# Patient Record
Sex: Male | Born: 1950 | Race: White | Hispanic: No | Marital: Married | Smoking: Former smoker
Health system: Southern US, Community
[De-identification: ages and names within clinical notes are randomized; demographics above are authoritative.]

## PROBLEM LIST (undated history)

## (undated) DIAGNOSIS — G4730 Sleep apnea, unspecified: Secondary | ICD-10-CM

## (undated) DIAGNOSIS — I10 Essential (primary) hypertension: Secondary | ICD-10-CM

## (undated) DIAGNOSIS — J449 Chronic obstructive pulmonary disease, unspecified: Secondary | ICD-10-CM

## (undated) DIAGNOSIS — R011 Cardiac murmur, unspecified: Secondary | ICD-10-CM

## (undated) DIAGNOSIS — K759 Inflammatory liver disease, unspecified: Secondary | ICD-10-CM

## (undated) DIAGNOSIS — N186 End stage renal disease: Secondary | ICD-10-CM

## (undated) DIAGNOSIS — E079 Disorder of thyroid, unspecified: Secondary | ICD-10-CM

## (undated) DIAGNOSIS — C3490 Malignant neoplasm of unspecified part of unspecified bronchus or lung: Secondary | ICD-10-CM

## (undated) DIAGNOSIS — G629 Polyneuropathy, unspecified: Secondary | ICD-10-CM

## (undated) DIAGNOSIS — E039 Hypothyroidism, unspecified: Secondary | ICD-10-CM

## (undated) HISTORY — PX: ANKLE FRACTURE SURGERY: SHX122

## (undated) HISTORY — DX: Essential (primary) hypertension: I10

## (undated) HISTORY — PX: OTHER SURGICAL HISTORY: SHX169

## (undated) HISTORY — PX: VASECTOMY: SHX75

## (undated) HISTORY — DX: Disorder of thyroid, unspecified: E07.9

## (undated) HISTORY — DX: Malignant neoplasm of unspecified part of unspecified bronchus or lung: C34.90

---

## 1994-08-11 LAB — HM COLONOSCOPY: HM COLON: NORMAL

## 2006-01-10 HISTORY — PX: LUNG LOBECTOMY: SHX167

## 2007-03-15 ENCOUNTER — Encounter: Admission: RE | Admit: 2007-03-15 | Discharge: 2007-03-15 | Payer: Self-pay | Admitting: Rheumatology

## 2007-03-16 ENCOUNTER — Ambulatory Visit: Payer: Self-pay | Admitting: Oncology

## 2009-05-19 ENCOUNTER — Encounter: Admission: RE | Admit: 2009-05-19 | Discharge: 2009-05-19 | Payer: Self-pay | Admitting: Family Medicine

## 2009-06-01 ENCOUNTER — Encounter: Admission: RE | Admit: 2009-06-01 | Discharge: 2009-06-01 | Payer: Self-pay | Admitting: Family Medicine

## 2010-01-31 ENCOUNTER — Encounter: Payer: Self-pay | Admitting: Rheumatology

## 2010-01-31 ENCOUNTER — Encounter: Payer: Self-pay | Admitting: Family Medicine

## 2012-12-18 LAB — CBC AND DIFFERENTIAL: WBC: 7.8 10^3/mL

## 2013-01-07 LAB — LIPID PANEL
Cholesterol: 240 mg/dL — AB (ref 0–200)
HDL: 80 mg/dL — AB (ref 35–70)
LDL Cholesterol: 134 mg/dL
Triglycerides: 99 mg/dL (ref 40–160)

## 2013-01-07 LAB — CBC AND DIFFERENTIAL
HEMOGLOBIN: 13.9 g/dL (ref 13.5–17.5)
PLATELETS: 236 10*3/uL (ref 150–399)

## 2013-04-01 LAB — BASIC METABOLIC PANEL
CREATININE: 1.4 mg/dL — AB (ref 0.6–1.3)
GLUCOSE: 101 mg/dL
POTASSIUM: 4.5 mmol/L (ref 3.4–5.3)
Sodium: 137 mmol/L (ref 137–147)

## 2013-04-01 LAB — HEPATIC FUNCTION PANEL
ALK PHOS: 44 U/L (ref 25–125)
ALT: 9 U/L — AB (ref 10–40)
AST: 13 U/L — AB (ref 14–40)

## 2013-04-01 LAB — VITAMIN B12

## 2013-04-01 LAB — TSH: TSH: 0.72 u[IU]/mL (ref 0.41–5.90)

## 2014-08-11 ENCOUNTER — Ambulatory Visit (INDEPENDENT_AMBULATORY_CARE_PROVIDER_SITE_OTHER): Payer: Commercial Managed Care - PPO | Admitting: Family Medicine

## 2014-08-11 ENCOUNTER — Encounter: Payer: Self-pay | Admitting: Family Medicine

## 2014-08-11 VITALS — BP 150/81 | HR 84 | Ht 69.5 in | Wt 204.0 lb

## 2014-08-11 DIAGNOSIS — C3491 Malignant neoplasm of unspecified part of right bronchus or lung: Secondary | ICD-10-CM | POA: Insufficient documentation

## 2014-08-11 DIAGNOSIS — G629 Polyneuropathy, unspecified: Secondary | ICD-10-CM | POA: Diagnosis not present

## 2014-08-11 DIAGNOSIS — E039 Hypothyroidism, unspecified: Secondary | ICD-10-CM

## 2014-08-11 DIAGNOSIS — I10 Essential (primary) hypertension: Secondary | ICD-10-CM

## 2014-08-11 DIAGNOSIS — E785 Hyperlipidemia, unspecified: Secondary | ICD-10-CM

## 2014-08-11 DIAGNOSIS — C3490 Malignant neoplasm of unspecified part of unspecified bronchus or lung: Secondary | ICD-10-CM | POA: Insufficient documentation

## 2014-08-11 MED ORDER — HYDROCODONE-ACETAMINOPHEN 10-325 MG PO TABS: 1.0000 | ORAL_TABLET | Freq: Three times a day (TID) | ORAL | Status: DC | PRN

## 2014-08-11 MED ORDER — LEVOTHYROXINE SODIUM 200 MCG PO TABS: 200.0000 ug | ORAL_TABLET | Freq: Every day | ORAL | Status: DC

## 2014-08-11 MED ORDER — VALSARTAN-HYDROCHLOROTHIAZIDE 160-25 MG PO TABS: 1.0000 | ORAL_TABLET | Freq: Every day | ORAL | Status: DC

## 2014-08-11 MED ORDER — DICLOFENAC SODIUM 75 MG PO TBEC: DELAYED_RELEASE_TABLET | ORAL | Status: DC

## 2014-08-11 NOTE — Progress Notes (Signed)
CC: Walter Wilkerson is a 64 y.o. male is here for Establish Care and Medication Refill   Subjective: HPI:  Very pleasant 64 year old here to establish care father of [name], husband of [name].  In 2008 he had right-sided non-small cell lung cancer that resulted in two thirds of his right lung removed along with chemotherapy. He denies any chronic respiratory complaints since that time. Ever since having chemotherapy he suffers from chronic joint pain. Symptoms are greatly improved and overall nonexistent provided he takes Norco 3 times a day. For breakthrough pain he also takes diclofenac. Denies any swelling redness or warmth of any joints.  History essential hypertension, recently started on valsartan originally was only on HCTZ. He's been on this for about 6 months now. Reports of outside blood pressures have been normal over the last 3 months. 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 He describes it as burning joint pain that is not currently interfering with quality of life given his current pain medication regimen.  History of hyperlipidemia area he believes cholesterol was checked 3 months ago and was normal.   Review of Systems - General ROS: negative for - chills, fever, night sweats, weight gain or weight loss Ophthalmic ROS: negative for - decreased vision Psychological ROS: negative for - anxiety or depression ENT ROS: negative for - hearing change, nasal congestion, tinnitus or allergies Hematological and Lymphatic ROS: negative for - bleeding problems, bruising or swollen lymph nodes Breast ROS: negative Respiratory ROS: no cough, shortness of breath, or wheezing Cardiovascular ROS: no chest pain or dyspnea on exertion Gastrointestinal ROS: no abdominal pain, change in bowel habits, or black or bloody stools Genito-Urinary ROS: negative for - genital discharge, genital ulcers, incontinence or abnormal bleeding  from genitals Musculoskeletal ROS: negative for - muscle pain Neurological ROS: negative for - headaches or memory loss Dermatological ROS: negative for lumps, mole changes, rash and skin lesion changes  Past Medical History  Diagnosis Date  . Hypertension   . Thyroid disease   . Lung cancer     Past Surgical History  Procedure Laterality Date  . Vasectomy     Family History  Problem Relation Age of Onset  . Heart attack Father   . Diabetes      grandmother   . Hyperlipidemia Father   . Hypertension Father     History   Social History  . Marital Status: Married    Spouse Name: N/A  . Number of Children: N/A  . Years of Education: N/A   Occupational History  . Not on file.   Social History Main Topics  . Smoking status: Former Smoker    Quit date: 08/10/2004  . Smokeless tobacco: Not on file  . Alcohol Use: 3.0 oz/week    5 Standard drinks or equivalent per week  . Drug Use: No  . Sexual Activity:    Partners: Female   Other Topics Concern  . Not on file   Social History Narrative  . No narrative on file     Objective: BP 150/81 mmHg  Pulse 84  Ht 5' 9.5" (1.765 m)  Wt 204 lb (92.534 kg)  BMI 29.70 kg/m2  General: Alert and Oriented, No Acute Distress HEENT: Pupils equal, round, reactive to light. Conjunctivae clear.  Moist membranes pharynx unremarkable Lungs: Clear to auscultation bilaterally, no wheezing/ronchi/rales.  Comfortable work of breathing. Good air movement. Cardiac: Regular rate and rhythm. Normal S1/S2.  No murmurs, rubs, nor  gallops.   Extremities: No peripheral edema.  Strong peripheral pulses.  Mental Status: No depression, anxiety, nor agitation. Skin: Warm and dry.  Assessment & Plan: Cori was seen today for establish care and medication refill.  Diagnoses and all orders for this visit:  Hypothyroidism, unspecified hypothyroidism type  Essential hypertension  Hyperlipidemia  Peripheral neuropathy  Non-small cell lung  cancer, unspecified laterality  Other orders -     valsartan-hydrochlorothiazide (DIOVAN-HCT) 160-25 MG per tablet; Take 1 tablet by mouth daily. -     levothyroxine (SYNTHROID, LEVOTHROID) 200 MCG tablet; Take 1 tablet (200 mcg total) by mouth daily. -     HYDROcodone-acetaminophen (NORCO) 10-325 MG per tablet; Take 1 tablet by mouth every 8 (eight) hours as needed. -     diclofenac (VOLTAREN) 75 MG EC tablet; TAKE ONE TABLET BY MOUTH 2 TIMES A DAY   Hypothyroidism: Clinical controlled and TSH was reportedly normal 3 months ago therefore continue levothyroxine will recheck in 3 months Essential hypertension: Uncontrolled chronic condition, Continue valsartan-HCTZ, discussed dietary and exercise interventions to help lower blood pressure. Will adjust medications at future visits if this remains elevated. Hyperlipidemia: Repeat lipid panel in 3 months Non-small cell lung cancer: Appears to be in remission, requesting outside records of most recent x-ray he believes it been within the last year Peripheral neuropathy and arthritis: Controlled with hydrocodone and diclofenac.  Return in about 3 months (around 11/11/2014) for Blood work and HTN.

## 2014-10-29 ENCOUNTER — Telehealth: Payer: Self-pay

## 2014-10-29 DIAGNOSIS — C3490 Malignant neoplasm of unspecified part of unspecified bronchus or lung: Secondary | ICD-10-CM

## 2014-10-29 DIAGNOSIS — E039 Hypothyroidism, unspecified: Secondary | ICD-10-CM

## 2014-10-29 DIAGNOSIS — I10 Essential (primary) hypertension: Secondary | ICD-10-CM

## 2014-10-29 DIAGNOSIS — E785 Hyperlipidemia, unspecified: Secondary | ICD-10-CM

## 2014-10-29 NOTE — Telephone Encounter (Signed)
Patient would like lab work before next appointment. What labs would you like patient to have?

## 2014-10-29 NOTE — Telephone Encounter (Signed)
[place], Fasting lab orders are placed in your in box.

## 2014-10-30 NOTE — Telephone Encounter (Signed)
Patient advised.

## 2014-10-31 ENCOUNTER — Encounter: Payer: Self-pay | Admitting: Family Medicine

## 2014-11-04 LAB — COMPREHENSIVE METABOLIC PANEL
ALBUMIN: 4 g/dL (ref 3.6–5.1)
ALK PHOS: 41 U/L (ref 40–115)
ALT: 14 U/L (ref 9–46)
AST: 14 U/L (ref 10–35)
BUN: 17 mg/dL (ref 7–25)
CHLORIDE: 101 mmol/L (ref 98–110)
CO2: 25 mmol/L (ref 20–31)
CREATININE: 0.97 mg/dL (ref 0.70–1.25)
Calcium: 9.5 mg/dL (ref 8.6–10.3)
Glucose, Bld: 91 mg/dL (ref 65–99)
POTASSIUM: 4.2 mmol/L (ref 3.5–5.3)
SODIUM: 136 mmol/L (ref 135–146)
TOTAL PROTEIN: 7.3 g/dL (ref 6.1–8.1)
Total Bilirubin: 0.5 mg/dL (ref 0.2–1.2)

## 2014-11-04 LAB — CBC
HEMATOCRIT: 40.5 % (ref 39.0–52.0)
Hemoglobin: 13.1 g/dL (ref 13.0–17.0)
MCH: 30 pg (ref 26.0–34.0)
MCHC: 32.3 g/dL (ref 30.0–36.0)
MCV: 92.9 fL (ref 78.0–100.0)
MPV: 10.7 fL (ref 8.6–12.4)
PLATELETS: 233 10*3/uL (ref 150–400)
RBC: 4.36 MIL/uL (ref 4.22–5.81)
RDW: 14.6 % (ref 11.5–15.5)
WBC: 6.1 10*3/uL (ref 4.0–10.5)

## 2014-11-04 LAB — LIPID PANEL
Cholesterol: 225 mg/dL — ABNORMAL HIGH (ref 125–200)
HDL: 93 mg/dL (ref >=40)
LDL Cholesterol: 118 mg/dL (ref <130)
TRIGLYCERIDES: 68 mg/dL (ref <150)
Total CHOL/HDL Ratio: 2.4 Ratio (ref <=5.0)
VLDL: 14 mg/dL (ref <30)

## 2014-11-04 LAB — TSH: TSH: 0.889 u[IU]/mL (ref 0.350–4.500)

## 2014-11-11 ENCOUNTER — Encounter: Payer: Self-pay | Admitting: Family Medicine

## 2014-11-11 ENCOUNTER — Ambulatory Visit (INDEPENDENT_AMBULATORY_CARE_PROVIDER_SITE_OTHER): Payer: Commercial Managed Care - PPO | Admitting: Family Medicine

## 2014-11-11 VITALS — BP 138/87 | HR 79 | Temp 98.0°F | Wt 209.0 lb

## 2014-11-11 DIAGNOSIS — M79643 Pain in unspecified hand: Secondary | ICD-10-CM | POA: Diagnosis not present

## 2014-11-11 DIAGNOSIS — Z23 Encounter for immunization: Secondary | ICD-10-CM | POA: Diagnosis not present

## 2014-11-11 DIAGNOSIS — E039 Hypothyroidism, unspecified: Secondary | ICD-10-CM

## 2014-11-11 DIAGNOSIS — E785 Hyperlipidemia, unspecified: Secondary | ICD-10-CM

## 2014-11-11 DIAGNOSIS — I10 Essential (primary) hypertension: Secondary | ICD-10-CM | POA: Diagnosis not present

## 2014-11-11 MED ORDER — LEVOTHYROXINE SODIUM 200 MCG PO TABS: 200.0000 ug | ORAL_TABLET | Freq: Every day | ORAL | Status: DC

## 2014-11-11 MED ORDER — VALSARTAN-HYDROCHLOROTHIAZIDE 160-25 MG PO TABS: 1.0000 | ORAL_TABLET | Freq: Every day | ORAL | Status: DC

## 2014-11-11 MED ORDER — HYDROCODONE-ACETAMINOPHEN 10-325 MG PO TABS: 1.0000 | ORAL_TABLET | Freq: Three times a day (TID) | ORAL | Status: DC | PRN

## 2014-11-11 NOTE — Progress Notes (Signed)
CC: Walter Wilkerson is a 64 y.o. male is here for Follow-up   Subjective: HPI:  Follow-up essential hypertension: Since starting combination valsartan and hydrochlorothiazide he has not experienced any side effects. No outside blood pressures to report. No chest pain shortness of breath orthopnea nor peripheral edema  Follow-up hyperlipidemia: He is actively trying to eat consciously and avoid saturated fats.  Follow-up hypothyroidism: Denies any unintentional weight loss or gain. Taking levothyroxine daily without any noted side effects.  Denies any hair or skin changes  Continues to have daily and nightly hand pain is localized from the wrist distally. It comes and goes without warning. He's had this ever since he had chemotherapy. Symptoms are greatly improved for a few hours after taking hydrocodone. Nothing else seems to make better or worse. He's tried a variety of anti-inflammatories and Lyrica none of which seem to help at all.   Review Of Systems Outlined In HPI  Past Medical History  Diagnosis Date  . Hypertension   . Thyroid disease   . Lung cancer [facility])     Past Surgical History  Procedure Laterality Date  . Vasectomy     Family History  Problem Relation Age of Onset  . Heart attack Father   . Diabetes      grandmother   . Hyperlipidemia Father   . Hypertension Father     Social History   Social History  . Marital Status: Married    Spouse Name: N/A  . Number of Children: N/A  . Years of Education: N/A   Occupational History  . Not on file.   Social History Main Topics  . Smoking status: Former Smoker    Quit date: 08/10/2004  . Smokeless tobacco: Not on file  . Alcohol Use: 3.0 oz/week    5 Standard drinks or equivalent per week  . Drug Use: No  . Sexual Activity:    Partners: Female   Other Topics Concern  . Not on file   Social History Narrative     Objective: BP 138/87 mmHg  Pulse 79  Temp(Src) 98 F (36.7 C) (Oral)  Wt 209 lb (94.802  kg)  General: Alert and Oriented, No Acute Distress HEENT: Pupils equal, round, reactive to light. Conjunctivae clear.  Moist mucous membranes Lungs: Clear to auscultation bilaterally, no wheezing/ronchi/rales.  Comfortable work of breathing. Good air movement. Cardiac: Regular rate and rhythm. Normal S1/S2.  No murmurs, rubs, nor gallops.   Extremities: No peripheral edema.  Strong peripheral pulses. No swelling redness or warmth of any of the joints in his hands Mental Status: No depression, anxiety, nor agitation. Skin: Warm and dry.  Assessment & Plan: Walter Wilkerson was seen today for follow-up.  Diagnoses and all orders for this visit:  Essential hypertension -     valsartan-hydrochlorothiazide (DIOVAN-HCT) 160-25 MG tablet; Take 1 tablet by mouth daily.  Hyperlipidemia  Hypothyroidism, unspecified hypothyroidism type  Pain of hand, unspecified laterality -     HYDROcodone-acetaminophen (NORCO) 10-325 MG tablet; Take 1 tablet by mouth every 8 (eight) hours as needed.  Other orders -     levothyroxine (SYNTHROID, LEVOTHROID) 200 MCG tablet; Take 1 tablet (200 mcg total) by mouth daily.   Essential hypertension: Controlled with Diovan HCT renal function last week normal Hyperlipidemia: Controlled with acceptable LDL cholesterol Hypothyroidism: Controlled with levothyroxine. Continued hand pain: Controlled with Norco.  Return in about 3 months (around 02/11/2015) for BP F/U.

## 2014-12-08 ENCOUNTER — Telehealth: Payer: Self-pay

## 2014-12-08 ENCOUNTER — Other Ambulatory Visit: Payer: Self-pay

## 2014-12-08 DIAGNOSIS — M79643 Pain in unspecified hand: Secondary | ICD-10-CM

## 2014-12-08 MED ORDER — HYDROCODONE-ACETAMINOPHEN 10-325 MG PO TABS: 1.0000 | ORAL_TABLET | Freq: Three times a day (TID) | ORAL | Status: DC | PRN

## 2014-12-12 NOTE — Telephone Encounter (Signed)
[place], I got a blank phone note timed at 8:30 am yesterday morning, do you know that this is in regards to?

## 2014-12-12 NOTE — Telephone Encounter (Signed)
error 

## 2015-01-06 ENCOUNTER — Other Ambulatory Visit: Payer: Self-pay

## 2015-01-06 DIAGNOSIS — M79643 Pain in unspecified hand: Secondary | ICD-10-CM

## 2015-01-06 MED ORDER — HYDROCODONE-ACETAMINOPHEN 10-325 MG PO TABS: 1.0000 | ORAL_TABLET | Freq: Three times a day (TID) | ORAL | Status: DC | PRN

## 2015-01-06 NOTE — Telephone Encounter (Signed)
This medication is being filled by Dr. Darene Lamer while Dr. Ileene Rubens is out of the office.

## 2015-01-08 ENCOUNTER — Encounter: Payer: Self-pay | Admitting: Family Medicine

## 2015-01-08 ENCOUNTER — Ambulatory Visit (INDEPENDENT_AMBULATORY_CARE_PROVIDER_SITE_OTHER): Payer: Commercial Managed Care - PPO | Admitting: Family Medicine

## 2015-01-08 VITALS — BP 159/73 | HR 72 | Temp 98.1°F | Wt 207.0 lb

## 2015-01-08 DIAGNOSIS — J209 Acute bronchitis, unspecified: Secondary | ICD-10-CM | POA: Diagnosis not present

## 2015-01-08 MED ORDER — IPRATROPIUM BROMIDE 0.06 % NA SOLN: 2.0000 | Freq: Four times a day (QID) | NASAL | Status: DC

## 2015-01-08 MED ORDER — PREDNISONE 10 MG PO TABS: 30.0000 mg | ORAL_TABLET | Freq: Every day | ORAL | Status: DC

## 2015-01-08 MED ORDER — AZITHROMYCIN 250 MG PO TABS: 250.0000 mg | ORAL_TABLET | Freq: Every day | ORAL | Status: DC

## 2015-01-08 NOTE — Assessment & Plan Note (Signed)
Likely viral. Treat with prednisone and Atrovent nasal spray. X-ray pending. Use azithromycin if not improved.

## 2015-01-08 NOTE — Patient Instructions (Signed)
Thank you for coming in today. Take prednisone and use atrovent nasal spray.  Use azithromycin antibiotic if not better.  Get xray today.  Call or go to the emergency room if you get worse, have trouble breathing, have chest pains, or palpitations.   Acute Bronchitis Bronchitis is inflammation of the airways that extend from the windpipe into the lungs (bronchi). The inflammation often causes mucus to develop. This leads to a cough, which is the most common symptom of bronchitis.  In acute bronchitis, the condition usually develops suddenly and goes away over time, usually in a couple weeks. Smoking, allergies, and asthma can make bronchitis worse. Repeated episodes of bronchitis may cause further lung problems.  CAUSES Acute bronchitis is most often caused by the same virus that causes a cold. The virus can spread from person to person (contagious) through coughing, sneezing, and touching contaminated objects. SIGNS AND SYMPTOMS   Cough.   Fever.   Coughing up mucus.   Body aches.   Chest congestion.   Chills.   Shortness of breath.   Sore throat.  DIAGNOSIS  Acute bronchitis is usually diagnosed through a physical exam. Your health care provider will also ask you questions about your medical history. Tests, such as chest X-rays, are sometimes done to rule out other conditions.  TREATMENT  Acute bronchitis usually goes away in a couple weeks. Oftentimes, no medical treatment is necessary. Medicines are sometimes given for relief of fever or cough. Antibiotic medicines are usually not needed but may be prescribed in certain situations. In some cases, an inhaler may be recommended to help reduce shortness of breath and control the cough. A cool mist vaporizer may also be used to help thin bronchial secretions and make it easier to clear the chest.  HOME CARE INSTRUCTIONS  Get plenty of rest.   Drink enough fluids to keep your urine clear or pale yellow (unless you have a  medical condition that requires fluid restriction). Increasing fluids may help thin your respiratory secretions (sputum) and reduce chest congestion, and it will prevent dehydration.   Take medicines only as directed by your health care provider.  If you were prescribed an antibiotic medicine, finish it all even if you start to feel better.  Avoid smoking and secondhand smoke. Exposure to cigarette smoke or irritating chemicals will make bronchitis worse. If you are a smoker, consider using nicotine gum or skin patches to help control withdrawal symptoms. Quitting smoking will help your lungs heal faster.   Reduce the chances of another bout of acute bronchitis by washing your hands frequently, avoiding people with cold symptoms, and trying not to touch your hands to your mouth, nose, or eyes.   Keep all follow-up visits as directed by your health care provider.  SEEK MEDICAL CARE IF: Your symptoms do not improve after 1 week of treatment.  SEEK IMMEDIATE MEDICAL CARE IF:  You develop an increased fever or chills.   You have chest pain.   You have severe shortness of breath.  You have bloody sputum.   You develop dehydration.  You faint or repeatedly feel like you are going to pass out.  You develop repeated vomiting.  You develop a severe headache. MAKE SURE YOU:   Understand these instructions.  Will watch your condition.  Will get help right away if you are not doing well or get worse.   This information is not intended to replace advice given to you by your health care provider. Make sure you discuss  any questions you have with your health care provider.   Document Released: 02/04/2004 Document Revised: 01/17/2014 Document Reviewed: 06/19/2012 Elsevier Interactive Patient Education [employer].

## 2015-01-08 NOTE — Progress Notes (Signed)
       Walter Wilkerson is a 64 y.o. male who presents to [facility]: [facility] today for cough congestion and slight wheezing. Symptoms present for 2 days. This is associated with a sore throat and postnasal drainage. He notes multiple sick contacts. He denies any significant shortness of breath or chest tightness. He notes he has a history of lung cancer status post right lobectomy. He's been in remission now for 8 years. He denies any history of COPD. He's tried some over-the-counter medicines which have helped a bit.   Past Medical History  Diagnosis Date  . Hypertension   . Thyroid disease   . Lung cancer [facility])    Past Surgical History  Procedure Laterality Date  . Vasectomy     Social History  Substance Use Topics  . Smoking status: Former Smoker    Quit date: 08/10/2004  . Smokeless tobacco: Not on file  . Alcohol Use: 3.0 oz/week    5 Standard drinks or equivalent per week   family history includes Heart attack in his father; Hyperlipidemia in his father; Hypertension in his father.  ROS as above Medications: Current Outpatient Prescriptions  Medication Sig Dispense Refill  . Cyanocobalamin (B-12 PO) Take by mouth.    . diclofenac (VOLTAREN) 75 MG EC tablet TAKE ONE TABLET BY MOUTH 2 TIMES A DAY 180 tablet 1  . HYDROcodone-acetaminophen (NORCO) 10-325 MG tablet Take 1 tablet by mouth every 8 (eight) hours as needed. 90 tablet 0  . levothyroxine (SYNTHROID, LEVOTHROID) 200 MCG tablet Take 1 tablet (200 mcg total) by mouth daily. 90 tablet 1  . valsartan-hydrochlorothiazide (DIOVAN-HCT) 160-25 MG tablet Take 1 tablet by mouth daily. 90 tablet 1  . azithromycin (ZITHROMAX) 250 MG tablet Take 1 tablet (250 mg total) by mouth daily. Take first 2 tablets together, then 1 every day until finished. 6 tablet 0  . ipratropium (ATROVENT) 0.06 % nasal spray Place 2 sprays into both nostrils 4 (four)  times daily. 15 mL 1  . predniSONE (DELTASONE) 10 MG tablet Take 3 tablets (30 mg total) by mouth daily. 15 tablet 0   No current facility-administered medications for this visit.   No Known Allergies   Exam:  BP 159/73 mmHg  Pulse 72  Temp(Src) 98.1 F (36.7 C) (Oral)  Wt 207 lb (93.895 kg)  SpO2 100% Gen: Well NAD HEENT: EOMI,  MMM clear nasal discharge. Posterior pharynx with cobblestoning. Normal tympanic membranes bilaterally. Lungs: Normal work of breathing. Decreased breath sounds right lower lobe. Normal otherwise. Heart: RRR no MRG Abd: NABS, Soft. Nondistended, Nontender Exts: Brisk capillary refill, warm and well perfused.   Chest x-ray pending  No results found for this or any previous visit (from the past 24 hour(s)). No results found.   Please see individual assessment and plan sections.

## 2015-02-05 ENCOUNTER — Other Ambulatory Visit: Payer: Self-pay

## 2015-02-05 DIAGNOSIS — M79643 Pain in unspecified hand: Secondary | ICD-10-CM

## 2015-02-05 MED ORDER — HYDROCODONE-ACETAMINOPHEN 10-325 MG PO TABS
1.0000 | ORAL_TABLET | Freq: Three times a day (TID) | ORAL | Status: DC | PRN
Start: 2015-02-05 — End: 2015-02-11

## 2015-02-11 ENCOUNTER — Ambulatory Visit (INDEPENDENT_AMBULATORY_CARE_PROVIDER_SITE_OTHER): Payer: Commercial Managed Care - PPO

## 2015-02-11 ENCOUNTER — Encounter: Payer: Self-pay | Admitting: Family Medicine

## 2015-02-11 ENCOUNTER — Ambulatory Visit (INDEPENDENT_AMBULATORY_CARE_PROVIDER_SITE_OTHER): Payer: Commercial Managed Care - PPO | Admitting: Family Medicine

## 2015-02-11 VITALS — BP 140/76 | HR 79 | Wt 210.0 lb

## 2015-02-11 DIAGNOSIS — M79643 Pain in unspecified hand: Secondary | ICD-10-CM

## 2015-02-11 DIAGNOSIS — R079 Chest pain, unspecified: Secondary | ICD-10-CM

## 2015-02-11 DIAGNOSIS — C3491 Malignant neoplasm of unspecified part of right bronchus or lung: Secondary | ICD-10-CM

## 2015-02-11 DIAGNOSIS — J90 Pleural effusion, not elsewhere classified: Secondary | ICD-10-CM

## 2015-02-11 DIAGNOSIS — I10 Essential (primary) hypertension: Secondary | ICD-10-CM | POA: Diagnosis not present

## 2015-02-11 NOTE — Progress Notes (Signed)
CC: Walter Wilkerson is a 65 y.o. male is here for Hypertension   Subjective: HPI:      follow-up essential hypertension: Taking valsartan-hydrochlorothiazide on a daily basis with no outside blood pressures to report. Denies chest pain shortness of breath orthopnea nor peripheral edema.  Follow-up bilateral hand pain: He is taking hydrocodone 3 times a day. Most days of the week he's noticed that the pain will return in a few hours after taking hydrocodone. He tells me it doesn't seem to last as long as it used to over the past years. He notices that he has been much more active with his hands lately and this is coincided with worsening pain. He denies any swelling redness or warmth of the hands.  Follow-up lung cancer: Ever since he had bronchitis last month he's had pain in the right chest when lying down. He's no longer having any shortness of breath or sputum production. There is no exertional component to his pain. It's nonradiating and described as sharp. He denies any night sweats, unintentional weight loss, fevers, chills. He usually gets a chest x-ray once a year and has been well over a year since his last one   Past Medical History  Diagnosis Date  . Hypertension   . Thyroid disease   . Lung cancer [facility])     Past Surgical History  Procedure Laterality Date  . Vasectomy     Family History  Problem Relation Age of Onset  . Heart attack Father   . Diabetes      grandmother   . Hyperlipidemia Father   . Hypertension Father     Social History   Social History  . Marital Status: Married    Spouse Name: N/A  . Number of Children: N/A  . Years of Education: N/A   Occupational History  . Not on file.   Social History Main Topics  . Smoking status: Former Smoker    Quit date: 08/10/2004  . Smokeless tobacco: Not on file  . Alcohol Use: 3.0 oz/week    5 Standard drinks or equivalent per week  . Drug Use: No  . Sexual Activity:    Partners: Female   Other Topics  Concern  . Not on file   Social History Narrative     Objective: BP 140/76 mmHg  Pulse 79  Wt 210 lb (95.255 kg)  General: Alert and Oriented, No Acute Distress HEENT: Pupils equal, round, reactive to light. Conjunctivae clear Moist mucous membranes pharynx unremarkable Clear to auscultation bilaterally, no wheezing/ronchi/rales.  Comfortable work of breathing. Good air movement. Cardiac: Regular rate and rhythm. Normal S1/S2.  No murmurs, rubs, nor gallops.   Extremities: No peripheral edema.  Strong peripheral pulses.  Mental Status: No depression, anxiety, nor agitation. Skin: Warm and dry.  Assessment & Plan: Walter Wilkerson was seen today for hypertension.  Diagnoses and all orders for this visit:  Essential hypertension  Pain of hand, unspecified laterality  Non-small cell lung cancer, right ([place]) -     DG Chest 2 View; Future  Chest pain, unspecified chest pain type -     DG Chest 2 View; Future   Essential hypertension: Borderline control, continue valsartan and hydrochlorothiazide.   bilateral hand pain ever since chemotherapy. Uncontrolled on current hydrocodone regimen, I given him permission to take a dose of hydrocodone every 6-8 hours. .No prescription today, he'll use what he has at home.   history of lung cancer with chest discomfort. This likely due to pleurisy however I  did a chest x-ray to make sure that there is no recurrence of his cancer.  Return for physical in 3 months.

## 2015-02-24 ENCOUNTER — Encounter: Payer: Self-pay | Admitting: Family Medicine

## 2015-02-24 ENCOUNTER — Ambulatory Visit (INDEPENDENT_AMBULATORY_CARE_PROVIDER_SITE_OTHER): Payer: Commercial Managed Care - PPO | Admitting: Family Medicine

## 2015-02-24 VITALS — BP 129/85 | HR 79 | Wt 204.0 lb

## 2015-02-24 DIAGNOSIS — M7542 Impingement syndrome of left shoulder: Secondary | ICD-10-CM

## 2015-02-24 DIAGNOSIS — M25819 Other specified joint disorders, unspecified shoulder: Secondary | ICD-10-CM | POA: Insufficient documentation

## 2015-02-24 DIAGNOSIS — M7540 Impingement syndrome of unspecified shoulder: Secondary | ICD-10-CM | POA: Insufficient documentation

## 2015-02-24 NOTE — Assessment & Plan Note (Signed)
Pain likely due to subacromial bursitis or impingement. Pain was marginally to partially improved with injection. This may be due to coexisting AC joint pain l pain. Plan for home exercises and recheck in a few weeks. At that time is not better we consider acromioclavicular or glenohumeral injection.

## 2015-02-24 NOTE — Progress Notes (Signed)
   Subjective:    I'm seeing this patient as a consultation for:  Dr. Ileene Rubens  CC: Left shoulder pain  HPI: Patient is a two-week history of left shoulder pain. Pain is moderate to severe. Pain is located in the anterior and lateral upper arm. Pain is worse with overhead motion reaching back and at night. He denies any specific injury. He's tried some over-the-counter medicines have helped. No fevers chills nausea vomiting or diarrhea.  Past medical history, Surgical history, Family history not pertinant except as noted below, Social history, Allergies, and medications have been entered into the medical record, reviewed, and no changes needed.   Review of Systems: No headache, visual changes, nausea, vomiting, diarrhea, constipation, dizziness, abdominal pain, skin rash, fevers, chills, night sweats, weight loss, swollen lymph nodes, body aches, joint swelling, muscle aches, chest pain, shortness of breath, mood changes, visual or auditory hallucinations.   Objective:    Filed Vitals:   02/24/15 1348  BP: 129/85  Pulse: 79   General: Well Developed, well nourished, and in no acute distress.  Neuro/Psych: Alert and oriented x3, extra-ocular muscles intact, able to move all 4 extremities, sensation grossly intact. Skin: Warm and dry, no rashes noted.  Respiratory: Not using accessory muscles, speaking in full sentences, trachea midline.  Cardiovascular: Pulses palpable, no extremity edema. Abdomen: Does not appear distended. MSK: Left shoulder is normal-appearing with no skin change. Nontender. Range of motion limited in abduction to about 70. External rotation limited to 70 beyond neutral position, internal rotation limited to the lumbar spine. Significantly positive Hawkins and Neer's test. Positive empty can test. Intact external rotation strength. Mildly impaired internal rotation strength testing. Impaired abduction strength testing due to pain. Negative Yergason's and speeds  test. Pulses Refill sensation intact distally.  Procedure: Real-time Ultrasound Guided Injection of left subacromial bursa  Device: GE Logiq E  Images permanently stored and available for review in the ultrasound unit. Verbal informed consent obtained. Discussed risks and benefits of procedure. Warned about infection bleeding damage to structures skin hypopigmentation and fat atrophy among others. Patient expresses understanding and agreement Time-out conducted.  Noted no overlying erythema, induration, or other signs of local infection.  Skin prepped in a sterile fashion.  Local anesthesia: Topical Ethyl chloride.  With sterile technique and under real time ultrasound guidance: 40 mg of Kenalog and 4 mL of Marcaine injected easily.  Completed without difficulty  Pain partially resolved suggesting accurate placement of the medication.  Advised to call if fevers/chills, erythema, induration, drainage, or persistent bleeding.  Images permanently stored and available for review in the ultrasound unit.  Impression: Technically successful ultrasound guided injection.    No results found for this or any previous visit (from the past 24 hour(s)). No results found.  Impression and Recommendations:   This case required medical decision making of moderate complexity.

## 2015-02-24 NOTE — Patient Instructions (Signed)
Thank you for coming in today. Call or go to the ER if you develop a large red swollen joint with extreme pain or oozing puss.   Return in 2-4 weeks if not better.   Impingement Syndrome, Rotator Cuff, Bursitis With Rehab Impingement syndrome is a condition that involves inflammation of the tendons of the rotator cuff and the subacromial bursa, that causes pain in the shoulder. The rotator cuff consists of four tendons and muscles that control much of the shoulder and upper arm function. The subacromial bursa is a fluid filled sac that helps reduce friction between the rotator cuff and one of the bones of the shoulder (acromion). Impingement syndrome is usually an overuse injury that causes swelling of the bursa (bursitis), swelling of the tendon (tendonitis), and/or a tear of the tendon (strain). Strains are classified into three categories. Grade 1 strains cause pain, but the tendon is not lengthened. Grade 2 strains include a lengthened ligament, due to the ligament being stretched or partially ruptured. With grade 2 strains there is still function, although the function may be decreased. Grade 3 strains include a complete tear of the tendon or muscle, and function is usually impaired. SYMPTOMS   Pain around the shoulder, often at the outer portion of the upper arm.  Pain that gets worse with shoulder function, especially when reaching overhead or lifting.  Sometimes, aching when not using the arm.  Pain that wakes you up at night.  Sometimes, tenderness, swelling, warmth, or redness over the affected area.  Loss of strength.  Limited motion of the shoulder, especially reaching behind the back (to the back pocket or to unhook bra) or across your body.  Crackling sound (crepitation) when moving the arm.  Biceps tendon pain and inflammation (in the front of the shoulder). Worse when bending the elbow or lifting. CAUSES  Impingement syndrome is often an overuse injury, in which chronic  (repetitive) motions cause the tendons or bursa to become inflamed. A strain occurs when a force is paced on the tendon or muscle that is greater than it can withstand. Common mechanisms of injury include: Stress from sudden increase in duration, frequency, or intensity of training.  Direct hit (trauma) to the shoulder.  Aging, erosion of the tendon with normal use.  Bony bump on shoulder (acromial spur). RISK INCREASES WITH:  Contact sports (football, wrestling, boxing).  Throwing sports (baseball, tennis, volleyball).  Weightlifting and bodybuilding.  Heavy labor.  Previous injury to the rotator cuff, including impingement.  Poor shoulder strength and flexibility.  Failure to warm up properly before activity.  Inadequate protective equipment.  Old age.  Bony bump on shoulder (acromial spur). PREVENTION   Warm up and stretch properly before activity.  Allow for adequate recovery between workouts.  Maintain physical fitness:  Strength, flexibility, and endurance.  Cardiovascular fitness.  Learn and use proper exercise technique. PROGNOSIS  If treated properly, impingement syndrome usually goes away within 6 weeks. Sometimes surgery is required.  RELATED COMPLICATIONS   Longer healing time if not properly treated, or if not given enough time to heal.  Recurring symptoms, that result in a chronic condition.  Shoulder stiffness, frozen shoulder, or loss of motion.  Rotator cuff tendon tear.  Recurring symptoms, especially if activity is resumed too soon, with overuse, with a direct blow, or when using poor technique. TREATMENT  Treatment first involves the use of ice and medicine, to reduce pain and inflammation. The use of strengthening and stretching exercises may help reduce pain with  activity. These exercises may be performed at home or with a therapist. If non-surgical treatment is unsuccessful after more than 6 months, surgery may be advised. After surgery  and rehabilitation, activity is usually possible in 3 months.  MEDICATION  If pain medicine is needed, nonsteroidal anti-inflammatory medicines (aspirin and ibuprofen), or other minor pain relievers (acetaminophen), are often advised.  Do not take pain medicine for 7 days before surgery.  Prescription pain relievers may be given, if your caregiver thinks they are needed. Use only as directed and only as much as you need.  Corticosteroid injections may be given by your caregiver. These injections should be reserved for the most serious cases, because they may only be given a certain number of times. HEAT AND COLD  Cold treatment (icing) should be applied for 10 to 15 minutes every 2 to 3 hours for inflammation and pain, and immediately after activity that aggravates your symptoms. Use ice packs or an ice massage.  Heat treatment may be used before performing stretching and strengthening activities prescribed by your caregiver, physical therapist, or athletic trainer. Use a heat pack or a warm water soak. SEEK MEDICAL CARE IF:   Symptoms get worse or do not improve in 4 to 6 weeks, despite treatment.  New, unexplained symptoms develop. (Drugs used in treatment may produce side effects.) EXERCISES  RANGE OF MOTION (ROM) AND STRETCHING EXERCISES - Impingement Syndrome (Rotator Cuff  Tendinitis, Bursitis) These exercises may help you when beginning to rehabilitate your injury. Your symptoms may go away with or without further involvement from your physician, physical therapist or athletic trainer. While completing these exercises, remember:   Restoring tissue flexibility helps normal motion to return to the joints. This allows healthier, less painful movement and activity.  An effective stretch should be held for at least 30 seconds.  A stretch should never be painful. You should only feel a gentle lengthening or release in the stretched tissue. STRETCH - Flexion, Standing  Stand with good  posture. With an underhand grip on your right / left hand, and an overhand grip on the opposite hand, grasp a broomstick or cane so that your hands are a little more than shoulder width apart.  Keeping your right / left elbow straight and shoulder muscles relaxed, push the stick with your opposite hand, to raise your right / left arm in front of your body and then overhead. Raise your arm until you feel a stretch in your right / left shoulder, but before you have increased shoulder pain.  Try to avoid shrugging your right / left shoulder as your arm rises, by keeping your shoulder blade tucked down and toward your mid-back spine. Hold for __________ seconds.  Slowly return to the starting position. Repeat __________ times. Complete this exercise __________ times per day. STRETCH - Abduction, Supine  Lie on your back. With an underhand grip on your right / left hand and an overhand grip on the opposite hand, grasp a broomstick or cane so that your hands are a little more than shoulder width apart.  Keeping your right / left elbow straight and your shoulder muscles relaxed, push the stick with your opposite hand, to raise your right / left arm out to the side of your body and then overhead. Raise your arm until you feel a stretch in your right / left shoulder, but before you have increased shoulder pain.  Try to avoid shrugging your right / left shoulder as your arm rises, by keeping your shoulder  blade tucked down and toward your mid-back spine. Hold for __________ seconds.  Slowly return to the starting position. Repeat __________ times. Complete this exercise __________ times per day. ROM - Flexion, Active-Assisted  Lie on your back. You may bend your knees for comfort.  Grasp a broomstick or cane so your hands are about shoulder width apart. Your right / left hand should grip the end of the stick, so that your hand is positioned "thumbs-up," as if you were about to shake hands.  Using your  healthy arm to lead, raise your right / left arm overhead, until you feel a gentle stretch in your shoulder. Hold for __________ seconds.  Use the stick to assist in returning your right / left arm to its starting position. Repeat __________ times. Complete this exercise __________ times per day.  ROM - Internal Rotation, Supine   Lie on your back on a firm surface. Place your right / left elbow about 60 degrees away from your side. Elevate your elbow with a folded towel, so that the elbow and shoulder are the same height.  Using a broomstick or cane and your strong arm, pull your right / left hand toward your body until you feel a gentle stretch, but no increase in your shoulder pain. Keep your shoulder and elbow in place throughout the exercise.  Hold for __________ seconds. Slowly return to the starting position. Repeat __________ times. Complete this exercise __________ times per day. STRETCH - Internal Rotation  Place your right / left hand behind your back, palm up.  Throw a towel or belt over your opposite shoulder. Grasp the towel with your right / left hand.  While keeping an upright posture, gently pull up on the towel, until you feel a stretch in the front of your right / left shoulder.  Avoid shrugging your right / left shoulder as your arm rises, by keeping your shoulder blade tucked down and toward your mid-back spine.  Hold for __________ seconds. Release the stretch, by lowering your healthy hand. Repeat __________ times. Complete this exercise __________ times per day. ROM - Internal Rotation   Using an underhand grip, grasp a stick behind your back with both hands.  While standing upright with good posture, slide the stick up your back until you feel a mild stretch in the front of your shoulder.  Hold for __________ seconds. Slowly return to your starting position. Repeat __________ times. Complete this exercise __________ times per day.  STRETCH - Posterior Shoulder  Capsule   Stand or sit with good posture. Grasp your right / left elbow and draw it across your chest, keeping it at the same height as your shoulder.  Pull your elbow, so your upper arm comes in closer to your chest. Pull until you feel a gentle stretch in the back of your shoulder.  Hold for __________ seconds. Repeat __________ times. Complete this exercise __________ times per day. STRENGTHENING EXERCISES - Impingement Syndrome (Rotator Cuff Tendinitis, Bursitis) These exercises may help you when beginning to rehabilitate your injury. They may resolve your symptoms with or without further involvement from your physician, physical therapist or athletic trainer. While completing these exercises, remember:  Muscles can gain both the endurance and the strength needed for everyday activities through controlled exercises.  Complete these exercises as instructed by your physician, physical therapist or athletic trainer. Increase the resistance and repetitions only as guided.  You may experience muscle soreness or fatigue, but the pain or discomfort you are trying to  eliminate should never worsen during these exercises. If this pain does get worse, stop and make sure you are following the directions exactly. If the pain is still present after adjustments, discontinue the exercise until you can discuss the trouble with your clinician.  During your recovery, avoid activity or exercises which involve actions that place your injured hand or elbow above your head or behind your back or head. These positions stress the tissues which you are trying to heal. STRENGTH - Scapular Depression and Adduction   With good posture, sit on a firm chair. Support your arms in front of you, with pillows, arm rests, or on a table top. Have your elbows in line with the sides of your body.  Gently draw your shoulder blades down and toward your mid-back spine. Gradually increase the tension, without tensing the muscles  along the top of your shoulders and the back of your neck.  Hold for __________ seconds. Slowly release the tension and relax your muscles completely before starting the next repetition.  After you have practiced this exercise, remove the arm support and complete the exercise in standing as well as sitting position. Repeat __________ times. Complete this exercise __________ times per day.  STRENGTH - Shoulder Abductors, Isometric  With good posture, stand or sit about 4-6 inches from a wall, with your right / left side facing the wall.  Bend your right / left elbow. Gently press your right / left elbow into the wall. Increase the pressure gradually, until you are pressing as hard as you can, without shrugging your shoulder or increasing any shoulder discomfort.  Hold for __________ seconds.  Release the tension slowly. Relax your shoulder muscles completely before you begin the next repetition. Repeat __________ times. Complete this exercise __________ times per day.  STRENGTH - External Rotators, Isometric  Keep your right / left elbow at your side and bend it 90 degrees.  Step into a door frame so that the outside of your right / left wrist can press against the door frame without your upper arm leaving your side.  Gently press your right / left wrist into the door frame, as if you were trying to swing the back of your hand away from your stomach. Gradually increase the tension, until you are pressing as hard as you can, without shrugging your shoulder or increasing any shoulder discomfort.  Hold for __________ seconds.  Release the tension slowly. Relax your shoulder muscles completely before you begin the next repetition. Repeat __________ times. Complete this exercise __________ times per day.  STRENGTH - Supraspinatus   Stand or sit with good posture. Grasp a __________ weight, or an exercise band or tubing, so that your hand is "thumbs-up," like you are shaking hands.  Slowly  lift your right / left arm in a "V" away from your thigh, diagonally into the space between your side and straight ahead. Lift your hand to shoulder height or as far as you can, without increasing any shoulder pain. At first, many people do not lift their hands above shoulder height.  Avoid shrugging your right / left shoulder as your arm rises, by keeping your shoulder blade tucked down and toward your mid-back spine.  Hold for __________ seconds. Control the descent of your hand, as you slowly return to your starting position. Repeat __________ times. Complete this exercise __________ times per day.  STRENGTH - External Rotators  Secure a rubber exercise band or tubing to a fixed object (table, pole) so that it  is at the same height as your right / left elbow when you are standing or sitting on a firm surface.  Stand or sit so that the secured exercise band is at your uninjured side.  Bend your right / left elbow 90 degrees. Place a folded towel or small pillow under your right / left arm, so that your elbow is a few inches away from your side.  Keeping the tension on the exercise band, pull it away from your body, as if pivoting on your elbow. Be sure to keep your body steady, so that the movement is coming only from your rotating shoulder.  Hold for __________ seconds. Release the tension in a controlled manner, as you return to the starting position. Repeat __________ times. Complete this exercise __________ times per day.  STRENGTH - Internal Rotators   Secure a rubber exercise band or tubing to a fixed object (table, pole) so that it is at the same height as your right / left elbow when you are standing or sitting on a firm surface.  Stand or sit so that the secured exercise band is at your right / left side.  Bend your elbow 90 degrees. Place a folded towel or small pillow under your right / left arm so that your elbow is a few inches away from your side.  Keeping the tension on the  exercise band, pull it across your body, toward your stomach. Be sure to keep your body steady, so that the movement is coming only from your rotating shoulder.  Hold for __________ seconds. Release the tension in a controlled manner, as you return to the starting position. Repeat __________ times. Complete this exercise __________ times per day.  STRENGTH - Scapular Protractors, Standing   Stand arms length away from a wall. Place your hands on the wall, keeping your elbows straight.  Begin by dropping your shoulder blades down and toward your mid-back spine.  To strengthen your protractors, keep your shoulder blades down, but slide them forward on your rib cage. It will feel as if you are lifting the back of your rib cage away from the wall. This is a subtle motion and can be challenging to complete. Ask your caregiver for further instruction, if you are not sure you are doing the exercise correctly.  Hold for __________ seconds. Slowly return to the starting position, resting the muscles completely before starting the next repetition. Repeat __________ times. Complete this exercise __________ times per day. STRENGTH - Scapular Protractors, Supine  Lie on your back on a firm surface. Extend your right / left arm straight into the air while holding a __________ weight in your hand.  Keeping your head and back in place, lift your shoulder off the floor.  Hold for __________ seconds. Slowly return to the starting position, and allow your muscles to relax completely before starting the next repetition. Repeat __________ times. Complete this exercise __________ times per day. STRENGTH - Scapular Protractors, Quadruped  Get onto your hands and knees, with your shoulders directly over your hands (or as close as you can be, comfortably).  Keeping your elbows locked, lift the back of your rib cage up into your shoulder blades, so your mid-back rounds out. Keep your neck muscles relaxed.  Hold  this position for __________ seconds. Slowly return to the starting position and allow your muscles to relax completely before starting the next repetition. Repeat __________ times. Complete this exercise __________ times per day.  STRENGTH - Scapular Retractors  Secure a rubber exercise band or tubing to a fixed object (table, pole), so that it is at the height of your shoulders when you are either standing, or sitting on a firm armless chair.  With a palm down grip, grasp an end of the band in each hand. Straighten your elbows and lift your hands straight in front of you, at shoulder height. Step back, away from the secured end of the band, until it becomes tense.  Squeezing your shoulder blades together, draw your elbows back toward your sides, as you bend them. Keep your upper arms lifted away from your body throughout the exercise.  Hold for __________ seconds. Slowly ease the tension on the band, as you reverse the directions and return to the starting position. Repeat __________ times. Complete this exercise __________ times per day. STRENGTH - Shoulder Extensors   Secure a rubber exercise band or tubing to a fixed object (table, pole) so that it is at the height of your shoulders when you are either standing, or sitting on a firm armless chair.  With a thumbs-up grip, grasp an end of the band in each hand. Straighten your elbows and lift your hands straight in front of you, at shoulder height. Step back, away from the secured end of the band, until it becomes tense.  Squeezing your shoulder blades together, pull your hands down to the sides of your thighs. Do not allow your hands to go behind you.  Hold for __________ seconds. Slowly ease the tension on the band, as you reverse the directions and return to the starting position. Repeat __________ times. Complete this exercise __________ times per day.  STRENGTH - Scapular Retractors and External Rotators   Secure a rubber exercise  band or tubing to a fixed object (table, pole) so that it is at the height as your shoulders, when you are either standing, or sitting on a firm armless chair.  With a palm down grip, grasp an end of the band in each hand. Bend your elbows 90 degrees and lift your elbows to shoulder height, at your sides. Step back, away from the secured end of the band, until it becomes tense.  Squeezing your shoulder blades together, rotate your shoulders so that your upper arms and elbows remain stationary, but your fists travel upward to head height.  Hold for __________ seconds. Slowly ease the tension on the band, as you reverse the directions and return to the starting position. Repeat __________ times. Complete this exercise __________ times per day.  STRENGTH - Scapular Retractors and External Rotators, Rowing   Secure a rubber exercise band or tubing to a fixed object (table, pole) so that it is at the height of your shoulders, when you are either standing, or sitting on a firm armless chair.  With a palm down grip, grasp an end of the band in each hand. Straighten your elbows and lift your hands straight in front of you, at shoulder height. Step back, away from the secured end of the band, until it becomes tense.  Step 1: Squeeze your shoulder blades together. Bending your elbows, draw your hands to your chest, as if you are rowing a boat. At the end of this motion, your hands and elbow should be at shoulder height and your elbows should be out to your sides.  Step 2: Rotate your shoulders, to raise your hands above your head. Your forearms should be vertical and your upper arms should be horizontal.  Hold for __________ seconds.  Slowly ease the tension on the band, as you reverse the directions and return to the starting position. Repeat __________ times. Complete this exercise __________ times per day.  STRENGTH - Scapular Depressors  Find a sturdy chair without wheels, such as a dining room  chair.  Keeping your feet on the floor, and your hands on the chair arms, lift your bottom up from the seat, and lock your elbows.  Keeping your elbows straight, allow gravity to pull your body weight down. Your shoulders will rise toward your ears.  Raise your body against gravity by drawing your shoulder blades down your back, shortening the distance between your shoulders and ears. Although your feet should always maintain contact with the floor, your feet should progressively support less body weight, as you get stronger.  Hold for __________ seconds. In a controlled and slow manner, lower your body weight to begin the next repetition. Repeat __________ times. Complete this exercise __________ times per day.    This information is not intended to replace advice given to you by your health care provider. Make sure you discuss any questions you have with your health care provider.   Document Released: 12/27/2004 Document Revised: 01/17/2014 Document Reviewed: 04/10/2008 Elsevier Interactive Patient Education [employer].

## 2015-02-25 ENCOUNTER — Ambulatory Visit: Payer: Commercial Managed Care - PPO | Admitting: Family Medicine

## 2015-02-26 ENCOUNTER — Telehealth: Payer: Self-pay

## 2015-02-26 DIAGNOSIS — M79643 Pain in unspecified hand: Secondary | ICD-10-CM

## 2015-02-26 MED ORDER — HYDROCODONE-ACETAMINOPHEN 10-325 MG PO TABS: ORAL_TABLET | ORAL | Status: DC

## 2015-02-26 NOTE — Telephone Encounter (Signed)
Pt.notified

## 2015-02-26 NOTE — Telephone Encounter (Signed)
Pt called wanting a refill of hydrocodone.  This Rx was last filled 02/11/15.  To me seems to be too early for a refill.  Please advise.

## 2015-02-26 NOTE — Telephone Encounter (Signed)
If you check my progress note from that day a Rx was not printed off that day.  I also checked the [facility] and he last filled this medication on 02/06/15, refill is ok today, in your in box.

## 2015-03-27 ENCOUNTER — Telehealth: Payer: Self-pay

## 2015-03-27 ENCOUNTER — Other Ambulatory Visit: Payer: Self-pay

## 2015-03-27 DIAGNOSIS — E785 Hyperlipidemia, unspecified: Secondary | ICD-10-CM

## 2015-03-27 DIAGNOSIS — M79643 Pain in unspecified hand: Secondary | ICD-10-CM

## 2015-03-27 DIAGNOSIS — E039 Hypothyroidism, unspecified: Secondary | ICD-10-CM

## 2015-03-27 MED ORDER — HYDROCODONE-ACETAMINOPHEN 10-325 MG PO TABS: ORAL_TABLET | ORAL | Status: DC

## 2015-03-27 NOTE — Telephone Encounter (Signed)
Pt.notified

## 2015-03-27 NOTE — Telephone Encounter (Signed)
I believe only a TSH is needed, lab in your in box.

## 2015-03-27 NOTE — Telephone Encounter (Signed)
Pt have an upcoming appointment and would like to get labs drawn before appointment.

## 2015-04-09 LAB — TSH: TSH: 0.22 m[IU]/L — AB (ref 0.40–4.50)

## 2015-04-10 ENCOUNTER — Telehealth: Payer: Self-pay | Admitting: Family Medicine

## 2015-04-10 MED ORDER — LEVOTHYROXINE SODIUM 112 MCG PO TABS: 224.0000 ug | ORAL_TABLET | Freq: Every day | ORAL | Status: DC

## 2015-04-10 NOTE — Telephone Encounter (Signed)
Pt.notified

## 2015-04-10 NOTE — Telephone Encounter (Signed)
Will you please let patient know that his thyroid supplement appears to be slightly under dosed so I'd recommend he increase it to 239mg daily, this can be achieved by taking two 1147m tablets daily that [email or web address] recommend rechecking a tsh in 2-3 months.

## 2015-04-13 ENCOUNTER — Other Ambulatory Visit: Payer: Self-pay | Admitting: Family Medicine

## 2015-04-13 ENCOUNTER — Encounter: Payer: Self-pay | Admitting: Family Medicine

## 2015-04-13 ENCOUNTER — Ambulatory Visit (INDEPENDENT_AMBULATORY_CARE_PROVIDER_SITE_OTHER): Payer: Commercial Managed Care - PPO | Admitting: Family Medicine

## 2015-04-13 VITALS — BP 143/80 | HR 82 | Wt 187.0 lb

## 2015-04-13 DIAGNOSIS — Z23 Encounter for immunization: Secondary | ICD-10-CM

## 2015-04-13 DIAGNOSIS — G588 Other specified mononeuropathies: Secondary | ICD-10-CM | POA: Diagnosis not present

## 2015-04-13 DIAGNOSIS — E039 Hypothyroidism, unspecified: Secondary | ICD-10-CM

## 2015-04-13 DIAGNOSIS — Z0000 Encounter for general adult medical examination without abnormal findings: Secondary | ICD-10-CM

## 2015-04-13 DIAGNOSIS — C3491 Malignant neoplasm of unspecified part of right bronchus or lung: Secondary | ICD-10-CM

## 2015-04-13 DIAGNOSIS — I10 Essential (primary) hypertension: Secondary | ICD-10-CM | POA: Diagnosis not present

## 2015-04-13 DIAGNOSIS — E785 Hyperlipidemia, unspecified: Secondary | ICD-10-CM

## 2015-04-13 DIAGNOSIS — Z125 Encounter for screening for malignant neoplasm of prostate: Secondary | ICD-10-CM

## 2015-04-13 LAB — LIPID PANEL
CHOLESTEROL: 185 mg/dL (ref 125–200)
HDL: 89 mg/dL (ref >=40)
LDL CALC: 82 mg/dL (ref <130)
TRIGLYCERIDES: 71 mg/dL (ref <150)
Total CHOL/HDL Ratio: 2.1 Ratio (ref <=5.0)
VLDL: 14 mg/dL (ref <30)

## 2015-04-13 LAB — COMPLETE METABOLIC PANEL WITH GFR
ALBUMIN: 4 g/dL (ref 3.6–5.1)
ALT: 15 U/L (ref 9–46)
AST: 16 U/L (ref 10–35)
Alkaline Phosphatase: 42 U/L (ref 40–115)
BILIRUBIN TOTAL: 0.6 mg/dL (ref 0.2–1.2)
BUN: 21 mg/dL (ref 7–25)
CO2: 26 mmol/L (ref 20–31)
CREATININE: 0.99 mg/dL (ref 0.70–1.25)
Calcium: 9.5 mg/dL (ref 8.6–10.3)
Chloride: 104 mmol/L (ref 98–110)
GFR, Est Non African American: 80 mL/min (ref >=60)
Glucose, Bld: 113 mg/dL — ABNORMAL HIGH (ref 65–99)
Potassium: 4.2 mmol/L (ref 3.5–5.3)
Sodium: 138 mmol/L (ref 135–146)
TOTAL PROTEIN: 6.8 g/dL (ref 6.1–8.1)

## 2015-04-13 LAB — CBC
HCT: 39.8 % (ref 38.5–50.0)
Hemoglobin: 13.2 g/dL (ref 13.2–17.1)
MCH: 30.8 pg (ref 27.0–33.0)
MCHC: 33.2 g/dL (ref 32.0–36.0)
MCV: 92.8 fL (ref 80.0–100.0)
MPV: 11.2 fL (ref 7.5–12.5)
PLATELETS: 238 10*3/uL (ref 140–400)
RBC: 4.29 MIL/uL (ref 4.20–5.80)
RDW: 15 % (ref 11.0–15.0)
WBC: 5.8 10*3/uL (ref 3.8–10.8)

## 2015-04-13 MED ORDER — VALSARTAN-HYDROCHLOROTHIAZIDE 160-25 MG PO TABS: 1.0000 | ORAL_TABLET | Freq: Every day | ORAL | Status: DC

## 2015-04-13 MED ORDER — ZOSTER VACCINE LIVE 19400 UNT/0.65ML ~~LOC~~ SOLR: 0.6500 mL | Freq: Once | SUBCUTANEOUS | Status: DC

## 2015-04-13 NOTE — Progress Notes (Signed)
Subjective:    Walter Wilkerson is a 65 y.o. male who presents for Medicare Initial preventive examination.   Preventive Screening-Counseling & Management  Tobacco History  Smoking status  . Former Smoker  . Quit date: 08/10/2004  Smokeless tobacco  . Not on file    Colonoscopy: UTD from 2012 Prostate: Discussed screening risks/beneifts with patient today, PSA today  Influenza Vaccine: UTD Pneumovax: Will receive today Td/Tdap: UTD Zoster: Rx given today  Has lost 25 pounds with diet and exercise  Problems Prior to Visit 1. HTN  Current Problems (verified) Patient Active Problem List   Diagnosis Date Noted  . Shoulder impingement 02/24/2015  . Acute bronchitis 01/08/2015  . Thyroid activity decreased 08/11/2014  . Essential hypertension 08/11/2014  . Hyperlipidemia 08/11/2014  . 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 Non-small cell lung cancer ([place]) 08/11/2014    Medications Prior to Visit Current Outpatient Prescriptions on File Prior to Visit  Medication Sig Dispense Refill  . diclofenac (VOLTAREN) 75 MG EC tablet TAKE ONE TABLET BY MOUTH 2 TIMES A DAY 180 tablet 1  . HYDROcodone-acetaminophen (NORCO) 10-325 MG tablet One by mouth every 6-8 hours as needed for pain. 120 tablet 0  . levothyroxine (SYNTHROID, LEVOTHROID) 112 MCG tablet Take 2 tablets (224 mcg total) by mouth daily. 180 tablet 1   No current facility-administered medications on file prior to visit.    Current Medications (verified) Current Outpatient Prescriptions  Medication Sig Dispense Refill  . diclofenac (VOLTAREN) 75 MG EC tablet TAKE ONE TABLET BY MOUTH 2 TIMES A DAY 180 tablet 1  . HYDROcodone-acetaminophen (NORCO) 10-325 MG tablet One by mouth every 6-8 hours as needed for pain. 120 tablet 0  . levothyroxine (SYNTHROID, LEVOTHROID) 112 MCG tablet Take 2 tablets (224 mcg total) by mouth daily. 180 tablet 1  . valsartan-hydrochlorothiazide (DIOVAN-HCT) 160-25 MG tablet Take 1 tablet by  mouth daily. 90 tablet 1   No current facility-administered medications for this visit.     Allergies (verified) Review of patient's allergies indicates no known allergies.   PAST HISTORY  Family History Family History  Problem Relation Age of Onset  . Heart attack Father   . Diabetes      grandmother   . Hyperlipidemia Father   . Hypertension Father     Social History Social History  Substance Use Topics  . Smoking status: Former Smoker    Quit date: 08/10/2004  . Smokeless tobacco: Not on file  . Alcohol Use: 3.0 oz/week    5 Standard drinks or equivalent per week    Are there smokers in your home (other than you)?  No  Risk Factors Current exercise habits: Gym/ health club routine includes cardio.  Dietary issues discussed: DASH   Cardiac risk factors: advanced age (older than 69 for men, 71 for women), dyslipidemia and hypertension.  Depression Screen (Note: if answer to either of the following is "Yes", a more complete depression screening is indicated)   Q1: Over the past two weeks, have you felt down, depressed or hopeless? No  Q2: Over the past two weeks, have you felt little interest or pleasure in doing things? No  Have you lost interest or pleasure in daily life? No  Do you often feel hopeless? No  Do you cry easily over simple problems? No  Activities of Daily Living In your present state of health, do you have any difficulty performing the following activities?:  Driving? No Managing money?  No Feeding yourself? No Getting from bed  to chair? No Climbing a flight of stairs? No Preparing food and eating?: No Bathing or showering? No Getting dressed: No Getting to the toilet? No Using the toilet:No Moving around from place to place: No In the past year have you fallen or had a near fall?:No   Are you sexually active?  No  Do you have more than one partner?  No  Hearing Difficulties: No Do you often ask people to speak up or repeat themselves?  No Do you experience ringing or noises in your ears? No Do you have difficulty understanding soft or whispered voices? No   Do you feel that you have a problem with memory? No  Do you often misplace items? No  Do you feel safe at home?  Yes  Cognitive Testing  Alert? Yes  Normal Appearance?Yes  Oriented to person? Yes  Place? Yes   Time? Yes  Recall of three objects?  Yes  Can perform simple calculations? Yes  Displays appropriate judgment?Yes  Can read the correct time from a watch face?Yes   Advanced Directives have been discussed with the patient? Yes   List the Names of Other Physician/Practitioners you currently use: 1.    Indicate any recent Medical Services you may have received from other than [facility] providers in the past year (date may be approximate).  Immunization History  Administered Date(s) Administered  . Influenza,inj,Quad PF,36+ Mos 11/11/2014  . Tdap 09/22/2010    Screening Tests Health Maintenance  Topic Date Due  . Hepatitis C Screening  1950/03/20  . HIV Screening  10/[birth year]/1964  . COLONOSCOPY  08/10/2004  . ZOSTAVAX  10/16/2009  . PNA vac Low Risk Adult (1 of 2 - PCV13) 10/17/2014  . INFLUENZA VACCINE  08/11/2015  . TETANUS/TDAP  09/21/2020    All answers were reviewed with the patient and necessary referrals were made:  Marcial Pacas, DO   04/13/2015   History reviewed: allergies, current medications, past family history, past medical history, past social history, past surgical history and problem list  Review of Systems Review of Systems - General ROS: negative for - chills, fever, night sweats, weight gain or weight loss Ophthalmic ROS: negative for - decreased vision Psychological ROS: negative for - anxiety or depression ENT ROS: negative for - hearing change, nasal congestion, tinnitus or allergies Hematological and Lymphatic ROS: negative for - bleeding problems, bruising or swollen lymph nodes Breast ROS: negative Respiratory ROS: no  cough, shortness of breath, or wheezing Cardiovascular ROS: no chest pain or dyspnea on exertion Gastrointestinal ROS: no abdominal pain, change in bowel habits, or black or bloody stools Genito-Urinary ROS: negative for - genital discharge, genital ulcers, incontinence or abnormal bleeding from genitals Musculoskeletal ROS: negative for - joint pain or muscle pain Neurological ROS: negative for - headaches or memory loss Dermatological ROS: negative for lumps, mole changes, rash and skin lesion changes   Objective:     Vision by Snellen chart: right eye:20/20, left eye:20/15 Blood pressure 143/80, pulse 82, weight 187 lb (84.823 kg). Body mass index is 27.23 kg/(m^2).  General: No Acute Distress HEENT: Atraumatic, normocephalic, conjunctivae normal without scleral icterus.  No nasal discharge, hearing grossly intact, TMs with good landmarks bilaterally with no middle ear abnormalities, posterior pharynx clear without oral lesions. Neck: Supple, trachea midline, no cervical nor supraclavicular adenopathy. Pulmonary: Clear to auscultation bilaterally without wheezing, rhonchi, nor rales. Cardiac: Regular rate and rhythm.  No murmurs, rubs, nor gallops. No peripheral edema.  2+ peripheral pulses bilaterally. Abdomen: Bowel sounds  normal.  No masses.  Non-tender without rebound.  Negative Murphy's sign. MSK: Grossly intact, no signs of weakness.  Full strength throughout upper and lower extremities.  Full ROM in upper and lower extremities.  No midline spinal tenderness. Neuro: Gait unremarkable, CN II-XII grossly intact.  C5-C6 Reflex 2/4 Bilaterally, L4 Reflex 2/4 Bilaterally.  Cerebellar function intact. Skin: No rashes. Psych: Alert and oriented to person/place/time.  Thought process normal. No anxiety/depression.     Assessment:     No abnormalities      Plan:     During the course of the visit the patient was educated and counseled about appropriate screening and preventive  services including:    Pneumococcal vaccine   Diet review for nutrition referral?  Not Indicated ____   Patient Instructions (the written plan) was given to the patient.  Medicare Attestation I have personally reviewed: The patient's medical and social history Their use of alcohol, tobacco or illicit drugs Their current medications and supplements The patient's functional ability including ADLs,fall risks, home safety risks, cognitive, and hearing and visual impairment Diet and physical activities Evidence for depression or mood disorders  The patient's weight, height, BMI, and visual acuity have been recorded in the chart.  I have made referrals, counseling, and provided education to the patient based on review of the above and I have provided the patient with a written personalized care plan for preventive services.     Marcial Pacas, DO   04/13/2015

## 2015-04-13 NOTE — Progress Notes (Deleted)
  Subjective:    Walter Wilkerson is a 65 y.o. male who presents for a welcome to Medicare exam.   Cardiac risk factors: {risk factors:510}.  Depression Screen (Note: if answer to either of the following is "Yes", a more complete depression screening is indicated)  Q1: Over the past two weeks, have you felt down, depressed or hopeless? {yes/no:311178} Q2: Over the past two weeks, have you felt little interest or pleasure in doing things? {yes/no:311178}  Activities of Daily Living In your present state of health, do you have any difficulty performing the following activities?:  Preparing food and eating?: {yes/no (default no):140031::"No"} Bathing yourself: {yes/no (default no):140031::"No"} Getting dressed: {yes/no (default no):140031::"No"} Using the toilet:{yes/no (default no):140031::"No"} Moving around from place to place: {yes/no (default no):140031::"No"} In the past year have you fallen or had a near fall?:{yes/no (default no):140031::"No"}  Current exercise habits: {exercise:19826}  Dietary issues discussed: ***  Hearing difficulties: {yes/no (default no):140031::"No"} Safe in current home environment: {yes no free text:314490}  {Common ambulatory SmartLinks:19316} Review of Systems {ros; complete:30496}    Objective:     Vision by Snellen chart: right eye:{vision:19455::"20/20"}, left eye:{vision:19455::"20/20"} Blood pressure 143/80, pulse 82, weight 187 lb (84.823 kg). Body mass index is 27.23 kg/(m^2). {Exam, CYOY:24175}    Assessment:    ***     Plan:     During the course of the visit the patient was educated and counseled about appropriate screening and preventive services including:   {plan:19837}  Patient Instructions (the written plan) was given to the patient.

## 2015-04-14 ENCOUNTER — Telehealth: Payer: Self-pay | Admitting: Family Medicine

## 2015-04-14 DIAGNOSIS — R7303 Prediabetes: Secondary | ICD-10-CM

## 2015-04-14 DIAGNOSIS — R739 Hyperglycemia, unspecified: Secondary | ICD-10-CM

## 2015-04-14 LAB — HEMOGLOBIN A1C
HEMOGLOBIN A1C: 5.8 % — AB (ref <5.7)
Mean Plasma Glucose: 120 mg/dL

## 2015-04-14 LAB — PSA: PSA: 0.74 ng/mL (ref <=4.00)

## 2015-04-14 NOTE — Telephone Encounter (Signed)
Will you please let patient know that his cholesterol, kidney function, liver function, blood cell count, and PSA prostate test were all normal.  His blood sugar was mildly elevated and I'd recommend he have an A1c checked, I'll print off a lab slip just in case the lab isn't able to add on this test from yesterday's blood.

## 2015-04-14 NOTE — Telephone Encounter (Signed)
A1c added ?

## 2015-04-15 DIAGNOSIS — R7303 Prediabetes: Secondary | ICD-10-CM | POA: Insufficient documentation

## 2015-04-15 NOTE — Telephone Encounter (Signed)
Will you please let patient know that his a1c was just barely in the prediabetic range and no new medication is needed to address this.  Keeping his weight down will help address this.

## 2015-04-15 NOTE — Telephone Encounter (Signed)
Pt.notified

## 2015-04-22 ENCOUNTER — Telehealth: Payer: Self-pay

## 2015-04-22 DIAGNOSIS — M79643 Pain in unspecified hand: Secondary | ICD-10-CM

## 2015-04-22 MED ORDER — HYDROCODONE-ACETAMINOPHEN 10-325 MG PO TABS: ORAL_TABLET | ORAL | Status: DC

## 2015-04-22 NOTE — Telephone Encounter (Signed)
I'm ok with this Walter Wilkerson, Rx placed in in-box ready for pickup/faxing.

## 2015-04-22 NOTE — Telephone Encounter (Signed)
Pt.notified

## 2015-04-22 NOTE — Telephone Encounter (Signed)
Pt is requesting that his hydrocodone be filled early.  It is due on Monday.  Is this request appropriate?

## 2015-05-22 ENCOUNTER — Ambulatory Visit (INDEPENDENT_AMBULATORY_CARE_PROVIDER_SITE_OTHER): Payer: Commercial Managed Care - PPO | Admitting: Family Medicine

## 2015-05-22 ENCOUNTER — Encounter: Payer: Self-pay | Admitting: Family Medicine

## 2015-05-22 VITALS — BP 158/77 | HR 94 | Wt 180.0 lb

## 2015-05-22 DIAGNOSIS — M79643 Pain in unspecified hand: Secondary | ICD-10-CM

## 2015-05-22 DIAGNOSIS — C3491 Malignant neoplasm of unspecified part of right bronchus or lung: Secondary | ICD-10-CM | POA: Diagnosis not present

## 2015-05-22 DIAGNOSIS — L600 Ingrowing nail: Secondary | ICD-10-CM

## 2015-05-22 MED ORDER — HYDROCODONE-ACETAMINOPHEN 10-325 MG PO TABS: ORAL_TABLET | ORAL | Status: DC

## 2015-05-22 MED ORDER — AMOXICILLIN-POT CLAVULANATE 500-125 MG PO TABS: ORAL_TABLET | ORAL | Status: AC

## 2015-05-22 NOTE — Progress Notes (Signed)
CC: Walter Wilkerson is a 65 y.o. male is here for Ingrown Toenail   Subjective: HPI:  About one week ago he noticed swelling of the right great toe at the medial nail bed. It's been getting worse on a daily basis. Slightly tender to the touch. Interventions have included hydrogen peroxide and Epsom salt soaks. He's had this once before but he was much much younger. He denies any fevers, chills or joint pain elsewhere. He denies any pain with light touch. Symptoms are mild in severity  Request refill on pain medication for his bilateral hand pain that's been present ever since he had chemotherapy for lung cancer.   Review Of Systems Outlined In HPI  Past Medical History  Diagnosis Date  . Hypertension   . Thyroid disease   . Lung cancer [facility])     Past Surgical History  Procedure Laterality Date  . Vasectomy     Family History  Problem Relation Age of Onset  . Heart attack Father   . Diabetes      grandmother   . Hyperlipidemia Father   . Hypertension Father     Social History   Social History  . Marital Status: Married    Spouse Name: N/A  . Number of Children: N/A  . Years of Education: N/A   Occupational History  . Not on file.   Social History Main Topics  . Smoking status: Former Smoker    Quit date: 08/10/2004  . Smokeless tobacco: Not on file  . Alcohol Use: 3.0 oz/week    5 Standard drinks or equivalent per week  . Drug Use: No  . Sexual Activity:    Partners: Female   Other Topics Concern  . Not on file   Social History Narrative     Objective: BP 158/77 mmHg  Pulse 94  Wt 180 lb (81.647 kg)  Vital signs reviewed. General: Alert and Oriented, No Acute Distress HEENT: Pupils equal, round, reactive to light. Conjunctivae clear.  External ears unremarkable.  Moist mucous membranes. Lungs: Clear and comfortable work of breathing, speaking in full sentences without accessory muscle use. Cardiac: Regular rate and rhythm.  Neuro: CN II-XII grossly  intact, gait normal. Extremities: No peripheral edema.  Strong peripheral pulses.  Mental Status: No depression, anxiety, nor agitation. Logical though process. Skin: Warm and dry. The medial surface of the right great toenail that is moderately erythematous and edematous, tender to the touch. Assessment & Plan: [name] was seen today for ingrown toenail.  Diagnoses and all orders for this visit:  Pain of hand, unspecified laterality -     HYDROcodone-acetaminophen (NORCO) 10-325 MG tablet; One by mouth every 6-8 hours as needed for pain.  Ingrown right big toenail -     amoxicillin-clavulanate (AUGMENTIN) 500-125 MG tablet; Take one by mouth every 8 hours for ten total days.  Non-small cell lung cancer, right (HCC)   Ingrown toenail: Start Augmentin and continue to use Epsom salt soaks for at least 15 minutes 3 times a day. Call if no better by Monday/Tuesday.  Return if symptoms worsen or fail to improve.

## 2015-06-23 ENCOUNTER — Other Ambulatory Visit: Payer: Self-pay

## 2015-06-23 DIAGNOSIS — M79643 Pain in unspecified hand: Secondary | ICD-10-CM

## 2015-06-23 MED ORDER — HYDROCODONE-ACETAMINOPHEN 10-325 MG PO TABS: ORAL_TABLET | ORAL | Status: DC

## 2015-07-03 ENCOUNTER — Encounter: Payer: Self-pay | Admitting: Family Medicine

## 2015-07-03 ENCOUNTER — Ambulatory Visit (INDEPENDENT_AMBULATORY_CARE_PROVIDER_SITE_OTHER): Payer: Commercial Managed Care - PPO | Admitting: Family Medicine

## 2015-07-03 VITALS — BP 164/91 | HR 67 | Wt 179.0 lb

## 2015-07-03 DIAGNOSIS — L600 Ingrowing nail: Secondary | ICD-10-CM

## 2015-07-03 MED ORDER — CEFDINIR 300 MG PO CAPS: 300.0000 mg | ORAL_CAPSULE | Freq: Two times a day (BID) | ORAL | Status: AC

## 2015-07-03 NOTE — Progress Notes (Signed)
CC: Walter Wilkerson is a 65 y.o. male is here for Ingrown Toenail   Subjective: HPI:  Right great toe pain localized to the medial nail fold. Present for the past month. Slowly improved with using Augmentin and Epsom salts soaking. 3 days after the regimen was over though it started to get worse. He's been soaking it on a daily basis. He gets bloody discharge whenever he wears socks. No other interventions as of yet. The pain is getting worse and worse with walking. He denies any skin changes toenail issues elsewhere. Denies fevers, chills or unintentional weight loss   Review Of Systems Outlined In HPI  Past Medical History  Diagnosis Date  . Hypertension   . Thyroid disease   . Lung cancer [facility])     Past Surgical History  Procedure Laterality Date  . Vasectomy     Family History  Problem Relation Age of Onset  . Heart attack Father   . Diabetes      grandmother   . Hyperlipidemia Father   . Hypertension Father     Social History   Social History  . Marital Status: Married    Spouse Name: N/A  . Number of Children: N/A  . Years of Education: N/A   Occupational History  . Not on file.   Social History Main Topics  . Smoking status: Former Smoker    Quit date: 08/10/2004  . Smokeless tobacco: Not on file  . Alcohol Use: 3.0 oz/week    5 Standard drinks or equivalent per week  . Drug Use: No  . Sexual Activity:    Partners: Female   Other Topics Concern  . Not on file   Social History Narrative     Objective: BP 164/91 mmHg  Pulse 67  Wt 179 lb (81.194 kg)  Vital signs reviewed. General: Alert and Oriented, No Acute Distress HEENT: Pupils equal, round, reactive to light. Conjunctivae clear.  External ears unremarkable.  Moist mucous membranes. Lungs: Clear and comfortable work of breathing, speaking in full sentences without accessory muscle use. Cardiac: Regular rate and rhythm.  Neuro: CN II-XII grossly intact, gait normal. Extremities: No peripheral  edema.  Strong peripheral pulses.  Mental Status: No depression, anxiety, nor agitation. Logical though process. Skin: Warm and dry. Ingrown toenail on the right great toe involving the medial aspect with purulent discharge  Assessment & Plan: Dorrien was seen today for ingrown toenail.  Diagnoses and all orders for this visit:  Ingrown toenail  Other orders -     cefdinir (OMNICEF) 300 MG capsule; Take 1 capsule (300 mg total) by mouth 2 (two) times daily.   I like him to continue soaking his foot 2 times a day for 15 minutes with Epsom salts soaks and to begin taking Omnicef. I offered complete toenail removal however he would prefer only the aspect that is involving the medial aspect of the nail.Signs and symptoms requring emergent/urgent reevaluation were discussed with the patient.  Toenail Avulsion Procedure Note  Pre-operative Diagnosis: Right Ingrown Great toenail   Post-operative Diagnosis: Right Ingrown Great toenail  Indications: infection, pain  Anesthesia: Lidocaine 1% without epinephrine without added sodium bicarbonate  Procedure Details  History of allergy to iodine: no  The risks (including bleeding and infection) and benefits of the  procedure and Verbal informed consent obtained.  After digital block anesthesia was obtained, a tourniquet was applied for hemostasis during the procedure.  After prepping with chlorhexadine, the offending edge of the nail was freed from  the nailbed and perionychium, and then split with scissors and removed with  forceps.  All visible granulation tissue is debrided. Antibiotic and bulky dressing was applied.   Findings: success  Complications: none.  Plan: 1. Soak the foot twice daily. Change dressing twice daily until healed over. 2. Warning signs of infection were reviewed.   3. Recommended that the patient use OTC analgesics as needed for pain.  4. Return PRN  Return if symptoms worsen or fail to improve.  25 minutes  spnt face-to-face during visit today of which at least 50% was counseling or coordinating care regarding: 1. Ingrown toenail

## 2015-07-22 ENCOUNTER — Other Ambulatory Visit: Payer: Self-pay

## 2015-07-22 DIAGNOSIS — M79643 Pain in unspecified hand: Secondary | ICD-10-CM

## 2015-07-22 MED ORDER — HYDROCODONE-ACETAMINOPHEN 10-325 MG PO TABS: ORAL_TABLET | ORAL | Status: DC

## 2015-08-10 ENCOUNTER — Telehealth: Payer: Self-pay | Admitting: Family Medicine

## 2015-08-10 DIAGNOSIS — E039 Hypothyroidism, unspecified: Secondary | ICD-10-CM

## 2015-08-10 DIAGNOSIS — R7303 Prediabetes: Secondary | ICD-10-CM

## 2015-08-10 NOTE — Telephone Encounter (Signed)
Patient would like for you to print off his lab orders and leave at front desk for him to pick up... Thanks

## 2015-08-10 NOTE — Telephone Encounter (Signed)
Labs in your in box

## 2015-08-11 NOTE — Telephone Encounter (Signed)
Pt.notified

## 2015-08-20 ENCOUNTER — Other Ambulatory Visit: Payer: Self-pay

## 2015-08-20 DIAGNOSIS — M79643 Pain in unspecified hand: Secondary | ICD-10-CM

## 2015-08-20 MED ORDER — HYDROCODONE-ACETAMINOPHEN 10-325 MG PO TABS: ORAL_TABLET | ORAL | 0 refills | Status: DC

## 2015-08-27 LAB — HEMOGLOBIN A1C
HEMOGLOBIN A1C: 5.3 % (ref <5.7)
MEAN PLASMA GLUCOSE: 105 mg/dL

## 2015-08-27 LAB — TSH: TSH: 0.74 m[IU]/L (ref 0.40–4.50)

## 2015-08-28 ENCOUNTER — Encounter: Payer: Self-pay | Admitting: Family Medicine

## 2015-08-28 ENCOUNTER — Ambulatory Visit (INDEPENDENT_AMBULATORY_CARE_PROVIDER_SITE_OTHER): Payer: Commercial Managed Care - PPO | Admitting: Family Medicine

## 2015-08-28 VITALS — BP 178/92 | HR 66 | Wt 178.0 lb

## 2015-08-28 DIAGNOSIS — I10 Essential (primary) hypertension: Secondary | ICD-10-CM | POA: Diagnosis not present

## 2015-08-28 DIAGNOSIS — G588 Other specified mononeuropathies: Secondary | ICD-10-CM

## 2015-08-28 DIAGNOSIS — E039 Hypothyroidism, unspecified: Secondary | ICD-10-CM | POA: Diagnosis not present

## 2015-08-28 DIAGNOSIS — R7303 Prediabetes: Secondary | ICD-10-CM | POA: Diagnosis not present

## 2015-08-28 DIAGNOSIS — M79643 Pain in unspecified hand: Secondary | ICD-10-CM

## 2015-08-28 MED ORDER — VALSARTAN-HYDROCHLOROTHIAZIDE 160-25 MG PO TABS: 1.0000 | ORAL_TABLET | Freq: Every day | ORAL | 1 refills | Status: DC

## 2015-08-28 MED ORDER — LEVOTHYROXINE SODIUM 112 MCG PO TABS: 224.0000 ug | ORAL_TABLET | Freq: Every day | ORAL | 1 refills | Status: DC

## 2015-08-28 MED ORDER — HYDROCODONE-ACETAMINOPHEN 10-325 MG PO TABS: ORAL_TABLET | ORAL | 0 refills | Status: DC

## 2015-08-28 NOTE — Progress Notes (Signed)
CC: Walter Wilkerson is a 65 y.o. male is here for Hypertension   Subjective: HPI:  Follow-up hypothyroidism: He's been taking levothyroxine on a daily basis with 100% compliance. A TSH was checked earlier this week and was in the normal range. He denies any unintentional weight loss or gain. He's been able to lose almost 20 pounds since the beginning of this year totally intentionally cutting back on calories.  Essential hypertension: Taking Diovan HCT on a daily basis. He denies any chest pain shortness of breath orthopnea nor peripheral edema.  He continues to have moderate to severe pain in his hands that has no particular pattern with respect to time of day or activity. If he takes hydrocodone during a flare up he tells me that he is able to ignore the pain 100%. He denies any known side effects from the medication. He denies any new swelling redness or warmth.  Follow diabetes: He had an A1c checked earlier this week and it was normal. As described above he's been successful with losing weight   Review Of Systems Outlined In HPI  Past Medical History:  Diagnosis Date  . Hypertension   . Lung cancer ([place])   . Thyroid disease     Past Surgical History:  Procedure Laterality Date  . VASECTOMY     Family History  Problem Relation Age of Onset  . Heart attack Father   . Diabetes      grandmother   . Hyperlipidemia Father   . Hypertension Father     Social History   Social History  . Marital status: Married    Spouse name: N/A  . Number of children: N/A  . Years of education: N/A   Occupational History  . Not on file.   Social History Main Topics  . Smoking status: Former Smoker    Quit date: 08/10/2004  . Smokeless tobacco: Not on file  . Alcohol use 3.0 oz/week    5 Standard drinks or equivalent per week  . Drug use: No  . Sexual activity: Yes    Partners: Female   Other Topics Concern  . Not on file   Social History Narrative  . No narrative on file      Objective: BP (!) 178/92   Pulse 66   Wt 178 lb (80.7 kg)   BMI 25.91 kg/m   General: Alert and Oriented, No Acute Distress HEENT: Pupils equal, round, reactive to light. Conjunctivae clear.  Moist mucous membranes Lungs: Clear to auscultation bilaterally, no wheezing/ronchi/rales.  Comfortable work of breathing. Good air movement. Cardiac: Regular rate and rhythm. Normal S1/S2.  No murmurs, rubs, nor gallops.   Extremities: No peripheral edema.  Strong peripheral pulses.  Mental Status: No depression, anxiety, nor agitation. Skin: Warm and dry.  Assessment & Plan: Walter Wilkerson was seen today for hypertension.  Diagnoses and all orders for this visit:  Hypothyroidism, unspecified hypothyroidism type  Essential hypertension -     valsartan-hydrochlorothiazide (DIOVAN-HCT) 160-25 MG tablet; Take 1 tablet by mouth daily.  Other mononeuropathy  Prediabetes  Pain of hand, unspecified laterality -     HYDROcodone-acetaminophen (NORCO) 10-325 MG tablet; One by mouth every 6-8 hours as needed for pain.  Other orders -     levothyroxine (SYNTHROID, LEVOTHROID) 112 MCG tablet; Take 2 tablets (224 mcg total) by mouth daily.   Prediabetes: Controlled with diet and exercise alone repeat A1c in 3-6 months Peripheral neuropathy due to chemotherapy: Continue use of Norco providing adequate pain control. Pain contract  signed today and scanned into the electronic medical record essential hypertension: Uncontrolled chronic condition, he believes that his blood pressure at home is probably normal. He does not have any objective measurements yet to confirm this. He is agreeable to buying a new blood pressure cuff and dropping off daily blood pressures sometime next week. Continue Diovan HCT pending these results  Hypothyroidism: Currently controlled with levothyroxine, refills provided    Discussed with this patient that I will be resigning from my position here with [employer] [facility] in September in  order to stay with my family who will be moving to [facility]. I let him know about the providers that are still accepting patients and I feel that this individual will be under great care if he/she stays here with [facility].  Return in about 3 months (around 11/28/2015) for Refills.

## 2015-08-28 NOTE — Patient Instructions (Signed)
Daily BPs                      .

## 2015-09-04 ENCOUNTER — Ambulatory Visit: Payer: Commercial Managed Care - PPO | Admitting: Family Medicine

## 2015-10-09 ENCOUNTER — Ambulatory Visit (INDEPENDENT_AMBULATORY_CARE_PROVIDER_SITE_OTHER): Payer: Commercial Managed Care - PPO | Admitting: Physician Assistant

## 2015-10-09 ENCOUNTER — Encounter: Payer: Self-pay | Admitting: Physician Assistant

## 2015-10-09 VITALS — BP 147/80 | HR 73 | Wt 178.0 lb

## 2015-10-09 DIAGNOSIS — L729 Follicular cyst of the skin and subcutaneous tissue, unspecified: Secondary | ICD-10-CM

## 2015-10-09 NOTE — Progress Notes (Signed)
Sebaceous Cyst Excision Procedure Note  Pre-operative Diagnosis: Epidermal cyst  Post-operative Diagnosis: same  Locations:lateral thigh  Indications: Epidermal cyst excision   Anesthesia: Lidocaine 2% with epinephrine without added sodium bicarbonate  Procedure Details  History of allergy to iodine: no  Patient informed of the risks (including bleeding and infection) and benefits of the  procedure and Verbal informed consent obtained.  The lesion and surrounding area was given a sterile prep using alcohol and draped in the usual sterile fashion. An incision was made over the cyst, which was dissected free of the surrounding tissue and removed.  The cyst was filled with typical sebaceous material.  The wound was closed with 4-0 Nylon using simple interrupted stitches. Antibiotic ointment and a sterile dressing applied.  The specimen was sent for pathologic examination. The patient tolerated the procedure well.  EBL: 2 ml  Findings: Subnutaneous cyst.  Condition: Stable  Complications: none.  Plan: 1. Instructed to keep the wound dry and covered for 24-48h and clean thereafter. 2. Warning signs of infection were reviewed.   3. Recommended that the patient use NSAID as needed for pain.  4. Return for suture removal in 7 days.     Patient ID: Walter Wilkerson, male   DOB: 1950/05/19, 65 y.o.   MRN: [id number]  HPI Patient is a 65 y.o. Caucasian male presenting today with complaints of a bump on his left leg. The patient notes that about 2 months ago he saw a small bump but that it has continued to grow. In the last two weeks he notes that it has gotten even larger and felt hot, red, and tender. The patient states that a second bump appeared a week ago near the first area.  The patient denies itchiness, discharge or drainage, intense pain, or history of any skin infections. The patient states that he had lung cancer in 2008 with chemo and 2/3 of his right lung removed. The patient  states he still has some shortness of breath when exercising due to this but it is not abnormal for him. The patient denies chest pain, palpitations, activity change, or appetite change.  Review of Systems  Constitutional: Negative for activity change, appetite change, chills, diaphoresis, fatigue, fever and unexpected weight change.  HENT: Negative.   Eyes: Negative.   Respiratory: Positive for chest tightness and shortness of breath. Negative for cough and wheezing.   Cardiovascular: Negative for chest pain, palpitations and leg swelling.  Gastrointestinal: Negative.   Genitourinary: Negative.   Skin: Positive for color change. Negative for pallor, rash and wound.       Patient notes two small bumps on left outer thigh.  Neurological: Negative for dizziness, syncope, weakness, light-headedness, numbness and headaches.      Objective:   Physical Exam  Constitutional: He is oriented to person, place, and time. He appears well-developed and well-nourished. No distress.  HENT:  Head: Normocephalic and atraumatic.  Eyes: Conjunctivae and EOM are normal. Pupils are equal, round, and reactive to light. Right eye exhibits no discharge. Left eye exhibits no discharge. No scleral icterus.  Neck: Normal range of motion. Neck supple.  Cardiovascular: Normal rate, regular rhythm, normal heart sounds and intact distal pulses.  Exam reveals no gallop and no friction rub.   No murmur heard. Pulmonary/Chest: Effort normal and breath sounds normal. No respiratory distress. He has no wheezes. He has no rales. He exhibits no tenderness.  Neurological: He is alert and oriented to person, place, and time. No cranial nerve deficit. Coordination  normal.  Skin: Skin is warm and dry. No rash noted. He is not diaphoretic. There is erythema. No pallor.  Psychiatric: He has a normal mood and affect. His behavior is normal. Judgment and thought content normal.       Assessment:     Leondro was seen today for  recurrent skin infections.  Diagnoses and all orders for this visit:  Subcutaneous cyst      Plan:     1. Subcutaneous cyst - Patient had cyst removal today in-office. Patient tolerated procedure well. Patient instructed to keep area clean and dry. Patient educated to watch for signs of infection including redness, warmth, discharge or drainage, or fever. Patient to follow-up in 1 week for stitch removal.

## 2015-10-09 NOTE — Patient Instructions (Signed)
Epidermal Cyst An epidermal cyst is sometimes called a sebaceous cyst, epidermal inclusion cyst, or infundibular cyst. These cysts usually contain a substance that looks "pasty" or "cheesy" and may have a bad smell. This substance is a protein called keratin. Epidermal cysts are usually found on the face, neck, or trunk. They may also occur in the vaginal area or other parts of the genitalia of both men and women. Epidermal cysts are usually small, painless, slow-growing bumps or lumps that move freely under the skin. It is important not to try to pop them. This may cause an infection and lead to tenderness and swelling. CAUSES  Epidermal cysts may be caused by a deep penetrating injury to the skin or a plugged hair follicle, often associated with acne. SYMPTOMS  Epidermal cysts can become inflamed and cause:  Redness.  Tenderness.  Increased temperature of the skin over the bumps or lumps.  Grayish-white, bad smelling material that drains from the bump or lump. DIAGNOSIS  Epidermal cysts are easily diagnosed by your caregiver during an exam. Rarely, a tissue sample (biopsy) may be taken to rule out other conditions that may resemble epidermal cysts. TREATMENT   Epidermal cysts often get better and disappear on their own. They are rarely ever cancerous.  If a cyst becomes infected, it may become inflamed and tender. This may require opening and draining the cyst. Treatment with antibiotics may be necessary. When the infection is gone, the cyst may be removed with minor surgery.  Small, inflamed cysts can often be treated with antibiotics or by injecting steroid medicines.  Sometimes, epidermal cysts become large and bothersome. If this happens, surgical removal in your caregiver's office may be necessary. HOME CARE INSTRUCTIONS  Only take over-the-counter or prescription medicines as directed by your caregiver.  Take your antibiotics as directed. Finish them even if you start to feel  better. SEEK MEDICAL CARE IF:   Your cyst becomes tender, red, or swollen.  Your condition is not improving or is getting worse.  You have any other questions or concerns. MAKE SURE YOU:  Understand these instructions.  Will watch your condition.  Will get help right away if you are not doing well or get worse.   This information is not intended to replace advice given to you by your health care provider. Make sure you discuss any questions you have with your health care provider.   Document Released: 11/28/2003 Document Revised: 03/21/2011 Document Reviewed: 07/05/2010 Elsevier Interactive Patient Education 2016 [employer] Inc[employer]  

## 2015-10-09 NOTE — Addendum Note (Signed)
Addended by: Delrae Alfred on: 10/09/2015 02:52 PM   Modules accepted: Orders

## 2015-10-14 NOTE — Progress Notes (Signed)
Pt is coming to clinic to discuss on Friday.

## 2015-10-16 ENCOUNTER — Encounter: Payer: Self-pay | Admitting: Physician Assistant

## 2015-10-16 ENCOUNTER — Ambulatory Visit (INDEPENDENT_AMBULATORY_CARE_PROVIDER_SITE_OTHER): Payer: Commercial Managed Care - PPO | Admitting: Physician Assistant

## 2015-10-16 VITALS — BP 147/70 | HR 60 | Ht 69.5 in | Wt 178.0 lb

## 2015-10-16 DIAGNOSIS — Z23 Encounter for immunization: Secondary | ICD-10-CM

## 2015-10-16 DIAGNOSIS — M79643 Pain in unspecified hand: Secondary | ICD-10-CM

## 2015-10-16 DIAGNOSIS — L309 Dermatitis, unspecified: Secondary | ICD-10-CM | POA: Diagnosis not present

## 2015-10-16 MED ORDER — TRIAMCINOLONE ACETONIDE 0.1 % EX CREA: 1.0000 "application " | TOPICAL_CREAM | Freq: Two times a day (BID) | CUTANEOUS | 0 refills | Status: DC

## 2015-10-16 MED ORDER — HYDROCODONE-ACETAMINOPHEN 10-325 MG PO TABS: ORAL_TABLET | ORAL | 0 refills | Status: DC

## 2015-10-18 ENCOUNTER — Encounter: Payer: Self-pay | Admitting: Physician Assistant

## 2015-10-18 DIAGNOSIS — L309 Dermatitis, unspecified: Secondary | ICD-10-CM | POA: Insufficient documentation

## 2015-10-18 NOTE — Progress Notes (Signed)
   Subjective:    Patient ID: Walter Wilkerson, male    DOB: October 14, 1950, 65 y.o.   MRN: [id number]  HPI Pt comes in for 1 week follow up on biopsy results and suture removal.    Review of Systems  All other systems reviewed and are negative.      Objective:   Physical Exam  Skin:  Well healed wound. Removed one stitch without complication.  Still raised around biopsy site. Non tender.           Assessment & Plan:  suppurative granulomatos dermatitis- removed suture. Discussed benign nature of results.there have been links to some lung cancers. Last CXR was done 02/2015 with no abnormalities. Discussed getting chest xray. Pt declines and will wait until next feb at yearly appt. He is not having any abnormal symptoms. Given triamcinolone twice a day over remainder of lesion.

## 2015-11-04 ENCOUNTER — Ambulatory Visit (INDEPENDENT_AMBULATORY_CARE_PROVIDER_SITE_OTHER): Payer: Commercial Managed Care - PPO | Admitting: Physician Assistant

## 2015-11-04 ENCOUNTER — Encounter: Payer: Self-pay | Admitting: Physician Assistant

## 2015-11-04 VITALS — BP 151/86 | HR 82 | Temp 98.4°F | Ht 69.5 in | Wt 181.0 lb

## 2015-11-04 DIAGNOSIS — C3491 Malignant neoplasm of unspecified part of right bronchus or lung: Secondary | ICD-10-CM

## 2015-11-04 DIAGNOSIS — J0100 Acute maxillary sinusitis, unspecified: Secondary | ICD-10-CM

## 2015-11-04 DIAGNOSIS — J209 Acute bronchitis, unspecified: Secondary | ICD-10-CM

## 2015-11-04 MED ORDER — HYDROCODONE-HOMATROPINE 5-1.5 MG/5ML PO SYRP: 5.0000 mL | ORAL_SOLUTION | Freq: Every evening | ORAL | 0 refills | Status: DC | PRN

## 2015-11-04 MED ORDER — AZITHROMYCIN 250 MG PO TABS: ORAL_TABLET | ORAL | 0 refills | Status: DC

## 2015-11-04 NOTE — Progress Notes (Signed)
   Subjective:    Patient ID: Walter Wilkerson, male    DOB: [birth year], 65 y.o.   MRN: [id number]  HPI Pt is a 65 yo male who presents to the clinic with cough for 4 days. 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 He has not taken anything OTC. Denies any fever. Cough is productive with sinus pressure and ear pain. No ST, fever, chills, n/v/d. He is SOB but no wheezing. Denies any leg pain or swelling.    Review of Systems See HPI.     Objective:   Physical Exam  Constitutional: He is oriented to person, place, and time. He appears well-developed and well-nourished.  HENT:  Head: Normocephalic and atraumatic.  Right Ear: External ear normal.  Left Ear: External ear normal.  Mouth/Throat: Oropharynx is clear and moist. No oropharyngeal exudate.  TM"s erythematous, dull and bulging.  Tenderness over maxillary sinuses to palpation.  Bilateral nasal turbinates red and swollen.   Eyes: Conjunctivae are normal. Right eye exhibits no discharge. Left eye exhibits no discharge.  Neck: Normal range of motion. Neck supple.  Cardiovascular: Normal rate, regular rhythm and normal heart sounds.   Pulmonary/Chest: Effort normal.  Rhonchi over left lung.  No breath sounds over right lower lung.  No wheezing.   Lymphadenopathy:    He has no cervical adenopathy.  Neurological: He is alert and oriented to person, place, and time.  Skin:  No swelling of lower extremities.   Psychiatric: He has a normal mood and affect. His behavior is normal.          Assessment & Plan:  [name] KitchenSallie was seen today for cough, headache and sore throat.  Diagnoses and all orders for this visit:  Acute bronchitis, unspecified organism -     HYDROcodone-homatropine (HYCODAN) 5-1.5 MG/5ML syrup; Take 5 mLs by mouth at bedtime as needed. -     azithromycin (ZITHROMAX Z-PAK) 250 MG tablet; Take 2 tablets (500 mg) on  Day 1,  followed by 1 tablet (250 mg) once daily on Days 2 through  5.  Acute maxillary sinusitis, recurrence not specified -     azithromycin (ZITHROMAX Z-PAK) 250 MG tablet; Take 2 tablets (500 mg) on  Day 1,  followed by 1 tablet (250 mg) once daily on Days 2 through 5.  Non-small cell cancer of right lung (HCC)   Cough syrup given for bedtime cough only. During the day suck on cough drops.  Due to hx of lung cancer zpak was given to take if not improving in 24 hours.  Rest and hydrate.  Suggested tylenol cold and sinus OTC.

## 2015-11-04 NOTE — Patient Instructions (Signed)
Tylenol Cold Sinus Severe    Upper Respiratory Infection, Adult Most upper respiratory infections (URIs) are a viral infection of the air passages leading to the lungs. A URI affects the nose, throat, and upper air passages. The most common type of URI is nasopharyngitis and is typically referred to as "the common cold." URIs run their course and usually go away on their own. Most of the time, a URI does not require medical attention, but sometimes a bacterial infection in the upper airways can follow a viral infection. This is called a secondary infection. Sinus and middle ear infections are common types of secondary upper respiratory infections. Bacterial pneumonia can also complicate a URI. A URI can worsen asthma and chronic obstructive pulmonary disease (COPD). Sometimes, these complications can require emergency medical care and may be life threatening.  CAUSES Almost all URIs are caused by viruses. A virus is a type of germ and can spread from one person to another.  RISKS FACTORS You may be at risk for a URI if:   You smoke.   You have chronic heart or lung disease.  You have a weakened defense (immune) system.   You are very young or very old.   You have nasal allergies or asthma.  You work in crowded or poorly ventilated areas.  You work in health care facilities or schools. SIGNS AND SYMPTOMS  Symptoms typically develop 2-3 days after you come in contact with a cold virus. Most viral URIs last 7-10 days. However, viral URIs from the influenza virus (flu virus) can last 14-18 days and are typically more severe. Symptoms may include:   Runny or stuffy (congested) nose.   Sneezing.   Cough.   Sore throat.   Headache.   Fatigue.   Fever.   Loss of appetite.   Pain in your forehead, behind your eyes, and over your cheekbones (sinus pain).  Muscle aches.  DIAGNOSIS  Your health care provider may diagnose a URI by:  Physical exam.  Tests to check  that your symptoms are not due to another condition such as:  Strep throat.  Sinusitis.  Pneumonia.  Asthma. TREATMENT  A URI goes away on its own with time. It cannot be cured with medicines, but medicines may be prescribed or recommended to relieve symptoms. Medicines may help:  Reduce your fever.  Reduce your cough.  Relieve nasal congestion. HOME CARE INSTRUCTIONS   Take medicines only as directed by your health care provider.   Gargle warm saltwater or take cough drops to comfort your throat as directed by your health care provider.  Use a warm mist humidifier or inhale steam from a shower to increase air moisture. This may make it easier to breathe.  Drink enough fluid to keep your urine clear or pale yellow.   Eat soups and other clear broths and maintain good nutrition.   Rest as needed.   Return to work when your temperature has returned to normal or as your health care provider advises. You may need to stay home longer to avoid infecting others. You can also use a face mask and careful hand washing to prevent spread of the virus.  Increase the usage of your inhaler if you have asthma.   Do not use any tobacco products, including cigarettes, chewing tobacco, or electronic cigarettes. If you need help quitting, ask your health care provider. PREVENTION  The best way to protect yourself from getting a cold is to practice good hygiene.   Avoid oral  or hand contact with people with cold symptoms.   Wash your hands often if contact occurs.  There is no clear evidence that vitamin C, vitamin E, echinacea, or exercise reduces the chance of developing a cold. However, it is always recommended to get plenty of rest, exercise, and practice good nutrition.  SEEK MEDICAL CARE IF:   You are getting worse rather than better.   Your symptoms are not controlled by medicine.   You have chills.  You have worsening shortness of breath.  You have brown or red  mucus.  You have yellow or brown nasal discharge.  You have pain in your face, especially when you bend forward.  You have a fever.  You have swollen neck glands.  You have pain while swallowing.  You have white areas in the back of your throat. SEEK IMMEDIATE MEDICAL CARE IF:   You have severe or persistent:  Headache.  Ear pain.  Sinus pain.  Chest pain.  You have chronic lung disease and any of the following:  Wheezing.  Prolonged cough.  Coughing up blood.  A change in your usual mucus.  You have a stiff neck.  You have changes in your:  Vision.  Hearing.  Thinking.  Mood. MAKE SURE YOU:   Understand these instructions.  Will watch your condition.  Will get help right away if you are not doing well or get worse.   This information is not intended to replace advice given to you by your health care provider. Make sure you discuss any questions you have with your health care provider.   Document Released: 06/22/2000 Document Revised: 05/13/2014 Document Reviewed: 04/03/2013 Elsevier Interactive Patient Education [employer].

## 2015-11-16 ENCOUNTER — Other Ambulatory Visit: Payer: Self-pay | Admitting: *Deleted

## 2015-11-16 DIAGNOSIS — M79643 Pain in unspecified hand: Secondary | ICD-10-CM

## 2015-11-16 MED ORDER — HYDROCODONE-ACETAMINOPHEN 10-325 MG PO TABS: ORAL_TABLET | ORAL | 0 refills | Status: DC

## 2015-12-17 ENCOUNTER — Other Ambulatory Visit: Payer: Self-pay | Admitting: *Deleted

## 2015-12-17 DIAGNOSIS — M79643 Pain in unspecified hand: Secondary | ICD-10-CM

## 2015-12-17 MED ORDER — HYDROCODONE-ACETAMINOPHEN 10-325 MG PO TABS: ORAL_TABLET | ORAL | 0 refills | Status: DC

## 2015-12-28 ENCOUNTER — Other Ambulatory Visit: Payer: Self-pay | Admitting: Physician Assistant

## 2015-12-29 NOTE — Telephone Encounter (Signed)
Is this refill appropriate?  

## 2016-01-12 ENCOUNTER — Encounter: Payer: Self-pay | Admitting: Physician Assistant

## 2016-01-12 ENCOUNTER — Ambulatory Visit (INDEPENDENT_AMBULATORY_CARE_PROVIDER_SITE_OTHER): Payer: Medicare Other

## 2016-01-12 ENCOUNTER — Ambulatory Visit (INDEPENDENT_AMBULATORY_CARE_PROVIDER_SITE_OTHER): Payer: Medicare Other | Admitting: Physician Assistant

## 2016-01-12 VITALS — BP 145/86 | HR 61 | Temp 97.5°F | Wt 186.0 lb

## 2016-01-12 DIAGNOSIS — J392 Other diseases of pharynx: Secondary | ICD-10-CM | POA: Diagnosis not present

## 2016-01-12 DIAGNOSIS — J90 Pleural effusion, not elsewhere classified: Secondary | ICD-10-CM

## 2016-01-12 DIAGNOSIS — J069 Acute upper respiratory infection, unspecified: Secondary | ICD-10-CM

## 2016-01-12 DIAGNOSIS — I700 Atherosclerosis of aorta: Secondary | ICD-10-CM | POA: Insufficient documentation

## 2016-01-12 MED ORDER — AZITHROMYCIN 250 MG PO TABS: ORAL_TABLET | ORAL | 0 refills | Status: DC

## 2016-01-12 MED ORDER — IPRATROPIUM BROMIDE 0.06 % NA SOLN: 1.0000 | Freq: Four times a day (QID) | NASAL | 0 refills | Status: DC | PRN

## 2016-01-12 NOTE — Patient Instructions (Signed)
Upper Respiratory Infection, Adult Most upper respiratory infections (URIs) are a viral infection of the air passages leading to the lungs. A URI affects the nose, throat, and upper air passages. The most common type of URI is nasopharyngitis and is typically referred to as "the common cold." URIs run their course and usually go away on their own. Most of the time, a URI does not require medical attention, but sometimes a bacterial infection in the upper airways can follow a viral infection. This is called a secondary infection. Sinus and middle ear infections are common types of secondary upper respiratory infections. Bacterial pneumonia can also complicate a URI. A URI can worsen asthma and chronic obstructive pulmonary disease (COPD). Sometimes, these complications can require emergency medical care and may be life threatening. What are the causes? Almost all URIs are caused by viruses. A virus is a type of germ and can spread from one person to another. What increases the risk? You may be at risk for a URI if:  You smoke.  You have chronic heart or lung disease.  You have a weakened defense (immune) system.  You are very young or very old.  You have nasal allergies or asthma.  You work in crowded or poorly ventilated areas.  You work in health care facilities or schools.  What are the signs or symptoms? Symptoms typically develop 2-3 days after you come in contact with a cold virus. Most viral URIs last 7-10 days. However, viral URIs from the influenza virus (flu virus) can last 14-18 days and are typically more severe. Symptoms may include:  Runny or stuffy (congested) nose.  Sneezing.  Cough.  Sore throat.  Headache.  Fatigue.  Fever.  Loss of appetite.  Pain in your forehead, behind your eyes, and over your cheekbones (sinus pain).  Muscle aches.  How is this diagnosed? Your health care provider may diagnose a URI by:  Physical exam.  Tests to check that your  symptoms are not due to another condition such as: ? Strep throat. ? Sinusitis. ? Pneumonia. ? Asthma.  How is this treated? A URI goes away on its own with time. It cannot be cured with medicines, but medicines may be prescribed or recommended to relieve symptoms. Medicines may help:  Reduce your fever.  Reduce your cough.  Relieve nasal congestion.  Follow these instructions at home:  Take medicines only as directed by your health care provider.  Gargle warm saltwater or take cough drops to comfort your throat as directed by your health care provider.  Use a warm mist humidifier or inhale steam from a shower to increase air moisture. This may make it easier to breathe.  Drink enough fluid to keep your urine clear or pale yellow.  Eat soups and other clear broths and maintain good nutrition.  Rest as needed.  Return to work when your temperature has returned to normal or as your health care provider advises. You may need to stay home longer to avoid infecting others. You can also use a face mask and careful hand washing to prevent spread of the virus.  Increase the usage of your inhaler if you have asthma.  Do not use any tobacco products, including cigarettes, chewing tobacco, or electronic cigarettes. If you need help quitting, ask your health care provider. How is this prevented? The best way to protect yourself from getting a cold is to practice good hygiene.  Avoid oral or hand contact with people with cold symptoms.  Wash your   hands often if contact occurs.  There is no clear evidence that vitamin C, vitamin E, echinacea, or exercise reduces the chance of developing a cold. However, it is always recommended to get plenty of rest, exercise, and practice good nutrition. Contact a health care provider if:  You are getting worse rather than better.  Your symptoms are not controlled by medicine.  You have chills.  You have worsening shortness of breath.  You have  brown or red mucus.  You have yellow or brown nasal discharge.  You have pain in your face, especially when you bend forward.  You have a fever.  You have swollen neck glands.  You have pain while swallowing.  You have white areas in the back of your throat. Get help right away if:  You have severe or persistent: ? Headache. ? Ear pain. ? Sinus pain. ? Chest pain.  You have chronic lung disease and any of the following: ? Wheezing. ? Prolonged cough. ? Coughing up blood. ? A change in your usual mucus.  You have a stiff neck.  You have changes in your: ? Vision. ? Hearing. ? Thinking. ? Mood. This information is not intended to replace advice given to you by your health care provider. Make sure you discuss any questions you have with your health care provider. Document Released: 06/22/2000 Document Revised: 08/30/2015 Document Reviewed: 04/03/2013 Elsevier Interactive Patient Education  2017 Elsevier Inc.  

## 2016-01-12 NOTE — Progress Notes (Signed)
Subjective:    Patient ID: Walter Wilkerson, male    DOB: Dec 16, 1950, 66 y.o.   MRN: [id number]  Patient has history of NSCLC with resection of right lower lobe in 2008. Symptoms began on Saturday afternoon. Nonproductive cough and sore throat worse at night. Denies epistaxis and hemoptysis. Denies fever, chills, nausea or vomiting. Sick contacts include his wife with similar symptoms. Patient states he gets this about twice a year and feels improved with a Z-pak.   Cough  This is a new problem. Associated symptoms include nasal congestion, postnasal drip, rhinorrhea and a sore throat. Pertinent negatives include no chest pain, chills, fever, shortness of breath or wheezing. The symptoms are aggravated by lying down. He has tried OTC cough suppressant and rest (hot shower/steam) for the symptoms. The treatment provided mild relief. There is no history of asthma or COPD.      Review of Systems  Constitutional: Negative for chills and fever.  HENT: Positive for postnasal drip, rhinorrhea and sore throat.   Respiratory: Positive for cough. Negative for shortness of breath and wheezing.   Cardiovascular: Negative for chest pain.  Gastrointestinal: Negative for nausea and vomiting.  Psychiatric/Behavioral: Negative.    Past Medical History:  Diagnosis Date  . Hypertension   . Lung cancer ([place])   . Thyroid disease    Past Surgical History:  Procedure Laterality Date  . LUNG LOBECTOMY Right 2008   RLL resection  . VASECTOMY     Current Outpatient Prescriptions on File Prior to Visit  Medication Sig Dispense Refill  . diclofenac (VOLTAREN) 75 MG EC tablet TAKE ONE TABLET BY MOUTH 2 TIMES A DAY 180 tablet 1  . HYDROcodone-acetaminophen (NORCO) 10-325 MG tablet One by mouth every 6-8 hours as needed for pain. 120 tablet 0  . levothyroxine (SYNTHROID, LEVOTHROID) 112 MCG tablet Take 2 tablets (224 mcg total) by mouth daily. 180 tablet 1  . valsartan-hydrochlorothiazide (DIOVAN-HCT) 160-25  MG tablet Take 1 tablet by mouth daily. 90 tablet 1  . zoster vaccine live, PF, (ZOSTAVAX) 59163 UNT/0.65ML injection Inject 19,400 Units into the skin once. (Patient not taking: Reported on 01/12/2016) 1 each 0   No current facility-administered medications on file prior to visit.       Objective:   Physical Exam  Constitutional: He does not appear ill. No distress.  HENT:  Right Ear: Tympanic membrane and ear canal normal.  Left Ear: Tympanic membrane and ear canal normal.  Nose: Mucosal edema present. No septal deviation. No epistaxis.  Mouth/Throat: Uvula is midline, oropharynx is clear and moist and mucous membranes are normal.    Eyes: Conjunctivae, EOM and lids are normal.  Neck: Phonation normal.  Cardiovascular: Normal rate, regular rhythm, S1 normal and S2 normal.  Exam reveals distant heart sounds.   No murmur heard. Pulmonary/Chest: Effort normal. He has decreased breath sounds. He has no wheezes. He has no rhonchi. He has no rales.  Breath sounds absent over RLF  Lymphadenopathy:       Head (right side): No tonsillar adenopathy present.       Head (left side): No tonsillar adenopathy present.    He has no cervical adenopathy.  Neurological: He is alert.  Skin: Skin is warm and dry. No rash noted.   Vitals:   01/12/16 1417  BP: (!) 145/86  Pulse: 61  Temp: 97.5 F (36.4 C)   EXAM: CHEST  2 VIEW  COMPARISON:  Radiographs of February 11, 2015.  FINDINGS: The heart size and mediastinal  contours are within normal limits. Atherosclerosis of thoracic aorta is noted. No pneumothorax is noted. Stable left basilar calcified granuloma. Status post right lower lobectomy. Stable possible loculated right pleural effusion. Stable old right rib fractures. Stable degenerative changes noted in thoracic spine.  IMPRESSION: Aortic atherosclerosis. Status post right lower lobectomy. Stable possible small loculated right pleural effusion. No significant change compared to  prior exam.   Electronically Signed   By: Marijo Conception, M.D.   On: 01/12/2016 14:59      Assessment & Plan:  1. Acute upper respiratory infection - DG Chest 2 View performed today negative for new infiltrate. likely viral bronchitis, but patient is requesting a Z-pak and has risk factors for lung disease - azithromycin (ZITHROMAX Z-PAK) 250 MG tablet; Take 2 tablets (500 mg) on  Day 1,  followed by 1 tablet (250 mg) once daily on Days 2 through 5.  Dispense: 6 tablet; Refill: 0 - ipratropium (ATROVENT) 0.06 % nasal spray; Place 1 spray into both nostrils 4 (four) times daily as needed for rhinitis.  Dispense: 15 mL; Refill: 0  2. Cyst of pharynx - instructed patient to monitor for changes in size or appearance. Not currently symptomatic. Up to date with dental exams. Watchful waiting. Will refer to ENT if there is a change.  Patient education and anticipatory guidance given Patient agrees with treatment plan Follow-up as needed if symptoms worsen or fail to improve  Darlyne Russian PA-C

## 2016-01-15 NOTE — Progress Notes (Signed)
Call pt: no significant changes since last CXR. There was some plaque accumulation in thoracic aorta. In this situation to reduce risk we suggest daily statin therapy. Are you ok with this?

## 2016-01-18 ENCOUNTER — Other Ambulatory Visit: Payer: Self-pay | Admitting: *Deleted

## 2016-01-18 ENCOUNTER — Other Ambulatory Visit: Payer: Self-pay | Admitting: Physician Assistant

## 2016-01-18 DIAGNOSIS — M79643 Pain in unspecified hand: Secondary | ICD-10-CM

## 2016-01-18 MED ORDER — ATORVASTATIN CALCIUM 40 MG PO TABS: 40.0000 mg | ORAL_TABLET | Freq: Every day | ORAL | 3 refills | Status: DC

## 2016-01-18 MED ORDER — HYDROCODONE-ACETAMINOPHEN 10-325 MG PO TABS: ORAL_TABLET | ORAL | 0 refills | Status: DC

## 2016-01-18 NOTE — Progress Notes (Signed)
If have any myalgias please contact office.  Lipitor '40mg'$  sent.

## 2016-02-04 ENCOUNTER — Other Ambulatory Visit: Payer: Self-pay | Admitting: Physician Assistant

## 2016-02-04 DIAGNOSIS — R7303 Prediabetes: Secondary | ICD-10-CM

## 2016-02-04 DIAGNOSIS — R7989 Other specified abnormal findings of blood chemistry: Secondary | ICD-10-CM

## 2016-02-04 DIAGNOSIS — I10 Essential (primary) hypertension: Secondary | ICD-10-CM

## 2016-02-04 DIAGNOSIS — Z1321 Encounter for screening for nutritional disorder: Secondary | ICD-10-CM

## 2016-02-04 DIAGNOSIS — E039 Hypothyroidism, unspecified: Secondary | ICD-10-CM

## 2016-02-04 DIAGNOSIS — E785 Hyperlipidemia, unspecified: Secondary | ICD-10-CM

## 2016-02-04 DIAGNOSIS — Z0000 Encounter for general adult medical examination without abnormal findings: Secondary | ICD-10-CM

## 2016-02-09 LAB — CBC WITH DIFFERENTIAL/PLATELET
Basophils Absolute: 59 cells/uL (ref 0–200)
Basophils Relative: 1 %
EOS ABS: 118 {cells}/uL (ref 15–500)
Eosinophils Relative: 2 %
HEMATOCRIT: 39.4 % (ref 38.5–50.0)
HEMOGLOBIN: 12.9 g/dL — AB (ref 13.2–17.1)
LYMPHS ABS: 1298 {cells}/uL (ref 850–3900)
Lymphocytes Relative: 22 %
MCH: 31.8 pg (ref 27.0–33.0)
MCHC: 32.7 g/dL (ref 32.0–36.0)
MCV: 97 fL (ref 80.0–100.0)
MONO ABS: 472 {cells}/uL (ref 200–950)
MPV: 11.4 fL (ref 7.5–12.5)
Monocytes Relative: 8 %
NEUTROS PCT: 67 %
Neutro Abs: 3953 cells/uL (ref 1500–7800)
Platelets: 192 10*3/uL (ref 140–400)
RBC: 4.06 MIL/uL — ABNORMAL LOW (ref 4.20–5.80)
RDW: 14.3 % (ref 11.0–15.0)
WBC: 5.9 10*3/uL (ref 3.8–10.8)

## 2016-02-09 LAB — COMPLETE METABOLIC PANEL WITH GFR
ALBUMIN: 4.1 g/dL (ref 3.6–5.1)
ALK PHOS: 39 U/L — AB (ref 40–115)
ALT: 13 U/L (ref 9–46)
AST: 19 U/L (ref 10–35)
BUN: 24 mg/dL (ref 7–25)
CO2: 27 mmol/L (ref 20–31)
CREATININE: 1.36 mg/dL — AB (ref 0.70–1.25)
Calcium: 9.5 mg/dL (ref 8.6–10.3)
Chloride: 103 mmol/L (ref 98–110)
GFR, Est African American: 62 mL/min (ref >=60)
GFR, Est Non African American: 54 mL/min — ABNORMAL LOW (ref >=60)
Glucose, Bld: 130 mg/dL — ABNORMAL HIGH (ref 65–99)
POTASSIUM: 4.7 mmol/L (ref 3.5–5.3)
Sodium: 138 mmol/L (ref 135–146)
Total Bilirubin: 0.6 mg/dL (ref 0.2–1.2)
Total Protein: 7.6 g/dL (ref 6.1–8.1)

## 2016-02-09 LAB — VITAMIN B12: Vitamin B-12: 1013 pg/mL (ref 200–1100)

## 2016-02-09 LAB — HEMOGLOBIN A1C
Hgb A1c MFr Bld: 5.1 % (ref <5.7)
Mean Plasma Glucose: 100 mg/dL

## 2016-02-09 LAB — LIPID PANEL
CHOL/HDL RATIO: 1.5 ratio (ref <5.0)
CHOLESTEROL: 175 mg/dL (ref <200)
HDL: 117 mg/dL (ref >40)
LDL Cholesterol: 49 mg/dL (ref <100)
TRIGLYCERIDES: 44 mg/dL (ref <150)
VLDL: 9 mg/dL (ref <30)

## 2016-02-09 LAB — TSH: TSH: 10.64 m[IU]/L — AB (ref 0.40–4.50)

## 2016-02-10 DIAGNOSIS — R7989 Other specified abnormal findings of blood chemistry: Secondary | ICD-10-CM | POA: Insufficient documentation

## 2016-02-10 MED ORDER — LEVOTHYROXINE SODIUM 125 MCG PO TABS: 125.0000 ug | ORAL_TABLET | Freq: Every day | ORAL | 1 refills | Status: DC

## 2016-02-10 NOTE — Addendum Note (Signed)
Addended by: [name] on: 02/10/2016 11:17 PM   Modules accepted: Orders

## 2016-02-15 ENCOUNTER — Ambulatory Visit (INDEPENDENT_AMBULATORY_CARE_PROVIDER_SITE_OTHER): Payer: Medicare Other | Admitting: Physician Assistant

## 2016-02-15 ENCOUNTER — Encounter: Payer: Self-pay | Admitting: Physician Assistant

## 2016-02-15 VITALS — BP 152/82 | HR 80 | Ht 69.5 in | Wt 179.0 lb

## 2016-02-15 DIAGNOSIS — I10 Essential (primary) hypertension: Secondary | ICD-10-CM

## 2016-02-15 DIAGNOSIS — R7989 Other specified abnormal findings of blood chemistry: Secondary | ICD-10-CM

## 2016-02-15 DIAGNOSIS — E039 Hypothyroidism, unspecified: Secondary | ICD-10-CM | POA: Diagnosis not present

## 2016-02-15 NOTE — Patient Instructions (Addendum)
Get kidney function rechecked in 2 weeks.  Stop diclofenac.  Start levothyroxine 162mg first thing in the am.    Iron-Rich Diet Introduction Iron is a mineral that helps your body to produce hemoglobin. Hemoglobin is a protein in your red blood cells that carries oxygen to your body's tissues. Eating too little iron may cause you to feel weak and tired, and it can increase your risk for infection. Eating enough iron is necessary for your body's metabolism, muscle function, and nervous system. Iron is naturally found in many foods. It can also be added to foods or fortified in foods. There are two types of dietary iron:  Heme iron. Heme iron is absorbed by the body more easily than nonheme iron. Heme iron is found in meat, poultry, and fish.  Nonheme iron. Nonheme iron is found in dietary supplements, iron-fortified grains, beans, and vegetables. You may need to follow an iron-rich diet if:  You have been diagnosed with iron deficiency or iron-deficiency anemia.  You have a condition that prevents you from absorbing dietary iron, such as:  Infection in your intestines.  Celiac disease. This involves long-lasting (chronic) inflammation of your intestines.  You do not eat enough iron.  You eat a diet that is high in foods that impair iron absorption.  You have lost a lot of blood.  You have heavy bleeding during your menstrual cycle.  You are pregnant. What is my plan? Your health care provider may help you to determine how much iron you need per day based on your condition. Generally, when a person consumes sufficient amounts of iron in the diet, the following iron needs are met:  Men.  [age]years old: 11 mg per day.  [age]51years old: 8 mg per day.  Women.  121122years old: 15 mg per day.  172525years old: 18 mg per day.  Over 523years old: 8 mg per day.  Pregnant women: 27 mg per day.  Breastfeeding women: 9 mg per day. What do I need to know about an iron-rich  diet?  Eat fresh fruits and vegetables that are high in vitamin C along with foods that are high in iron. This will help increase the amount of iron that your body absorbs from food, especially with foods containing nonheme iron. Foods that are high in vitamin C include oranges, peppers, tomatoes, and mango.  Take iron supplements only as directed by your health care provider. Overdose of iron can be life-threatening. If you were prescribed iron supplements, take them with orange juice or a vitamin C supplement.  Cook foods in pots and pans that are made from iron.  Eat nonheme iron-containing foods alongside foods that are high in heme iron. This helps to improve your iron absorption.  Certain foods and drinks contain compounds that impair iron absorption. Avoid eating these foods in the same meal as iron-rich foods or with iron supplements. These include:  Coffee, black tea, and red wine.  Milk, dairy products, and foods that are high in calcium.  Beans, soybeans, and peas.  Whole grains.  When eating foods that contain both nonheme iron and compounds that impair iron absorption, follow these tips to absorb iron better.  Soak beans overnight before cooking.  Soak whole grains overnight and drain them before using.  Ferment flours before baking, such as using yeast in bread dough. What foods can I eat? Grains  Iron-fortified breakfast cereal. Iron-fortified whole-wheat bread. Enriched rice. Sprouted grains. Vegetables  Spinach. Potatoes with skin.  Green peas. Broccoli. Red and green bell peppers. Fermented vegetables. Fruits  Prunes. Raisins. Oranges. Strawberries. Mango. Grapefruit. Meats and Other Protein Sources  Beef liver. Oysters. Beef. Shrimp. [place]. Chicken. [place]. Sardines. Chickpeas. Nuts. Tofu. Beverages  Tomato juice. Fresh orange juice. Prune juice. Hibiscus tea. Fortified instant breakfast shakes. Condiments  Tahini. Fermented soy sauce. Sweets and Desserts   Black-strap molasses. Other  Wheat germ. The items listed above may not be a complete list of recommended foods or beverages. Contact your dietitian for more options.  What foods are not recommended? Grains  Whole grains. Bran cereal. Bran flour. Oats. Vegetables  Artichokes. Brussels sprouts. Kale. Fruits  Blueberries. Raspberries. Strawberries. Figs. Meats and Other Protein Sources  Soybeans. Products made from soy protein. Dairy  Milk. Cream. Cheese. Yogurt. Cottage cheese. Beverages  Coffee. Black tea. Red wine. Sweets and Desserts  Cocoa. Chocolate. Ice cream. Other  Basil. Oregano. Parsley. The items listed above may not be a complete list of foods and beverages to avoid. Contact your dietitian for more information.  This information is not intended to replace advice given to you by your health care provider. Make sure you discuss any questions you have with your health care provider. Document Released: 08/10/2004 Document Revised: 07/17/2015 Document Reviewed: 07/24/2013  2017 Elsevier

## 2016-02-17 ENCOUNTER — Encounter: Payer: Self-pay | Admitting: Physician Assistant

## 2016-02-17 MED ORDER — VALSARTAN-HYDROCHLOROTHIAZIDE 160-25 MG PO TABS: 1.0000 | ORAL_TABLET | Freq: Every day | ORAL | 1 refills | Status: DC

## 2016-02-17 NOTE — Progress Notes (Signed)
   Subjective:    Patient ID: Joandry Slagter, male    DOB: [birth year], 66 y.o.   MRN: [id number]  HPI Pt is a 66 yo male who presents to the clinic to review labs.   Hypothyroidism- taking medication daily. TSH was elevated on recheck.   HTN- denies any chest pain, palpitations, headaches, dizziness. Taking diovan/HCT but forgot to take this am.   Elevated Serum creatine on exam.    Review of Systems  All other systems reviewed and are negative.      Objective:   Physical Exam  Constitutional: He is oriented to person, place, and time. He appears well-developed and well-nourished.  HENT:  Head: Normocephalic and atraumatic.  Cardiovascular: Normal rate, regular rhythm and normal heart sounds.   Pulmonary/Chest: Effort normal and breath sounds normal.  Neurological: He is alert and oriented to person, place, and time.  Psychiatric: He has a normal mood and affect. His behavior is normal.          Assessment & Plan:  [name] KitchenDiagnoses and all orders for this visit:  Essential hypertension -     valsartan-hydrochlorothiazide (DIOVAN-HCT) 160-25 MG tablet; Take 1 tablet by mouth daily.  High serum creatine -     Basic metabolic panel  Hypothyroidism, unspecified type    BP went down on 2nd recheck.  Encouraged to start taking medication.  Keep monitoring BP.  Recheck in 2 weeks.  Recheck BMP to evaluate kidney function.  Increased levothyroxine to 121mg recheck in 4-6 weeks.

## 2016-02-18 ENCOUNTER — Other Ambulatory Visit: Payer: Self-pay | Admitting: *Deleted

## 2016-02-18 DIAGNOSIS — M79643 Pain in unspecified hand: Secondary | ICD-10-CM

## 2016-02-18 MED ORDER — HYDROCODONE-ACETAMINOPHEN 10-325 MG PO TABS: ORAL_TABLET | ORAL | 0 refills | Status: DC

## 2016-02-23 ENCOUNTER — Telehealth: Payer: Self-pay | Admitting: Physician Assistant

## 2016-02-23 MED ORDER — DICLOFENAC SODIUM 1 % TD GEL: 4.0000 g | Freq: Four times a day (QID) | TRANSDERMAL | 2 refills | Status: DC

## 2016-02-23 MED ORDER — GABAPENTIN 100 MG PO CAPS: 100.0000 mg | ORAL_CAPSULE | Freq: Three times a day (TID) | ORAL | 1 refills | Status: DC

## 2016-02-23 NOTE — Telephone Encounter (Signed)
Pt had to stop diclofenac oral due to rise is serum creatine. He is in a lot of pain. Sent over diclofenac gel and gabapentin. He will follow up this month.

## 2016-03-01 LAB — BASIC METABOLIC PANEL
BUN: 17 mg/dL (ref 7–25)
CALCIUM: 9.5 mg/dL (ref 8.6–10.3)
CHLORIDE: 105 mmol/L (ref 98–110)
CO2: 25 mmol/L (ref 20–31)
CREATININE: 1.21 mg/dL (ref 0.70–1.25)
Glucose, Bld: 86 mg/dL (ref 65–99)
Potassium: 4.1 mmol/L (ref 3.5–5.3)
Sodium: 140 mmol/L (ref 135–146)

## 2016-03-18 ENCOUNTER — Other Ambulatory Visit: Payer: Self-pay | Admitting: *Deleted

## 2016-03-18 DIAGNOSIS — M79643 Pain in unspecified hand: Secondary | ICD-10-CM

## 2016-03-18 MED ORDER — HYDROCODONE-ACETAMINOPHEN 10-325 MG PO TABS: ORAL_TABLET | ORAL | 0 refills | Status: DC

## 2016-04-18 ENCOUNTER — Other Ambulatory Visit: Payer: Self-pay | Admitting: *Deleted

## 2016-04-18 DIAGNOSIS — M79643 Pain in unspecified hand: Secondary | ICD-10-CM

## 2016-04-18 MED ORDER — HYDROCODONE-ACETAMINOPHEN 10-325 MG PO TABS: ORAL_TABLET | ORAL | 0 refills | Status: DC

## 2016-04-25 ENCOUNTER — Other Ambulatory Visit: Payer: Self-pay | Admitting: Physician Assistant

## 2016-05-19 ENCOUNTER — Other Ambulatory Visit: Payer: Self-pay | Admitting: *Deleted

## 2016-05-19 DIAGNOSIS — M79643 Pain in unspecified hand: Secondary | ICD-10-CM

## 2016-05-19 MED ORDER — HYDROCODONE-ACETAMINOPHEN 10-325 MG PO TABS: ORAL_TABLET | ORAL | 0 refills | Status: DC

## 2016-05-27 ENCOUNTER — Ambulatory Visit (INDEPENDENT_AMBULATORY_CARE_PROVIDER_SITE_OTHER): Payer: Medicare Other | Admitting: Physician Assistant

## 2016-05-27 ENCOUNTER — Encounter: Payer: Self-pay | Admitting: Physician Assistant

## 2016-05-27 ENCOUNTER — Other Ambulatory Visit: Payer: Self-pay | Admitting: Physician Assistant

## 2016-05-27 VITALS — BP 136/82 | HR 75 | Ht 69.5 in | Wt 172.0 lb

## 2016-05-27 DIAGNOSIS — I10 Essential (primary) hypertension: Secondary | ICD-10-CM | POA: Diagnosis not present

## 2016-05-27 DIAGNOSIS — E039 Hypothyroidism, unspecified: Secondary | ICD-10-CM | POA: Diagnosis not present

## 2016-05-27 DIAGNOSIS — R7303 Prediabetes: Secondary | ICD-10-CM

## 2016-05-27 DIAGNOSIS — G629 Polyneuropathy, unspecified: Secondary | ICD-10-CM

## 2016-05-27 DIAGNOSIS — I700 Atherosclerosis of aorta: Secondary | ICD-10-CM | POA: Diagnosis not present

## 2016-05-27 DIAGNOSIS — E7800 Pure hypercholesterolemia, unspecified: Secondary | ICD-10-CM

## 2016-05-27 DIAGNOSIS — R7989 Other specified abnormal findings of blood chemistry: Secondary | ICD-10-CM | POA: Diagnosis not present

## 2016-05-27 LAB — LIPID PANEL
CHOL/HDL RATIO: 1.6 ratio (ref <5.0)
Cholesterol: 164 mg/dL (ref <200)
HDL: 100 mg/dL (ref >40)
LDL Cholesterol: 54 mg/dL (ref <100)
Triglycerides: 48 mg/dL (ref <150)
VLDL: 10 mg/dL (ref <30)

## 2016-05-27 LAB — COMPLETE METABOLIC PANEL WITH GFR
ALT: 9 U/L (ref 9–46)
AST: 13 U/L (ref 10–35)
Albumin: 4 g/dL (ref 3.6–5.1)
Alkaline Phosphatase: 37 U/L — ABNORMAL LOW (ref 40–115)
BILIRUBIN TOTAL: 0.6 mg/dL (ref 0.2–1.2)
BUN: 15 mg/dL (ref 7–25)
CALCIUM: 9.5 mg/dL (ref 8.6–10.3)
CO2: 20 mmol/L (ref 20–31)
Chloride: 104 mmol/L (ref 98–110)
Creat: 1.16 mg/dL (ref 0.70–1.25)
GFR, EST AFRICAN AMERICAN: 75 mL/min (ref >=60)
GFR, Est Non African American: 65 mL/min (ref >=60)
Glucose, Bld: 94 mg/dL (ref 65–99)
Potassium: 4.3 mmol/L (ref 3.5–5.3)
Sodium: 139 mmol/L (ref 135–146)
TOTAL PROTEIN: 6.9 g/dL (ref 6.1–8.1)

## 2016-05-27 MED ORDER — GABAPENTIN 100 MG PO CAPS: 100.0000 mg | ORAL_CAPSULE | Freq: Three times a day (TID) | ORAL | 1 refills | Status: DC

## 2016-05-27 MED ORDER — VALSARTAN-HYDROCHLOROTHIAZIDE 160-25 MG PO TABS: 1.0000 | ORAL_TABLET | Freq: Every day | ORAL | 1 refills | Status: DC

## 2016-05-28 LAB — HEMOGLOBIN A1C
HEMOGLOBIN A1C: 5.3 % (ref <5.7)
MEAN PLASMA GLUCOSE: 105 mg/dL

## 2016-05-28 LAB — T4, FREE: FREE T4: 1.2 ng/dL (ref 0.8–1.8)

## 2016-05-28 LAB — TSH: TSH: 10.65 mIU/L — ABNORMAL HIGH (ref 0.40–4.50)

## 2016-05-29 ENCOUNTER — Encounter: Payer: Self-pay | Admitting: Physician Assistant

## 2016-05-29 NOTE — Progress Notes (Signed)
   Subjective:    Patient ID: Walter Wilkerson, male    DOB: November 30, 1950, 66 y.o.   MRN: [id number]  HPI Pt is a 66 yo male who presents to the clinic for medical refill.   .. Active Ambulatory Problems    Diagnosis Date Noted  . Thyroid activity decreased 08/11/2014  . Essential hypertension 08/11/2014  . Hyperlipidemia 08/11/2014  . Peripheral neuropathy ([place]) 08/11/2014  . Non-small cell lung cancer ([place]) 08/11/2014  . Shoulder impingement 02/24/2015  . Hyperglycemia 04/14/2015  . Prediabetes 04/15/2015  . Dermatitis 10/18/2015  . Cyst of pharynx 01/12/2016  . Aortic atherosclerosis ([place]) 01/12/2016  . Elevated serum creatinine 02/10/2016   Resolved Ambulatory Problems    Diagnosis Date Noted  . Acute bronchitis 01/08/2015   Past Medical History:  Diagnosis Date  . Hypertension   . Lung cancer ([place])   . Thyroid disease    .[name] Kitchen Family History  Problem Relation Age of Onset  . Heart attack Father   . Hyperlipidemia Father   . Hypertension Father   . Diabetes Unknown        grandmother    .[name] Kitchen Social History   Social History  . Marital status: Married    Spouse name: N/A  . Number of children: N/A  . Years of education: N/A   Occupational History  . Not on file.   Social History Main Topics  . Smoking status: Former Smoker    Quit date: 08/10/2004  . Smokeless tobacco: Never Used  . Alcohol use 3.0 oz/week    5 Standard drinks or equivalent per week  . Drug use: No  . Sexual activity: Yes    Partners: Female   Other Topics Concern  . Not on file   Social History Narrative  . No narrative on file      Review of Systems  All other systems reviewed and are negative.      Objective:   Physical Exam  Constitutional: He is oriented to person, place, and time. He appears well-developed and well-nourished.  HENT:  Head: Normocephalic and atraumatic.  Eyes: Right eye exhibits no discharge. Left eye exhibits no discharge.  Neck: Normal range of motion.  Neck supple.  Cardiovascular: Normal rate, regular rhythm and normal heart sounds.   Pulmonary/Chest: Effort normal.  Absent breath sound RLL due to resection.   Lymphadenopathy:    He has no cervical adenopathy.  Neurological: He is alert and oriented to person, place, and time.  Psychiatric: He has a normal mood and affect. His behavior is normal.          Assessment & Plan:  [name] KitchenKennen was seen today for hyperlipidemia, hypertension and hypothyroidism.  Diagnoses and all orders for this visit:  Hypothyroidism, unspecified type -     TSH -     T4, free  Essential hypertension -     valsartan-hydrochlorothiazide (DIOVAN-HCT) 160-25 MG tablet; Take 1 tablet by mouth daily. -     COMPLETE METABOLIC PANEL WITH GFR  Elevated serum creatinine -     COMPLETE METABOLIC PANEL WITH GFR  Prediabetes -     Hemoglobin A1c  Pure hypercholesterolemia -     Lipid panel  Aortic atherosclerosis ([facility])  Peripheral polyneuropathy -     gabapentin (NEURONTIN) 100 MG capsule; Take 1 capsule (100 mg total) by mouth 3 (three) times daily.   Pt feels great today! Refilled medications. Will adjust medications accordingly to labs.

## 2016-06-21 ENCOUNTER — Other Ambulatory Visit: Payer: Self-pay | Admitting: *Deleted

## 2016-06-21 DIAGNOSIS — M79643 Pain in unspecified hand: Secondary | ICD-10-CM

## 2016-06-21 MED ORDER — HYDROCODONE-ACETAMINOPHEN 10-325 MG PO TABS: ORAL_TABLET | ORAL | 0 refills | Status: DC

## 2016-07-21 ENCOUNTER — Other Ambulatory Visit: Payer: Self-pay | Admitting: *Deleted

## 2016-07-21 DIAGNOSIS — M79643 Pain in unspecified hand: Secondary | ICD-10-CM

## 2016-07-21 MED ORDER — HYDROCODONE-ACETAMINOPHEN 10-325 MG PO TABS: ORAL_TABLET | ORAL | 0 refills | Status: DC

## 2016-08-22 ENCOUNTER — Other Ambulatory Visit: Payer: Self-pay

## 2016-08-22 DIAGNOSIS — M79643 Pain in unspecified hand: Secondary | ICD-10-CM

## 2016-08-22 MED ORDER — HYDROCODONE-ACETAMINOPHEN 10-325 MG PO TABS: ORAL_TABLET | ORAL | 0 refills | Status: DC

## 2016-08-26 ENCOUNTER — Ambulatory Visit: Payer: Medicare Other | Admitting: Physician Assistant

## 2016-08-30 ENCOUNTER — Telehealth: Payer: Self-pay | Admitting: Physician Assistant

## 2016-08-30 NOTE — Telephone Encounter (Signed)
Please call patient and let him know about recall on valsartan. We need to switch him and if he has no concerns please call in losartan/HCTZ 100/25mg  once daily. Will recheck BP nurse visit in 2 weeks.

## 2016-08-30 NOTE — Telephone Encounter (Signed)
Pt states he was notified of the recall and contacted his pharmacy. His pharmacist advised his Rx was not from the affected batch. Pt is OK to continue taking Rx, does not desire new Rx to be sent in. Will route to PCP.

## 2016-08-30 NOTE — Telephone Encounter (Signed)
Ok, I got a Quarry manager from [facility] so I was following through. Certainly up to him.

## 2016-08-31 ENCOUNTER — Telehealth: Payer: Self-pay

## 2016-08-31 ENCOUNTER — Other Ambulatory Visit: Payer: Self-pay | Admitting: Physician Assistant

## 2016-08-31 DIAGNOSIS — E039 Hypothyroidism, unspecified: Secondary | ICD-10-CM

## 2016-08-31 NOTE — Addendum Note (Signed)
Addended by: Huel Cote on: 08/31/2016 03:45 PM   Modules accepted: Orders

## 2016-08-31 NOTE — Telephone Encounter (Signed)
PT needs an order for blood work before his appt on the 31st.

## 2016-08-31 NOTE — Telephone Encounter (Signed)
Ok to order TSH. All other labs up to date.

## 2016-08-31 NOTE — Telephone Encounter (Signed)
Lab ordered. Pt advised.

## 2016-09-02 ENCOUNTER — Ambulatory Visit: Payer: Medicare Other | Admitting: Physician Assistant

## 2016-09-02 ENCOUNTER — Ambulatory Visit (INDEPENDENT_AMBULATORY_CARE_PROVIDER_SITE_OTHER): Payer: Medicare Other | Admitting: Physician Assistant

## 2016-09-02 ENCOUNTER — Encounter: Payer: Self-pay | Admitting: Physician Assistant

## 2016-09-02 VITALS — BP 123/65 | HR 85 | Resp 16 | Wt 170.4 lb

## 2016-09-02 DIAGNOSIS — M7021 Olecranon bursitis, right elbow: Secondary | ICD-10-CM | POA: Diagnosis not present

## 2016-09-02 MED ORDER — AMOXICILLIN-POT CLAVULANATE 875-125 MG PO TABS: 1.0000 | ORAL_TABLET | Freq: Two times a day (BID) | ORAL | 0 refills | Status: DC

## 2016-09-02 NOTE — Patient Instructions (Signed)
Elbow Bursitis A bursa is a fluid-filled sac that covers and protects a joint. Bursitis is when the fluid-filled sac gets puffy and sore (inflamed). Elbow bursitis, also called olecranon bursitis, happens over your elbow. This may be caused by:  Injury (acute trauma) to your elbow.  Leaning on hard surfaces for long periods of time.  Infection from an injury that breaks the skin near your elbow.  A bone growth (spur) that forms at the tip of your elbow.  A medical condition that causes inflammation in your body, such as: ? Gout. ? Rheumatoid arthritis.  Sometimes the cause is not known. Follow these instructions at home:  Take medicines only as told by your doctor.  If you were prescribed an antibiotic medicine, finish all of it even if you start to feel better.  If your bursitis is caused by an injury, rest your elbow and wear your bandage as told by your doctor. You may also apply ice to the injured area as told by your doctor: ? Put ice in a plastic bag. ? Place a towel between your skin and the bag. ? Leave the ice on for 20 minutes, 2-3 times per day.  Do not do any activities that cause pain to your elbow.  Use elbow pads or wraps to cushion your elbow. Contact a doctor if:  You have a fever.  Your symptoms do not get better with treatment.  Your pain or swelling gets worse.  Your pain or swelling goes away and then comes back.  You have drainage of pus from the swollen area over your elbow. This information is not intended to replace advice given to you by your health care provider. Make sure you discuss any questions you have with your health care provider. Document Released: 06/16/2009 Document Revised: 06/04/2015 Document Reviewed: 09/04/2013 Elsevier Interactive Patient Education  [employer].

## 2016-09-02 NOTE — Progress Notes (Signed)
   Subjective:    Patient ID: Walter Wilkerson, male    DOB: 01/03/1951, 66 y.o.   MRN: [id number]  HPI  Pt is a 66 yo male who presents to the clinic with right elbow swelling and pain. He doesn't remember overtly hitting it but small bump appeared 5 days ago and then gradually started getting worse with more swelling. Yesterday started feeling hot to touch as well. He has been eating a lot of seafood recently due to being at the beach. Not tender to light touch but tender to deep palpitation.   .. Active Ambulatory Problems    Diagnosis Date Noted  . Thyroid activity decreased 08/11/2014  . Essential hypertension 08/11/2014  . Hyperlipidemia 08/11/2014  . Peripheral neuropathy ([place]) 08/11/2014  . Non-small cell lung cancer ([place]) 08/11/2014  . Shoulder impingement 02/24/2015  . Hyperglycemia 04/14/2015  . Prediabetes 04/15/2015  . Dermatitis 10/18/2015  . Cyst of pharynx 01/12/2016  . Aortic atherosclerosis ([place]) 01/12/2016  . Elevated serum creatinine 02/10/2016   Resolved Ambulatory Problems    Diagnosis Date Noted  . Acute bronchitis 01/08/2015   Past Medical History:  Diagnosis Date  . Hypertension   . Lung cancer ([place])   . Thyroid disease      Review of Systems See HPI.     Objective:   Physical Exam  Constitutional: He is oriented to person, place, and time. He appears well-developed and well-nourished.  HENT:  Head: Normocephalic and atraumatic.  Cardiovascular: Normal rate, regular rhythm and normal heart sounds.   Pulmonary/Chest: Effort normal and breath sounds normal.  Musculoskeletal:  Right elbow:  Not tender to light touch.  Warm to palpation with visible area of erythema around the right elbow.  Tender to deep palpation along the lateral epicondyle.  Bursa enlarged and fluctuant.   Neurological: He is alert and oriented to person, place, and time.  Psychiatric: He has a normal mood and affect. His behavior is normal.          Assessment & Plan:   [name] KitchenEaster was seen today for elbow problem.  Diagnoses and all orders for this visit:  Olecranon bursitis, right elbow  Other orders -     amoxicillin-clavulanate (AUGMENTIN) 875-125 MG tablet; Take 1 tablet by mouth 2 (two) times daily.   Discussed with nurse to add uric acid. Not able to draw off fluid to send for testing. Pt was placed in compression dressing for the weekend. Ibuprofen for the next 3 days. It was warm to touch sent abx to start since not able to drain off fluid. May need u/s guided fluid drained if not improving.

## 2016-09-03 LAB — TSH: TSH: 6.96 mIU/L — ABNORMAL HIGH (ref 0.40–4.50)

## 2016-09-06 LAB — URIC ACID: URIC ACID, SERUM: 7.7 mg/dL (ref 4.0–8.0)

## 2016-09-06 NOTE — Telephone Encounter (Signed)
Ok to refill same dose for 6 months.

## 2016-09-06 NOTE — Progress Notes (Signed)
Call pt: find out how elbow doing? Uric acid in normal range. Not gout.

## 2016-09-09 ENCOUNTER — Ambulatory Visit: Payer: Medicare Other | Admitting: Physician Assistant

## 2016-09-09 ENCOUNTER — Other Ambulatory Visit: Payer: Self-pay | Admitting: *Deleted

## 2016-09-09 MED ORDER — LEVOTHYROXINE SODIUM 125 MCG PO TABS: ORAL_TABLET | ORAL | 5 refills | Status: DC

## 2016-09-22 ENCOUNTER — Other Ambulatory Visit: Payer: Self-pay | Admitting: *Deleted

## 2016-09-22 DIAGNOSIS — M79643 Pain in unspecified hand: Secondary | ICD-10-CM

## 2016-09-22 MED ORDER — HYDROCODONE-ACETAMINOPHEN 10-325 MG PO TABS: ORAL_TABLET | ORAL | 0 refills | Status: DC

## 2016-10-26 ENCOUNTER — Other Ambulatory Visit: Payer: Self-pay | Admitting: Family Medicine

## 2016-10-26 DIAGNOSIS — M79643 Pain in unspecified hand: Secondary | ICD-10-CM

## 2016-10-28 MED ORDER — HYDROCODONE-ACETAMINOPHEN 10-325 MG PO TABS: ORAL_TABLET | ORAL | 0 refills | Status: DC

## 2016-10-28 NOTE — Telephone Encounter (Signed)
Left VM for Pt advising Rx ready for pick up

## 2016-11-04 ENCOUNTER — Encounter: Payer: Self-pay | Admitting: Physician Assistant

## 2016-11-04 ENCOUNTER — Ambulatory Visit (INDEPENDENT_AMBULATORY_CARE_PROVIDER_SITE_OTHER): Payer: Medicare Other | Admitting: Physician Assistant

## 2016-11-04 VITALS — BP 153/84 | HR 74 | Temp 98.2°F | Wt 172.0 lb

## 2016-11-04 DIAGNOSIS — I10 Essential (primary) hypertension: Secondary | ICD-10-CM

## 2016-11-04 DIAGNOSIS — Z566 Other physical and mental strain related to work: Secondary | ICD-10-CM | POA: Insufficient documentation

## 2016-11-04 DIAGNOSIS — R5383 Other fatigue: Secondary | ICD-10-CM | POA: Insufficient documentation

## 2016-11-04 DIAGNOSIS — E039 Hypothyroidism, unspecified: Secondary | ICD-10-CM

## 2016-11-04 DIAGNOSIS — Z23 Encounter for immunization: Secondary | ICD-10-CM | POA: Diagnosis not present

## 2016-11-04 DIAGNOSIS — M79643 Pain in unspecified hand: Secondary | ICD-10-CM

## 2016-11-04 MED ORDER — VALSARTAN-HYDROCHLOROTHIAZIDE 160-25 MG PO TABS: 1.0000 | ORAL_TABLET | Freq: Every day | ORAL | 1 refills | Status: DC

## 2016-11-04 MED ORDER — MELOXICAM 15 MG PO TABS: 15.0000 mg | ORAL_TABLET | Freq: Every day | ORAL | 0 refills | Status: DC

## 2016-11-04 MED ORDER — HYDROCODONE-ACETAMINOPHEN 10-325 MG PO TABS: ORAL_TABLET | ORAL | 0 refills | Status: DC

## 2016-11-04 MED ORDER — PNEUMOCOCCAL VAC POLYVALENT 25 MCG/0.5ML IJ INJ
0.5000 mL | INJECTION | Freq: Once | INTRAMUSCULAR | Status: AC
  Administered 2016-11-04: 0.5 mL via INTRAMUSCULAR

## 2016-11-04 NOTE — Patient Instructions (Addendum)
Keep check on BP nurse visit in 2 weeks. Check at local pharmacy if above 140/90 then will increase medication call office first for documentation.   Follow up in 3 months.

## 2016-11-04 NOTE — Progress Notes (Signed)
Subjective:    Patient ID: Walter Wilkerson, male    DOB: 1950-09-06, 66 y.o.   MRN: [id number]  HPI Pt is a 66 year old male who presents to the clinic for 60-month follow-up and medication refills.  Chronic pain syndrome of bilateral hands.  He has been on Norco every 6-8 hours for multiple years.  This does fairly well but he continues to have exacerbations of pain.  He occasionally will take an anti-inflammatory over-the-counter.  This does seem to help some.  He is also on gabapentin for pain.  Hypertension-blood pressure has previously been controlled.  His last reading was 125/83.  He just took his blood pressure medicine about 30 minutes ago.  He denies any chest pain, shortness of breath, palpitations or headache.  He does not check his blood pressure at home.  He is somewhat concerned with fatigue.  He feels like it started about 2 weeks ago.  He wakes up feeling rested and it hits him in the afternoon about 1:59 PM.  He has no problems going to sleep and getting a good night's rest.  He does admit to snoring.  Patient exercises regularly.  He denies any fever, chills, nausea vomiting.  He does admit to having a lot of stress at work recently.  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  Patient is not on any medications for his mood.  Patient does take B12 over-the-counter daily.  .. Active Ambulatory Problems    Diagnosis Date Noted  . Acquired hypothyroidism 08/11/2014  . Essential hypertension 08/11/2014  . Hyperlipidemia 08/11/2014  . Peripheral neuropathy ([place]) 08/11/2014  . Non-small cell lung cancer ([place]) 08/11/2014  . Shoulder impingement 02/24/2015  . Hyperglycemia 04/14/2015  . Prediabetes 04/15/2015  . Dermatitis 10/18/2015  . Cyst of pharynx 01/12/2016  . Aortic atherosclerosis ([place]) 01/12/2016  . Elevated serum creatinine 02/10/2016  . Fatigue 11/04/2016  . Stress at work 11/04/2016   Resolved Ambulatory Problems     Diagnosis Date Noted  . Acute bronchitis 01/08/2015   Past Medical History:  Diagnosis Date  . Hypertension   . Lung cancer ([place])   . Thyroid disease        Review of Systems  All other systems reviewed and are negative.      Objective:   Physical Exam  Constitutional: He is oriented to person, place, and time. He appears well-developed and well-nourished.  HENT:  Head: Normocephalic and atraumatic.  Neck: Normal range of motion. Neck supple. No thyromegaly present.  Cardiovascular: Normal rate, regular rhythm and normal heart sounds.   Pulmonary/Chest: Effort normal and breath sounds normal.  Breath sounds absent over RLF.   Neurological: He is alert and oriented to person, place, and time.  Psychiatric: He has a normal mood and affect. His behavior is normal.          Assessment & Plan:  [name] KitchenFreddie was seen today for follow-up.  Diagnoses and all orders for this visit:  Essential hypertension -     valsartan-hydrochlorothiazide (DIOVAN-HCT) 160-25 MG tablet; Take 1 tablet by mouth daily. -     COMPLETE METABOLIC PANEL WITH GFR  Need for influenza vaccination -     Cancel: Flu Vaccine QUAD 6+ mos PF IM (Fluarix Quad PF) -     Flu Vaccine QUAD 6+ mos PF IM (Fluarix Quad PF)  Acquired hypothyroidism -     TSH  No energy -     VITAMIN D 25 Hydroxy (  Vit-D Deficiency, Fractures) -     B12 -     COMPLETE METABOLIC PANEL WITH GFR -     CBC with Differential/Platelet  Pain of hand, unspecified laterality -     meloxicam (MOBIC) 15 MG tablet; Take 1 tablet (15 mg total) by mouth daily. -     HYDROcodone-acetaminophen (NORCO) 10-325 MG tablet; One by mouth every 6-8 hours as needed for pain. -     Drug Abuse Panel 10-50 No Conf, U  Fatigue, unspecified type  Stress at work  Need for prophylactic vaccination against Streptococcus pneumoniae (pneumococcus) -     pneumococcal 23 valent vaccine (PNU-IMMUNE) injection 0.5 mL; Inject 0.5 mLs into the muscle  once.    .. Depression screen [facility] 2/9 11/04/2016 09/02/2016 05/27/2016 04/13/2015  Decreased Interest 0 0 0 0  Down, Depressed, Hopeless 0 0 0 0  PHQ - 2 Score 0 0 0 0   Blood pressure is elevated today.  On second recheck at the end of visit it did decrease some.  Patient is asymptomatic.  And his last blood pressure readings have been great.  Patient will start to monitor his blood pressure at local pharmacies.  He is aware if trending above 140/90 to call the office.  At that time I would likely increase his medication for a blood pressure recheck I would like to recheck his blood pressure here in office in 2 weeks.  Discussed causes of blood pressure elevation.  He is in a lot of pain today.  This could be the cause.  Chronic pain syndrome-patient has been controlled on Norco even before I started seeing him as a patient. Pain contract was updated today. [place] controlled substance database was reviewed with no concerns today. UDS was obtained today for evaluation. Patient did not need a refill today but I did give him a postdated prescription for his November refill since he was already in the office. I did discuss trying to tumeric500 mg twice a day for inflammation and pain. I did also give him a prescription for meloxicam to take only as needed for days where pain is flared up.  I do want him to be careful with this medication.  As he has had some creatinine elevation in the past.  He will only use this medication for no more than 3 days at a time only as needed.  Discussed fatigue and the most common causes.  His thyroid has not been regulated recently.  We will recheck this today.  Also will check labs for B12, vitamin D, CBC.  I did discuss with her stress, anxiety, depression can cause fatigue.  Discussed stress and relaxation techniques.  He is under a lot of stress at work right now.  He is getting good rest which is great.  The likelihood of sleep apnea is low due to to his  great BMI and she wakes up feeling very rested.  We could consider testing for this in the future if fatigue does not resolve.  I do not see any evidence of acute illness; however, he could be fighting off a viral infection with the weather change.  Follow-up if fatigue is not resolving in the next 2-4 weeks.  Continue to exercise regularly.  [name] KitchenSpent 30 minutes with patient and greater than 50 percent of visit spent counseling patient regarding treatment plan.

## 2016-11-05 LAB — DRUG ABUSE PANEL 10-50 NO CONF, U
AMPHETAMINES (1000 ng/mL SCRN): NEGATIVE
BARBITURATES: NEGATIVE
BENZODIAZEPINES: NEGATIVE
COCAINE METABOLITES: NEGATIVE
MARIJUANA MET (50 ng/mL SCRN): NEGATIVE
METHADONE: NEGATIVE
METHAQUALONE: NEGATIVE
OPIATES: POSITIVE — AB
PHENCYCLIDINE: NEGATIVE
PROPOXYPHENE: NEGATIVE

## 2016-11-05 LAB — CBC WITH DIFFERENTIAL/PLATELET
Basophils Absolute: 72 {cells}/uL (ref 0–200)
Basophils Relative: 1.9 %
Eosinophils Absolute: 171 {cells}/uL (ref 15–500)
Eosinophils Relative: 4.5 %
HCT: 36.6 % — ABNORMAL LOW (ref 38.5–50.0)
Hemoglobin: 12.4 g/dL — ABNORMAL LOW (ref 13.2–17.1)
Lymphs Abs: 1102 {cells}/uL (ref 850–3900)
MCH: 31.6 pg (ref 27.0–33.0)
MCHC: 33.9 g/dL (ref 32.0–36.0)
MCV: 93.1 fL (ref 80.0–100.0)
MPV: 11.5 fL (ref 7.5–12.5)
Monocytes Relative: 9 %
Neutro Abs: 2113 {cells}/uL (ref 1500–7800)
Neutrophils Relative %: 55.6 %
Platelets: 182 Thousand/uL (ref 140–400)
RBC: 3.93 Million/uL — ABNORMAL LOW (ref 4.20–5.80)
RDW: 12.5 % (ref 11.0–15.0)
Total Lymphocyte: 29 %
WBC mixed population: 342 {cells}/uL (ref 200–950)
WBC: 3.8 Thousand/uL (ref 3.8–10.8)

## 2016-11-05 LAB — COMPLETE METABOLIC PANEL WITH GFR
AG Ratio: 1.3 (calc) (ref 1.0–2.5)
ALBUMIN MSPROF: 4 g/dL (ref 3.6–5.1)
ALT: 13 U/L (ref 9–46)
AST: 18 U/L (ref 10–35)
Alkaline phosphatase (APISO): 41 U/L (ref 40–115)
BUN: 13 mg/dL (ref 7–25)
CO2: 28 mmol/L (ref 20–32)
CREATININE: 1.06 mg/dL (ref 0.70–1.25)
Calcium: 9.2 mg/dL (ref 8.6–10.3)
Chloride: 103 mmol/L (ref 98–110)
GFR, EST AFRICAN AMERICAN: 84 mL/min/{1.73_m2} (ref >=60)
GFR, Est Non African American: 72 mL/min/{1.73_m2} (ref >=60)
GLUCOSE: 83 mg/dL (ref 65–99)
Globulin: 3.1 g/dL (calc) (ref 1.9–3.7)
Potassium: 4.3 mmol/L (ref 3.5–5.3)
Sodium: 140 mmol/L (ref 135–146)
TOTAL PROTEIN: 7.1 g/dL (ref 6.1–8.1)
Total Bilirubin: 0.6 mg/dL (ref 0.2–1.2)

## 2016-11-05 LAB — VITAMIN B12: Vitamin B-12: 736 pg/mL (ref 200–1100)

## 2016-11-05 LAB — VITAMIN D 25 HYDROXY (VIT D DEFICIENCY, FRACTURES): Vit D, 25-Hydroxy: 44 ng/mL (ref 30–100)

## 2016-11-05 LAB — TSH: TSH: 8.73 m[IU]/L — AB (ref 0.40–4.50)

## 2016-11-07 MED ORDER — LEVOTHYROXINE SODIUM 137 MCG PO TABS: 137.0000 ug | ORAL_TABLET | Freq: Every day | ORAL | 1 refills | Status: DC

## 2016-11-07 NOTE — Addendum Note (Signed)
Addended by: [name] on: 11/07/2016 08:15 AM   Modules accepted: Orders

## 2016-11-21 ENCOUNTER — Ambulatory Visit: Payer: Medicare Other | Admitting: Physician Assistant

## 2016-11-21 ENCOUNTER — Ambulatory Visit (INDEPENDENT_AMBULATORY_CARE_PROVIDER_SITE_OTHER): Payer: Medicare Other | Admitting: Family Medicine

## 2016-11-21 VITALS — BP 168/78 | Ht 68.5 in | Wt 175.0 lb

## 2016-11-21 DIAGNOSIS — M7501 Adhesive capsulitis of right shoulder: Secondary | ICD-10-CM

## 2016-11-21 NOTE — Patient Instructions (Addendum)
Thank you for coming in today. Attend PT.  Recheck in 1 month.  Return sooner if needed.    Adhesive Capsulitis Adhesive capsulitis is inflammation of the tendons and ligaments that surround the shoulder joint (shoulder capsule). This condition causes the shoulder to become stiff and painful to move. Adhesive capsulitis is also called frozen shoulder. What are the causes? This condition may be caused by:  An injury to the shoulder joint.  Straining the shoulder.  Not moving the shoulder for a period of time. This can happen if your arm was injured or in a sling.  Long-standing health problems, such as: ? Diabetes. ? Thyroid problems. ? Heart disease. ? Stroke. ? Rheumatoid arthritis. ? Lung disease.  In some cases, the cause may not be known. What increases the risk? This condition is more likely to develop in:  Women.  People who are older than 67 years of age.  What are the signs or symptoms? Symptoms of this condition include:  Pain in the shoulder when moving the arm. There may also be pain when parts of the shoulder are touched. The pain is worse at night or when at rest.  Soreness or aching in the shoulder.  Inability to move the shoulder normally.  Muscle spasms.  How is this diagnosed? This condition is diagnosed with a physical exam and imaging tests, such as an X-ray or MRI. How is this treated? This condition may be treated with:  Treatment of the underlying cause or condition.  Physical therapy. This involves performing exercises to get the shoulder moving again.  Medicine. Medicine may be given to relieve pain, inflammation, or muscle spasms.  Steroid injections into the shoulder joint.  Shoulder manipulation. This is a procedure to move the shoulder into another position. It is done after you are given a medicine to make you fall asleep (general anesthetic). The joint may also be injected with salt water at high pressure to break down  scarring.  Surgery. This may be done in severe cases when other treatments have failed.  Although most people recover completely from adhesive capsulitis, some may not regain the full movement of the shoulder. Follow these instructions at home:  Take over-the-counter and prescription medicines only as told by your health care provider.  If you are being treated with physical therapy, follow instructions from your physical therapist.  Avoid exercises that put a lot of demand on your shoulder, such as throwing. These exercises can make pain worse.  If directed, apply ice to the injured area: ? Put ice in a plastic bag. ? Place a towel between your skin and the bag. ? Leave the ice on for 20 minutes, 2-3 times per day. Contact a health care provider if:  You develop new symptoms.  Your symptoms get worse. This information is not intended to replace advice given to you by your health care provider. Make sure you discuss any questions you have with your health care provider. Document Released: 10/24/2008 Document Revised: 06/04/2015 Document Reviewed: 04/21/2014 Elsevier Interactive Patient Education  [employer].

## 2016-11-21 NOTE — Progress Notes (Signed)
Walter Wilkerson is a 65 y.o. male with a history of HTN, lung cancer, and thyroid disease who presents to [facility] today for right shoulder pain of approximately 2 weeks duration.  Patient first developed right shoulder pain 2 weeks ago after receiving the pneumococcal and flu vaccines (though these were injected in left arm). After that point, patient became sore in deltoid region, but over the course of the past week his pain has spread through his right trapezius and down to his right scapula. At this point, his pain is severely painful from his shoulder to his scapula. In addition, patient has developed severely limited range of motion, unable to lift his right arm about 90 degrees. ROM is limited both passively and actively. Patient states that pain is severe, especially with motion. Motion in all 3 directions reproduces the pain. Nothing makes the pain better, and it has made sleep and work very difficult. Placing his arm behind his back can temporarily result in some relief.   Patient does have history of lung cancer and right lobectomy in 2008. Patient followed closely and has not shown signs of recurrence. Patient had no post surgical muscular or nerve deficits that could explain current symptoms. Patient without significant pulmonary or neurologic deficit. He does not have cough or pupillary constriction.   Patient takes norco and gabapentin for post-surgical pain related to his lung cancer. This only marginally helps pain. He has not tried ibuprofen or tylenol for the pain.  Past Medical History:  Diagnosis Date  . Hypertension   . Lung cancer ([place])   . Thyroid disease    Past Surgical History:  Procedure Laterality Date  . LUNG LOBECTOMY Right 2008   RLL resection  . VASECTOMY     Social History   Tobacco Use  . Smoking status: Former Smoker    Last attempt to quit: 08/10/2004    Years since quitting: 12.2  . Smokeless tobacco:  Never Used  Substance Use Topics  . Alcohol use: Yes    Alcohol/week: 3.0 oz    Types: 5 Standard drinks or equivalent per week   ROS:  As above  Medications: Current Outpatient Medications  Medication Sig Dispense Refill  . atorvastatin (LIPITOR) 40 MG tablet 20 mg.    . gabapentin (NEURONTIN) 100 MG capsule Take 1 capsule (100 mg total) by mouth 3 (three) times daily. 270 capsule 1  . HYDROcodone-acetaminophen (NORCO) 10-325 MG tablet One by mouth every 6-8 hours as needed for pain. 120 tablet 0  . levothyroxine (SYNTHROID, LEVOTHROID) 137 MCG tablet Take 1 tablet (137 mcg total) by mouth daily before breakfast. 30 tablet 1  . meloxicam (MOBIC) 15 MG tablet Take 1 tablet (15 mg total) by mouth daily. 30 tablet 0  . Multiple Vitamin (MULTIVITAMIN) tablet Take 1 tablet by mouth daily.    . valsartan-hydrochlorothiazide (DIOVAN-HCT) 160-25 MG tablet Take 1 tablet by mouth daily. 90 tablet 1   No current facility-administered medications for this visit.    No Known Allergies   Exam:  BP (!) 168/78   Ht 5' 8.5" (1.74 m)   Wt 175 lb (79.4 kg)   BMI 26.22 kg/m  General: Well Developed, well nourished, and in no acute distress.  Neuro/Psych: Alert and oriented x3, extra-ocular muscles intact, able to move all 4 extremities, sensation grossly intact. Skin: Warm and dry, no rashes noted.  Respiratory: Not using accessory muscles, speaking in full sentences, trachea midline.  Cardiovascular: Pulses palpable, no  extremity edema. Abdomen: Does not appear distended. MSK:  R shoulder: Patient with severe right shoulder discomfort. Not increasingly tender to palpation along shoulder capsule, deltoid, or scapula.  Shoulder grossly normal appearing without overly erythema.  Sensation grossly intact in right upper extremity. ROM significantly impaired in all directions both actively and passively.  Abduction: Active and passive 90 External rotation limited active and passive to 30 beyond  neutral position. Internal rotation limited to the lumbar spine.  Strength 5/5 in bilateral upper extremities though significantly limited due to pain. Testing limited due to pain and ROM. Hawkins and Neers unable to perform due to limitation in ROM. Empty can positive. Crossover arm compression positive. Pain significant with internal/external rotation.   Procedure: Real-time Ultrasound Guided Injection of right GH joint  Device: GE Logiq E  Images permanently stored and available for review in the ultrasound unit. Verbal informed consent obtained. Discussed risks and benefits of procedure. Warned about infection bleeding damage to structures skin hypopigmentation and fat atrophy among others. Patient expresses understanding and agreement Time-out conducted.  Noted no overlying erythema, induration, or other signs of local infection.  Skin prepped in a sterile fashion.  Local anesthesia: Topical Ethyl chloride.  With sterile technique and under real time ultrasound guidance: 80mg  kenalog and 56ml marcaine injected easily.  Completed without difficulty  Pain did not resolved following injection.  Advised to call if fevers/chills, erythema, induration, drainage, or persistent bleeding.  Images permanently stored and available for review in the ultrasound unit.  Impression: Technically successful ultrasound guided injection.    No results found for this or any previous visit (from the past 48 hour(s)). No results found.    Assessment and Plan: 66 y.o. male with 2 weeks history worsening shoulder pain with severely limited active and passive ROM. Patient's presentation most concerning for adhesive capsulitis. Patient's history of vaccination 2 weeks ago may have cause inflammatory response, though shot was in opposite arm. Regardless, patient having significant pain and limited range of motion.   Performed US guided joint injection into right shoulder joint. Patient was informed  of risks and benefits of procedure and elected to progress. Patient tolerated procedure well. Needle and injection visualized entered glenohumeral joint space. Patient's pain did not immediately improve, which may argue towards an alternative diagnosis, though adhesive capsulitis is still most likely.   Otherwise, patient should attend physical therapy targeting improvement in range of motion. Patient should start to see some pain relief in next several days and improved ROM with physical therapy.  Patient should return to follow-up if symptoms do not improve with physical therapy/joint injection, or if symptoms acutely worsen.   Orders Placed This Encounter  Procedures  . Ambulatory referral to Physical Therapy    Referral Priority:   Routine    Referral Type:   Physical Medicine    Referral Reason:   Specialty Services Required    Requested Specialty:   Physical Therapy   No orders of the defined types were placed in this encounter.  Discussed warning signs or symptoms. Please see discharge instructions. Patient expresses understanding.

## 2016-12-06 ENCOUNTER — Other Ambulatory Visit: Payer: Self-pay | Admitting: Physician Assistant

## 2016-12-06 DIAGNOSIS — M79643 Pain in unspecified hand: Secondary | ICD-10-CM

## 2016-12-11 ENCOUNTER — Other Ambulatory Visit: Payer: Self-pay | Admitting: Physician Assistant

## 2016-12-11 DIAGNOSIS — G629 Polyneuropathy, unspecified: Secondary | ICD-10-CM

## 2016-12-13 ENCOUNTER — Encounter: Payer: Self-pay | Admitting: Family Medicine

## 2016-12-13 ENCOUNTER — Ambulatory Visit (INDEPENDENT_AMBULATORY_CARE_PROVIDER_SITE_OTHER): Payer: Medicare Other | Admitting: Family Medicine

## 2016-12-13 ENCOUNTER — Ambulatory Visit (INDEPENDENT_AMBULATORY_CARE_PROVIDER_SITE_OTHER): Payer: Medicare Other

## 2016-12-13 VITALS — BP 152/87 | HR 75 | Wt 173.0 lb

## 2016-12-13 DIAGNOSIS — M7501 Adhesive capsulitis of right shoulder: Secondary | ICD-10-CM | POA: Diagnosis not present

## 2016-12-13 DIAGNOSIS — M25511 Pain in right shoulder: Secondary | ICD-10-CM | POA: Diagnosis not present

## 2016-12-13 DIAGNOSIS — J0100 Acute maxillary sinusitis, unspecified: Secondary | ICD-10-CM | POA: Diagnosis not present

## 2016-12-13 MED ORDER — AZITHROMYCIN 250 MG PO TABS: 250.0000 mg | ORAL_TABLET | Freq: Every day | ORAL | 0 refills | Status: DC

## 2016-12-13 MED ORDER — DICLOFENAC SODIUM 1 % TD GEL: 4.0000 g | Freq: Four times a day (QID) | TRANSDERMAL | 11 refills | Status: DC

## 2016-12-13 MED ORDER — PREDNISONE 10 MG PO TABS: 30.0000 mg | ORAL_TABLET | Freq: Every day | ORAL | 0 refills | Status: DC

## 2016-12-13 NOTE — Patient Instructions (Signed)
Thank you for coming in today. Apply voltaren gel to the right shoulder Take prednisone and azithromycin.  You should hear about shoulder MRI soon.  Get xray today.  Follow up with me at least 24 hours after the MRI.    Adhesive Capsulitis Adhesive capsulitis is inflammation of the tendons and ligaments that surround the shoulder joint (shoulder capsule). This condition causes the shoulder to become stiff and painful to move. Adhesive capsulitis is also called frozen shoulder. What are the causes? This condition may be caused by:  An injury to the shoulder joint.  Straining the shoulder.  Not moving the shoulder for a period of time. This can happen if your arm was injured or in a sling.  Long-standing health problems, such as: ? Diabetes. ? Thyroid problems. ? Heart disease. ? Stroke. ? Rheumatoid arthritis. ? Lung disease.  In some cases, the cause may not be known. What increases the risk? This condition is more likely to develop in:  Women.  People who are older than 67 years of age.  What are the signs or symptoms? Symptoms of this condition include:  Pain in the shoulder when moving the arm. There may also be pain when parts of the shoulder are touched. The pain is worse at night or when at rest.  Soreness or aching in the shoulder.  Inability to move the shoulder normally.  Muscle spasms.  How is this diagnosed? This condition is diagnosed with a physical exam and imaging tests, such as an X-ray or MRI. How is this treated? This condition may be treated with:  Treatment of the underlying cause or condition.  Physical therapy. This involves performing exercises to get the shoulder moving again.  Medicine. Medicine may be given to relieve pain, inflammation, or muscle spasms.  Steroid injections into the shoulder joint.  Shoulder manipulation. This is a procedure to move the shoulder into another position. It is done after you are given a medicine to  make you fall asleep (general anesthetic). The joint may also be injected with salt water at high pressure to break down scarring.  Surgery. This may be done in severe cases when other treatments have failed.  Although most people recover completely from adhesive capsulitis, some may not regain the full movement of the shoulder. Follow these instructions at home:  Take over-the-counter and prescription medicines only as told by your health care provider.  If you are being treated with physical therapy, follow instructions from your physical therapist.  Avoid exercises that put a lot of demand on your shoulder, such as throwing. These exercises can make pain worse.  If directed, apply ice to the injured area: ? Put ice in a plastic bag. ? Place a towel between your skin and the bag. ? Leave the ice on for 20 minutes, 2-3 times per day. Contact a health care provider if:  You develop new symptoms.  Your symptoms get worse. This information is not intended to replace advice given to you by your health care provider. Make sure you discuss any questions you have with your health care provider. Document Released: 10/24/2008 Document Revised: 06/04/2015 Document Reviewed: 04/21/2014 Elsevier Interactive Patient Education  [employer].

## 2016-12-14 ENCOUNTER — Telehealth: Payer: Self-pay | Admitting: *Deleted

## 2016-12-14 DIAGNOSIS — E039 Hypothyroidism, unspecified: Secondary | ICD-10-CM

## 2016-12-14 NOTE — Telephone Encounter (Signed)
Labs ordered to recheck TSH.

## 2016-12-14 NOTE — Progress Notes (Signed)
Walter Wilkerson is a 66 y.o. male who presents to [facility] today for right shoulder pain.   Azaryah continues to have pain in the right shoulder associated with stiffness. He was seen for this issues on 11/21/16 and had a glenohumeral injection which did not help much. He notes that the pain is worsening and not interfering with sleep and work. He has tried over the counter medicines which have not helped much.   He also notes cough and congestion associated with runny nose. This has been present for a week or 2. In the past he's done well with steroids and antibiotics for similar symptoms. He denies any vomiting or diarrhea. He denies shortness of breath.   Past Medical History:  Diagnosis Date  . Hypertension   . Lung cancer ([place])   . Thyroid disease    Past Surgical History:  Procedure Laterality Date  . LUNG LOBECTOMY Right 2008   RLL resection  . VASECTOMY     Social History   Tobacco Use  . Smoking status: Former Smoker    Last attempt to quit: 08/10/2004    Years since quitting: 12.3  . Smokeless tobacco: Never Used  Substance Use Topics  . Alcohol use: Yes    Alcohol/week: 3.0 oz    Types: 5 Standard drinks or equivalent per week     ROS:  As above   Medications: Current Outpatient Medications  Medication Sig Dispense Refill  . atorvastatin (LIPITOR) 40 MG tablet 20 mg.    . gabapentin (NEURONTIN) 100 MG capsule TAKE ONE CAPSULE BY MOUTH 3 TIMES A DAY 270 capsule 1  . HYDROcodone-acetaminophen (NORCO) 10-325 MG tablet One by mouth every 6-8 hours as needed for pain. 120 tablet 0  . levothyroxine (SYNTHROID, LEVOTHROID) 137 MCG tablet Take 1 tablet (137 mcg total) by mouth daily before breakfast. 30 tablet 1  . meloxicam (MOBIC) 15 MG tablet TAKE ONE TABLET BY MOUTH EVERY DAY 30 tablet 1  . Multiple Vitamin (MULTIVITAMIN) tablet Take 1 tablet by mouth daily.    . valsartan-hydrochlorothiazide (DIOVAN-HCT) 160-25 MG  tablet Take 1 tablet by mouth daily. 90 tablet 1  . azithromycin (ZITHROMAX) 250 MG tablet Take 1 tablet (250 mg total) by mouth daily. Take first 2 tablets together, then 1 every day until finished. 6 tablet 0  . diclofenac sodium (VOLTAREN) 1 % GEL Apply 4 g topically 4 (four) times daily. To affected joint. 100 g 11  . predniSONE (DELTASONE) 10 MG tablet Take 3 tablets (30 mg total) by mouth daily with breakfast. 15 tablet 0   No current facility-administered medications for this visit.    No Known Allergies   Exam:  BP (!) 152/87   Pulse 75   Wt 173 lb (78.5 kg)   BMI 25.92 kg/m  General: Well Developed, well nourished, and in no acute distress.  HEENT: Clear nasal discharge, posterior pharynx with cobblestoning, normal TM BL.  Neuro/Psych: Alert and oriented x3, extra-ocular muscles intact, able to move all 4 extremities, sensation grossly intact. Skin: Warm and dry, no rashes noted.  Respiratory: Not using accessory muscles, speaking in full sentences, trachea midline.  Clear to auscultation with no wheezing Cardiovascular: Pulses palpable, no extremity edema. Abdomen: Does not appear distended. MSK: Right Shoulder significantly limited external rotation and abduction.     No results found for this or any previous visit (from the past 48 hour(s)). Dg Shoulder Right  Result Date: 12/13/2016 CLINICAL DATA:  66 year old male  with right shoulder pain for 5 weeks. No injury. Initial encounter. EXAM: RIGHT SHOULDER - 2+ VIEW COMPARISON:  01/12/2016 chest x-ray. FINDINGS: Minimal acromioclavicular joint degenerative changes. No fracture or dislocation. No abnormal soft tissue calcifications. Stable right apical pleural thickening without associated bony destruction. IMPRESSION: Minimal acromioclavicular joint degenerative changes. Electronically Signed   By: Genia Del M.D.   On: 12/13/2016 19:58      Assessment and Plan: 66 y.o. male with shoulder pain with significant limited  range of motion associated with a relatively normal shoulder x-ray is concerning for adhesive capsulitis. Patient really did not benefit from injection which is not typical. Plan for MRI in the near future to further evaluate the cause of the pain and for potential surgical planning.  We'll treat pain in the interim with continued home Meloxican and Norco. We'll add Voltaren gel for pain control.  Oral prednisone for sinusitis below should also help.   Additionally patient appears to have sinusitis. Plan to treat with azithromycin and prednisone.  Follow up with PCP PRN.     Orders Placed This Encounter  Procedures  . MR Shoulder Right Wo Contrast    Standing Status:   Future    Standing Expiration Date:   02/13/2018    Order Specific Question:   What is the patient's sedation requirement?    Answer:   No Sedation    Order Specific Question:   Does the patient have a pacemaker or implanted devices?    Answer:   No    Order Specific Question:   Preferred imaging location?    Answer:   [facility] (table limit-350lbs)    Order Specific Question:   Radiology Contrast Protocol - do NOT remove file path    Answer:   [email or web address]  . DG Shoulder Right    Standing Status:   Future    Number of Occurrences:   1    Standing Expiration Date:   02/13/2018    Order Specific Question:   Reason for Exam (SYMPTOM  OR DIAGNOSIS REQUIRED)    Answer:   eval pain right shoulder suspect frozen shoulder MRI pending    Order Specific Question:   Preferred imaging location?    Answer:   [facility]    Order Specific Question:   Radiology Contrast Protocol - do NOT remove file path    Answer:   [email or web address]   Meds ordered this encounter  Medications  . azithromycin (ZITHROMAX) 250 MG tablet    Sig: Take 1 tablet (250 mg total) by mouth daily. Take first 2 tablets together, then 1 every day until  finished.    Dispense:  6 tablet    Refill:  0  . predniSONE (DELTASONE) 10 MG tablet    Sig: Take 3 tablets (30 mg total) by mouth daily with breakfast.    Dispense:  15 tablet    Refill:  0  . diclofenac sodium (VOLTAREN) 1 % GEL    Sig: Apply 4 g topically 4 (four) times daily. To affected joint.    Dispense:  100 g    Refill:  11    Discussed warning signs or symptoms. Please see discharge instructions. Patient expresses understanding.

## 2016-12-16 LAB — TSH: TSH: 5.43 mIU/L — ABNORMAL HIGH (ref 0.40–4.50)

## 2016-12-20 ENCOUNTER — Other Ambulatory Visit: Payer: Self-pay | Admitting: Physician Assistant

## 2016-12-20 MED ORDER — LEVOTHYROXINE SODIUM 150 MCG PO TABS: 150.0000 ug | ORAL_TABLET | Freq: Every day | ORAL | 1 refills | Status: DC

## 2016-12-23 ENCOUNTER — Ambulatory Visit: Payer: Medicare Other | Admitting: Family Medicine

## 2016-12-26 ENCOUNTER — Other Ambulatory Visit: Payer: Self-pay | Admitting: Physician Assistant

## 2016-12-26 ENCOUNTER — Ambulatory Visit (INDEPENDENT_AMBULATORY_CARE_PROVIDER_SITE_OTHER): Payer: Medicare Other

## 2016-12-26 DIAGNOSIS — M7521 Bicipital tendinitis, right shoulder: Secondary | ICD-10-CM | POA: Diagnosis not present

## 2016-12-26 DIAGNOSIS — M7501 Adhesive capsulitis of right shoulder: Secondary | ICD-10-CM

## 2016-12-27 ENCOUNTER — Ambulatory Visit (INDEPENDENT_AMBULATORY_CARE_PROVIDER_SITE_OTHER): Payer: Medicare Other | Admitting: Family Medicine

## 2016-12-27 ENCOUNTER — Encounter: Payer: Self-pay | Admitting: Family Medicine

## 2016-12-27 ENCOUNTER — Other Ambulatory Visit: Payer: Self-pay

## 2016-12-27 DIAGNOSIS — M79643 Pain in unspecified hand: Secondary | ICD-10-CM

## 2016-12-27 DIAGNOSIS — M7500 Adhesive capsulitis of unspecified shoulder: Secondary | ICD-10-CM | POA: Insufficient documentation

## 2016-12-27 DIAGNOSIS — M7501 Adhesive capsulitis of right shoulder: Secondary | ICD-10-CM

## 2016-12-27 MED ORDER — HYDROCODONE-ACETAMINOPHEN 10-325 MG PO TABS: ORAL_TABLET | ORAL | 0 refills | Status: DC

## 2016-12-27 NOTE — Telephone Encounter (Signed)
[place] controlled substance database reviewed with no concerns. Refill sent.

## 2016-12-27 NOTE — Progress Notes (Signed)
Walter Wilkerson is a 66 y.o. male who presents to [facility] today for right shoulder pain.  [facility] had a recent right shoulder MRI showing infraspinatus tendinitis and small incomplete tear as well as evidence of adhesive capsulitis.  He continues to experience pain and stiffness.   Past Medical History:  Diagnosis Date  . Hypertension   . Lung cancer ([place])   . Thyroid disease    Past Surgical History:  Procedure Laterality Date  . LUNG LOBECTOMY Right 2008   RLL resection  . VASECTOMY     Social History   Tobacco Use  . Smoking status: Former Smoker    Last attempt to quit: 08/10/2004    Years since quitting: 12.3  . Smokeless tobacco: Never Used  Substance Use Topics  . Alcohol use: Yes    Alcohol/week: 3.0 oz    Types: 5 Standard drinks or equivalent per week     ROS:  As above   Medications: Current Outpatient Medications  Medication Sig Dispense Refill  . atorvastatin (LIPITOR) 40 MG tablet 20 mg.    . azithromycin (ZITHROMAX) 250 MG tablet Take 1 tablet (250 mg total) by mouth daily. Take first 2 tablets together, then 1 every day until finished. 6 tablet 0  . diclofenac sodium (VOLTAREN) 1 % GEL Apply 4 g topically 4 (four) times daily. To affected joint. 100 g 11  . gabapentin (NEURONTIN) 100 MG capsule TAKE ONE CAPSULE BY MOUTH 3 TIMES A DAY 270 capsule 1  . levothyroxine (SYNTHROID, LEVOTHROID) 150 MCG tablet Take 1 tablet (150 mcg total) by mouth daily before breakfast. 30 tablet 1  . meloxicam (MOBIC) 15 MG tablet TAKE ONE TABLET BY MOUTH EVERY DAY 30 tablet 1  . Multiple Vitamin (MULTIVITAMIN) tablet Take 1 tablet by mouth daily.    . predniSONE (DELTASONE) 10 MG tablet Take 3 tablets (30 mg total) by mouth daily with breakfast. 15 tablet 0  . valsartan-hydrochlorothiazide (DIOVAN-HCT) 160-25 MG tablet Take 1 tablet by mouth daily. 90 tablet 1  . HYDROcodone-acetaminophen (NORCO) 10-325 MG tablet One by mouth  every 6-8 hours as needed for pain. 120 tablet 0   No current facility-administered medications for this visit.    No Known Allergies   Exam:  BP (!) 186/73   Pulse 71   Ht 5' 8.5" (1.74 m)   Wt 174 lb (78.9 kg)   BMI 26.07 kg/m  General: Well Developed, well nourished, and in no acute distress.  Neuro/Psych: Alert and oriented x3, extra-ocular muscles intact, able to move all 4 extremities, sensation grossly intact. Skin: Warm and dry, no rashes noted.  Respiratory: Not using accessory muscles, speaking in full sentences, trachea midline.  Cardiovascular: Pulses palpable, no extremity edema. Abdomen: Does not appear distended. MSK: Right shoulder limited external rotation and abduction range of motion limited by pain.  Procedure: Real-time Ultrasound Guided Injection of right glenohumeral joint Device: GE Logiq E  Images permanently stored and available for review in the ultrasound unit. Verbal informed consent obtained. Discussed risks and benefits of procedure. Warned about infection bleeding damage to structures skin hypopigmentation and fat atrophy among others. Patient expresses understanding and agreement Time-out conducted.  Noted no overlying erythema, induration, or other signs of local infection.  Skin prepped in a sterile fashion.  Local anesthesia: Topical Ethyl chloride.  With sterile technique and under real time ultrasound guidance:80 mg of Depo-Medrol and 9 mL of Marcaine injected easily.  Completed without difficulty  Pain immediately  resolved suggesting accurate placement of the medication.  Advised to call if fevers/chills, erythema, induration, drainage, or persistent bleeding.  Images permanently stored and available for review in the ultrasound unit.  Impression: Technically successful ultrasound guided injection.      No results found for this or any previous visit (from the past 48 hour(s)). Mr Shoulder Right Wo Contrast  Result Date:  12/26/2016 CLINICAL DATA:  Decreased range of motion.  Pain for 2 months. EXAM: MRI OF THE RIGHT SHOULDER WITHOUT CONTRAST TECHNIQUE: Multiplanar, multisequence MR imaging of the shoulder was performed. No intravenous contrast was administered. COMPARISON:  None. FINDINGS: Rotator cuff: Supraspinatus tendon is intact. Mild tendinosis of the infraspinatus tendon with a tiny insertional interstitial tear and subcortical reactive marrow changes Teres minor tendon is intact. Subscapularis tendon is intact. Muscles: No atrophy or fatty replacement of nor abnormal signal within, the muscles of the rotator cuff. Biceps long head: Mild tendinosis of the intra-articular portion of the long head of the biceps tendon. Acromioclavicular Joint: Moderate arthropathy of the acromioclavicular joint. Type I acromion. No subacromial/subdeltoid bursal fluid. Glenohumeral Joint: No joint effusion. Partial-thickness cartilage loss of the superior glenoid with subchondral reactive marrow changes. Thickening of the inferior joint capsule as can be seen with adhesive capsulitis. Labrum: Limited evaluation secondary lack of intra-articular fluid. Superior labral degeneration. Bones:  No acute osseous abnormality.  No aggressive osseous lesion. IMPRESSION: 1. Mild tendinosis of the infraspinatus tendon with a tiny insertional interstitial tear and subcortical reactive marrow changes 2. Mild tendinosis of the intra-articular portion of the long head of the biceps tendon. 3. Thickening of the inferior joint capsule as can be seen with adhesive capsulitis. Electronically Signed   By: Kathreen Devoid   On: 12/26/2016 09:20      Assessment and Plan: 66 y.o. male with right shoulder pain mostly due to adhesive capsulitis.  Large volume glenohumeral injection today.  Additionally will proceed with physical therapy and recheck in about a month. MRI reviewed.    Orders Placed This Encounter  Procedures  . Ambulatory referral to Physical  Therapy    Referral Priority:   Routine    Referral Type:   Physical Medicine    Referral Reason:   Specialty Services Required    Requested Specialty:   Physical Therapy   No orders of the defined types were placed in this encounter.   Discussed warning signs or symptoms. Please see discharge instructions. Patient expresses understanding.  I spent 25 minutes with this patient, greater than 50% was face-to-face time counseling regarding ddx and treatment plan.

## 2016-12-27 NOTE — Patient Instructions (Signed)
Thank you for coming in today. Call or go to the ER if you develop a large red swollen joint with extreme pain or oozing puss.  Attend PT.  Recheck in 4 weeks.  Return sooner if needed.    Adhesive Capsulitis Adhesive capsulitis is inflammation of the tendons and ligaments that surround the shoulder joint (shoulder capsule). This condition causes the shoulder to become stiff and painful to move. Adhesive capsulitis is also called frozen shoulder. What are the causes? This condition may be caused by:  An injury to the shoulder joint.  Straining the shoulder.  Not moving the shoulder for a period of time. This can happen if your arm was injured or in a sling.  Long-standing health problems, such as: ? Diabetes. ? Thyroid problems. ? Heart disease. ? Stroke. ? Rheumatoid arthritis. ? Lung disease.  In some cases, the cause may not be known. What increases the risk? This condition is more likely to develop in:  Women.  People who are older than 67 years of age.  What are the signs or symptoms? Symptoms of this condition include:  Pain in the shoulder when moving the arm. There may also be pain when parts of the shoulder are touched. The pain is worse at night or when at rest.  Soreness or aching in the shoulder.  Inability to move the shoulder normally.  Muscle spasms.  How is this diagnosed? This condition is diagnosed with a physical exam and imaging tests, such as an X-ray or MRI. How is this treated? This condition may be treated with:  Treatment of the underlying cause or condition.  Physical therapy. This involves performing exercises to get the shoulder moving again.  Medicine. Medicine may be given to relieve pain, inflammation, or muscle spasms.  Steroid injections into the shoulder joint.  Shoulder manipulation. This is a procedure to move the shoulder into another position. It is done after you are given a medicine to make you fall asleep (general  anesthetic). The joint may also be injected with salt water at high pressure to break down scarring.  Surgery. This may be done in severe cases when other treatments have failed.  Although most people recover completely from adhesive capsulitis, some may not regain the full movement of the shoulder. Follow these instructions at home:  Take over-the-counter and prescription medicines only as told by your health care provider.  If you are being treated with physical therapy, follow instructions from your physical therapist.  Avoid exercises that put a lot of demand on your shoulder, such as throwing. These exercises can make pain worse.  If directed, apply ice to the injured area: ? Put ice in a plastic bag. ? Place a towel between your skin and the bag. ? Leave the ice on for 20 minutes, 2-3 times per day. Contact a health care provider if:  You develop new symptoms.  Your symptoms get worse. This information is not intended to replace advice given to you by your health care provider. Make sure you discuss any questions you have with your health care provider. Document Released: 10/24/2008 Document Revised: 06/04/2015 Document Reviewed: 04/21/2014 Elsevier Interactive Patient Education  [employer].

## 2016-12-27 NOTE — Telephone Encounter (Signed)
Patient request refill for Hydrocodone sent to his pharmacy. Please advise. Celicia Minahan,CMA

## 2016-12-30 ENCOUNTER — Ambulatory Visit (INDEPENDENT_AMBULATORY_CARE_PROVIDER_SITE_OTHER): Payer: Medicare Other | Admitting: Physical Therapy

## 2016-12-30 DIAGNOSIS — M6281 Muscle weakness (generalized): Secondary | ICD-10-CM

## 2016-12-30 DIAGNOSIS — M25511 Pain in right shoulder: Secondary | ICD-10-CM

## 2016-12-30 DIAGNOSIS — M25611 Stiffness of right shoulder, not elsewhere classified: Secondary | ICD-10-CM | POA: Diagnosis not present

## 2016-12-30 DIAGNOSIS — M542 Cervicalgia: Secondary | ICD-10-CM

## 2016-12-30 NOTE — Patient Instructions (Signed)
Cane Exercise: Flexion    Lie on back, holding cane above chest. Keeping arms as straight as possible, lower cane toward floor beyond head. Hold ___3-5_ seconds. Repeat __10__ times. Do __2__ sessions per day.  SHOULDER: External Rotation - Supine (Cane)    Hold cane with both hands. Rotate arm away from body. Keep elbow on floor and next to body. __10_ reps per set, __1-2_ sets per day, _7__ days per week Add towel to keep elbow at side.  SCAPULA: Retraction    Hold cane with both hands. Pinch shoulder blades together. Do not shrug shoulders. Hold _5-10__ seconds.  _10__ reps per set, _2-3__ sets per day, __7_ days per week  Axial Extension (Chin Tuck)    Gently pull chin in while lengthening back of neck. Hold _5-10___ seconds while counting out loud. Repeat _10___ times. Do __3__ sessions per day.  Resisted External Rotation: in Neutral - Bilateral  PALMS UP, elbows tucked at side!!! Sit or stand, tubing in both hands, elbows at sides, bent to 90, forearms forward. Pinch shoulder blades together and rotate forearms out. Keep elbows at sides. Repeat __10__ times per set. Do __2-3__ sets per session. Do __3-4__ sessions per week.

## 2016-12-30 NOTE — Therapy (Signed)
[place], [place], [address] Phone: [phone]   Fax:  [phone]  Physical Therapy Evaluation  Patient Details  Name: Walter Wilkerson MRN: [id number] Date of Birth: Nov 24, 1950 Referring Provider: Dr Lynne Leader   Encounter Date: 12/30/2016  PT End of Session - 12/30/16 1016    Visit Number  1    Number of Visits  8    Date for PT Re-Evaluation  01/27/17 trial till he sees MD again    PT Start Time  1016    PT Stop Time  1120    PT Time Calculation (min)  64 min    Activity Tolerance  Patient limited by pain       Past Medical History:  Diagnosis Date  . Hypertension   . Lung cancer ([place])   . Thyroid disease     Past Surgical History:  Procedure Laterality Date  . LUNG LOBECTOMY Right 2008   RLL resection  . VASECTOMY      There were no vitals filed for this visit.   Subjective Assessment - 12/30/16 1017    Subjective  Pt reports he developed Rt shoulder pain and motion limitations the first part of Nov after having his flu/pnumonia vaccination. HAd an injection 2-3 days ago and hasn't had any relief.     Pertinent History  lung CA 2008    Diagnostic tests  MRI small tear in RTC Rt with tendonitis.     Patient Stated Goals  get rid of the pain    Currently in Pain?  Yes    Pain Score  4     Pain Location  Shoulder    Pain Orientation  Right    Pain Descriptors / Indicators  Sharp;Radiating    Pain Type  Acute pain    Pain Onset  More than a month ago    Pain Frequency  Constant    Aggravating Factors   lifitng and moving the arm.    Pain Relieving Factors  muscle rub - perscription helps some         [facility] PT Assessment - 12/30/16 0001      Assessment   Medical Diagnosis  Rt shoulder adhesive capsulitis    Referring Provider  Dr Lynne Leader    Onset Date/Surgical Date  11/10/16    Hand Dominance  Right    Next MD Visit  01/31/17    Prior Therapy  none       Precautions   Precautions  None      Balance Screen   Has the patient fallen in the past 6 months  No      [place] residence    Living Arrangements  Spouse/significant other      Prior Function   Level of Independence  Independent with modification    Vocation  Full time employment    Forensic psychologist at [employer], ride [employer]    Leisure  college football, lacrosse, exercise, fish      Observation/Other Assessments   Focus on Therapeutic Outcomes (FOTO)   53% limited      Posture/Postural Control   Posture/Postural Control  Postural limitations    Postural Limitations  Rounded Shoulders;Forward head      ROM / Strength   AROM / PROM / Strength  AROM;PROM;Strength      AROM   AROM Assessment Site  Shoulder;Elbow;Cervical    Right/Left Shoulder  Right Lt WNL    Right Shoulder Extension  54 Degrees    Right Shoulder Flexion  87 Degrees    Right Shoulder ABduction  95 Degrees    Right Shoulder Internal Rotation  55 Degrees    Right Shoulder External Rotation  65 Degrees    Cervical Flexion  39 with pain    Cervical Extension  28 with pain    Cervical - Right Side Bend  28    Cervical - Left Side Bend  21 with pain    Cervical - Right Rotation  60    Cervical - Left Rotation  62      PROM   PROM Assessment Site  Shoulder    Right/Left Shoulder  Right    Right Shoulder Flexion  148 Degrees    Right Shoulder ABduction  80 Degrees    Right Shoulder Internal Rotation  80 Degrees    Right Shoulder External Rotation  65 Degrees      Strength   Strength Assessment Site  Shoulder    Right/Left Shoulder  -- Lt WNL, Rt grossly 4/5 in available range with pain      Palpation   Spinal mobility  NA at this visit, may need this at future visit.     Palpation comment  clunking in Rt shoulder with GH mobs, no pain with clavicle mobs, some tightness aroung the lateral border of the Rt scapula, tightness and  tenderness in the Rt upper trap and levator.              Objective measurements completed on examination: See above findings.      [place] Adult PT Treatment/Exercise - 12/30/16 0001      Neck Exercises: Seated   Other Seated Exercise  chin tucks x 5 sec x 10 reps, tactile cues for form      Shoulder Exercises: Supine   External Rotation  AAROM;Right;10 reps cane    Flexion  AAROM;Right;10 reps cane      Shoulder Exercises: Seated   External Rotation  Strengthening;Both;10 reps;Theraband    Theraband Level (Shoulder External Rotation)  Level 1 (Yellow)    Other Seated Exercises  scap retraction x 10 sec x 10 reps      Modalities   Modalities  Electrical Stimulation;Moist Heat      Moist Heat Therapy   Number Minutes Moist Heat  15 Minutes Rt    Moist Heat Location  Shoulder      Electrical Stimulation   Electrical Stimulation Location  Rt shoulder and upper trap    Electrical Stimulation Action  IFC    Electrical Stimulation Parameters   to tolerance     Electrical Stimulation Goals  Pain;Tone             PT Education - 12/30/16 1106    Education provided  Yes    Education Details  POC, HEP, issued yellow band    Person(s) Educated  Patient    Methods  Explanation;Demonstration;Verbal cues;Tactile cues;Handout    Comprehension  Returned demonstration;Verbalized understanding          PT Long Term Goals - 12/30/16 1016      PT LONG TERM GOAL #1   Title  I with advanced HEP to include return to gym resistance equipment (01/27/17)      Time  4    Period  Weeks    Status  New      PT LONG TERM GOAL #2   Title  improve FOTO =/< 31% limited ( 01/27/17)     Time  4    Period  Weeks    Status  New      PT LONG TERM GOAL #3   Title  demo Rt shoulder ROM WFL to allow him to place items on shoulder height shelves without difficulty ( 01/27/17)     Time  4    Period  Weeks    Status  New      PT LONG TERM GOAL #4   Title  demo cervical motion WFL  without pain ( 01/27/17)     Time  4    Period  Weeks    Status  New      PT LONG TERM GOAL #5   Title  Rt UE strength =/> 4+/5 without pain to allow him to return to lifting ( 01/27/17)     Time  4    Period  Weeks    Status  New      Additional Long Term Goals   Additional Long Term Goals  Yes      PT LONG TERM GOAL #6   Title  report Rt shoulder/neck pain reduction to allow him to sleep and perform bathing/dressing per his previous level ( 01/27/17)     Time  4    Period  Weeks    Status  New             Plan - 26-Jan-2017 1053    Clinical Impression Statement  66 yo male presents with ~ 2 month h/o Rt shoulder pain, he had an injection this week without relief.  MRI show partial tear in Rt infraspinatus with tendonitis here and in biceps tendon along with thickening in the inferior capsule. He had a lot of clunking with [place] mobs, instability felt.  Pain with all resistance of Rt shoulder along with decreased ROM in his neck and Rt shoulder.  There is muscular tightness in the Rt upper trap and levator and posterior rt shoulder muscles.     Clinical Presentation  Stable    Clinical Decision Making  Low    Rehab Potential  Good    PT Frequency  2x / week    PT Duration  4 weeks    PT Treatment/Interventions  Iontophoresis 4mg /ml Dexamethasone;Dry needling;Manual techniques;Moist Heat;Ultrasound;Patient/family education;Taping;Vasopneumatic Device;Therapeutic exercise;Cryotherapy;Electrical Stimulation;Passive range of motion    PT Next Visit Plan  Rt RTC and scapular strengthening, postural correction, modalities for pain, Rt shoulder ROM     Consulted and Agree with Plan of Care  Patient       Patient will benefit from skilled therapeutic intervention in order to improve the following deficits and impairments:  Pain, Improper body mechanics, Increased muscle spasms, Postural dysfunction, Impaired UE functional use, Decreased strength, Decreased range of motion  Visit  Diagnosis: Stiffness of right shoulder, not elsewhere classified - Plan: PT plan of care cert/re-cert  Acute pain of right shoulder - Plan: PT plan of care cert/re-cert  Muscle weakness (generalized) - Plan: PT plan of care cert/re-cert  Cervicalgia - Plan: PT plan of care cert/re-cert  [facility] PT PB G-CODES - 01-26-17 1104    Functional Limitations  Carrying, moving and handling objects    Carrying, Moving and Handling Objects Current Status (Q4696)  At least 40 percent but less than 60 percent impaired, limited or restricted    Carrying, Moving and Handling Objects Goal Status (E9528)  At least 20 percent but less than 40 percent  impaired, limited or restricted        Problem List Patient Active Problem List   Diagnosis Date Noted  . Adhesive capsulitis 12/27/2016  . Fatigue 11/04/2016  . Stress at work 11/04/2016  . Elevated serum creatinine 02/10/2016  . Cyst of pharynx 01/12/2016  . Aortic atherosclerosis ([place]) 01/12/2016  . Dermatitis 10/18/2015  . Prediabetes 04/15/2015  . Hyperglycemia 04/14/2015  . Shoulder impingement 02/24/2015  . Acquired hypothyroidism 08/11/2014  . Essential hypertension 08/11/2014  . Hyperlipidemia 08/11/2014  . Peripheral neuropathy ([place]) 08/11/2014  . Non-small cell lung cancer ([place]) 08/11/2014    Boneta Lucks rPT  12/30/2016, 12:12 PM  [facility] [place], [place], [address] Phone: [phone]   Fax:  [phone]  Name: Walter Wilkerson MRN: [id number] Date of Birth: [birth year]

## 2017-01-05 ENCOUNTER — Encounter: Payer: Medicare Other | Admitting: Physical Therapy

## 2017-01-09 ENCOUNTER — Telehealth: Payer: Self-pay | Admitting: Physical Therapy

## 2017-01-09 ENCOUNTER — Encounter: Payer: Medicare Other | Admitting: Physical Therapy

## 2017-01-09 NOTE — Telephone Encounter (Signed)
Called patient regarding his missed therapy appointment at 9:30am.  Patient apologized; he had forgotten that he had an appointment today.  He rescheduled to Wednesday at 7:15am.   Kerin Perna, PTA 01/09/17 9:53 AM

## 2017-01-11 ENCOUNTER — Ambulatory Visit (INDEPENDENT_AMBULATORY_CARE_PROVIDER_SITE_OTHER): Payer: Medicare Other | Admitting: Physical Therapy

## 2017-01-11 DIAGNOSIS — M25611 Stiffness of right shoulder, not elsewhere classified: Secondary | ICD-10-CM

## 2017-01-11 DIAGNOSIS — M6281 Muscle weakness (generalized): Secondary | ICD-10-CM | POA: Diagnosis not present

## 2017-01-11 DIAGNOSIS — M25511 Pain in right shoulder: Secondary | ICD-10-CM | POA: Diagnosis not present

## 2017-01-11 NOTE — Therapy (Signed)
[place], [place], [address] Phone: [phone]   Fax:  [phone]  Physical Therapy Treatment  Patient Details  Name: Walter Wilkerson MRN: [id number] Date of Birth: Jul 11, 1950 Referring Provider: Dr Lynne Leader   Encounter Date: 01/11/2017  PT End of Session - 01/11/17 0758    Visit Number  2    Number of Visits  8    Date for PT Re-Evaluation  01/27/17 trial until he sees MD    PT Start Time  0715    PT Stop Time  0750    PT Time Calculation (min)  35 min    Activity Tolerance  Patient tolerated treatment well    Behavior During Therapy  [facility] for tasks assessed/performed       Past Medical History:  Diagnosis Date  . Hypertension   . Lung cancer ([place])   . Thyroid disease     Past Surgical History:  Procedure Laterality Date  . LUNG LOBECTOMY Right 2008   RLL resection  . VASECTOMY      There were no vitals filed for this visit.  Subjective Assessment - 01/11/17 0719    Subjective  "Things are much better. It's a lot looser, but just weak.".  Pt received a TENS unit for Christmas and has used it a few times.  His arm bothered him yesterday when he was taking holiday decorations down, but used TENS and this helped reduce pain.     Patient Stated Goals  get rid of the pain    Currently in Pain?  No/denies    Pain Score  0-No pain         [facility] PT Assessment - 01/11/17 0001      Assessment   Medical Diagnosis  Rt shoulder adhesive capsulitis      AROM   Right/Left Shoulder  Right    Right Shoulder Extension  50 Degrees    Right Shoulder Flexion  125 Degrees    Right Shoulder ABduction  125 Degrees    Right Shoulder Internal Rotation  -- thumb to T8    Right Shoulder External Rotation  82 Degrees supine; abd 80 deg    Cervical Flexion  50    Cervical Extension  45    Cervical - Right Side Bend  35    Cervical - Left Side Bend  32 with twinge at end range        [facility] Adult  PT Treatment/Exercise - 01/11/17 0001      Shoulder Exercises: Supine   External Rotation  AAROM;Right;10 reps cane    Theraband Level (Shoulder External Rotation)  Level 2 (Red) 1 set of 5 with red for HEP    Flexion  10 reps;Strengthening;Theraband overhead pull, range within tolerance    Theraband Level (Shoulder Flexion)  Level 2 (Red)    Other Supine Exercises  Sash with RUE x 10 reps with red, 10 reps with green.       Shoulder Exercises: Standing   External Rotation  Strengthening;10 reps;Theraband    Theraband Level (Shoulder External Rotation)  Level 1 (Yellow);Level 2 (Red) 2 sets, 1 with yellow and 1 with red.    Extension  Strengthening;Both;10 reps;Theraband    Theraband Level (Shoulder Extension)  Level 3 (Green)    Row  Strengthening;Both;10 reps;Theraband 2 sets    Theraband Level (Shoulder Row)  Level 3 (Green)      Shoulder Exercises: Pulleys   Flexion  3 minutes  ABduction  3 minutes      Shoulder Exercises: Stretch   Other Shoulder Stretches  midlevel doorway stretch x 2 reps of 30 sec      Modalities   Modalities  -- pt declined; no pain at end of session.Will use TENS at home             PT Education - 01/11/17 0744    Education provided  Yes    Education Details  HEP - issued red and green bands     Person(s) Educated  Patient    Methods  Explanation;Handout    Comprehension  Verbalized understanding;Returned demonstration          PT Long Term Goals - 01/11/17 0721      PT LONG TERM GOAL #1   Title  I with advanced HEP to include return to gym resistance equipment (01/27/17)      Time  4    Period  Weeks    Status  On-going      PT LONG TERM GOAL #2   Title  improve FOTO =/< 31% limited ( 01/27/17)     Time  4    Period  Weeks    Status  On-going      PT LONG TERM GOAL #3   Title  demo Rt shoulder ROM WFL to allow him to place items on shoulder height shelves without difficulty ( 01/27/17)     Time  4    Period  Weeks    Status   On-going progressing, can reach shoulder height with little difficulty      PT LONG TERM GOAL #4   Title  demo cervical motion WFL without pain ( 01/27/17)     Time  4    Period  Weeks    Status  On-going      PT LONG TERM GOAL #5   Title  Rt UE strength =/> 4+/5 without pain to allow him to return to lifting ( 01/27/17)     Time  4    Period  Weeks    Status  On-going      PT LONG TERM GOAL #6   Title  report Rt shoulder/neck pain reduction to allow him to sleep and perform bathing/dressing per his previous level ( 01/27/17)     Status  Achieved            Plan - 01/11/17 0723    Clinical Impression Statement  Pt has met LTG #6; no longer waking due to pain and is able to complete ADLs without difficulty.  Pt demonstrated improved Rt shoulder and neck ROM with less pain. He tolerated all exercises well, without increase in pain.  Pt making good progress towards goals.     Rehab Potential  Good    PT Frequency  2x / week    PT Duration  4 weeks    PT Treatment/Interventions  Iontophoresis 56m/ml Dexamethasone;Dry needling;Manual techniques;Moist Heat;Ultrasound;Patient/family education;Taping;Vasopneumatic Device;Therapeutic exercise;Cryotherapy;Electrical Stimulation;Passive range of motion    PT Next Visit Plan  continue progressive Rt shoulder ROM and strengthening.  Add doorway stretch to HEP.     Consulted and Agree with Plan of Care  Patient       Patient will benefit from skilled therapeutic intervention in order to improve the following deficits and impairments:  Pain, Improper body mechanics, Increased muscle spasms, Postural dysfunction, Impaired UE functional use, Decreased strength, Decreased range of motion  Visit Diagnosis: Stiffness of right shoulder, not elsewhere  classified  Acute pain of right shoulder  Muscle weakness (generalized)     Problem List Patient Active Problem List   Diagnosis Date Noted  . Adhesive capsulitis 12/27/2016  . Fatigue  11/04/2016  . Stress at work 11/04/2016  . Elevated serum creatinine 02/10/2016  . Cyst of pharynx 01/12/2016  . Aortic atherosclerosis ([place]) 01/12/2016  . Dermatitis 10/18/2015  . Prediabetes 04/15/2015  . Hyperglycemia 04/14/2015  . Shoulder impingement 02/24/2015  . Acquired hypothyroidism 08/11/2014  . Essential hypertension 08/11/2014  . Hyperlipidemia 08/11/2014  . Peripheral neuropathy ([place]) 08/11/2014  . Non-small cell lung cancer [facility]) 08/11/2014   Kerin Perna, PTA 01/11/17 7:59 AM  [place], [place], [address] Phone: [phone]   Fax:  [phone]  Name: Walter Wilkerson MRN: [id number] Date of Birth: [birth year]

## 2017-01-11 NOTE — Patient Instructions (Signed)
  Resistive Band Rowing   With resistive band anchored in door, grasp both ends. Keeping elbows bent, pull back, squeezing shoulder blades together. Hold _3-5___ seconds. Repeat _10-30___ times. Do __1__ sessions per day.   Strengthening: Resisted Extension   Hold tubing with both hands, arms forward. Pull arms back, elbow straight. Repeat _10-30___ times per set. Do ____ sets per session. Do _1___ sessions per day.  Over Head Pull: Narrow Grip        On back, knees bent, feet flat, band across thighs, elbows straight but relaxed. Pull hands apart (start). Keeping elbows straight, bring arms up and over head, hands toward floor. Keep pull steady on band. Hold momentarily. Return slowly, keeping pull steady, back to start. Repeat __10_ times. Band color ___red___    Elmer Picker   On back, knees bent, feet flat, left hand on left hip, right hand above left. Pull right arm DIAGONALLY (hip to shoulder) across chest. Bring right arm along head toward floor. Hold momentarily. Slowly return to starting position. Repeat _10__ times. Do with left arm. Band color __red/green____   Shoulder Rotation: Double Arm   On back, knees bent, feet flat, elbows tucked at sides, bent 90, hands palms up. Pull hands apart and down toward floor, keeping elbows near sides. Hold momentarily. Slowly return to starting position. Repeat _10__ times. Band color __red____   ** do this OR the standing version, don't need to do both  [facility] [place], [place] [address]  [phone] (office) [phone] (fax)

## 2017-01-13 ENCOUNTER — Encounter: Payer: Medicare Other | Admitting: Physical Therapy

## 2017-01-16 ENCOUNTER — Ambulatory Visit: Payer: Medicare Other | Admitting: Physical Therapy

## 2017-01-16 DIAGNOSIS — M6281 Muscle weakness (generalized): Secondary | ICD-10-CM

## 2017-01-16 DIAGNOSIS — M25511 Pain in right shoulder: Secondary | ICD-10-CM

## 2017-01-16 DIAGNOSIS — M25611 Stiffness of right shoulder, not elsewhere classified: Secondary | ICD-10-CM | POA: Diagnosis not present

## 2017-01-16 NOTE — Therapy (Signed)
[place], [place], [address] Phone: [phone]   Fax:  [phone]  Physical Therapy Treatment  Patient Details  Name: Walter Wilkerson MRN: [id number] Date of Birth: [birth year] Referring Provider: Dr. Lynne Leader   Encounter Date: 01/16/2017  PT End of Session - 01/16/17 1009    Visit Number  3    Number of Visits  8    Date for PT Re-Evaluation  01/27/17 trial until he sees MD.    PT Start Time  0930    PT Stop Time  1009    PT Time Calculation (min)  39 min    Activity Tolerance  Patient tolerated treatment well;No increased pain    Behavior During Therapy  WFL for tasks assessed/performed       Past Medical History:  Diagnosis Date  . Hypertension   . Lung cancer ([place])   . Thyroid disease     Past Surgical History:  Procedure Laterality Date  . LUNG LOBECTOMY Right 2008   RLL resection  . VASECTOMY      There were no vitals filed for this visit.  Subjective Assessment - 01/16/17 0934    Subjective  Pt reports he no longer has pain, and he feels ROM has improved.  He feels his Rt shoulder is weak still.     Diagnostic tests  MRI small tear in RTC Rt with tendonitis.     Patient Stated Goals  get rid of the pain    Currently in Pain?  No/denies    Pain Score  0-No pain         [facility] PT Assessment - 01/16/17 0001      Assessment   Medical Diagnosis  Rt shoulder adhesive capsulitis    Referring Provider  Dr. Lynne Leader    Onset Date/Surgical Date  11/10/16    Hand Dominance  Right    Next MD Visit  01/31/17    Prior Therapy  none      AROM   Right/Left Shoulder  Right    Right Shoulder Extension  55 Degrees    Right Shoulder Flexion  132 Degrees LUE 144 deg    Right Shoulder ABduction  125 Degrees    Right Shoulder Internal Rotation  -- thumb to T8    Right Shoulder External Rotation  84 Degrees supine; abd 80 deg    Cervical Flexion  52    Cervical Extension  45    Cervical - Right Side Bend  35    Cervical - Left Side Bend  32    Cervical - Right Rotation  70    Cervical - Left Rotation  75      Strength   Right/Left Shoulder  Right    Right Shoulder Flexion  3+/5 no pain    Right Shoulder Extension  -- 5-/5    Right Shoulder ABduction  4-/5    Right Shoulder Internal Rotation  -- 5-/5    Right Shoulder External Rotation  4+/5       [facility] Adult PT Treatment/Exercise - 01/16/17 0001      Neck Exercises: Seated   Other Seated Exercise  upper trap stretch x 15 sec x 2 reps each side      Shoulder Exercises: Supine   Other Supine Exercises  elbow press x 30 sec x 2 reps      Shoulder Exercises: Standing   External Rotation  Strengthening;Both;10 reps;Theraband in front of mirror  Theraband Level (Shoulder External Rotation)  Level 2 (Red)    Flexion  Strengthening;Left;10 reps;Theraband;Weights to 90 deg    Theraband Level (Shoulder Flexion)  Level 1 (Yellow)    Shoulder Flexion Weight (lbs)  1# x 10 in front of mirror.     ABduction  Strengthening;Left;10 reps;Weights scaption to 90 deg    Shoulder ABduction Weight (lbs)  1#    Other Standing Exercises  at shoulder height shelf x 5 reps with 1# (eccentric control), 2# x 10     Other Standing Exercises  wall push up x 10; reverse wall push ups x 10; counter push up x 10      Shoulder Exercises: Pulleys   Flexion  3 minutes    ABduction  3 minutes      Modalities   Modalities  -- pt declined; no pain at end of session.Will use TENS at home                  PT Long Term Goals - 01/16/17 0935      PT LONG TERM GOAL #1   Title  I with advanced HEP to include return to gym resistance equipment (01/27/17)      Time  4    Period  Weeks    Status  On-going      PT LONG TERM GOAL #2   Title  improve FOTO =/< 31% limited ( 01/27/17)     Time  4    Period  Weeks    Status  On-going      PT LONG TERM GOAL #3   Title  demo Rt shoulder ROM WFL to allow him to place items on  shoulder height shelves without difficulty ( 01/27/17)     Time  4    Period  Weeks    Status  Achieved      PT LONG TERM GOAL #4   Title  demo cervical motion WFL without pain ( 01/27/17)     Time  4    Period  Weeks    Status  Achieved      PT LONG TERM GOAL #5   Title  Rt UE strength =/> 4+/5 without pain to allow him to return to lifting ( 01/27/17)     Time  4    Period  Weeks    Status  Partially Met      PT LONG TERM GOAL #6   Title  report Rt shoulder/neck pain reduction to allow him to sleep and perform bathing/dressing per his previous level ( 01/27/17)     Time  4    Period  Weeks    Status  Achieved            Plan - 01/16/17 1010    Clinical Impression Statement  Pt demonstrated improved Rt shoulder ROM and strength.  He is quite limited with Rt shoulder flexion strength. Added strengthening exercise to address this.  Pt has met LTG#3 and 4, and partially met 5. He tolerated all exercises without increase in symptoms, other than fatigue.     Rehab Potential  Good    PT Frequency  2x / week    PT Duration  4 weeks    PT Treatment/Interventions  Iontophoresis 85m/ml Dexamethasone;Dry needling;Manual techniques;Moist Heat;Ultrasound;Patient/family education;Taping;Vasopneumatic Device;Therapeutic exercise;Cryotherapy;Electrical Stimulation;Passive range of motion    PT Next Visit Plan  continue progressive Rt shoulder ROM and strengthening.      Consulted and Agree with Plan of Care  Patient       Patient will benefit from skilled therapeutic intervention in order to improve the following deficits and impairments:  Pain, Improper body mechanics, Increased muscle spasms, Postural dysfunction, Impaired UE functional use, Decreased strength, Decreased range of motion  Visit Diagnosis: Stiffness of right shoulder, not elsewhere classified  Acute pain of right shoulder  Muscle weakness (generalized)     Problem List Patient Active Problem List   Diagnosis Date  Noted  . Adhesive capsulitis 12/27/2016  . Fatigue 11/04/2016  . Stress at work 11/04/2016  . Elevated serum creatinine 02/10/2016  . Cyst of pharynx 01/12/2016  . Aortic atherosclerosis ([place]) 01/12/2016  . Dermatitis 10/18/2015  . Prediabetes 04/15/2015  . Hyperglycemia 04/14/2015  . Shoulder impingement 02/24/2015  . Acquired hypothyroidism 08/11/2014  . Essential hypertension 08/11/2014  . Hyperlipidemia 08/11/2014  . Peripheral neuropathy ([place]) 08/11/2014  . Non-small cell lung cancer ([place]) 08/11/2014   Kerin Perna, PTA 01/16/17 10:13 AM  [name], [place], [address] Phone: [phone]   Fax:  [phone]  Name: Walter Wilkerson MRN: [id number] Date of Birth: Dec 17, 1950

## 2017-01-16 NOTE — Patient Instructions (Signed)
SHOULDER: Flexion Unilateral (Weight)    Sitting or standing: Start with arm at side. Raise arm forward and up. Keep elbow straight. Use _1-2__ lb weight. _10_ reps per set, _2__ sets per day,    Shoulder Flexion: Standing - Diagonal 2 (Single Arm)    In shoulder width stance, anchor tubing at hip opposite exercising arm. OR - hold 1-2# weight.  Thumb in, arm across body, pull forward and up, rotating to thumb back. Repeat _10_ times per set. Repeat with other arm. Do _2_ sets per session.

## 2017-01-23 ENCOUNTER — Encounter: Payer: Self-pay | Admitting: Physical Therapy

## 2017-01-23 ENCOUNTER — Ambulatory Visit: Payer: Medicare Other | Admitting: Physical Therapy

## 2017-01-23 DIAGNOSIS — M6281 Muscle weakness (generalized): Secondary | ICD-10-CM | POA: Diagnosis not present

## 2017-01-23 DIAGNOSIS — M25611 Stiffness of right shoulder, not elsewhere classified: Secondary | ICD-10-CM

## 2017-01-23 NOTE — Therapy (Addendum)
[place], [place], [address] Phone: [phone]   Fax:  [phone]  Physical Therapy Treatment  Patient Details  Name: Walter Wilkerson MRN: [id number] Date of Birth: Apr 16, 1950 Referring Provider: Dr. Lynne Leader   Encounter Date: 01/23/2017  PT End of Session - 01/23/17 0844    Visit Number  4    Number of Visits  8    Date for PT Re-Evaluation  01/27/17 trial until MD appt    PT Start Time  0842    PT Stop Time  0920    PT Time Calculation (min)  38 min    Activity Tolerance  Patient tolerated treatment well;No increased pain    Behavior During Therapy  WFL for tasks assessed/performed       Past Medical History:  Diagnosis Date  . Hypertension   . Lung cancer ([place])   . Thyroid disease     Past Surgical History:  Procedure Laterality Date  . LUNG LOBECTOMY Right 2008   RLL resection  . VASECTOMY      There were no vitals filed for this visit.  Subjective Assessment - 01/23/17 0845    Subjective  "I have no pain in my shoulder now".  He reports he still feels weak, but not as bad as last week.  "It is improving".     Diagnostic tests  MRI small tear in RTC Rt with tendonitis.     Currently in Pain?  No/denies    Pain Score  0-No pain         [facility] PT Assessment - 01/23/17 0001      Assessment   Medical Diagnosis  Rt shoulder adhesive capsulitis    Referring Provider  Dr. Lynne Leader    Onset Date/Surgical Date  11/10/16    Hand Dominance  Right    Next MD Visit  01/28/2016      Observation/Other Assessments   Focus on Therapeutic Outcomes (FOTO)   30% limited      AROM   Right/Left Shoulder  Right    Right Shoulder Extension  60 Degrees    Right Shoulder Flexion  135 Degrees    Right Shoulder ABduction  135 Degrees    Right Shoulder Internal Rotation  -- thumb to T8    Right Shoulder External Rotation  90 Degrees      Strength   Right Shoulder Flexion  4-/5    Right  Shoulder Extension  -- 5-/5    Right Shoulder ABduction  4+/5    Right Shoulder Internal Rotation  5/5    Right Shoulder External Rotation  4/5           [facility] Adult PT Treatment/Exercise - 01/23/17 0001      Shoulder Exercises: Seated   Other Seated Exercises  lat pull down with 3 plates x 10 reps (VC for posture and technique)      Shoulder Exercises: Standing   External Rotation  Strengthening;Both;10 reps;Theraband in front of mirror    Theraband Level (Shoulder External Rotation)  Level 2 (Red)    Flexion  Strengthening;Left;10 reps;Theraband;Weights to 90 deg    Theraband Level (Shoulder Flexion)  Level 1 (Yellow)    ABduction  Strengthening;Left;10 reps;Theraband scaption to 90 deg    Theraband Level (Shoulder ABduction)  Level 1 (Yellow)    Row  Strengthening;Both;10 reps;Theraband 2 sets    Theraband Level (Shoulder Row)  Level 3 (Green)    Other Standing Exercises  above shoulder height shelf x 4 reps with 2# (eccentric control), 3# x 7     Other Standing Exercises  Reverse wall push ups x 10; counter push up x 10; elbow flexion with cables (2 plates x 5 reps bilat); bicep curl RUE x 10 reps with 5#; tricep ext bilat with 1 plate x 10;       Shoulder Exercises: ROM/Strengthening   UBE (Upper Arm Bike)  L1-2: 1 min backward, 1 min forward.       Shoulder Exercises: Stretch   Wall Stretch - Flexion  2 reps;20 seconds    Other Shoulder Stretches  3 position doorway stretch x 30 sec x 2 reps, 2 sets.  Shoulder ext stretch (hands laced behind back) x 15 sec x 4 reps      Modalities   Modalities  -- pt declined; no pain at end of session.Will use TENS at home             PT Education - 01/23/17 0920    Education provided  Yes    Education Details  Pt educated on machines for UE to use at gym:  row, bicep, tricep, lat pull down, chest press. Added 3 position doorway stretch and flexion stretch (wall slide) to HEP.     Person(s) Educated  Patient    Methods  Verbal  cues;Demonstration;Explanation    Comprehension  Verbalized understanding;Returned demonstration          PT Long Term Goals - 01/23/17 0857      PT LONG TERM GOAL #1   Title  I with advanced HEP to include return to gym resistance equipment (01/27/17)      Time  4    Status  On-going      PT LONG TERM GOAL #2   Title  improve FOTO =/< 31% limited ( 01/27/17)     Time  4    Period  Weeks    Status  Achieved      PT LONG TERM GOAL #3   Title  demo Rt shoulder ROM WFL to allow him to place items on shoulder height shelves without difficulty ( 01/27/17)     Time  4    Period  Weeks    Status  Achieved      PT LONG TERM GOAL #4   Title  demo cervical motion WFL without pain ( 01/27/17)     Time  4    Period  Weeks    Status  Achieved      PT LONG TERM GOAL #5   Title  Rt UE strength =/> 4+/5 without pain to allow him to return to lifting ( 01/27/17)     Time  4    Period  Weeks    Status  Partially Met      PT LONG TERM GOAL #6   Title  report Rt shoulder/neck pain reduction to allow him to sleep and perform bathing/dressing per his previous level ( 01/27/17)     Time  4    Period  Weeks    Status  Achieved            Plan - 01/23/17 0858    Clinical Impression Statement  Pt demonstrating improved Rt shoulder ROM, near equal to LUE.  Rt shoulder flexion, abduction and ER strength continue to be limited.  Substitution patterns with shoulder flexion and abduction observed; req mirror and tactile cues to improve.  FOTO score improved to 30%, has  met this goal. He has partially met his goals and is making good progress each week towards goals.      Rehab Potential  Good    PT Frequency  2x / week    PT Duration  4 weeks    PT Treatment/Interventions  Iontophoresis 44m/ml Dexamethasone;Dry needling;Manual techniques;Moist Heat;Ultrasound;Patient/family education;Taping;Vasopneumatic Device;Therapeutic exercise;Cryotherapy;Electrical Stimulation;Passive range of motion    PT  Next Visit Plan  continue progressive Rt shoulder ROM and strengthening.      Consulted and Agree with Plan of Care  Patient       Patient will benefit from skilled therapeutic intervention in order to improve the following deficits and impairments:  Pain, Improper body mechanics, Increased muscle spasms, Postural dysfunction, Impaired UE functional use, Decreased strength, Decreased range of motion  Visit Diagnosis: Stiffness of right shoulder, not elsewhere classified  Muscle weakness (generalized)     Problem List Patient Active Problem List   Diagnosis Date Noted  . Adhesive capsulitis 12/27/2016  . Fatigue 11/04/2016  . Stress at work 11/04/2016  . Elevated serum creatinine 02/10/2016  . Cyst of pharynx 01/12/2016  . [place]ortic atherosclerosis ([place] 01/12/2016  . Dermatitis 10/18/2015  . Prediabetes 04/15/2015  . Hyperglycemia 04/14/2015  . Shoulder impingement 02/24/2015  . Acquired hypothyroidism 08/11/2014  . Essential hypertension 08/11/2014  . Hyperlipidemia 08/11/2014  . Peripheral neuropathy ([place] 08/11/2014  . Non-small cell lung cancer ([place] 08/11/2014   JKerin Perna PTA 01/23/17 9:25 AM  [address] [place] [address]Phone: [phone]  Fax:  [phone] Name: Walter NorwoodMRN: [id number] of Birth: 128-Jan-1952  PHYSICAL THERAPY DISCHARGE SUMMARY  Visits from Start of Care: 4  Current functional level related to goals / functional outcomes: See above, patient very pleased with his progress and function   Remaining deficits: See above   Education / Equipment: HEP Plan: Patient agrees to discharge.  Patient goals were partially met. Patient is being discharged due to being pleased with the current functional level.  ?????     SJeral Pinch PT 02/16/17 12:11 PM

## 2017-01-26 ENCOUNTER — Other Ambulatory Visit: Payer: Self-pay | Admitting: Physician Assistant

## 2017-01-26 DIAGNOSIS — M79643 Pain in unspecified hand: Secondary | ICD-10-CM

## 2017-01-27 ENCOUNTER — Encounter: Payer: Self-pay | Admitting: Family Medicine

## 2017-01-27 ENCOUNTER — Ambulatory Visit (INDEPENDENT_AMBULATORY_CARE_PROVIDER_SITE_OTHER): Payer: Medicare Other | Admitting: Family Medicine

## 2017-01-27 ENCOUNTER — Telehealth: Payer: Self-pay | Admitting: *Deleted

## 2017-01-27 ENCOUNTER — Ambulatory Visit (INDEPENDENT_AMBULATORY_CARE_PROVIDER_SITE_OTHER): Payer: Medicare Other

## 2017-01-27 VITALS — BP 185/80 | HR 80 | Ht 68.0 in | Wt 170.0 lb

## 2017-01-27 DIAGNOSIS — J90 Pleural effusion, not elsewhere classified: Secondary | ICD-10-CM | POA: Diagnosis not present

## 2017-01-27 DIAGNOSIS — I700 Atherosclerosis of aorta: Secondary | ICD-10-CM

## 2017-01-27 DIAGNOSIS — M7501 Adhesive capsulitis of right shoulder: Secondary | ICD-10-CM

## 2017-01-27 DIAGNOSIS — R079 Chest pain, unspecified: Secondary | ICD-10-CM | POA: Diagnosis not present

## 2017-01-27 DIAGNOSIS — E039 Hypothyroidism, unspecified: Secondary | ICD-10-CM | POA: Diagnosis not present

## 2017-01-27 LAB — T3, FREE: T3 FREE: 2.8 pg/mL (ref 2.3–4.2)

## 2017-01-27 LAB — T4, FREE: Free T4: 1.7 ng/dL (ref 0.8–1.8)

## 2017-01-27 LAB — TSH: TSH: 1.26 m[IU]/L (ref 0.40–4.50)

## 2017-01-27 MED ORDER — HYDROCODONE-ACETAMINOPHEN 10-325 MG PO TABS: 1.0000 | ORAL_TABLET | Freq: Four times a day (QID) | ORAL | 0 refills | Status: DC | PRN

## 2017-01-27 NOTE — Telephone Encounter (Signed)
Patient request refill request for Hydrocodone. Please send to patient pharmacy. Zykerria Tanton,CMA

## 2017-01-27 NOTE — Telephone Encounter (Signed)
[place] controlled substance database reviewed with no concerns.

## 2017-01-27 NOTE — Progress Notes (Signed)
Walter Wilkerson is a 67 y.o. male who presents to [facility] [place] today for follow-up with right shoulder adhesive capsulitis.    Walter Wilkerson has done extremely well in the last month.  He received a intra-articular glenohumeral large volume injection 1 month ago and dedicated physical therapy for right adhesive capsulitis.  He notes his pain has completely resolved and his range of motion has nearly returned to normal.  He feels great.  He does however note about a 1 week history of bilateral rib and back pain with deep inspiration and trunk motion.  He denies new or worsening shortness of breath.  He denies any pain with exertion.  He is able to exert himself fully without any significant chest pain.  He denies any change in cough fevers chills or weight loss.  He does have a history of right-sided small cell lung cancer more than 5 years ago thought to be cured/in long-term remission.  He notes that stretching helps his pain.  Additionally he notes that his PCP (my partner) has been following mildly elevated TSH levels and adjusting levothyroxine dose.  He notes that he is due for recheck TSH and thyroid labs today.     Past Medical History:  Diagnosis Date  . Hypertension   . Lung cancer ([place])   . Thyroid disease    Past Surgical History:  Procedure Laterality Date  . LUNG LOBECTOMY Right 2008   RLL resection  . VASECTOMY     Social History   Tobacco Use  . Smoking status: Former Smoker    Last attempt to quit: 08/10/2004    Years since quitting: 12.4  . Smokeless tobacco: Never Used  Substance Use Topics  . Alcohol use: Yes    Alcohol/week: 3.0 oz    Types: 5 Standard drinks or equivalent per week   family history includes Diabetes in his unknown relative; Heart attack in his father; Hyperlipidemia in his father; Hypertension in his father.  ROS as  above:  Medications: Current Outpatient Medications  Medication Sig Dispense Refill  . atorvastatin (LIPITOR) 40 MG tablet 20 mg.    . diclofenac sodium (VOLTAREN) 1 % GEL Apply 4 g topically 4 (four) times daily. To affected joint. 100 g 11  . gabapentin (NEURONTIN) 100 MG capsule TAKE ONE CAPSULE BY MOUTH 3 TIMES A DAY 270 capsule 1  . levothyroxine (SYNTHROID, LEVOTHROID) 150 MCG tablet Take 1 tablet (150 mcg total) by mouth daily before breakfast. 30 tablet 1  . meloxicam (MOBIC) 15 MG tablet TAKE ONE TABLET BY MOUTH EVERY DAY 30 tablet 1  . Multiple Vitamin (MULTIVITAMIN) tablet Take 1 tablet by mouth daily.    . valsartan-hydrochlorothiazide (DIOVAN-HCT) 160-25 MG tablet Take 1 tablet by mouth daily. 90 tablet 1  . HYDROcodone-acetaminophen (NORCO) 10-325 MG tablet Take by mouth.     No current facility-administered medications for this visit.    No Known Allergies  Health Maintenance Health Maintenance  Topic Date Due  . Hepatitis C Screening  02/04/2017 (Originally 09/15/1950)  . COLONOSCOPY  01/11/2020  . TETANUS/TDAP  09/21/2020  . INFLUENZA VACCINE  Completed  . PNA vac Low Risk Adult  Completed     Exam:  BP (!) 185/80   Pulse 80   Ht 5\' 8"  (1.727 m)   Wt 170 lb (77.1 kg)   BMI 25.85 kg/m  Gen: Well NAD HEENT: EOMI,  MMM Lungs: Normal work of breathing.  Decreased breath  sides right lung fields unchanged from prior study. Heart: RRR no MRG Abd: NABS, Soft. Nondistended, Nontender Exts: Brisk capillary refill, warm and well perfused.  MSK: Range of motion of the right shoulder is within normal limits pain-free.   No results found for this or any previous visit (from the past 72 hour(s)). No results found.    Assessment and Plan: 67 y.o. male with  Right adhesive capsulitis: Doing extremely well.  Continue physical therapy/home exercise program.  Recheck as needed.  Right chest wall pain is very likely muscular/rib dysfunction.  If he did not have a  history of lung cancer we would proceed with watchful waiting however given his history I think a chest x-ray is reasonable.  Recheck if not better.  Hypothyroidism: Recheck TSH and free T3/T4.    Orders Placed This Encounter  Procedures  . DG Chest 2 View    Order Specific Question:   Reason for exam:    Answer:   Cough, assess intra-thoracic pathology    Order Specific Question:   Preferred imaging location?    Answer:   [facility]  . T4, free  . T3, free  . TSH   No orders of the defined types were placed in this encounter.    Discussed warning signs or symptoms. Please see discharge instructions. Patient expresses understanding.

## 2017-01-27 NOTE — Telephone Encounter (Signed)
Pt left vm wanting to know if there was a specific reason why you lowered the quantity of his vicodin.  Please advise.

## 2017-01-27 NOTE — Telephone Encounter (Signed)
I just refilled last rx. I guess was written that way. Sent new rx.

## 2017-01-27 NOTE — Patient Instructions (Signed)
Thank you for coming in today. Get xray and labs today.  [facility] should refill Hydrocodone.  Recheck with me as needed.  If you are ok and getting better I will see you as needed.  If chest pain is not better we can continue the workup.    Chest Wall Pain Chest wall pain is pain in or around the bones and muscles of your chest. Sometimes, an injury causes this pain. Sometimes, the cause may not be known. This pain may take several weeks or longer to get better. Follow these instructions at home: Pay attention to any changes in your symptoms. Take these actions to help with your pain:  Rest as told by your doctor.  Avoid activities that cause pain. Try not to use your chest, belly (abdominal), or side muscles to lift heavy things.  If directed, apply ice to the painful area: ? Put ice in a plastic bag. ? Place a towel between your skin and the bag. ? Leave the ice on for 20 minutes, 2-3 times per day.  Take over-the-counter and prescription medicines only as told by your doctor.  Do not use tobacco products, including cigarettes, chewing tobacco, and e-cigarettes. If you need help quitting, ask your doctor.  Keep all follow-up visits as told by your doctor. This is important.  Contact a doctor if:  You have a fever.  Your chest pain gets worse.  You have new symptoms. Get help right away if:  You feel sick to your stomach (nauseous) or you throw up (vomit).  You feel sweaty or light-headed.  You have a cough with phlegm (sputum) or you cough up blood.  You are short of breath. This information is not intended to replace advice given to you by your health care provider. Make sure you discuss any questions you have with your health care provider. Document Released: 06/15/2007 Document Revised: 06/04/2015 Document Reviewed: 03/24/2014 Elsevier Interactive Patient Education  [employer].

## 2017-01-30 NOTE — Telephone Encounter (Signed)
Pt.notified

## 2017-02-09 ENCOUNTER — Other Ambulatory Visit: Payer: Self-pay | Admitting: Physician Assistant

## 2017-03-08 ENCOUNTER — Telehealth: Payer: Self-pay | Admitting: *Deleted

## 2017-03-08 ENCOUNTER — Other Ambulatory Visit: Payer: Self-pay | Admitting: Physician Assistant

## 2017-03-08 MED ORDER — HYDROCODONE-ACETAMINOPHEN 10-325 MG PO TABS: 1.0000 | ORAL_TABLET | Freq: Four times a day (QID) | ORAL | 0 refills | Status: DC | PRN

## 2017-03-08 NOTE — Telephone Encounter (Signed)
Done

## 2017-03-08 NOTE — Telephone Encounter (Signed)
Pt left vm requesting a refill of his Hydrocodone.  He is going out of town this Friday for 10 days.

## 2017-04-06 ENCOUNTER — Other Ambulatory Visit: Payer: Self-pay | Admitting: *Deleted

## 2017-04-06 NOTE — Telephone Encounter (Signed)
Pt left vm asking for a refill of his Hydrocodone.

## 2017-04-07 MED ORDER — HYDROCODONE-ACETAMINOPHEN 10-325 MG PO TABS: 1.0000 | ORAL_TABLET | Freq: Four times a day (QID) | ORAL | 0 refills | Status: DC | PRN

## 2017-04-20 ENCOUNTER — Ambulatory Visit (INDEPENDENT_AMBULATORY_CARE_PROVIDER_SITE_OTHER): Payer: Medicare Other | Admitting: Physician Assistant

## 2017-04-20 ENCOUNTER — Encounter: Payer: Self-pay | Admitting: Physician Assistant

## 2017-04-20 VITALS — BP 161/68 | HR 70 | Ht 68.0 in | Wt 178.0 lb

## 2017-04-20 DIAGNOSIS — R1013 Epigastric pain: Secondary | ICD-10-CM | POA: Insufficient documentation

## 2017-04-20 DIAGNOSIS — R110 Nausea: Secondary | ICD-10-CM | POA: Diagnosis not present

## 2017-04-20 DIAGNOSIS — K279 Peptic ulcer, site unspecified, unspecified as acute or chronic, without hemorrhage or perforation: Secondary | ICD-10-CM | POA: Diagnosis not present

## 2017-04-20 MED ORDER — OMEPRAZOLE 40 MG PO CPDR: 40.0000 mg | DELAYED_RELEASE_CAPSULE | Freq: Every day | ORAL | 1 refills | Status: DC

## 2017-04-20 MED ORDER — SUCRALFATE 1 G PO TABS: 1.0000 g | ORAL_TABLET | Freq: Four times a day (QID) | ORAL | 0 refills | Status: DC

## 2017-04-20 NOTE — Progress Notes (Signed)
Subjective:    Patient ID: Walter Wilkerson, male    DOB: 1950/10/13, 67 y.o.   MRN: [id number]  HPI Walter Wilkerson presents today with GI related complaints. He states that he is having pain in his epigastric region that radiates to the RUQ/LUQ, up into his chest and some into his back with associated nausea. He has a history of a ulcer's and says this feels similar. He denies any fevers, chills, exertional chest pain, shortness of breath. He was never tested for H. Pylori in the past and has never had an EGD completed. He has been taking OTC tums with mild relief. He states the pain improves after eating food for about 1 hour but then returns. He denies any blood in his stool or having thrown up any blood.   Patient has a hx of lung cancer and cancer is concerning for patient.   .. Active Ambulatory Problems    Diagnosis Date Noted  . Acquired hypothyroidism 08/11/2014  . Essential hypertension 08/11/2014  . Hyperlipidemia 08/11/2014  . Peripheral neuropathy ([place]) 08/11/2014  . Non-small cell lung cancer ([place]) 08/11/2014  . Shoulder impingement 02/24/2015  . Hyperglycemia 04/14/2015  . Prediabetes 04/15/2015  . Dermatitis 10/18/2015  . Cyst of pharynx 01/12/2016  . Aortic atherosclerosis ([place]) 01/12/2016  . Elevated serum creatinine 02/10/2016  . Fatigue 11/04/2016  . Stress at work 11/04/2016  . Adhesive capsulitis 12/27/2016   Resolved Ambulatory Problems    Diagnosis Date Noted  . Acute bronchitis 01/08/2015   Past Medical History:  Diagnosis Date  . Hypertension   . Lung cancer ([place])   . Thyroid disease       Review of Systems  Constitutional: Negative for chills and fever.  Respiratory: Negative for cough and shortness of breath.   Cardiovascular: Positive for chest pain. Negative for palpitations.  Gastrointestinal: Positive for abdominal pain and nausea. Negative for blood in stool, constipation, diarrhea and vomiting.  Musculoskeletal: Positive for back pain ( radiated).        Objective:   Physical Exam  Constitutional: He is oriented to person, place, and time. He appears well-developed and well-nourished. No distress.  HENT:  Head: Normocephalic and atraumatic.  Eyes: Conjunctivae are normal.  Neck: Normal range of motion.  Cardiovascular: Normal rate, regular rhythm, normal heart sounds and intact distal pulses.  Pulmonary/Chest: Effort normal and breath sounds normal. No respiratory distress. He has no wheezes.  Abdominal: Soft. Normal appearance. There is no hepatosplenomegaly. There is tenderness (epigastric > LUQ) in the epigastric area and left upper quadrant. There is no guarding, no CVA tenderness and negative Murphy's sign.  Musculoskeletal: Normal range of motion.  Neurological: He is alert and oriented to person, place, and time.  Skin: Skin is warm and dry.  Psychiatric: He has a normal mood and affect. His behavior is normal.  Nursing note and vitals reviewed.         Assessment & Plan:  Walter Wilkerson KitchenMarland KitchenDiagnoses and all orders for this visit:  PUD (peptic ulcer disease) -     omeprazole (PRILOSEC) 40 MG capsule; Take 1 capsule (40 mg total) by mouth daily. -     sucralfate (CARAFATE) 1 g tablet; Take 1 tablet (1 g total) by mouth 4 (four) times daily. -     H. pylori breath test -     COMPLETE METABOLIC PANEL WITH GFR -     Lipase -     CBC  Epigastric pain  Nausea   Suspect duodenum ulcer. H.pylori breath test  done today. Given GI cocktail in clinic.  Start carafate and omeprazole today. STOP mobic which could be causing ulcer. Will get cbc, cmp, lipase to rule out pancreatitis, hemoglobin.  Follow up in 2-3 weeks. If not improving will consider endoscopy.

## 2017-04-20 NOTE — Patient Instructions (Signed)
Start carafate 1g 4 times a day.  Start omeprazole once a day.    Peptic Ulcer A peptic ulcer is a painful sore in the lining of your esophagus, stomach, or the first part of your small intestine. You may have pain in the area between your chest and your belly button. The most common causes of an ulcer are:  An infection.  Using certain pain medicines too often or too much.  Follow these instructions at home:  Avoid alcohol.  Avoid caffeine.  Do not use any tobacco products. These include cigarettes, chewing tobacco, and e-cigarettes. If you need help quitting, ask your doctor.  Take over-the-counter and prescription medicines only as told by your doctor. Do not stop or change your medicines unless you talk with your doctor about it first.  Keep all follow-up visits as told by your doctor. This is important. Contact a doctor if:  You do not get better in 7 days after you start treatment.  You keep having an upset stomach (indigestion) or heartburn. Get help right away if:  You have sudden, sharp pain in your belly (abdomen).  You have lasting belly pain.  You have bloody poop (stool) or black, tarry poop.  You throw up (vomit) blood. It may look like coffee grounds.  You feel light-headed or feel like you may pass out (faint).  You get weak.  You get sweaty or feel sticky and cold to the touch (clammy). This information is not intended to replace advice given to you by your health care provider. Make sure you discuss any questions you have with your health care provider. Document Released: 03/23/2009 Document Revised: 05/13/2015 Document Reviewed: 09/27/2014 Elsevier Interactive Patient Education  [employer].

## 2017-04-21 LAB — COMPLETE METABOLIC PANEL WITH GFR
AG Ratio: 1.4 (calc) (ref 1.0–2.5)
ALKALINE PHOSPHATASE (APISO): 46 U/L (ref 40–115)
ALT: 8 U/L — ABNORMAL LOW (ref 9–46)
AST: 10 U/L (ref 10–35)
Albumin: 3.8 g/dL (ref 3.6–5.1)
BILIRUBIN TOTAL: 0.4 mg/dL (ref 0.2–1.2)
BUN / CREAT RATIO: 15 (calc) (ref 6–22)
BUN: 20 mg/dL (ref 7–25)
CO2: 28 mmol/L (ref 20–32)
CREATININE: 1.37 mg/dL — AB (ref 0.70–1.25)
Calcium: 9 mg/dL (ref 8.6–10.3)
Chloride: 105 mmol/L (ref 98–110)
GFR, EST AFRICAN AMERICAN: 61 mL/min/{1.73_m2} (ref >=60)
GFR, Est Non African American: 53 mL/min/{1.73_m2} — ABNORMAL LOW (ref >=60)
GLOBULIN: 2.8 g/dL (ref 1.9–3.7)
Glucose, Bld: 101 mg/dL — ABNORMAL HIGH (ref 65–99)
Potassium: 4.9 mmol/L (ref 3.5–5.3)
SODIUM: 138 mmol/L (ref 135–146)
TOTAL PROTEIN: 6.6 g/dL (ref 6.1–8.1)

## 2017-04-21 LAB — CBC
HCT: 29.9 % — ABNORMAL LOW (ref 38.5–50.0)
Hemoglobin: 9.7 g/dL — ABNORMAL LOW (ref 13.2–17.1)
MCH: 29.3 pg (ref 27.0–33.0)
MCHC: 32.4 g/dL (ref 32.0–36.0)
MCV: 90.3 fL (ref 80.0–100.0)
MPV: 11 fL (ref 7.5–12.5)
Platelets: 254 10*3/uL (ref 140–400)
RBC: 3.31 10*6/uL — ABNORMAL LOW (ref 4.20–5.80)
RDW: 13.5 % (ref 11.0–15.0)
WBC: 5.8 10*3/uL (ref 3.8–10.8)

## 2017-04-21 LAB — LIPASE: LIPASE: 39 U/L (ref 7–60)

## 2017-04-21 LAB — H. PYLORI BREATH TEST: H. pylori Breath Test: NOT DETECTED

## 2017-04-21 NOTE — Progress Notes (Signed)
H.pylori is negative. Plan does not change still stay on carafate and omeprazole for 4 weeks then we can follow up and likely drop off carafate and then consider coming off omeprazole. This is presenting like a duodenal ulcer.

## 2017-04-21 NOTE — Progress Notes (Signed)
Call pt: no pancreatitis. Kidney function has bounced back up. If taking mobic daily likely due to that. Looks like you are going to need to stay off mobic. How do you feel today? Still waiting on h.pylori results.

## 2017-04-25 ENCOUNTER — Other Ambulatory Visit: Payer: Self-pay | Admitting: Physician Assistant

## 2017-04-25 DIAGNOSIS — M79643 Pain in unspecified hand: Secondary | ICD-10-CM

## 2017-05-10 ENCOUNTER — Other Ambulatory Visit: Payer: Self-pay | Admitting: Physician Assistant

## 2017-05-10 ENCOUNTER — Other Ambulatory Visit: Payer: Self-pay | Admitting: *Deleted

## 2017-05-10 MED ORDER — HYDROCODONE-ACETAMINOPHEN 10-325 MG PO TABS: 1.0000 | ORAL_TABLET | Freq: Four times a day (QID) | ORAL | 0 refills | Status: DC | PRN

## 2017-06-12 ENCOUNTER — Other Ambulatory Visit: Payer: Self-pay | Admitting: *Deleted

## 2017-06-12 ENCOUNTER — Other Ambulatory Visit: Payer: Self-pay | Admitting: Physician Assistant

## 2017-06-12 MED ORDER — HYDROCODONE-ACETAMINOPHEN 10-325 MG PO TABS: 1.0000 | ORAL_TABLET | Freq: Four times a day (QID) | ORAL | 0 refills | Status: DC | PRN

## 2017-06-12 NOTE — Telephone Encounter (Signed)
[facility] requesting RF on pt's Lipitor 20mg  tabs.   Med list states this was last written 06-20-16 by historical provider.   OK to RF this under you?  Please advise

## 2017-07-17 ENCOUNTER — Telehealth: Payer: Self-pay

## 2017-07-17 DIAGNOSIS — R7989 Other specified abnormal findings of blood chemistry: Secondary | ICD-10-CM

## 2017-07-17 DIAGNOSIS — R710 Precipitous drop in hematocrit: Secondary | ICD-10-CM

## 2017-07-17 NOTE — Telephone Encounter (Signed)
Walter Wilkerson called for a refill of hydrocodone and he would like to have lab work.

## 2017-07-18 MED ORDER — HYDROCODONE-ACETAMINOPHEN 10-325 MG PO TABS: 1.0000 | ORAL_TABLET | Freq: Four times a day (QID) | ORAL | 0 refills | Status: DC | PRN

## 2017-07-18 NOTE — Telephone Encounter (Signed)
Sent hydrocodone. Will fax labs down to be drawn.

## 2017-07-19 NOTE — Telephone Encounter (Signed)
Patient advised.

## 2017-07-21 ENCOUNTER — Ambulatory Visit (INDEPENDENT_AMBULATORY_CARE_PROVIDER_SITE_OTHER): Payer: Medicare Other

## 2017-07-21 ENCOUNTER — Encounter: Payer: Self-pay | Admitting: Physician Assistant

## 2017-07-21 ENCOUNTER — Ambulatory Visit (INDEPENDENT_AMBULATORY_CARE_PROVIDER_SITE_OTHER): Payer: Medicare Other | Admitting: Physician Assistant

## 2017-07-21 VITALS — BP 156/76 | HR 100 | Ht 67.99 in | Wt 174.0 lb

## 2017-07-21 DIAGNOSIS — R0781 Pleurodynia: Secondary | ICD-10-CM

## 2017-07-21 DIAGNOSIS — Z1159 Encounter for screening for other viral diseases: Secondary | ICD-10-CM

## 2017-07-21 MED ORDER — DICLOFENAC SODIUM 1 % TD GEL: 4.0000 g | Freq: Four times a day (QID) | TRANSDERMAL | 1 refills | Status: DC

## 2017-07-21 NOTE — Progress Notes (Deleted)
No vitamin d calcium

## 2017-07-21 NOTE — Progress Notes (Signed)
1 

## 2017-07-21 NOTE — Progress Notes (Signed)
Call pt: NO rib fracture! Great news. Likely musculoskeletal or inflammation of cartilage in between ribs. Voltaren gel, ice and rest and time!

## 2017-07-21 NOTE — Progress Notes (Signed)
Subjective:    Patient ID: Walter Wilkerson, male    DOB: 06-Nov-1950, 67 y.o.   MRN: [id number]  HPI Pt is a 67 yr old male with a PMHx of PUD, and Non Small Cell Lung CA who presents to the clinic complaining of 2 day hx of left sided rib pain. Pt states he was sitting at a table when he heard a pop after he turned to his left side to pick up his laptop. Pt has been experiencing intermittent left sided rib pain since that episode. Pt says the pain is worse w/ inspiration and with turning. He denies trying anything OTC. Pt does report experiencing post traumatic rib fracture on his left side 20 years ago. Pt denies SOB, cough, fever, chills, or cp.  .. Active Ambulatory Problems    Diagnosis Date Noted  . Acquired hypothyroidism 08/11/2014  . Essential hypertension 08/11/2014  . Hyperlipidemia 08/11/2014  . Peripheral neuropathy ([place]) 08/11/2014  . Non-small cell lung cancer ([place]) 08/11/2014  . Shoulder impingement 02/24/2015  . Hyperglycemia 04/14/2015  . Prediabetes 04/15/2015  . Dermatitis 10/18/2015  . Cyst of pharynx 01/12/2016  . Aortic atherosclerosis ([place]) 01/12/2016  . Elevated serum creatinine 02/10/2016  . Fatigue 11/04/2016  . Stress at work 11/04/2016  . Adhesive capsulitis 12/27/2016  . Nausea 04/20/2017  . Epigastric pain 04/20/2017  . PUD (peptic ulcer disease) 04/20/2017   Resolved Ambulatory Problems    Diagnosis Date Noted  . Acute bronchitis 01/08/2015   Past Medical History:  Diagnosis Date  . Hypertension   . Lung cancer ([place])   . Thyroid disease      Review of Systems  Constitutional: Negative for chills, diaphoresis, fatigue and fever.  HENT: Negative.   Eyes: Negative.   Respiratory: Negative for cough, chest tightness, shortness of breath and wheezing.   Cardiovascular: Negative for chest pain and palpitations.  Gastrointestinal: Negative.   Genitourinary: Negative.   Musculoskeletal: Negative for back pain and neck pain.       + left sided  rip pain   Neurological: Negative for weakness and numbness.  Hematological: Does not bruise/bleed easily.  All other systems reviewed and are negative.      Objective:   Physical Exam  Constitutional: He appears well-developed and well-nourished.  HENT:  Head: Normocephalic and atraumatic.  Eyes: Pupils are equal, round, and reactive to light. Conjunctivae and EOM are normal.  Neck: Normal range of motion.  Cardiovascular: Normal rate, regular rhythm and normal heart sounds. Exam reveals no gallop and no friction rub.  No murmur heard. Pulmonary/Chest: Effort normal and breath sounds normal. He has no wheezes. He has no rales. He exhibits no tenderness.  Musculoskeletal: He exhibits tenderness (Left sided rip pain tender to palpation and w/ deep inspiration). He exhibits no edema or deformity.  Neurological: He is alert.  Skin: Skin is warm and dry. No rash noted.    Assessment & Plan:   Jameer was seen today for believes he cracked a rib.  Diagnoses and all orders for this visit:  Rib pain on left side -     DG Ribs Unilateral W/Chest Left -     diclofenac sodium (VOLTAREN) 1 % GEL; Apply 4 g topically 4 (four) times daily.  Need for hepatitis C screening test -     Hepatitis C Antibody  CXR to rule out rib fracture. CXR negative for fracture. Likely muscle strain vs costochondritis. No NSAIDs due to GERD/elevated serum creatine. Given voltaren gel he can use  up tot 4 times a day. Pt on pain contract.   Encouraged icing and ace wrap if feels like he needs more support.  Reassured of vitals. Keep deep breathing as not to develop pneumonia.  He was told to come back to the clinic if he experiences cough, fever, SOB or worsening pain.

## 2017-07-22 LAB — CBC WITH DIFFERENTIAL/PLATELET
BASOS PCT: 1.3 %
Basophils Absolute: 60 cells/uL (ref 0–200)
EOS ABS: 170 {cells}/uL (ref 15–500)
Eosinophils Relative: 3.7 %
HEMATOCRIT: 36.3 % — AB (ref 38.5–50.0)
Hemoglobin: 11.7 g/dL — ABNORMAL LOW (ref 13.2–17.1)
Lymphs Abs: 943 cells/uL (ref 850–3900)
MCH: 28.2 pg (ref 27.0–33.0)
MCHC: 32.2 g/dL (ref 32.0–36.0)
MCV: 87.5 fL (ref 80.0–100.0)
MPV: 11.3 fL (ref 7.5–12.5)
Monocytes Relative: 11.4 %
Neutro Abs: 2903 cells/uL (ref 1500–7800)
Neutrophils Relative %: 63.1 %
PLATELETS: 220 10*3/uL (ref 140–400)
RBC: 4.15 10*6/uL — ABNORMAL LOW (ref 4.20–5.80)
RDW: 18.2 % — ABNORMAL HIGH (ref 11.0–15.0)
TOTAL LYMPHOCYTE: 20.5 %
WBC: 4.6 10*3/uL (ref 3.8–10.8)
WBCMIX: 524 {cells}/uL (ref 200–950)

## 2017-07-22 LAB — BASIC METABOLIC PANEL
BUN: 18 mg/dL (ref 7–25)
CHLORIDE: 105 mmol/L (ref 98–110)
CO2: 26 mmol/L (ref 20–32)
Calcium: 9.6 mg/dL (ref 8.6–10.3)
Creat: 1.03 mg/dL (ref 0.70–1.25)
Glucose, Bld: 100 mg/dL — ABNORMAL HIGH (ref 65–99)
POTASSIUM: 4.5 mmol/L (ref 3.5–5.3)
Sodium: 139 mmol/L (ref 135–146)

## 2017-07-22 LAB — HEPATITIS C ANTIBODY
HEP C AB: NONREACTIVE
SIGNAL TO CUT-OFF: 0.02 (ref <1.00)

## 2017-07-24 NOTE — Telephone Encounter (Signed)
Call pt: hgb improved a lot. Are you taking any oral iron?  Kidney function much better. Normal range now.

## 2017-08-18 ENCOUNTER — Telehealth: Payer: Self-pay

## 2017-08-18 MED ORDER — HYDROCODONE-ACETAMINOPHEN 10-325 MG PO TABS: 1.0000 | ORAL_TABLET | Freq: Four times a day (QID) | ORAL | 0 refills | Status: DC | PRN

## 2017-08-18 NOTE — Telephone Encounter (Signed)
Scription sent electronically.  He is way overdue for a chronic pain management follow-up.  He needs to schedule that appointment before the next refill is due in September.

## 2017-08-18 NOTE — Telephone Encounter (Signed)
Pt requesting RF on his Norco.   Last RX sent on 07-18-17  Please review and send if appropriate.  Thanks!

## 2017-08-21 NOTE — Telephone Encounter (Signed)
Called and left pt VM advising him RX was sent in and that he must have OV prior to next refill

## 2017-09-04 ENCOUNTER — Encounter: Payer: Self-pay | Admitting: Physician Assistant

## 2017-09-04 ENCOUNTER — Ambulatory Visit (INDEPENDENT_AMBULATORY_CARE_PROVIDER_SITE_OTHER): Payer: Medicare Other | Admitting: Physician Assistant

## 2017-09-04 VITALS — BP 150/81 | HR 71 | Ht 67.99 in | Wt 175.0 lb

## 2017-09-04 DIAGNOSIS — G629 Polyneuropathy, unspecified: Secondary | ICD-10-CM

## 2017-09-04 DIAGNOSIS — I10 Essential (primary) hypertension: Secondary | ICD-10-CM

## 2017-09-04 DIAGNOSIS — G894 Chronic pain syndrome: Secondary | ICD-10-CM

## 2017-09-04 MED ORDER — HYDROCODONE-ACETAMINOPHEN 10-325 MG PO TABS: 1.0000 | ORAL_TABLET | Freq: Three times a day (TID) | ORAL | 0 refills | Status: DC | PRN

## 2017-09-04 MED ORDER — HYDROCODONE-ACETAMINOPHEN 10-325 MG PO TABS: 1.0000 | ORAL_TABLET | Freq: Four times a day (QID) | ORAL | 0 refills | Status: DC | PRN

## 2017-09-04 NOTE — Progress Notes (Signed)
   Subjective:    Patient ID: Walter Wilkerson, male    DOB: 05/22/1950, 67 y.o.   MRN: [id number]  HPI  Pt is a 67 yo male with hx of lung cancer and resection, HTN, hypothyroidism, HLD, peipheral neuropathy who presents to the clinic for medication refill.   Pt has chronic pain and on chronic. Takes norco 3 times a day. No problems or concerns. He is exercising by walking 3 to 4 times a week. norco makes possible to do basic life functions.   Pt denies any CP, palpitations, headaches, or vision changes. Pt taking 1/2 tablet of Diovian.   .. Active Ambulatory Problems    Diagnosis Date Noted  . Acquired hypothyroidism 08/11/2014  . Essential hypertension 08/11/2014  . Hyperlipidemia 08/11/2014  . Peripheral neuropathy ([place]) 08/11/2014  . Non-small cell lung cancer ([place]) 08/11/2014  . Shoulder impingement 02/24/2015  . Hyperglycemia 04/14/2015  . Prediabetes 04/15/2015  . Dermatitis 10/18/2015  . Cyst of pharynx 01/12/2016  . Aortic atherosclerosis ([place]) 01/12/2016  . Elevated serum creatinine 02/10/2016  . Fatigue 11/04/2016  . Stress at work 11/04/2016  . Adhesive capsulitis 12/27/2016  . Nausea 04/20/2017  . Epigastric pain 04/20/2017  . PUD (peptic ulcer disease) 04/20/2017   Resolved Ambulatory Problems    Diagnosis Date Noted  . Acute bronchitis 01/08/2015   Past Medical History:  Diagnosis Date  . Hypertension   . Lung cancer ([place])   . Thyroid disease       Review of Systems  All other systems reviewed and are negative.      Objective:   Physical Exam  Constitutional: He is oriented to person, place, and time. He appears well-developed and well-nourished.  HENT:  Head: Normocephalic and atraumatic.  Cardiovascular: Normal rate and regular rhythm.  Pulmonary/Chest: Effort normal.  Neurological: He is alert and oriented to person, place, and time.  Psychiatric: He has a normal mood and affect. His behavior is normal.          Assessment & Plan:   [name] KitchenDiagnoses and all orders for this visit:  Essential hypertension  Peripheral polyneuropathy -     HYDROcodone-acetaminophen (NORCO) 10-325 MG tablet; Take 1 tablet by mouth every 6 (six) hours as needed. -     HYDROcodone-acetaminophen (NORCO) 10-325 MG tablet; Take 1 tablet by mouth every 8 (eight) hours as needed. -     HYDROcodone-acetaminophen (NORCO) 10-325 MG tablet; Take 1 tablet by mouth every 8 (eight) hours as needed.  Chronic pain syndrome -     HYDROcodone-acetaminophen (NORCO) 10-325 MG tablet; Take 1 tablet by mouth every 6 (six) hours as needed. -     HYDROcodone-acetaminophen (NORCO) 10-325 MG tablet; Take 1 tablet by mouth every 8 (eight) hours as needed. -     HYDROcodone-acetaminophen (NORCO) 10-325 MG tablet; Take 1 tablet by mouth every 8 (eight) hours as needed.     Refilled norco for 3 months. Pain contract 10/2016 up to date. UDS up to date.  [place] controlled substance database reviewed with no concerns.   BP borderline. Increase to full tablet of diovan. Continue to monitor BP. Follow up in 3 months.

## 2017-09-05 MED ORDER — ATORVASTATIN CALCIUM 40 MG PO TABS: 40.0000 mg | ORAL_TABLET | Freq: Every day | ORAL | 0 refills | Status: DC

## 2017-10-19 ENCOUNTER — Other Ambulatory Visit: Payer: Self-pay | Admitting: Physician Assistant

## 2017-10-19 DIAGNOSIS — I10 Essential (primary) hypertension: Secondary | ICD-10-CM

## 2017-11-06 ENCOUNTER — Other Ambulatory Visit: Payer: Self-pay | Admitting: Physician Assistant

## 2017-11-15 NOTE — Progress Notes (Deleted)
Subjective:   Walter Wilkerson is a 67 y.o. male who presents for Medicare Annual/Subsequent preventive examination.  Review of Systems:  No ROS.  Medicare Wellness Visit. Additional risk factors are reflected in the social history.        Sleep patterns:    Home Safety/Smoke Alarms: Feels safe in home. Smoke alarms in place.  Living environment;      Male:   CCS-     PSA-  Lab Results  Component Value Date   PSA 0.74 04/13/2015     Objective:    Vitals: There were no vitals taken for this visit.  There is no height or weight on file to calculate BMI.  Advanced Directives 12/30/2016 08/11/2014  Does Patient Have a Medical Advance Directive? Yes Yes  Type of Advance Directive Living will Living will  Does patient want to make changes to medical advance directive? - No - Patient declined    Tobacco Social History   Tobacco Use  Smoking Status Former Smoker  . Last attempt to quit: 08/10/2004  . Years since quitting: 13.2  Smokeless Tobacco Never Used     Counseling given: Not Answered   Clinical Intake:                       Past Medical History:  Diagnosis Date  . Hypertension   . Lung cancer ([place])   . Thyroid disease    Past Surgical History:  Procedure Laterality Date  . LUNG LOBECTOMY Right 2008   RLL resection  . VASECTOMY     Family History  Problem Relation Age of Onset  . Heart attack Father   . Hyperlipidemia Father   . Hypertension Father   . Diabetes Unknown        grandmother    Social History   Socioeconomic History  . Marital status: Married    Spouse name: Not on file  . Number of children: Not on file  . Years of education: Not on file  . Highest education level: Not on file  Occupational History  . Not on file  Social Needs  . Financial resource strain: Not on file  . Food insecurity:    Worry: Not on file    Inability: Not on file  . Transportation needs:    Medical: Not on file    Non-medical: Not on  file  Tobacco Use  . Smoking status: Former Smoker    Last attempt to quit: 08/10/2004    Years since quitting: 13.2  . Smokeless tobacco: Never Used  Substance and Sexual Activity  . Alcohol use: Yes    Alcohol/week: 5.0 standard drinks    Types: 5 Standard drinks or equivalent per week  . Drug use: No  . Sexual activity: Yes    Partners: Female  Lifestyle  . Physical activity:    Days per week: Not on file    Minutes per session: Not on file  . Stress: Not on file  Relationships  . Social connections:    Talks on phone: Not on file    Gets together: Not on file    Attends religious service: Not on file    Active member of club or organization: Not on file    Attends meetings of clubs or organizations: Not on file    Relationship status: Not on file  Other Topics Concern  . Not on file  Social History Narrative  . Not on file    Outpatient  Encounter Medications as of 11/27/2017  Medication Sig  . atorvastatin (LIPITOR) 40 MG tablet Take 1 tablet (40 mg total) by mouth daily.  . diclofenac sodium (VOLTAREN) 1 % GEL Apply 4 g topically 4 (four) times daily.  [name] Kitchen gabapentin (NEURONTIN) 100 MG capsule TAKE ONE CAPSULE BY MOUTH 3 TIMES A DAY  . HYDROcodone-acetaminophen (NORCO) 10-325 MG tablet Take 1 tablet by mouth every 6 (six) hours as needed.  [name] Kitchen HYDROcodone-acetaminophen (NORCO) 10-325 MG tablet Take 1 tablet by mouth every 8 (eight) hours as needed.  [name] Kitchen HYDROcodone-acetaminophen (NORCO) 10-325 MG tablet Take 1 tablet by mouth every 8 (eight) hours as needed.  [name] Kitchen levothyroxine (SYNTHROID, LEVOTHROID) 150 MCG tablet Take 1 tablet (150 mcg total) by mouth daily before breakfast. NEEDS FOLLOW UP APPOINTMENT  . Multiple Vitamin (MULTIVITAMIN) tablet Take 1 tablet by mouth daily.  . valsartan-hydrochlorothiazide (DIOVAN-HCT) 160-25 MG tablet TAKE ONE TABLET BY MOUTH EVERY DAY   No facility-administered encounter medications on file as of 11/27/2017.     Activities of Daily  Living No flowsheet data found.  Patient Care Team: Lavada Mesi as PCP - General (Family Medicine)   Assessment:   This is a routine wellness examination for Walter Wilkerson.Physical assessment deferred to PCP.   Exercise Activities and Dietary recommendations   Diet  Breakfast: Lunch:  Dinner:       Goals   None     Fall Risk Fall Risk  09/02/2016  Falls in the past year? No   Is the patient's home free of loose throw rugs in walkways, pet beds, electrical cords, etc?   {Blank single:19197::"yes","no"}      Grab bars in the bathroom? {Blank single:19197::"yes","no"}      Handrails on the stairs?   {Blank single:19197::"yes","no"}      Adequate lighting?   {Blank single:19197::"yes","no"}   Depression Screen PHQ 2/9 Scores 04/20/2017 11/04/2016 09/02/2016 05/27/2016  PHQ - 2 Score 0 0 0 0    Cognitive Function        Immunization History  Administered Date(s) Administered  . Influenza,inj,Quad PF,6+ Mos 01/09/2013, 11/11/2014, 10/16/2015, 11/04/2016  . Pneumococcal Conjugate-13 04/13/2015  . Pneumococcal Polysaccharide-23 11/04/2016  . Tdap 09/22/2010     Screening Tests Health Maintenance  Topic Date Due  . INFLUENZA VACCINE  04/25/2018 (Originally 08/10/2017)  . COLONOSCOPY  01/11/2020  . TETANUS/TDAP  09/21/2020  . Hepatitis C Screening  Completed  . PNA vac Low Risk Adult  Completed        Plan:   ***  I have personally reviewed and noted the following in the patient's chart:   . Medical and social history . Use of alcohol, tobacco or illicit drugs  . Current medications and supplements . Functional ability and status . Nutritional status . Physical activity . Advanced directives . List of other physicians . Hospitalizations, surgeries, and ER visits in previous 12 months . Vitals . Screenings to include cognitive, depression, and falls . Referrals and appointments  In addition, I have reviewed and discussed with patient certain  preventive protocols, quality metrics, and best practice recommendations. A written personalized care plan for preventive services as well as general preventive health recommendations were provided to patient.     Joanne Chars, LPN  33/08/2503

## 2017-11-27 ENCOUNTER — Ambulatory Visit: Payer: Medicare Other

## 2017-12-04 ENCOUNTER — Ambulatory Visit (INDEPENDENT_AMBULATORY_CARE_PROVIDER_SITE_OTHER): Payer: Medicare Other | Admitting: Physician Assistant

## 2017-12-04 ENCOUNTER — Encounter: Payer: Self-pay | Admitting: Physician Assistant

## 2017-12-04 VITALS — BP 144/75 | HR 79 | Temp 98.4°F | Wt 176.3 lb

## 2017-12-04 DIAGNOSIS — E039 Hypothyroidism, unspecified: Secondary | ICD-10-CM | POA: Diagnosis not present

## 2017-12-04 DIAGNOSIS — I10 Essential (primary) hypertension: Secondary | ICD-10-CM

## 2017-12-04 DIAGNOSIS — G629 Polyneuropathy, unspecified: Secondary | ICD-10-CM

## 2017-12-04 DIAGNOSIS — Z131 Encounter for screening for diabetes mellitus: Secondary | ICD-10-CM

## 2017-12-04 DIAGNOSIS — I700 Atherosclerosis of aorta: Secondary | ICD-10-CM | POA: Diagnosis not present

## 2017-12-04 DIAGNOSIS — G894 Chronic pain syndrome: Secondary | ICD-10-CM

## 2017-12-04 MED ORDER — HYDROCODONE-ACETAMINOPHEN 10-325 MG PO TABS: 1.0000 | ORAL_TABLET | Freq: Three times a day (TID) | ORAL | 0 refills | Status: DC | PRN

## 2017-12-04 MED ORDER — GABAPENTIN 100 MG PO CAPS: ORAL_CAPSULE | ORAL | 1 refills | Status: DC

## 2017-12-04 MED ORDER — HYDROCODONE-ACETAMINOPHEN 10-325 MG PO TABS: 1.0000 | ORAL_TABLET | Freq: Four times a day (QID) | ORAL | 0 refills | Status: DC | PRN

## 2017-12-04 NOTE — Progress Notes (Signed)
Subjective:    Patient ID: Walter Wilkerson, male    DOB: Jul 04, 1950, 67 y.o.   MRN: [id number]  HPI Pt is a 67 yo male with HTN, hypothyroidism, polyneuropathy, Chronic pain who presents to the clinic for 3 month follow up.   Pt is doing great.   HTN- denies any CP, palpitations, headaches, or vision changes.   Hypothyroidism- doing well. Taking medication daily. No problems or concerns.   Chronic pain- doing well. No problems with current dose.   He is having some increased problems with neuropathy more after exercising. He is walking 4 miles and notice his feet burn and tingle after. He is taking gabapentin 100mg  TID with some relief.   .. Active Ambulatory Problems    Diagnosis Date Noted  . Acquired hypothyroidism 08/11/2014  . Essential hypertension 08/11/2014  . Hyperlipidemia 08/11/2014  . Peripheral neuropathy ([place]) 08/11/2014  . Non-small cell lung cancer ([place]) 08/11/2014  . Shoulder impingement 02/24/2015  . Hyperglycemia 04/14/2015  . Prediabetes 04/15/2015  . Dermatitis 10/18/2015  . Cyst of pharynx 01/12/2016  . Aortic atherosclerosis ([place]) 01/12/2016  . Elevated serum creatinine 02/10/2016  . Fatigue 11/04/2016  . Stress at work 11/04/2016  . Adhesive capsulitis 12/27/2016  . Nausea 04/20/2017  . Epigastric pain 04/20/2017  . PUD (peptic ulcer disease) 04/20/2017   Resolved Ambulatory Problems    Diagnosis Date Noted  . Acute bronchitis 01/08/2015   Past Medical History:  Diagnosis Date  . Hypertension   . Lung cancer ([place])   . Thyroid disease       Review of Systems  All other systems reviewed and are negative.      Objective:   Physical Exam  Constitutional: He is oriented to person, place, and time. He appears well-developed and well-nourished.  HENT:  Head: Normocephalic and atraumatic.  Neck: No thyromegaly present.  Cardiovascular: Normal rate and regular rhythm.  Pulmonary/Chest: Effort normal and breath sounds normal.   Neurological: He is alert and oriented to person, place, and time.  Psychiatric: He has a normal mood and affect. His behavior is normal.          Assessment & Plan:  [name] KitchenModesto was seen today for medication management.  Diagnoses and all orders for this visit:  Aortic atherosclerosis ([place]) -     Lipid Panel w/reflex Direct LDL  Essential hypertension -     COMPLETE METABOLIC PANEL WITH GFR  Acquired hypothyroidism -     TSH  Peripheral polyneuropathy -     gabapentin (NEURONTIN) 100 MG capsule; Take 1-3 tablets as needed three times a day -     HYDROcodone-acetaminophen (NORCO) 10-325 MG tablet; Take 1 tablet by mouth every 6 (six) hours as needed. -     HYDROcodone-acetaminophen (NORCO) 10-325 MG tablet; Take 1 tablet by mouth every 8 (eight) hours as needed. -     HYDROcodone-acetaminophen (NORCO) 10-325 MG tablet; Take 1 tablet by mouth every 8 (eight) hours as needed.  Screening for diabetes mellitus -     Hemoglobin A1c  Chronic pain syndrome -     HYDROcodone-acetaminophen (NORCO) 10-325 MG tablet; Take 1 tablet by mouth every 6 (six) hours as needed. -     HYDROcodone-acetaminophen (NORCO) 10-325 MG tablet; Take 1 tablet by mouth every 8 (eight) hours as needed. -     HYDROcodone-acetaminophen (NORCO) 10-325 MG tablet; Take 1 tablet by mouth every 8 (eight) hours as needed.   New pain contract signed.  UDS ordered.  Refilled norco for  3 months.  [place] controlled substance database reviewed with no concerns.   Fasting labs ordered.

## 2017-12-05 ENCOUNTER — Ambulatory Visit: Payer: Medicare Other | Admitting: Physician Assistant

## 2017-12-05 LAB — LIPID PANEL W/REFLEX DIRECT LDL
CHOL/HDL RATIO: 1.6 (calc) (ref <5.0)
CHOLESTEROL: 159 mg/dL (ref <200)
HDL: 97 mg/dL (ref >40)
LDL CHOLESTEROL (CALC): 51 mg/dL
Non-HDL Cholesterol (Calc): 62 mg/dL (calc) (ref <130)
TRIGLYCERIDES: 38 mg/dL (ref <150)

## 2017-12-05 LAB — COMPLETE METABOLIC PANEL WITH GFR
AG RATIO: 1.3 (calc) (ref 1.0–2.5)
ALBUMIN MSPROF: 3.9 g/dL (ref 3.6–5.1)
ALKALINE PHOSPHATASE (APISO): 51 U/L (ref 40–115)
ALT: 13 U/L (ref 9–46)
AST: 17 U/L (ref 10–35)
BILIRUBIN TOTAL: 0.5 mg/dL (ref 0.2–1.2)
BUN: 17 mg/dL (ref 7–25)
CHLORIDE: 107 mmol/L (ref 98–110)
CO2: 23 mmol/L (ref 20–32)
Calcium: 9.2 mg/dL (ref 8.6–10.3)
Creat: 0.97 mg/dL (ref 0.70–1.25)
GFR, Est African American: 93 mL/min/{1.73_m2} (ref >=60)
GFR, Est Non African American: 80 mL/min/{1.73_m2} (ref >=60)
GLOBULIN: 3 g/dL (ref 1.9–3.7)
Glucose, Bld: 79 mg/dL (ref 65–99)
POTASSIUM: 3.9 mmol/L (ref 3.5–5.3)
SODIUM: 141 mmol/L (ref 135–146)
Total Protein: 6.9 g/dL (ref 6.1–8.1)

## 2017-12-05 LAB — HEMOGLOBIN A1C
HEMOGLOBIN A1C: 5.5 %{Hb} (ref <5.7)
MEAN PLASMA GLUCOSE: 111 (calc)
eAG (mmol/L): 6.2 (calc)

## 2017-12-05 LAB — TSH: TSH: 2.03 m[IU]/L (ref 0.40–4.50)

## 2017-12-05 MED ORDER — LEVOTHYROXINE SODIUM 150 MCG PO TABS: 150.0000 ug | ORAL_TABLET | Freq: Every day | ORAL | 4 refills | Status: DC

## 2017-12-05 MED ORDER — ATORVASTATIN CALCIUM 40 MG PO TABS: 40.0000 mg | ORAL_TABLET | Freq: Every day | ORAL | 4 refills | Status: DC

## 2017-12-05 NOTE — Progress Notes (Signed)
Call pt: cholesterol looks fantastic. Kidney, liver, glucose look great. TSH wonderful. A!C up just a tad from 1 year ago but still in normal range. Refilled lipitor and synthroid for 1 year.

## 2017-12-05 NOTE — Addendum Note (Signed)
Addended by: [name] on: 12/05/2017 07:29 AM   Modules accepted: Orders

## 2017-12-13 NOTE — Progress Notes (Signed)
Subjective:   Walter Wilkerson is a 67 y.o. male who presents for Medicare Annual/Subsequent preventive examination.  Review of Systems:  No ROS.  Medicare Wellness Visit. Additional risk factors are reflected in the social history.  Cardiac Risk Factors include: dyslipidemia;hypertension;advanced age (>56men, >56 women);male gender  Sleep patterns: Getting about 8-9 hours of sleep a night. Wakes up on occasion during the night to go to the bathroom.  Feels rested upon wakening. Home Safety/Smoke Alarms: Feels safe in home. Smoke alarms in place.  Living environment; Lives with wife in 2 story home. Stairs have hand rails in place. Shower is a walk in shower with grab bars in place. Seat Belt Safety/Bike Helmet: Wears seat belt.   Male:   CCS-  utd   PSA- deferred until next visit Lab Results  Component Value Date   PSA 0.74 04/13/2015        Objective:    Vitals: BP 140/82   Pulse 60   Ht 5\' 8"  (1.727 m)   Wt 178 lb (80.7 kg)   SpO2 99%   BMI 27.06 kg/m   Body mass index is 27.06 kg/m.  Advanced Directives 12/18/2017 12/30/2016 08/11/2014  Does Patient Have a Medical Advance Directive? Yes Yes Yes  Type of Paramedic of [place];Living will Living will Living will  Does patient want to make changes to medical advance directive? No - Patient declined - No - Patient declined  Copy of [place] in Chart? No - copy requested - -    Tobacco Social History   Tobacco Use  Smoking Status Former Smoker  . Last attempt to quit: 08/10/2004  . Years since quitting: 13.3  Smokeless Tobacco Never Used     Counseling given: Not Answered   Clinical Intake:  Pre-visit preparation completed: Yes  Pain : No/denies pain     Nutritional Risks: None Diabetes: No  How often do you need to have someone help you when you read instructions, pamphlets, or other written materials from your doctor or pharmacy?: 1 - Never What is the last  grade level you completed in school?: 12  Interpreter Needed?: No  Information entered by :: Orlie Dakin, LPN  Past Medical History:  Diagnosis Date  . Hypertension   . Lung cancer ([place])   . Thyroid disease    Past Surgical History:  Procedure Laterality Date  . LUNG LOBECTOMY Right 2008   RLL resection  . VASECTOMY     Family History  Problem Relation Age of Onset  . Heart attack Father   . Hyperlipidemia Father   . Hypertension Father   . Diabetes Unknown        grandmother    Social History   Socioeconomic History  . Marital status: Married    Spouse name: Walter Wilkerson  . Number of children: 1  . Years of education: 55  . Highest education level: 12th grade  Occupational History  . Occupation: Financial risk analyst    Comment: [employer]  Social Needs  . Financial resource strain: Not on file  . Food insecurity:    Worry: Never true    Inability: Never true  . Transportation needs:    Medical: No    Non-medical: No  Tobacco Use  . Smoking status: Former Smoker    Last attempt to quit: 08/10/2004    Years since quitting: 13.3  . Smokeless tobacco: Never Used  Substance and Sexual Activity  . Alcohol use: Yes  Alcohol/week: 5.0 standard drinks    Types: 5 Standard drinks or equivalent per week  . Drug use: No  . Sexual activity: Yes    Partners: Female  Lifestyle  . Physical activity:    Days per week: 3 days    Minutes per session: 90 min  . Stress: Only a little  Relationships  . Social connections:    Talks on phone: More than three times a week    Gets together: Twice a week    Attends religious service: 1 to 4 times per year    Active member of club or organization: No    Attends meetings of clubs or organizations: Never    Relationship status: Married  Other Topics Concern  . Not on file  Social History Narrative   Has son at [employer] who is a Youth worker, working still until son gets out of school. Exercises at gym 3 times a week walking and  weights. Drinks 3-4 coffee a day    Outpatient Encounter Medications as of 12/18/2017  Medication Sig  . atorvastatin (LIPITOR) 40 MG tablet Take 1 tablet (40 mg total) by mouth daily.  . diclofenac sodium (VOLTAREN) 1 % GEL Apply 4 g topically 4 (four) times daily.  [name] Kitchen gabapentin (NEURONTIN) 100 MG capsule Take 1-3 tablets as needed three times a day  . [START ON 01/03/2018] HYDROcodone-acetaminophen (NORCO) 10-325 MG tablet Take 1 tablet by mouth every 8 (eight) hours as needed.  [name] Kitchen levothyroxine (SYNTHROID, LEVOTHROID) 150 MCG tablet Take 1 tablet (150 mcg total) by mouth daily before breakfast.  . Multiple Vitamin (MULTIVITAMIN) tablet Take 1 tablet by mouth daily.  . valsartan-hydrochlorothiazide (DIOVAN-HCT) 160-25 MG tablet TAKE ONE TABLET BY MOUTH EVERY DAY  . [DISCONTINUED] HYDROcodone-acetaminophen (NORCO) 10-325 MG tablet Take 1 tablet by mouth every 6 (six) hours as needed. (Patient not taking: Reported on 12/18/2017)  . [DISCONTINUED] HYDROcodone-acetaminophen (NORCO) 10-325 MG tablet Take 1 tablet by mouth every 8 (eight) hours as needed.   No facility-administered encounter medications on file as of 12/18/2017.     Activities of Daily Living In your present state of health, do you have any difficulty performing the following activities: 12/18/2017  Hearing? Y  Comment has to have people repeat themselves from time to time  Vision? N  Difficulty concentrating or making decisions? N  Walking or climbing stairs? Y  Comment left knee pain off and on and also has neuropathy  Dressing or bathing? N  Doing errands, shopping? N  Preparing Food and eating ? N  Using the Toilet? N  In the past six months, have you accidently leaked urine? N  Do you have problems with loss of bowel control? N  Managing your Medications? N  Managing your Finances? N  Housekeeping or managing your Housekeeping? N  Some recent data might be hidden    Patient Care Team: Lavada Mesi as PCP  - General (Family Medicine)   Assessment:   This is a routine wellness examination for Walter Wilkerson.Physical assessment deferred to PCP.   Exercise Activities and Dietary recommendations Current Exercise Habits: Structured exercise class, Type of exercise: walking;strength training/weights, Time (Minutes): 60, Frequency (Times/Week): 3, Weekly Exercise (Minutes/Week): 180, Intensity: Moderate, Exercise limited by: None identified Diet Eats healthy diet with veggies and fruits and proteins. Breakfast: protein bar Lunch: soup or leftovers. Dinner:  Chicken and fish with vegetables Drinks 3 12 ounce bottle of water a day     Goals    . Exercise 3x  per week (30 min per time)     Continue to exercise as you are doing and drinking plenty of water daily. Your doing a great job       Fall Risk Fall Risk  12/18/2017 09/02/2016  Falls in the past year? 0 No   Is the patient's home free of loose throw rugs in walkways, pet beds, electrical cords, etc?   yes      Grab bars in the bathroom? yes      Handrails on the stairs?   yes      Adequate lighting?   yes   Depression Screen PHQ 2/9 Scores 12/18/2017 12/04/2017 04/20/2017 11/04/2016  PHQ - 2 Score 0 0 0 0  PHQ- 9 Score 1 0 - -    Cognitive Function     6CIT Screen 12/18/2017  What Year? 0 points  What month? 0 points  What time? 0 points  Count back from 20 0 points  Months in reverse 0 points  Repeat phrase 0 points  Total Score 0    Immunization History  Administered Date(s) Administered  . Influenza,inj,Quad PF,6+ Mos 01/09/2013, 11/11/2014, 10/16/2015, 11/04/2016  . Influenza-Unspecified 09/04/2017  . Pneumococcal Conjugate-13 04/13/2015  . Pneumococcal Polysaccharide-23 11/04/2016  . Tdap 09/22/2010    Screening Tests Health Maintenance  Topic Date Due  . COLONOSCOPY  01/11/2020  . TETANUS/TDAP  09/21/2020  . INFLUENZA VACCINE  Completed  . Hepatitis C Screening  Completed  . PNA vac Low Risk Adult  Completed         Plan:    Walter Wilkerson , Thank you for taking time to come for your Medicare Wellness Visit. I appreciate your ongoing commitment to your health goals. Please review the following plan we discussed and let me know if I can assist you in the future.   Please schedule your next medicare wellness visit with me in 1 yr. Bring a copy of your living will and/or healthcare power of attorney to your next office visit.   These are the goals we discussed: Goals    . Exercise 3x per week (30 min per time)     Continue to exercise as you are doing and drinking plenty of water daily. Your doing a great job       This is a list of the screening recommended for you and due dates:  Health Maintenance  Topic Date Due  . Colon Cancer Screening  01/11/2020  . Tetanus Vaccine  09/21/2020  . Flu Shot  Completed  .  Hepatitis C: One time screening is recommended by Center for Disease Control  (CDC) for  adults born from [birth year] through 1966.   Completed  . Pneumonia vaccines  Completed     I have personally reviewed and noted the following in the patient's chart:   . Medical and social history . Use of alcohol, tobacco or illicit drugs  . Current medications and supplements . Functional ability and status . Nutritional status . Physical activity . Advanced directives . List of other physicians . Hospitalizations, surgeries, and ER visits in previous 12 months . Vitals . Screenings to include cognitive, depression, and falls . Referrals and appointments  In addition, I have reviewed and discussed with patient certain preventive protocols, quality metrics, and best practice recommendations. A written personalized care plan for preventive services as well as general preventive health recommendations were provided to patient.     Joanne Chars, LPN  [id number]/07/[id number]

## 2017-12-18 ENCOUNTER — Ambulatory Visit (INDEPENDENT_AMBULATORY_CARE_PROVIDER_SITE_OTHER): Payer: Medicare Other | Admitting: *Deleted

## 2017-12-18 VITALS — BP 140/82 | HR 60 | Ht 68.0 in | Wt 178.0 lb

## 2017-12-18 DIAGNOSIS — Z0000 Encounter for general adult medical examination without abnormal findings: Secondary | ICD-10-CM | POA: Diagnosis not present

## 2017-12-18 NOTE — Patient Instructions (Signed)
Walter Wilkerson , Thank you for taking time to come for your Medicare Wellness Visit. I appreciate your ongoing commitment to your health goals. Please review the following plan we discussed and let me know if I can assist you in the future.   Please schedule your next medicare wellness visit with me in 1 yr. Bring a copy of your living will and/or healthcare power of attorney to your next office visit.  These are the goals we discussed: Goals    . Exercise 3x per week (30 min per time)     Continue to exercise as you are doing and drinking plenty of water daily. Your doing a great job

## 2018-01-15 ENCOUNTER — Ambulatory Visit (INDEPENDENT_AMBULATORY_CARE_PROVIDER_SITE_OTHER): Payer: Medicare Other

## 2018-01-15 ENCOUNTER — Encounter: Payer: Self-pay | Admitting: Physician Assistant

## 2018-01-15 ENCOUNTER — Ambulatory Visit (INDEPENDENT_AMBULATORY_CARE_PROVIDER_SITE_OTHER): Payer: Medicare Other | Admitting: Physician Assistant

## 2018-01-15 VITALS — BP 162/88 | HR 73 | Ht 68.0 in | Wt 177.0 lb

## 2018-01-15 DIAGNOSIS — L739 Follicular disorder, unspecified: Secondary | ICD-10-CM

## 2018-01-15 DIAGNOSIS — M84374A Stress fracture, right foot, initial encounter for fracture: Secondary | ICD-10-CM | POA: Diagnosis not present

## 2018-01-15 DIAGNOSIS — M19071 Primary osteoarthritis, right ankle and foot: Secondary | ICD-10-CM | POA: Diagnosis not present

## 2018-01-15 DIAGNOSIS — M79671 Pain in right foot: Secondary | ICD-10-CM

## 2018-01-15 MED ORDER — CEPHALEXIN 500 MG PO CAPS: 500.0000 mg | ORAL_CAPSULE | Freq: Two times a day (BID) | ORAL | 0 refills | Status: DC

## 2018-01-15 MED ORDER — BENZOYL PEROXIDE 5 % EX LIQD: Freq: Two times a day (BID) | CUTANEOUS | 12 refills | Status: DC

## 2018-01-15 NOTE — Progress Notes (Addendum)
Subjective:    Patient ID: Walter Wilkerson, male    DOB: [birth year], 68 y.o.   MRN: [id number]  HPI Pt is a 68 yo male with HTN, hx of lung cancer who presents to the clinic with tiny bumps on hair covered part of the scalp that are recurrent and painful. No changes with detergents. Noticed for the last few months. They seem to eventually go away on their own.   For the last 8 months he has had intermittent pain of his 5th metatarsal. He is exercising and walking 3-5 miles 5-6 days a week for the last year. No known injury. He walks through the pain.   .. Active Ambulatory Problems    Diagnosis Date Noted  . Acquired hypothyroidism 08/11/2014  . Essential hypertension 08/11/2014  . Hyperlipidemia 08/11/2014  . Peripheral neuropathy ([place]) 08/11/2014  . Non-small cell lung cancer ([place]) 08/11/2014  . Shoulder impingement 02/24/2015  . Hyperglycemia 04/14/2015  . Prediabetes 04/15/2015  . Dermatitis 10/18/2015  . Cyst of pharynx 01/12/2016  . Aortic atherosclerosis ([place]) 01/12/2016  . Elevated serum creatinine 02/10/2016  . Fatigue 11/04/2016  . Stress at work 11/04/2016  . Adhesive capsulitis 12/27/2016  . Nausea 04/20/2017  . Epigastric pain 04/20/2017  . PUD (peptic ulcer disease) 04/20/2017   Resolved Ambulatory Problems    Diagnosis Date Noted  . Acute bronchitis 01/08/2015   Past Medical History:  Diagnosis Date  . Hypertension   . Lung cancer ([place])   . Thyroid disease      Review of Systems See HPI.     Objective:   Physical Exam Vitals signs reviewed.  Constitutional:      Appearance: Normal appearance.  HENT:     Head: Normocephalic.     Comments: Tiny erythematous bumps of hair follicles on hair covered scalp. Some scaled over. Tender to touch. No purulent discharge.  Cardiovascular:     Rate and Rhythm: Normal rate and regular rhythm.  Musculoskeletal:     Comments: Right foot:  NROM.  No swelling or bruising.  Pain to palpation over the 5th  metatarsal.   Neurological:     General: No focal deficit present.     Mental Status: He is alert and oriented to person, place, and time.  Psychiatric:        Mood and Affect: Mood normal.        Behavior: Behavior normal.           Assessment & Plan:  [name] KitchenHyde was seen today for foot pain.  Diagnoses and all orders for this visit:  Right foot pain -     DG Foot Complete Right  Folliculitis -     benzoyl peroxide 5 % external liquid; Apply topically 2 (two) times daily. -     cephALEXin (KEFLEX) 500 MG capsule; Take 1 capsule (500 mg total) by mouth 2 (two) times daily.   Concern for stress fracture. Placed some relative exercise reduction at 50 percent. May still do all low impact exercises such as biking. Xray ordered. Discussed not always prevalent on xray.  Use voltaren gel.  Ice after exercise.  Wear good supportive shoes.   Appears like folliculitis. Treated with keflex and benzyol peroxide.   HO given.   BP still elevated on 2nd recheck. Pt will keep check on BP at gym and follow up in 2 months.   Addendum:  Hx of fracture noted. Placed in post op shoe. No exercise walking. Only cycling. Follow up in 4 weeks.

## 2018-01-15 NOTE — Patient Instructions (Addendum)
Folliculitis  Folliculitis is inflammation of the hair follicles. Folliculitis most commonly occurs on the scalp, thighs, legs, back, and buttocks. However, it can occur anywhere on the body. What are the causes? This condition may be caused by:  A bacterial infection (common).  A fungal infection.  A viral infection.  Coming into contact with certain chemicals, especially oils and tars.  Shaving or waxing.  Applying greasy ointments or creams to your skin often. Long-lasting folliculitis and folliculitis that keeps coming back can be caused by bacteria that live in the nostrils. What increases the risk? This condition is more likely to develop in people with:  A weakened immune system.  Diabetes.  Obesity. What are the signs or symptoms? Symptoms of this condition include:  Redness.  Soreness.  Swelling.  Itching.  Small white or yellow, pus-filled, itchy spots (pustules) that appear over a reddened area. If there is an infection that goes deep into the follicle, these may develop into a boil (furuncle).  A group of closely packed boils (carbuncle). These tend to form in hairy, sweaty areas of the body. How is this diagnosed? This condition is diagnosed with a skin exam. To find what is causing the condition, your health care provider may take a sample of one of the pustules or boils for testing. How is this treated? This condition may be treated by:  Applying warm compresses to the affected areas.  Taking an antibiotic medicine or applying an antibiotic medicine to the skin.  Applying or bathing with an antiseptic solution.  Taking an over-the-counter medicine to help with itching.  Having a procedure to drain any pustules or boils. This may be done if a pustule or boil contains a lot of pus or fluid.  Laser hair removal. This may be done to treat long-lasting folliculitis. Follow these instructions at home:  If directed, apply heat to the affected area as  often as told by your health care provider. Use the heat source that your health care provider recommends, such as a moist heat pack or a heating pad. ? Place a towel between your skin and the heat source. ? Leave the heat on for 20-30 minutes. ? Remove the heat if your skin turns bright red. This is especially important if you are unable to feel pain, heat, or cold. You may have a greater risk of getting burned.  If you were prescribed an antibiotic medicine, use it as told by your health care provider. Do not stop using the antibiotic even if you start to feel better.  Take over-the-counter and prescription medicines only as told by your health care provider.  Do not shave irritated skin.  Keep all follow-up visits as told by your health care provider. This is important. Get help right away if:  You have more redness, swelling, or pain in the affected area.  Red streaks are spreading from the affected area.  You have a fever. This information is not intended to replace advice given to you by your health care provider. Make sure you discuss any questions you have with your health care provider. Document Released: 03/07/2001 Document Revised: 07/17/2015 Document Reviewed: 10/17/2014 Elsevier Interactive Patient Education  2019 [employer]. Stress Fracture  A stress fracture is a small break or crack in a bone. A stress fracture can be fully broken (complete) or partially broken (incomplete). The most common sites for stress fractures are the bones in the front of your feet (metatarsals), your heel (calcaneus), and the long bone  of your lower leg (tibia). What are the causes? This condition is caused by overuse or repetitive exercise, such as running. It happens when a bone cannot absorb any more shock because the muscles around it are weak. Stress fractures happen most commonly when:  You rapidly increase or start a new physical activity.  You use shoes that are worn out or do not fit  properly.  You exercise on a new surface. What increases the risk? You are more likely to develop this condition if:  You have a condition that causes weak bones (osteoporosis).  You are male. Stress fractures are more likely to occur in women. What are the signs or symptoms? The most common symptom of a stress fracture is feeling pain when you are using or putting weight on the affected part of your body. The pain usually improves when you are resting. Other symptoms may include:  Swelling of the affected area.  Pain in the area when it is touched. Stress fracture pain usually develops over time. How is this diagnosed? This condition may be diagnosed by:  Your symptoms.  Your medical history.  A physical exam.  Imaging tests, such as: ? X-rays. ? MRI. ? Bone scan. How is this treated? Treatment depends on the severity of your stress fracture. It is commonly treated with resting, icing, compression, and elevation (RICE therapy). Treatment may also include:  Medicines to reduce inflammation.  A cast or a walking shoe.  Crutches.  Surgery. This is usually only in severe cases. Follow these instructions at home: If you have a cast:  Do not put pressure on any part of the cast until it is fully hardened. This may take several hours.  Do not stick anything inside the cast to scratch your skin. Doing that increases your risk of infection.  Check the skin around the cast every day. Tell your health care provider about any concerns.  You may put lotion on dry skin around the edges of the cast. Do not put lotion on the skin underneath the cast.  Keep the cast clean.  If the cast is not waterproof: ? Do not let it get wet. ? Cover it with a watertight covering when you take a bath or a shower. If you have a walking shoe:  Wear the shoe as told by your health care provider. Remove it only as told by your health care provider.  Loosen the shoe if your toes tingle,  become numb, or turn cold and blue.  Keep the shoe clean.  If the shoe is not waterproof: ? Do not let it get wet. Managing pain, stiffness, and swelling   If directed, apply ice to the injured area: ? If you have a walking shoe, remove the shoe as told by your health care provider. ? Put ice in a plastic bag. ? Place a towel between your skin and the bag or between your cast and the bag. ? Leave the ice on for 20 minutes, 2-3 times per day.  Move your toes often to avoid stiffness and to lessen swelling.  Raise (elevate) the injured area above the level of your heart while you are sitting or lying down. Activity  Rest as directed by your health care provider. Ask your health care provider if you may do alternative exercises, such as swimming or biking, while you are healing.  Return to your normal activities as directed by your health care provider. Ask your health care provider what activities are safe for  you.  Perform range-of-motion exercises only as directed by your health care provider. General instructions  Do not use the injured limb to support yourbody weight until your health care provider says that you can. Use crutches if your health care provider tells you to do so.  Do not use any products that contain nicotine or tobacco, such as cigarettes and e-cigarettes. These can delay bone healing. If you need help quitting, ask your health care provider.  Take over-the-counter and prescription medicines only as told by your health care provider.  Keep all follow-up visits as told by your health care provider. This is important. How is this prevented?  Only wear shoes that: ? Fit well. ? Are not worn out.  Eat a healthy diet that contains vitamin D and calcium. This helps keep your bones strong. Good sources of calcium and vitamin D include: ? Low-fat dairy products such as milk, yogurt, and cheese. ? Certain fish, such as fresh or canned salmon, tuna, and  sardines. ? Products that have calcium and vitamin D added to them (fortified products), such as fortified cereals or juice.  Be careful when you start a new physical activity. Give your body time to adjust.  Avoid doing only one kind of activity. Do different exercises, such as swimming and running, so that no single part of your body gets overused.  Do strength-training exercises. Contact a health care provider if:  Your pain gets worse.  You have new symptoms.  You have increased swelling. Get help right away if:  You lose feeling in the injured area. Summary  A stress fracture is a small break or crack in a bone. A stress fracture can be fully broken (complete) or partially broken (incomplete).  This condition is caused by overuse or repetitive exercise, such as running.  The most common symptom of a stress fracture is feeling pain when you are using or putting weight on the affected part of your body.  Treatment depends on the severity of your stress fracture. This information is not intended to replace advice given to you by your health care provider. Make sure you discuss any questions you have with your health care provider. Document Released: 03/19/2002 Document Revised: 02/07/2017 Document Reviewed: 02/07/2017 Elsevier Interactive Patient Education  2019 [employer].

## 2018-01-15 NOTE — Progress Notes (Signed)
You do have a non displaced fracture of the 5th fifth metatarals. Consulted with sports medicine. You need to come in a get fitted for a post op shoe. Need to wear for 4-6 weeks until pain free. You will unfortunatly need to stop walking and go to biking for 4-6 weeks while it heals.   Nurse visit for post op shoe.

## 2018-01-16 ENCOUNTER — Ambulatory Visit (INDEPENDENT_AMBULATORY_CARE_PROVIDER_SITE_OTHER): Payer: Medicare Other | Admitting: Physician Assistant

## 2018-01-16 DIAGNOSIS — M84374A Stress fracture, right foot, initial encounter for fracture: Secondary | ICD-10-CM

## 2018-01-16 NOTE — Progress Notes (Signed)
Post OP shoe placed on right foot.

## 2018-02-26 ENCOUNTER — Encounter: Payer: Self-pay | Admitting: Physician Assistant

## 2018-02-26 ENCOUNTER — Ambulatory Visit (INDEPENDENT_AMBULATORY_CARE_PROVIDER_SITE_OTHER): Payer: Medicare Other | Admitting: Physician Assistant

## 2018-02-26 VITALS — BP 174/79 | HR 62 | Ht 68.0 in | Wt 182.0 lb

## 2018-02-26 DIAGNOSIS — M19071 Primary osteoarthritis, right ankle and foot: Secondary | ICD-10-CM

## 2018-02-26 DIAGNOSIS — I10 Essential (primary) hypertension: Secondary | ICD-10-CM | POA: Diagnosis not present

## 2018-02-26 DIAGNOSIS — M84374A Stress fracture, right foot, initial encounter for fracture: Secondary | ICD-10-CM | POA: Diagnosis not present

## 2018-02-26 MED ORDER — VALSARTAN-HYDROCHLOROTHIAZIDE 320-25 MG PO TABS: 1.0000 | ORAL_TABLET | Freq: Every day | ORAL | 1 refills | Status: DC

## 2018-02-26 NOTE — Progress Notes (Signed)
Subjective:    Patient ID: Walter Wilkerson, male    DOB: 04-13-1950, 68 y.o.   MRN: [id number]  HPI  Pt is a 68 yo male who presents to the clinic to follow up on stress fracture of right foot. He has been wearing his post op boot for 5 weeks(01/16/2018). At first the boot took away all the pain in the last 2 weeks he has noticed more intermittent shooting pain in the 5th metatarsal and radiates up into calf. He admits to doing a lot of walking at work. He has stopped running.   He is on chronic pain medications. Will need refill soon. Would like for this to be his appt for refill.    .. Active Ambulatory Problems    Diagnosis Date Noted  . Acquired hypothyroidism 08/11/2014  . Essential hypertension 08/11/2014  . Hyperlipidemia 08/11/2014  . Peripheral neuropathy ([place]) 08/11/2014  . Non-small cell lung cancer ([place]) 08/11/2014  . Shoulder impingement 02/24/2015  . Hyperglycemia 04/14/2015  . Prediabetes 04/15/2015  . Dermatitis 10/18/2015  . Cyst of pharynx 01/12/2016  . Aortic atherosclerosis ([place]) 01/12/2016  . Elevated serum creatinine 02/10/2016  . Fatigue 11/04/2016  . Stress at work 11/04/2016  . Adhesive capsulitis 12/27/2016  . Nausea 04/20/2017  . Epigastric pain 04/20/2017  . PUD (peptic ulcer disease) 04/20/2017  . Metatarsal stress fracture of right foot 01/15/2018  . 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 Acute bronchitis 01/08/2015   Past Medical History:  Diagnosis Date  . Hypertension   . Lung cancer ([place])   . Thyroid disease        Review of Systems    see HPI.  Objective:   Physical Exam Vitals signs reviewed.  Constitutional:      Appearance: Normal appearance.  HENT:     Head: Normocephalic and atraumatic.  Cardiovascular:     Rate and Rhythm: Normal rate and regular rhythm.     Pulses: Normal pulses.  Pulmonary:     Effort: Pulmonary effort is normal.   Musculoskeletal:     Comments: Right foot:  No swelling, bruising, redness.  Tenderness over 5th metatarsophalangeal joint to palpation.  5th metatarsal does displace under 4th metatarsal.   Neurological:     General: No focal deficit present.     Mental Status: He is alert and oriented to person, place, and time.  Psychiatric:        Mood and Affect: Mood normal.        Behavior: Behavior normal.           Assessment & Plan:   [name] KitchenBoaz was seen today for follow-up.  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  reviewed xray. Lots of arthritis is great right 1st metatarsal and shows likely old fracture of the 5th metatarsal.   On exam today 5th metatarsal is displacing under 4th metatarsal. Theres is tenderness to palpation. Stay in boot. Ice regularly. Use voltaren gel as needed. unsure  Discussed with Dr. Georgina Snell. He would like to see him to manage. Will call patient to make appt.   BP elevated today. Increased diovan. Pt is asymptomatic. No CP, palpitations, headaches, vision changes.  Follow up  in 4 weeks.   Ok to refill norco when due. Pain contract up to date. UDS up to date.  Last fill was 02/19/18.  [name] KitchenPDMP reviewed during this encounter.

## 2018-03-02 ENCOUNTER — Telehealth: Payer: Self-pay | Admitting: Physician Assistant

## 2018-03-02 ENCOUNTER — Ambulatory Visit: Payer: Medicare Other | Admitting: Physician Assistant

## 2018-03-02 DIAGNOSIS — M19071 Primary osteoarthritis, right ankle and foot: Secondary | ICD-10-CM | POA: Insufficient documentation

## 2018-03-02 NOTE — Telephone Encounter (Signed)
Please call patient. Discussed right foot pain with Dr. Georgina Snell our sports medicine provider. He would like to see you. Stay in boot and make follow up with him.

## 2018-03-05 ENCOUNTER — Ambulatory Visit: Payer: Medicare Other | Admitting: Physician Assistant

## 2018-03-05 NOTE — Telephone Encounter (Signed)
At least 2 weeks pain free then as needed if going to be walking a lot. Wear really good supportive shoe always.

## 2018-03-05 NOTE — Telephone Encounter (Signed)
Called and advised pt. He declines to schedule with Dr Georgina Snell at this time because he states he is feeling MUCH better and he has not had any pain the past couple of days.  Pt states if he develops pain again, he will call and schedule with Dr Georgina Snell.   Pt wondering how much longer he needs to wear boot?

## 2018-03-05 NOTE — Telephone Encounter (Signed)
Pt advised. No further needs at this time

## 2018-03-21 ENCOUNTER — Other Ambulatory Visit: Payer: Self-pay | Admitting: Physician Assistant

## 2018-03-21 DIAGNOSIS — G894 Chronic pain syndrome: Secondary | ICD-10-CM

## 2018-03-21 DIAGNOSIS — G629 Polyneuropathy, unspecified: Secondary | ICD-10-CM

## 2018-03-21 NOTE — Telephone Encounter (Signed)
Last RX sent 01/03/18 for #120   Last OV 02/26/18  RX pended, please review and send if appropriate

## 2018-04-03 ENCOUNTER — Encounter: Payer: Self-pay | Admitting: Physician Assistant

## 2018-04-03 ENCOUNTER — Other Ambulatory Visit: Payer: Self-pay

## 2018-04-03 ENCOUNTER — Telehealth (INDEPENDENT_AMBULATORY_CARE_PROVIDER_SITE_OTHER): Payer: Medicare Other | Admitting: Physician Assistant

## 2018-04-03 VITALS — Temp 98.5°F | Wt 181.0 lb

## 2018-04-03 DIAGNOSIS — J309 Allergic rhinitis, unspecified: Secondary | ICD-10-CM | POA: Diagnosis not present

## 2018-04-03 MED ORDER — PREDNISONE 50 MG PO TABS: ORAL_TABLET | ORAL | 0 refills | Status: DC

## 2018-04-03 MED ORDER — FLUTICASONE PROPIONATE 50 MCG/ACT NA SUSP: 2.0000 | Freq: Every day | NASAL | 6 refills | Status: DC

## 2018-04-03 NOTE — Progress Notes (Signed)
.Virtual Visit via Telephone Note  I connected with Walter Wilkerson on 04/03/18 at 10:10 AM EDT by telephone and verified that I am speaking with the correct person using two identifiers.   I discussed the limitations, risks, security and privacy concerns of performing an evaluation and management service by telephone and the availability of in person appointments. I also discussed with the patient that there may be a patient responsible charge related to this service. The patient expressed understanding and agreed to proceed.   History of Present Illness: Pt is a 68 yo male with hx of lung cancer and resection who calls into office with one day of coughing, nasal congestion and pressure, and ST. He worked outside all day yesterday in the rain. He worked outside entire weekend. Cough started that evening with runny/congested nose. His throat is more sore this morning. No body aches, SOB, fever, chest tightness. OTC tylenol cold and sinus did ease symptoms this morning. No travel or sick contact. No sinus pressure or headache.   .. Active Ambulatory Problems    Diagnosis Date Noted  . Acquired hypothyroidism 08/11/2014  . Essential hypertension 08/11/2014  . Hyperlipidemia 08/11/2014  . Peripheral neuropathy ([place]) 08/11/2014  . Non-small cell lung cancer ([place]) 08/11/2014  . Shoulder impingement 02/24/2015  . Hyperglycemia 04/14/2015  . Prediabetes 04/15/2015  . Dermatitis 10/18/2015  . Cyst of pharynx 01/12/2016  . Aortic atherosclerosis ([place]) 01/12/2016  . Elevated serum creatinine 02/10/2016  . Fatigue 11/04/2016  . Stress at work 11/04/2016  . Adhesive capsulitis 12/27/2016  . Nausea 04/20/2017  . Epigastric pain 04/20/2017  . PUD (peptic ulcer disease) 04/20/2017  . Metatarsal stress fracture of right foot 01/15/2018  . 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 Acute bronchitis 01/08/2015   Past Medical  History:  Diagnosis Date  . Hypertension   . Lung cancer ([place])   . Thyroid disease    Reviewed medication and problem list.    Observations/Objective: No acute distress. Dry cough over phone and clearing of throat.  No labored breathing.   .. Today's Vitals   04/03/18 1018  Temp: 98.5 F (36.9 C)  TempSrc: Oral  Weight: 181 lb (82.1 kg)   Body mass index is 27.52 kg/m.   Assessment and Plan: [name] KitchenDiagnoses and all orders for this visit:  Allergic sinusitis -     predniSONE (DELTASONE) 50 MG tablet; One tab PO daily for 5 days. -     fluticasone (FLONASE) 50 MCG/ACT nasal spray; Place 2 sprays into both nostrils daily.   Symptoms just started and due to being outside all day long seem more allergic. He is high risk after having lung cancer and lung resection for COVID. He denies any sick contact or travel. He denies any fever, SOB or chest tightness. COVID symptoms and signs discussed. Call office with any. Pt does not have a albuterol inhaler. At this time no SOB. Could consider rx as needed.  All symptoms seem more allergic. Burst of prednisone given add flonase. Consider tylenol cold sinus severe for next 2-3 days. If symptoms worsening please call office.   Follow Up Instructions:    I discussed the assessment and treatment plan with the patient. The patient was provided an opportunity to ask questions and all were answered. The patient agreed with the plan and demonstrated an understanding of the instructions.   The patient was advised to call back or seek  an in-person evaluation if the symptoms worsen or if the condition fails to improve as anticipated.  I provided  11 minutes of non-face-to-face time during this encounter.   Iran Planas, PA-C

## 2018-04-16 ENCOUNTER — Telehealth: Payer: Self-pay

## 2018-04-16 NOTE — Telephone Encounter (Signed)
Patient left vm stating he has a lingering cough that is keeping him up at night. He is asking for cough medication. He was seen for a visit on 04/03/2018 and given Prednisone and Flonase for symptoms of allergic sinusitis.  Please advise.

## 2018-04-18 MED ORDER — BENZONATATE 200 MG PO CAPS: 200.0000 mg | ORAL_CAPSULE | Freq: Two times a day (BID) | ORAL | 0 refills | Status: DC | PRN

## 2018-04-18 NOTE — Telephone Encounter (Signed)
I sent some tessalon pearls over. Cough syrup has hydrocodone in it which you are getting three times a day as well. When coughing is bad time your pain medicine around that time. You can also use some OTC delsym.

## 2018-04-18 NOTE — Telephone Encounter (Signed)
Left pt msg on ID'd VM with Jade's recommendations and also advised him of his RX at the pharmacy

## 2018-04-19 ENCOUNTER — Other Ambulatory Visit: Payer: Self-pay | Admitting: Physician Assistant

## 2018-04-19 DIAGNOSIS — I10 Essential (primary) hypertension: Secondary | ICD-10-CM

## 2018-04-23 ENCOUNTER — Other Ambulatory Visit: Payer: Self-pay | Admitting: Physician Assistant

## 2018-04-23 DIAGNOSIS — G894 Chronic pain syndrome: Secondary | ICD-10-CM

## 2018-04-23 DIAGNOSIS — G629 Polyneuropathy, unspecified: Secondary | ICD-10-CM

## 2018-04-23 NOTE — Telephone Encounter (Signed)
..  PDMP reviewed during this encounter. No concerns.  Refill sent to pharmacy.

## 2018-05-03 ENCOUNTER — Other Ambulatory Visit: Payer: Self-pay | Admitting: Physician Assistant

## 2018-05-03 DIAGNOSIS — G629 Polyneuropathy, unspecified: Secondary | ICD-10-CM

## 2018-05-03 NOTE — Telephone Encounter (Signed)
Please advise on RF

## 2018-05-23 ENCOUNTER — Other Ambulatory Visit: Payer: Self-pay | Admitting: Neurology

## 2018-05-23 DIAGNOSIS — G629 Polyneuropathy, unspecified: Secondary | ICD-10-CM

## 2018-05-23 DIAGNOSIS — G894 Chronic pain syndrome: Secondary | ICD-10-CM

## 2018-05-23 MED ORDER — HYDROCODONE-ACETAMINOPHEN 10-325 MG PO TABS: ORAL_TABLET | ORAL | 0 refills | Status: DC

## 2018-05-23 NOTE — Telephone Encounter (Signed)
Jade please advise if okay to refill before appt?

## 2018-05-23 NOTE — Telephone Encounter (Signed)
Patient called and left vm for refill of hydrocodone RX to [facility]. Patient is due for follow up appt.   Walter Wilkerson- Please call patient and schedule virtual/office visit with [facility] for follow up prior to refills. Thanks!

## 2018-05-23 NOTE — Telephone Encounter (Signed)
Pt is scheduled for 05/28/18 with Luvenia Starch for his 3 mo f/u

## 2018-05-28 ENCOUNTER — Encounter: Payer: Self-pay | Admitting: Physician Assistant

## 2018-05-28 ENCOUNTER — Ambulatory Visit (INDEPENDENT_AMBULATORY_CARE_PROVIDER_SITE_OTHER): Payer: Medicare Other | Admitting: Physician Assistant

## 2018-05-28 VITALS — BP 159/84 | HR 70 | Temp 98.4°F | Ht 68.5 in | Wt 185.0 lb

## 2018-05-28 DIAGNOSIS — I700 Atherosclerosis of aorta: Secondary | ICD-10-CM

## 2018-05-28 DIAGNOSIS — I10 Essential (primary) hypertension: Secondary | ICD-10-CM

## 2018-05-28 DIAGNOSIS — M84374D Stress fracture, right foot, subsequent encounter for fracture with routine healing: Secondary | ICD-10-CM

## 2018-05-28 DIAGNOSIS — G894 Chronic pain syndrome: Secondary | ICD-10-CM

## 2018-05-28 DIAGNOSIS — G629 Polyneuropathy, unspecified: Secondary | ICD-10-CM | POA: Diagnosis not present

## 2018-05-28 MED ORDER — HYDROCODONE-ACETAMINOPHEN 10-325 MG PO TABS: 1.0000 | ORAL_TABLET | Freq: Four times a day (QID) | ORAL | 0 refills | Status: DC | PRN

## 2018-05-28 MED ORDER — HYDROCODONE-ACETAMINOPHEN 10-325 MG PO TABS: ORAL_TABLET | ORAL | 0 refills | Status: DC

## 2018-05-28 NOTE — Progress Notes (Signed)
Subjective:    Patient ID: Walter Wilkerson, male    DOB: [birth year], 68 y.o.   MRN: [id number]  HPI  Pt is a 68 yo male with HTN, hypothyroidism, hyperlipidemia and chronic pain who presents to the clinic for 3 month refills.   Pt is doing well. No CP, palpitations, headaches, or vision changes. Pain is controlled. Right foot stress fracture has completely healed. He is walking and exercising again. Not checking BP at home but he is taking all of his medications.   .. Active Ambulatory Problems    Diagnosis Date Noted  . Acquired hypothyroidism 08/11/2014  . Essential hypertension 08/11/2014  . Hyperlipidemia 08/11/2014  . Peripheral neuropathy ([place]) 08/11/2014  . Non-small cell lung cancer ([place]) 08/11/2014  . Shoulder impingement 02/24/2015  . Hyperglycemia 04/14/2015  . Prediabetes 04/15/2015  . Dermatitis 10/18/2015  . Cyst of pharynx 01/12/2016  . Aortic atherosclerosis ([place]) 01/12/2016  . Elevated serum creatinine 02/10/2016  . Fatigue 11/04/2016  . Stress at work 11/04/2016  . Adhesive capsulitis 12/27/2016  . Nausea 04/20/2017  . Epigastric pain 04/20/2017  . PUD (peptic ulcer disease) 04/20/2017  . Metatarsal stress fracture of right foot 01/15/2018  . 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 Acute bronchitis 01/08/2015   Past Medical History:  Diagnosis Date  . Hypertension   . Lung cancer ([place])   . Thyroid disease        Review of Systems  All other systems reviewed and are negative.      Objective:   Physical Exam Vitals signs reviewed.  Constitutional:      Appearance: Normal appearance.  HENT:     Head: Normocephalic.  Cardiovascular:     Rate and Rhythm: Normal rate and regular rhythm.     Pulses: Normal pulses.  Pulmonary:     Effort: Pulmonary effort is normal.     Breath sounds: Normal breath sounds.     Comments: Absent breath sounds over resected  lung. Neurological:     General: No focal deficit present.     Mental Status: He is alert and oriented to person, place, and time.  Psychiatric:        Mood and Affect: Mood normal.        Behavior: Behavior normal.           Assessment & Plan:  [name] KitchenCordaryl was seen today for pain.  Diagnoses and all orders for this visit:  Chronic pain syndrome -     HYDROcodone-acetaminophen (NORCO) 10-325 MG tablet; Take 1 tablet by mouth every 6 (six) hours as needed. -     HYDROcodone-acetaminophen (NORCO) 10-325 MG tablet; Take 1 tablet by mouth every 6 (six) hours as needed. -     HYDROcodone-acetaminophen (NORCO) 10-325 MG tablet; Take 1 tablet by mouth every 6 (six) hours as needed.  Peripheral polyneuropathy -     HYDROcodone-acetaminophen (NORCO) 10-325 MG tablet; Take 1 tablet by mouth every 6 (six) hours as needed. -     HYDROcodone-acetaminophen (NORCO) 10-325 MG tablet; Take 1 tablet by mouth every 6 (six) hours as needed. -     HYDROcodone-acetaminophen (NORCO) 10-325 MG tablet; Take 1 tablet by mouth every 6 (six) hours as needed.  Aortic atherosclerosis ([facility])  Essential hypertension  Stress fracture of metatarsal bone of right foot with routine healing, subsequent encounter   BP elevated today. Discussed sodium in diet. Continue diovan. Goal BP under  150/90. May need to consider norvasc addition. Will get BP cuff at home and start monitoring. Discussed in depth the risk of elevated blood pressure. Report home readings in next 2 weeks.   Discussed over CV risk and importance of BP and Cholesterol control.   Pt reported he stopped lipitor due to muscle aches and pain. It appears from pharmacy he is getting filled. Not entirely certain he has stopped and if not he is tolerating. Needs to go home and check medication bottles to be certain. Last LDL was 5 months ago and looked great.    Stress fracture pain has resolved. Pt is back to normal activities.   [name] KitchenPDMP reviewed during  this encounter.  Last refill 5/13  Pain contract up to date. 12/09/17 UDS is due but going to wait on 11/2018 contract renewl.  Refilled pain rx for 3 months.

## 2018-05-28 NOTE — Patient Instructions (Signed)
Managing Your Hypertension  Hypertension is commonly called high blood pressure. This is when the force of your blood pressing against the walls of your arteries is too strong. Arteries are blood vessels that carry blood from your heart throughout your body. Hypertension forces the heart to work harder to pump blood, and may cause the arteries to become narrow or stiff. Having untreated or uncontrolled hypertension can cause heart attack, stroke, kidney disease, and other problems.  What are blood pressure readings?  A blood pressure reading consists of a higher number over a lower number. Ideally, your blood pressure should be below 120/80. The first ("top") number is called the systolic pressure. It is a measure of the pressure in your arteries as your heart beats. The second ("bottom") number is called the diastolic pressure. It is a measure of the pressure in your arteries as the heart relaxes.  What does my blood pressure reading mean?  Blood pressure is classified into four stages. Based on your blood pressure reading, your health care provider may use the following stages to determine what type of treatment you need, if any. Systolic pressure and diastolic pressure are measured in a unit called mm Hg.  Normal   Systolic pressure: below 120.   Diastolic pressure: below 80.  Elevated   Systolic pressure: 120-129.   Diastolic pressure: below 80.  Hypertension stage 1   Systolic pressure: 130-139.   Diastolic pressure: 80-89.  Hypertension stage 2   Systolic pressure: 140 or above.   Diastolic pressure: 90 or above.  What health risks are associated with hypertension?  Managing your hypertension is an important responsibility. Uncontrolled hypertension can lead to:   A heart attack.   A stroke.   A weakened blood vessel (aneurysm).   Heart failure.   Kidney damage.   Eye damage.   Metabolic syndrome.   Memory and concentration problems.  What changes can I make to manage my  hypertension?  Hypertension can be managed by making lifestyle changes and possibly by taking medicines. Your health care provider will help you make a plan to bring your blood pressure within a normal range.  Eating and drinking     Eat a diet that is high in fiber and potassium, and low in salt (sodium), added sugar, and fat. An example eating plan is called the DASH (Dietary Approaches to Stop Hypertension) diet. To eat this way:  ? Eat plenty of fresh fruits and vegetables. Try to fill half of your plate at each meal with fruits and vegetables.  ? Eat whole grains, such as whole wheat pasta, brown rice, or whole grain bread. Fill about one quarter of your plate with whole grains.  ? Eat low-fat diary products.  ? Avoid fatty cuts of meat, processed or cured meats, and poultry with skin. Fill about one quarter of your plate with lean proteins such as fish, chicken without skin, beans, eggs, and tofu.  ? Avoid premade and processed foods. These tend to be higher in sodium, added sugar, and fat.   Reduce your daily sodium intake. Most people with hypertension should eat less than 1,500 mg of sodium a day.   Limit alcohol intake to no more than 1 drink a day for nonpregnant women and 2 drinks a day for men. One drink equals 12 oz of beer, 5 oz of wine, or 1 oz of hard liquor.  Lifestyle   Work with your health care provider to maintain a healthy body weight, or to lose   weight. Ask what an ideal weight is for you.   Get at least 30 minutes of exercise that causes your heart to beat faster (aerobic exercise) most days of the week. Activities may include walking, swimming, or biking.   Include exercise to strengthen your muscles (resistance exercise), such as weight lifting, as part of your weekly exercise routine. Try to do these types of exercises for 30 minutes at least 3 days a week.   Do not use any products that contain nicotine or tobacco, such as cigarettes and e-cigarettes. If you need help quitting,  ask your health care provider.   Control any long-term (chronic) conditions you have, such as high cholesterol or diabetes.  Monitoring   Monitor your blood pressure at home as told by your health care provider. Your personal target blood pressure may vary depending on your medical conditions, your age, and other factors.   Have your blood pressure checked regularly, as often as told by your health care provider.  Working with your health care provider   Review all the medicines you take with your health care provider because there may be side effects or interactions.   Talk with your health care provider about your diet, exercise habits, and other lifestyle factors that may be contributing to hypertension.   Visit your health care provider regularly. Your health care provider can help you create and adjust your plan for managing hypertension.  Will I need medicine to control my blood pressure?  Your health care provider may prescribe medicine if lifestyle changes are not enough to get your blood pressure under control, and if:   Your systolic blood pressure is 130 or higher.   Your diastolic blood pressure is 80 or higher.  Take medicines only as told by your health care provider. Follow the directions carefully. Blood pressure medicines must be taken as prescribed. The medicine does not work as well when you skip doses. Skipping doses also puts you at risk for problems.  Contact a health care provider if:   You think you are having a reaction to medicines you have taken.   You have repeated (recurrent) headaches.   You feel dizzy.   You have swelling in your ankles.   You have trouble with your vision.  Get help right away if:   You develop a severe headache or confusion.   You have unusual weakness or numbness, or you feel faint.   You have severe pain in your chest or abdomen.   You vomit repeatedly.   You have trouble breathing.  Summary   Hypertension is when the force of blood pumping  through your arteries is too strong. If this condition is not controlled, it may put you at risk for serious complications.   Your personal target blood pressure may vary depending on your medical conditions, your age, and other factors. For most people, a normal blood pressure is less than 120/80.   Hypertension is managed by lifestyle changes, medicines, or both. Lifestyle changes include weight loss, eating a healthy, low-sodium diet, exercising more, and limiting alcohol.  This information is not intended to replace advice given to you by your health care provider. Make sure you discuss any questions you have with your health care provider.  Document Released: 09/21/2011 Document Revised: 11/25/2015 Document Reviewed: 11/25/2015  Elsevier Interactive Patient Education  2019 [employer]

## 2018-05-29 ENCOUNTER — Encounter: Payer: Self-pay | Admitting: Physician Assistant

## 2018-05-29 DIAGNOSIS — G894 Chronic pain syndrome: Secondary | ICD-10-CM | POA: Insufficient documentation

## 2018-06-11 ENCOUNTER — Ambulatory Visit (INDEPENDENT_AMBULATORY_CARE_PROVIDER_SITE_OTHER): Payer: Medicare Other | Admitting: Physician Assistant

## 2018-06-11 ENCOUNTER — Encounter: Payer: Self-pay | Admitting: Physician Assistant

## 2018-06-11 VITALS — BP 179/78 | HR 78 | Temp 98.4°F | Ht 68.5 in | Wt 184.0 lb

## 2018-06-11 DIAGNOSIS — L739 Follicular disorder, unspecified: Secondary | ICD-10-CM

## 2018-06-11 MED ORDER — DOXYCYCLINE HYCLATE 100 MG PO TABS: 100.0000 mg | ORAL_TABLET | Freq: Two times a day (BID) | ORAL | 0 refills | Status: DC

## 2018-06-11 NOTE — Progress Notes (Signed)
   Subjective:    Patient ID: Walter Wilkerson, male    DOB: February 02, 1950, 68 y.o.   MRN: [id number]  HPI Pt is a 68 yo male who presents to the clinic with tiny bumps for the last 2-3 months on jaw line and in his beard area. Since he stopped working he admits to stop shaving. He is Surveyor, mining. He also changed toothpaste about 2.5 months ago as well. He has done nothing to make better. No fever, chills.   .. Active Ambulatory Problems    Diagnosis Date Noted  . Acquired hypothyroidism 08/11/2014  . Essential hypertension 08/11/2014  . Hyperlipidemia 08/11/2014  . Peripheral neuropathy ([place]) 08/11/2014  . Non-small cell lung cancer ([place]) 08/11/2014  . Shoulder impingement 02/24/2015  . Hyperglycemia 04/14/2015  . Prediabetes 04/15/2015  . Dermatitis 10/18/2015  . Cyst of pharynx 01/12/2016  . Aortic atherosclerosis ([place]) 01/12/2016  . Elevated serum creatinine 02/10/2016  . Fatigue 11/04/2016  . Stress at work 11/04/2016  . Adhesive capsulitis 12/27/2016  . Nausea 04/20/2017  . Epigastric pain 04/20/2017  . PUD (peptic ulcer disease) 04/20/2017  . Metatarsal stress fracture of right foot 01/15/2018  . 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 Chronic pain syndrome 05/29/2018   Resolved Ambulatory Problems    Diagnosis Date Noted  . Acute bronchitis 01/08/2015   Past Medical History:  Diagnosis Date  . Hypertension   . Lung cancer ([place])   . Thyroid disease      Review of Systems See HPI.     Objective:   Physical Exam Vitals signs reviewed.  Constitutional:      Appearance: Normal appearance.  Cardiovascular:     Rate and Rhythm: Normal rate and regular rhythm.  Pulmonary:     Effort: Pulmonary effort is normal.  Skin:    Comments: Pustule, erythematous papules found in hair follices along jawline and chin and down neck.   Neurological:     General: No focal deficit present.     Mental Status: He is alert and oriented to person, place, and time.   Psychiatric:        Mood and Affect: Mood normal.           Assessment & Plan:  [name] KitchenKeshawn was seen today for rash.  Diagnoses and all orders for this visit:  Folliculitis -     doxycycline (VIBRA-TABS) 100 MG tablet; Take 1 tablet (100 mg total) by mouth 2 (two) times daily. For 10 days.  appears like folliculitis. Treated with doxycycline. Has bactroban at home can spot use. Suggest cleaning face with OTC benzyol peroxide regularly. Follow up as needed.

## 2018-06-11 NOTE — Patient Instructions (Signed)

## 2018-06-13 ENCOUNTER — Ambulatory Visit: Payer: Medicare Other | Admitting: Physician Assistant

## 2018-07-23 ENCOUNTER — Ambulatory Visit (INDEPENDENT_AMBULATORY_CARE_PROVIDER_SITE_OTHER): Payer: Medicare Other | Admitting: Family Medicine

## 2018-07-23 ENCOUNTER — Encounter: Payer: Self-pay | Admitting: Family Medicine

## 2018-07-23 ENCOUNTER — Ambulatory Visit (INDEPENDENT_AMBULATORY_CARE_PROVIDER_SITE_OTHER): Payer: Medicare Other

## 2018-07-23 ENCOUNTER — Other Ambulatory Visit: Payer: Self-pay

## 2018-07-23 VITALS — BP 129/74 | HR 75 | Temp 97.9°F | Wt 186.0 lb

## 2018-07-23 DIAGNOSIS — M25561 Pain in right knee: Secondary | ICD-10-CM

## 2018-07-23 NOTE — Progress Notes (Signed)
Walter Wilkerson is a 68 y.o. male who presents to [place] today for right knee pain that started 10 days ago after a long walk. Denies injury event. Describes pain as sharp shooting pain starting on right medial knee radiating behind patella to lateral knee. Patient takes Norco for joint pain but it is not affecting the knee pain. Pain is sporadic both at rest and occasionally wakes him from sleep.  Pain is located at the medial aspect of the knee.  No locking or catching or giving way.  Worse with activity.    ROS:  As above  Exam:  BP 129/74   Pulse 75   Temp 97.9 F (36.6 C) (Oral)   Wt 186 lb (84.4 kg)   BMI 27.87 kg/m  Wt Readings from Last 5 Encounters:  07/23/18 186 lb (84.4 kg)  06/11/18 184 lb (83.5 kg)  05/28/18 185 lb (83.9 kg)  04/03/18 181 lb (82.1 kg)  02/26/18 182 lb (82.6 kg)   General: Well Developed, well nourished, and in no acute distress.  Neuro/Psych: Alert and oriented x3, extra-ocular muscles intact, able to move all 4 extremities, sensation grossly intact. Skin: Warm and dry, no rashes noted.  Respiratory: Not using accessory muscles, speaking in full sentences, trachea midline.  Cardiovascular: Pulses palpable, no extremity edema. Abdomen: Does not appear distended. MSK: R-Knee: Small effusion present otherwise normal-appearing no erythema. Range of motion 0-120 degrees. . Tender to palpation along medial and lateral knee. Pain with knee flexion and extension. Normal strength with knee flexion and extension. 2+ patellar reflex. Negative anterior drawer test.  Normal valgus and varus test. Positive medial McMurray's test.  Contralateral left knee normal-appearing normal motion nontender.   Lab and Radiology Results No results found for this or any previous visit (from the past 72 hour(s)). Dg Knee 1-2 Views Left  Result Date: 07/23/2018 CLINICAL DATA:  Comparison to right side EXAM: LEFT KNEE - 1-2  VIEW COMPARISON:  None. FINDINGS: Mild medial joint space narrowing is noted. No acute fracture or dislocation is seen. IMPRESSION: Mild degenerative change without acute abnormality. Electronically Signed   By: Inez Catalina M.D.   On: 07/23/2018 17:07   Dg Knee Complete 4 Views Right  Result Date: 07/23/2018 CLINICAL DATA:  Right knee pain, no known injury, initial encounter EXAM: RIGHT KNEE - COMPLETE 4+ VIEW COMPARISON:  None. FINDINGS: Mild medial joint space narrowing is noted. No acute fracture or dislocation is seen. Mild patellofemoral degenerative changes are noted as well. IMPRESSION: Mild degenerative change without acute abnormality. Electronically Signed   By: Inez Catalina M.D.   On: 07/23/2018 17:05  I personally (independently) visualized and performed the interpretation of the images attached in this note.   Procedure: Real-time Ultrasound Guided Injection of right knee Device: GE Logiq E   Images permanently stored and available for review in the ultrasound unit. Verbal informed consent obtained.  Discussed risks and benefits of procedure. Warned about infection bleeding damage to structures skin hypopigmentation and fat atrophy among others. Patient expresses understanding and agreement Time-out conducted.   Noted no overlying erythema, induration, or other signs of local infection.   Skin prepped in a sterile fashion.   Local anesthesia: Topical Ethyl chloride.   With sterile technique and under real time ultrasound guidance:  80 mg of Depo-Medrol and 4 mL of Marcaine injected easily.   Completed without difficulty   Pain immediately resolved suggesting accurate placement of the medication.   Advised to  call if fevers/chills, erythema, induration, drainage, or persistent bleeding.   Images permanently stored and available for review in the ultrasound unit.  Impression: Technically successful ultrasound guided injection.          Assessment and Plan: 68 y.o. male  with 10-day Hx of pain in medial right knee that began after a long walk without a notable injury event. Nothing has helped the pain and it is sporadic. Performed US which showed small bone spur on medial tibia and a small medial effusion. Sent for DG knee x-ray which showed minor OA but good joint spacing and no fracture. Meniscal tear, ligament strain, exacerbation of DJD, and gout are all on the differential. Performed right knee steroid injection. Recommend diclofenac gel.  Recheck if not improving.   PDMP not reviewed this encounter. Orders Placed This Encounter  Procedures  . DG Knee Complete 4 Views Right    Please include patellar sunrise, lateral, and weightbearing bilateral AP and bilateral rosenberg views    Standing Status:   Future    Number of Occurrences:   1    Standing Expiration Date:   09/22/2019    Order Specific Question:   Reason for exam:    Answer:   Please include patellar sunrise, lateral, and weightbearing bilateral AP and bilateral rosenberg views    Comments:   Please include patellar sunrise, lateral, and weightbearing bilateral AP and bilateral rosenberg views    Order Specific Question:   Preferred imaging location?    Answer:   [facility]  . DG Knee 1-2 Views Left    Standing Status:   Future    Number of Occurrences:   1    Standing Expiration Date:   09/23/2019    Order Specific Question:   Reason for Exam (SYMPTOM  OR DIAGNOSIS REQUIRED)    Answer:   For use with right knee x-ray, bilateral AP and Rosenberg standing.    Order Specific Question:   Preferred imaging location?    Answer:   [facility]   No orders of the defined types were placed in this encounter.   Historical information moved to improve visibility of documentation.  Past Medical History:  Diagnosis Date  . Hypertension   . Lung cancer ([place])   . Thyroid disease    Past Surgical History:  Procedure Laterality Date  . LUNG LOBECTOMY Right 2008   RLL resection   . VASECTOMY     Social History   Tobacco Use  . Smoking status: Former Smoker    Quit date: 08/10/2004    Years since quitting: 13.9  . Smokeless tobacco: Never Used  Substance Use Topics  . Alcohol use: Yes    Alcohol/week: 5.0 standard drinks    Types: 5 Standard drinks or equivalent per week   family history includes Diabetes in his unknown relative; Heart attack in his father; Hyperlipidemia in his father; Hypertension in his father.  Medications: Current Outpatient Medications  Medication Sig Dispense Refill  . atorvastatin (LIPITOR) 40 MG tablet Take 1 tablet (40 mg total) by mouth daily. 90 tablet 4  . diclofenac sodium (VOLTAREN) 1 % GEL Apply 4 g topically 4 (four) times daily. 1 Tube 1  . doxycycline (VIBRA-TABS) 100 MG tablet Take 1 tablet (100 mg total) by mouth 2 (two) times daily. For 10 days. 20 tablet 0  . fluticasone (FLONASE) 50 MCG/ACT nasal spray Place 2 sprays into both nostrils daily. 16 g 6  . gabapentin (NEURONTIN) 100 MG capsule TAKE  ONE TO THREE CAPSULES BY MOUTH THREE TIMES DAILY 540 capsule 1  . HYDROcodone-acetaminophen (NORCO) 10-325 MG tablet Take 1 tablet by mouth every 6 (six) hours as needed. 120 tablet 0  . HYDROcodone-acetaminophen (NORCO) 10-325 MG tablet Take 1 tablet by mouth every 6 (six) hours as needed. 120 tablet 0  . [START ON 07/28/2018] HYDROcodone-acetaminophen (NORCO) 10-325 MG tablet Take 1 tablet by mouth every 6 (six) hours as needed. 120 tablet 0  . levothyroxine (SYNTHROID, LEVOTHROID) 150 MCG tablet Take 1 tablet (150 mcg total) by mouth daily before breakfast. 90 tablet 4  . Multiple Vitamin (MULTIVITAMIN) tablet Take 1 tablet by mouth daily.    . valsartan-hydrochlorothiazide (DIOVAN HCT) 320-25 MG tablet Take 1 tablet by mouth daily. 90 tablet 1   No current facility-administered medications for this visit.    Allergies  Allergen Reactions  . Lipitor [Atorvastatin Calcium]     Myalgias.   . Lisinopril Other (See Comments)  .  Pregabalin Other (See Comments)    Elevated LFTs      Discussed warning signs or symptoms. Please see discharge instructions. Patient expresses understanding.  I personally was present and performed or re-performed the history, physical exam and medical decision-making activities of this service and have verified that the service and findings are accurately documented in the student's note. ___________________________________________ Lynne Leader M.D., ABFM., CAQSM. Primary Care and Sports Medicine Adjunct Instructor of [employer] [employer]

## 2018-07-23 NOTE — Patient Instructions (Signed)
Thank you for coming in today. Add the diclofenac gel 4x daily for pain as needed.  Call or go to the ER if you develop a large red swollen joint with extreme pain or oozing puss.  If not improving let me know.    Meniscus Tear  A meniscus tear is a knee injury that happens when a piece of the meniscus is torn. The meniscus is a thick, rubbery, wedge-shaped cartilage in the knee. Two menisci are located in each knee. They sit between the upper bone (femur) and lower bone (tibia) that make up the knee joint. Each meniscus acts as a shock absorber for the knee. A torn meniscus is one of the most common types of knee injuries. This injury can range from mild to severe. Surgery may be needed to repair a severe tear. What are the causes? This condition may be caused by any kneeling, squatting, twisting, or pivoting movement. Sports-related injuries are the most common cause. These often occur from:  Running and stopping suddenly. ? Changing direction. ? Being tackled or knocked off your feet.  Lifting or carrying heavy weights. As people get older, their menisci get thinner and weaker. In these people, tears can happen more easily, such as from climbing stairs. What increases the risk? You are more likely to develop this condition if you:  Play contact sports.  Have a job that requires kneeling or squatting.  Are male.  Are over 79 years old. What are the signs or symptoms? Symptoms of this condition include:  Knee pain, especially at the side of the knee joint. You may feel pain when the injury occurs, or you may only hear a pop and feel pain later.  A feeling that your knee is clicking, catching, locking, or giving way.  Not being able to fully bend or extend your knee.  Bruising or swelling in your knee. How is this diagnosed? This condition may be diagnosed based on your symptoms and a physical exam. You may also have tests, such as:  X-rays.  MRI.  A procedure to look  inside your knee with a narrow surgical telescope (arthroscopy). You may be referred to a knee specialist (orthopedic surgeon). How is this treated? Treatment for this injury depends on the severity of the tear. Treatment for a mild tear may include:  Rest.  Medicine to reduce pain and swelling. This is usually a nonsteroidal anti-inflammatory drug (NSAID), like ibuprofen.  A knee brace, sleeve, or wrap.  Using crutches or a walker to keep weight off your knee and to help you walk.  Exercises to strengthen your knee (physical therapy). You may need surgery if you have a severe tear or if other treatments are not working. Follow these instructions at home: If you have a brace, sleeve, or wrap:  Wear it as told by your health care provider. Remove it only as told by your health care provider.  Loosen the brace, sleeve, or wrap if your toes tingle, become numb, or turn cold and blue.  Keep the brace, sleeve, or wrap clean and dry.  If the brace, sleeve, or wrap is not waterproof: ? Do not let it get wet. ? Cover it with a watertight covering when you take a bath or shower. Managing pain and swelling   Take over-the-counter and prescription medicines only as told by your health care provider.  If directed, put ice on your knee: ? If you have a removable brace, sleeve, or wrap, remove it as told by  your health care provider. ? Put ice in a plastic bag. ? Place a towel between your skin and the bag. ? Leave the ice on for 20 minutes, 2-3 times per day.  Move your toes often to avoid stiffness and to lessen swelling.  Raise (elevate) the injured area above the level of your heart while you are sitting or lying down. Activity  Do not use the injured limb to support your body weight until your health care provider says that you can. Use crutches or a walker as told by your health care provider.  Return to your normal activities as told by your health care provider. Ask your health  care provider what activities are safe for you.  Perform range-of-motion exercises only as told by your health care provider.  Begin doing exercises to strengthen your knee and leg muscles only as told by your health care provider. After you recover, your health care provider may recommend these exercises to help prevent another injury. General instructions  Use a knee brace, sleeve, or wrap as told by your health care provider.  Ask your health care provider when it is safe to drive if you have a brace, sleeve, or wrap on your knee.  Do not use any products that contain nicotine or tobacco, such as cigarettes, e-cigarettes, and chewing tobacco. If you need help quitting, ask your health care provider.  Ask your health care provider if the medicine prescribed to you: ? Requires you to avoid driving or using heavy machinery. ? Can cause constipation. You may need to take these actions to prevent or treat constipation:  Drink enough fluid to keep your urine pale yellow.  Take over-the-counter or prescription medicines.  Eat foods that are high in fiber, such as beans, whole grains, and fresh fruits and vegetables.  Limit foods that are high in fat and processed sugars, such as fried or sweet foods.  Keep all follow-up visits as told by your health care provider. This is important. Contact a health care provider if:  You have a fever.  Your knee becomes red, tender, or swollen.  Your pain medicine is not helping.  Your symptoms get worse or do not improve after 2 weeks of home care. Summary  A meniscus tear is a knee injury that happens when a piece of the meniscus is torn.  Treatment for this injury depends on the severity of the tear. You may need surgery if you have a severe tear or if other treatments are not working.  Rest, ice, and raise (elevate) your injured knee as told by your health care provider. This will help lessen pain and swelling.  Contact a health care  provider if you have new symptoms, or your symptoms get worse or do not improve after 2 weeks of home care.  Keep all follow-up visits as told by your health care provider. This is important. This information is not intended to replace advice given to you by your health care provider. Make sure you discuss any questions you have with your health care provider. Document Released: 03/19/2002 Document Revised: 07/11/2017 Document Reviewed: 07/11/2017 Elsevier Patient Education  2020 [employer].

## 2018-08-16 ENCOUNTER — Other Ambulatory Visit: Payer: Self-pay | Admitting: Physician Assistant

## 2018-08-16 DIAGNOSIS — I10 Essential (primary) hypertension: Secondary | ICD-10-CM

## 2018-08-29 ENCOUNTER — Other Ambulatory Visit: Payer: Self-pay

## 2018-08-29 ENCOUNTER — Ambulatory Visit (INDEPENDENT_AMBULATORY_CARE_PROVIDER_SITE_OTHER): Payer: Medicare Other | Admitting: Physician Assistant

## 2018-08-29 ENCOUNTER — Encounter: Payer: Self-pay | Admitting: Physician Assistant

## 2018-08-29 VITALS — BP 154/78 | HR 79 | Temp 98.6°F | Ht 68.5 in | Wt 185.0 lb

## 2018-08-29 DIAGNOSIS — G894 Chronic pain syndrome: Secondary | ICD-10-CM

## 2018-08-29 DIAGNOSIS — G629 Polyneuropathy, unspecified: Secondary | ICD-10-CM | POA: Diagnosis not present

## 2018-08-29 DIAGNOSIS — I10 Essential (primary) hypertension: Secondary | ICD-10-CM | POA: Diagnosis not present

## 2018-08-29 DIAGNOSIS — Z23 Encounter for immunization: Secondary | ICD-10-CM

## 2018-08-29 MED ORDER — HYDROCODONE-ACETAMINOPHEN 10-325 MG PO TABS: 1.0000 | ORAL_TABLET | Freq: Four times a day (QID) | ORAL | 0 refills | Status: DC | PRN

## 2018-08-29 MED ORDER — HYDROCODONE-ACETAMINOPHEN 10-325 MG PO TABS: ORAL_TABLET | ORAL | 0 refills | Status: DC

## 2018-08-29 NOTE — Patient Instructions (Signed)

## 2018-08-29 NOTE — Progress Notes (Signed)
Subjective:    Patient ID: Walter Wilkerson, male    DOB: 12/22/1950, 68 y.o.   MRN: [id number]  HPI  Pt is a 68 yo male with chronic pain and neuropathy that presents to the clinic for 3 month pain follow up. He is doing well. No problems or concerns. He does not take BP. He denies any CP, SOb, palpitations, headaches, or dizziness. He is taking Diovan/HCT daily. He admits to drinking 2-3 beers or glasses of wine a night. He does exercise by walking but not been able to lift weights at the gym.   .. Active Ambulatory Problems    Diagnosis Date Noted  . Acquired hypothyroidism 08/11/2014  . Essential hypertension 08/11/2014  . Hyperlipidemia 08/11/2014  . Peripheral neuropathy ([place]) 08/11/2014  . Non-small cell lung cancer ([place]) 08/11/2014  . Shoulder impingement 02/24/2015  . Hyperglycemia 04/14/2015  . Prediabetes 04/15/2015  . Dermatitis 10/18/2015  . Cyst of pharynx 01/12/2016  . Aortic atherosclerosis ([place]) 01/12/2016  . Elevated serum creatinine 02/10/2016  . Fatigue 11/04/2016  . Stress at work 11/04/2016  . Adhesive capsulitis 12/27/2016  . Nausea 04/20/2017  . Epigastric pain 04/20/2017  . PUD (peptic ulcer disease) 04/20/2017  . Metatarsal stress fracture of right foot 01/15/2018  . 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 Chronic pain syndrome 05/29/2018   Resolved Ambulatory Problems    Diagnosis Date Noted  . Acute bronchitis 01/08/2015   Past Medical History:  Diagnosis Date  . Hypertension   . Lung cancer ([place])   . Thyroid disease       Review of Systems  All other systems reviewed and are negative.      Objective:   Physical Exam Vitals signs reviewed.  Constitutional:      Appearance: Normal appearance.  Cardiovascular:     Rate and Rhythm: Normal rate and regular rhythm.     Pulses: Normal pulses.  Pulmonary:     Effort: Pulmonary effort is normal.     Breath sounds: Normal breath sounds.  Neurological:     General: No  focal deficit present.     Mental Status: He is alert and oriented to person, place, and time.  Psychiatric:        Mood and Affect: Mood normal.        Behavior: Behavior normal.           Assessment & Plan:  [name] KitchenEzekial was seen today for pain.  Diagnoses and all orders for this visit:  Chronic pain syndrome -     HYDROcodone-acetaminophen (NORCO) 10-325 MG tablet; Take 1 tablet by mouth every 6 (six) hours as needed. -     HYDROcodone-acetaminophen (NORCO) 10-325 MG tablet; Take 1 tablet by mouth every 6 (six) hours as needed. -     HYDROcodone-acetaminophen (NORCO) 10-325 MG tablet; Take 1 tablet by mouth every 6 (six) hours as needed. -     Pain Mgmt, Profile 6 Conf w/o mM, U  Peripheral polyneuropathy -     HYDROcodone-acetaminophen (NORCO) 10-325 MG tablet; Take 1 tablet by mouth every 6 (six) hours as needed. -     HYDROcodone-acetaminophen (NORCO) 10-325 MG tablet; Take 1 tablet by mouth every 6 (six) hours as needed. -     HYDROcodone-acetaminophen (NORCO) 10-325 MG tablet; Take 1 tablet by mouth every 6 (six) hours as needed.  Essential hypertension  Other orders -     Flu Vaccine QUAD 36+ mos IM   .[name] KitchenPDMP reviewed during this  encounter. No concerns.  Refilled for 3 months.  Pain contract and UDS done today.   BP elevated. Discussed DASH diet and decreasing alcohol. He does not want to start another medication. I discussed if still elevated in 3 months would need too.

## 2018-08-31 LAB — PAIN MGMT, PROFILE 6 CONF W/O MM, U
6 Acetylmorphine: NEGATIVE ng/mL
Alcohol Metabolites: POSITIVE ng/mL — AB (ref <500)
Amphetamines: NEGATIVE ng/mL
Barbiturates: NEGATIVE ng/mL
Benzodiazepines: NEGATIVE ng/mL
Cocaine Metabolite: NEGATIVE ng/mL
Codeine: NEGATIVE ng/mL
Creatinine: 133.9 mg/dL
Ethyl Glucuronide (ETG): 221833 ng/mL
Ethyl Sulfate (ETS): 22558 ng/mL
Hydrocodone: 100 ng/mL
Hydromorphone: 131 ng/mL
Marijuana Metabolite: NEGATIVE ng/mL
Methadone Metabolite: NEGATIVE ng/mL
Morphine: NEGATIVE ng/mL
Norhydrocodone: 294 ng/mL
Opiates: POSITIVE ng/mL
Oxidant: NEGATIVE ug/mL
Oxycodone: NEGATIVE ng/mL
Phencyclidine: NEGATIVE ng/mL
pH: 5.8 (ref 4.5–9.0)

## 2018-09-03 NOTE — Progress Notes (Signed)
UDS did show some alcohol. Remind patient that you are NOT supposed to drink alcohol and take daily opioids. That is breaking the contract and this is a warning. Will randomly rescreen at a visit in future.

## 2018-11-05 ENCOUNTER — Telehealth: Payer: Self-pay | Admitting: Neurology

## 2018-11-05 NOTE — Telephone Encounter (Signed)
Patient left vm stating he has interviewed for a job and taken drug test which was positive due to hydrocodone use. Needs letter for job making them aware she prescribes medication. Letter written and at the front for pick up. Patient aware.

## 2018-11-14 ENCOUNTER — Other Ambulatory Visit: Payer: Self-pay

## 2018-11-14 ENCOUNTER — Ambulatory Visit (INDEPENDENT_AMBULATORY_CARE_PROVIDER_SITE_OTHER): Payer: Medicare Other | Admitting: Physician Assistant

## 2018-11-14 VITALS — BP 155/82 | HR 94 | Ht 68.0 in | Wt 181.0 lb

## 2018-11-14 DIAGNOSIS — R7303 Prediabetes: Secondary | ICD-10-CM

## 2018-11-14 DIAGNOSIS — Z0000 Encounter for general adult medical examination without abnormal findings: Secondary | ICD-10-CM

## 2018-11-14 DIAGNOSIS — G629 Polyneuropathy, unspecified: Secondary | ICD-10-CM | POA: Diagnosis not present

## 2018-11-14 DIAGNOSIS — E039 Hypothyroidism, unspecified: Secondary | ICD-10-CM

## 2018-11-14 DIAGNOSIS — E782 Mixed hyperlipidemia: Secondary | ICD-10-CM | POA: Diagnosis not present

## 2018-11-14 DIAGNOSIS — I10 Essential (primary) hypertension: Secondary | ICD-10-CM | POA: Diagnosis not present

## 2018-11-14 DIAGNOSIS — Z125 Encounter for screening for malignant neoplasm of prostate: Secondary | ICD-10-CM

## 2018-11-14 MED ORDER — GABAPENTIN 100 MG PO CAPS: ORAL_CAPSULE | ORAL | 1 refills | Status: DC

## 2018-11-14 MED ORDER — ATORVASTATIN CALCIUM 40 MG PO TABS: 40.0000 mg | ORAL_TABLET | Freq: Every day | ORAL | 4 refills | Status: DC

## 2018-11-14 MED ORDER — VALSARTAN-HYDROCHLOROTHIAZIDE 320-25 MG PO TABS: 1.0000 | ORAL_TABLET | Freq: Every day | ORAL | 1 refills | Status: DC

## 2018-11-14 MED ORDER — LEVOTHYROXINE SODIUM 150 MCG PO TABS: 150.0000 ug | ORAL_TABLET | Freq: Every day | ORAL | 4 refills | Status: DC

## 2018-11-14 NOTE — Progress Notes (Signed)
Subjective:    Patient ID: Walter Wilkerson, male    DOB: February 14, 1950, 68 y.o.   MRN: [id number]  HPI  Pt is a 68 yo male with HTN, hypothyroidism, pre-diabetes who presents to the clinic for annual physical.   He is doing really well. Pain controlled. Mood great. He would like to get back to work part-time at least. 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 He comes in with form.   Pt checking BP at home and in the 130s of 80s. No CP, palpitations, headaches or vision changes.   .. Active Ambulatory Problems    Diagnosis Date Noted  . Acquired hypothyroidism 08/11/2014  . Essential hypertension 08/11/2014  . Hyperlipidemia 08/11/2014  . Peripheral neuropathy ([place]) 08/11/2014  . Non-small cell lung cancer ([place]) 08/11/2014  . Shoulder impingement 02/24/2015  . Hyperglycemia 04/14/2015  . Prediabetes 04/15/2015  . Dermatitis 10/18/2015  . Cyst of pharynx 01/12/2016  . Aortic atherosclerosis ([place]) 01/12/2016  . Elevated serum creatinine 02/10/2016  . Fatigue 11/04/2016  . Stress at work 11/04/2016  . Adhesive capsulitis 12/27/2016  . Nausea 04/20/2017  . Epigastric pain 04/20/2017  . PUD (peptic ulcer disease) 04/20/2017  . Metatarsal stress fracture of right foot 01/15/2018  . 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 Chronic pain syndrome 05/29/2018   Resolved Ambulatory Problems    Diagnosis Date Noted  . Acute bronchitis 01/08/2015   Past Medical History:  Diagnosis Date  . Hypertension   . Lung cancer ([place])   . Thyroid disease    .[name] Kitchen Family History  Problem Relation Age of Onset  . Heart attack Father   . Hyperlipidemia Father   . Hypertension Father   . Diabetes Unknown        grandmother      Review of Systems  All other systems reviewed and are negative.      Objective:   Physical Exam BP (!) 155/82   Pulse 94   Ht 5\' 8"  (1.727 m)   Wt 181 lb (82.1 kg)   SpO2 97%   BMI 27.52 kg/m   General Appearance:     Alert, cooperative, no distress, appears stated age  Head:    Normocephalic, without obvious abnormality, atraumatic  Eyes:    PERRL, conjunctiva/corneas clear, EOM's intact, fundi    benign, both eyes       Ears:    Normal TM's and external ear canals, both ears  Nose:   Nares normal, septum midline, mucosa normal, no drainage    or sinus tenderness  Throat:   Lips, mucosa, and tongue normal; teeth and gums normal  Neck:   Supple, symmetrical, trachea midline, no adenopathy;       thyroid:  No enlargement/tenderness/nodules; no carotid   bruit or JVD  Back:     Symmetric, no curvature, ROM normal, no CVA tenderness  Lungs:     Clear to auscultation bilaterally except for absent right lung sounds from resected lung, respirations unlabored  Chest wall:    No tenderness or deformity  Heart:    Regular rate and rhythm, S1 and S2 normal, no murmur, rub   or gallop  Abdomen:     Soft, non-tender, bowel sounds active all four quadrants,    no masses, no organomegaly        Extremities:   Extremities normal, atraumatic, no cyanosis or edema  Pulses:   2+ and symmetric all extremities  Skin:  Skin color, texture, turgor normal, no rashes or lesions  Lymph nodes:   Cervical, supraclavicular, and axillary nodes normal  Neurologic:   CNII-XII intact. Normal strength, sensation and reflexes      throughout         Assessment & Plan:  [name] KitchenGianpaolo was seen today for annual exam.  Diagnoses and all orders for this visit:  Routine physical examination -     Hemoglobin A1c -     PSA -     Lipid Panel w/reflex Direct LDL -     COMPLETE METABOLIC PANEL WITH GFR  Essential hypertension -     valsartan-hydrochlorothiazide (DIOVAN-HCT) 320-25 MG tablet; Take 1 tablet by mouth daily. -     Cancel: COMPLETE METABOLIC PANEL WITH GFR -     Hemoglobin A1c -     PSA -     Lipid Panel w/reflex Direct LDL -     COMPLETE METABOLIC PANEL WITH GFR  Peripheral polyneuropathy -     gabapentin  (NEURONTIN) 100 MG capsule; TAKE ONE TO THREE CAPSULES BY MOUTH THREE TIMES DAILY -     Hemoglobin A1c -     PSA -     Lipid Panel w/reflex Direct LDL -     COMPLETE METABOLIC PANEL WITH GFR  Mixed hyperlipidemia -     atorvastatin (LIPITOR) 40 MG tablet; Take 1 tablet (40 mg total) by mouth daily. -     Cancel: Lipid Panel w/reflex Direct LDL -     Hemoglobin A1c -     PSA -     Lipid Panel w/reflex Direct LDL -     COMPLETE METABOLIC PANEL WITH GFR  Acquired hypothyroidism -     levothyroxine (SYNTHROID) 150 MCG tablet; Take 1 tablet (150 mcg total) by mouth daily before breakfast. -     Cancel: TSH -     Hemoglobin A1c -     PSA -     Lipid Panel w/reflex Direct LDL -     COMPLETE METABOLIC PANEL WITH GFR  Pre-diabetes -     Cancel: Hemoglobin A1c -     Hemoglobin A1c -     PSA -     Lipid Panel w/reflex Direct LDL -     COMPLETE METABOLIC PANEL WITH GFR  Prostate cancer screening -     Cancel: PSA -     Hemoglobin A1c -     PSA -     Lipid Panel w/reflex Direct LDL -     COMPLETE METABOLIC PANEL WITH GFR   .[name] Kitchen Depression screen [facility] 2/9 11/14/2018 01/15/2018 12/18/2017 12/04/2017 04/20/2017  Decreased Interest 0 0 0 0 0  Down, Depressed, Hopeless 0 0 0 0 0  PHQ - 2 Score 0 0 0 0 0  Altered sleeping 0 - 0 0 -  Tired, decreased energy 0 - 1 0 -  Change in appetite 0 - 0 0 -  Feeling bad or failure about yourself  0 - 0 0 -  Trouble concentrating 0 - 0 0 -  Moving slowly or fidgety/restless 0 - 0 0 -  Suicidal thoughts 0 - 0 0 -  PHQ-9 Score 0 - 1 0 -  Difficult doing work/chores Not difficult at all - Not difficult at all Not difficult at all -   .. [place] Name 11/14/18 0900         During the last Month   Sensation of Bladder not Empty  Not at all     Urinate<2 hours after last  Not at all     Mult. stop/start when voiding  Not at all     Difficult to postpone voiding  Not at all     Weak urinary stream  Not at all     Push/strain to begin  urination  Not at all     Times per night up to urinate  Less than 1 time in 5       OTHER   Total Score  1        .[name] KitchenStart a regular exercise program and make sure you are eating a healthy diet Try to eat 4 servings of dairy a day or take a calcium supplement (500mg  twice a day). Your vaccines are up to date.  shingrix discussed to get at pharmacy.  Fasting labs ordered.  Colonoscopy UTD.  PSA screening done.   BP up today. Reported much better home readings. Keep log at home and report back in 2 weeks.   Pain contract UTD. Pt will call to confirm where to send the next dose. He is not quite due.  [name] KitchenPDMP reviewed during this encounter. Follow up in 3 months.

## 2018-11-14 NOTE — Patient Instructions (Signed)
Health Maintenance After Age 69 After age 69, you are at a higher risk for certain long-term diseases and infections as well as injuries from falls. Falls are a major cause of broken bones and head injuries in people who are older than age 69. Getting regular preventive care can help to keep you healthy and well. Preventive care includes getting regular testing and making lifestyle changes as recommended by your health care provider. Talk with your health care provider about:  Which screenings and tests you should have. A screening is a test that checks for a disease when you have no symptoms.  A diet and exercise plan that is right for you. What should I know about screenings and tests to prevent falls? Screening and testing are the best ways to find a health problem early. Early diagnosis and treatment give you the best chance of managing medical conditions that are common after age 69. Certain conditions and lifestyle choices may make you more likely to have a fall. Your health care provider may recommend:  Regular vision checks. Poor vision and conditions such as cataracts can make you more likely to have a fall. If you wear glasses, make sure to get your prescription updated if your vision changes.  Medicine review. Work with your health care provider to regularly review all of the medicines you are taking, including over-the-counter medicines. Ask your health care provider about any side effects that may make you more likely to have a fall. Tell your health care provider if any medicines that you take make you feel dizzy or sleepy.  Osteoporosis screening. Osteoporosis is a condition that causes the bones to get weaker. This can make the bones weak and cause them to break more easily.  Blood pressure screening. Blood pressure changes and medicines to control blood pressure can make you feel dizzy.  Strength and balance checks. Your health care provider may recommend certain tests to check your  strength and balance while standing, walking, or changing positions.  Foot health exam. Foot pain and numbness, as well as not wearing proper footwear, can make you more likely to have a fall.  Depression screening. You may be more likely to have a fall if you have a fear of falling, feel emotionally low, or feel unable to do activities that you used to do.  Alcohol use screening. Using too much alcohol can affect your balance and may make you more likely to have a fall. What actions can I take to lower my risk of falls? General instructions  Talk with your health care provider about your risks for falling. Tell your health care provider if: ? You fall. Be sure to tell your health care provider about all falls, even ones that seem minor. ? You feel dizzy, sleepy, or off-balance.  Take over-the-counter and prescription medicines only as told by your health care provider. These include any supplements.  Eat a healthy diet and maintain a healthy weight. A healthy diet includes low-fat dairy products, low-fat (lean) meats, and fiber from whole grains, beans, and lots of fruits and vegetables. Home safety  Remove any tripping hazards, such as rugs, cords, and clutter.  Install safety equipment such as grab bars in bathrooms and safety rails on stairs.  Keep rooms and walkways well-lit. Activity   Follow a regular exercise program to stay fit. This will help you maintain your balance. Ask your health care provider what types of exercise are appropriate for you.  If you need a cane or   walker, use it as recommended by your health care provider.  Wear supportive shoes that have nonskid soles. Lifestyle  Do not drink alcohol if your health care provider tells you not to drink.  If you drink alcohol, limit how much you have: ? 0-1 drink a day for women. ? 0-2 drinks a day for men.  Be aware of how much alcohol is in your drink. In the [place] one drink equals one typical bottle of beer (12  oz), one-half glass of wine (5 oz), or one shot of hard liquor (1 oz).  Do not use any products that contain nicotine or tobacco, such as cigarettes and e-cigarettes. If you need help quitting, ask your health care provider. Summary  Having a healthy lifestyle and getting preventive care can help to protect your health and wellness after age 69.  Screening and testing are the best way to find a health problem early and help you avoid having a fall. Early diagnosis and treatment give you the best chance for managing medical conditions that are more common for people who are older than age 69.  Falls are a major cause of broken bones and head injuries in people who are older than age 69. Take precautions to prevent a fall at home.  Work with your health care provider to learn what changes you can make to improve your health and wellness and to prevent falls. This information is not intended to replace advice given to you by your health care provider. Make sure you discuss any questions you have with your health care provider. Document Released: 11/09/2016 Document Revised: 04/19/2018 Document Reviewed: 11/09/2016 Elsevier Patient Education  2020 Elsevier Inc.  

## 2018-11-15 LAB — COMPLETE METABOLIC PANEL WITH GFR
AG Ratio: 1.2 (calc) (ref 1.0–2.5)
ALT: 62 U/L — ABNORMAL HIGH (ref 9–46)
AST: 79 U/L — ABNORMAL HIGH (ref 10–35)
Albumin: 4.1 g/dL (ref 3.6–5.1)
Alkaline phosphatase (APISO): 206 U/L — ABNORMAL HIGH (ref 35–144)
BUN: 21 mg/dL (ref 7–25)
CO2: 26 mmol/L (ref 20–32)
Calcium: 9.6 mg/dL (ref 8.6–10.3)
Chloride: 103 mmol/L (ref 98–110)
Creat: 1.19 mg/dL (ref 0.70–1.25)
GFR, Est African American: 72 mL/min/{1.73_m2} (ref >=60)
GFR, Est Non African American: 62 mL/min/{1.73_m2} (ref >=60)
Globulin: 3.5 g/dL (calc) (ref 1.9–3.7)
Glucose, Bld: 88 mg/dL (ref 65–99)
Potassium: 4.4 mmol/L (ref 3.5–5.3)
Sodium: 139 mmol/L (ref 135–146)
Total Bilirubin: 0.6 mg/dL (ref 0.2–1.2)
Total Protein: 7.6 g/dL (ref 6.1–8.1)

## 2018-11-15 LAB — LIPID PANEL W/REFLEX DIRECT LDL
Cholesterol: 206 mg/dL — ABNORMAL HIGH (ref <200)
HDL: 122 mg/dL (ref >=40)
LDL Cholesterol (Calc): 68 mg/dL (calc)
Non-HDL Cholesterol (Calc): 84 mg/dL (calc) (ref <130)
Total CHOL/HDL Ratio: 1.7 (calc) (ref <5.0)
Triglycerides: 80 mg/dL (ref <150)

## 2018-11-15 LAB — HEMOGLOBIN A1C
Hgb A1c MFr Bld: 5.2 % of total Hgb (ref <5.7)
Mean Plasma Glucose: 103 (calc)
eAG (mmol/L): 5.7 (calc)

## 2018-11-15 LAB — PSA: PSA: 0.8 ng/mL (ref <=4.0)

## 2018-11-15 NOTE — Progress Notes (Signed)
Call pt: A1C is great. Normal PSA. Great cholesterol. Kidney looks great. Your liver enzymes have doubled. Have you been doing anything different? Taking any new supplements? Drinking alcohol? Recheck in 2 weeks.

## 2018-11-16 ENCOUNTER — Encounter: Payer: Self-pay | Admitting: Physician Assistant

## 2018-11-30 ENCOUNTER — Ambulatory Visit: Payer: Medicare Other | Admitting: Physician Assistant

## 2018-12-03 ENCOUNTER — Other Ambulatory Visit: Payer: Self-pay | Admitting: Physician Assistant

## 2018-12-03 DIAGNOSIS — G894 Chronic pain syndrome: Secondary | ICD-10-CM

## 2018-12-03 DIAGNOSIS — G629 Polyneuropathy, unspecified: Secondary | ICD-10-CM

## 2018-12-19 NOTE — Progress Notes (Signed)
Subjective:   Walter Wilkerson is a 68 y.o. male who presents for an Initial Medicare Annual Wellness Visit.  Review of Systems  No ROS.  Medicare Wellness Virtual Visit.  Visual/audio telehealth visit, UTA vital signs.   See social history for additional risk factors.    Cardiac Risk Factors include: advanced age (>63men, >61 women);male gender;hypertension  Sleep patterns:Getting 7-8 hours of sleep a night. Wakes up 1 time a night to go to the bathroom. Wakes up and feels rested  Home Safety/Smoke Alarms: Feels safe in home. Smoke alarms in place.  Living environment; Lives with wife in 2 story home. Stairs have hand rails on them. Shower is a walk in shower and bench in place. Seat Belt Safety/Bike Helmet: Wears seat belt.    Male:   CCS- UTD    PSA- UTD Lab Results  Component Value Date   PSA 0.8 11/14/2018   PSA 0.74 04/13/2015       Objective:    There were no vitals filed for this visit. There is no height or weight on file to calculate BMI.  Advanced Directives 12/24/2018 12/18/2017 12/30/2016 08/11/2014  Does Patient Have a Medical Advance Directive? Yes Yes Yes Yes  Type of Advance Directive Living will [place];Living will Living will Living will  Does patient want to make changes to medical advance directive? No - Patient declined No - Patient declined - No - Patient declined  Copy of [place] in Chart? - No - copy requested - -    Current Medications (verified) Outpatient Encounter Medications as of 12/24/2018  Medication Sig  . aspirin EC 81 MG tablet Take 81 mg by mouth daily.  [name] Kitchen atorvastatin (LIPITOR) 40 MG tablet Take 1 tablet (40 mg total) by mouth daily.  . diclofenac sodium (VOLTAREN) 1 % GEL Apply 4 g topically 4 (four) times daily.  [name] Kitchen gabapentin (NEURONTIN) 100 MG capsule TAKE ONE TO THREE CAPSULES BY MOUTH THREE TIMES DAILY  . HYDROcodone-acetaminophen (NORCO) 10-325 MG tablet Take 1 tablet by mouth every 6  (six) hours as needed.  [name] Kitchen levothyroxine (SYNTHROID) 150 MCG tablet Take 1 tablet (150 mcg total) by mouth daily before breakfast.  . Multiple Vitamin (MULTIVITAMIN) tablet Take 1 tablet by mouth daily.  . valsartan-hydrochlorothiazide (DIOVAN-HCT) 320-25 MG tablet Take 1 tablet by mouth daily.  . [DISCONTINUED] HYDROcodone-acetaminophen (NORCO) 10-325 MG tablet Take 1 tablet by mouth every 6 (six) hours as needed.  . [DISCONTINUED] HYDROcodone-acetaminophen (NORCO) 10-325 MG tablet Take 1 tablet by mouth every 6 (six) hours as needed.   No facility-administered encounter medications on file as of 12/24/2018.    Allergies (verified) Lipitor [atorvastatin calcium], Lisinopril, and Pregabalin   History: Past Medical History:  Diagnosis Date  . Hypertension   . Lung cancer ([place])   . Thyroid disease    Past Surgical History:  Procedure Laterality Date  . LUNG LOBECTOMY Right 2008   RLL resection  . VASECTOMY     Family History  Problem Relation Age of Onset  . Heart attack Father   . Hyperlipidemia Father   . Hypertension Father   . Heart disease Father   . Diabetes Other        grandmother    Social History   Socioeconomic History  . Marital status: Married    Spouse name: [name]  . Number of children: 1  . Years of education: 57  . Highest education level: 12th grade  Occupational History  . Occupation:  [employer]    Comment: part time  Tobacco Use  . Smoking status: Former Smoker    Quit date: 08/10/2004    Years since quitting: 14.3  . Smokeless tobacco: Never Used  Substance and Sexual Activity  . Alcohol use: Not Currently    Alcohol/week: 0.0 standard drinks  . Drug use: No  . Sexual activity: Not Currently    Partners: Female  Other Topics Concern  . Not on file  Social History Narrative    Exercises at gym 3 times a week walking and weights. Drinks 3-4 coffee a day. 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   Social Determinants of Health    Financial Resource Strain:   . Difficulty of Paying Living Expenses: Not on file  Food Insecurity:   . Worried About Charity fundraiser in the Last Year: Not on file  . Ran Out of Food in the Last Year: Not on file  Transportation Needs:   . Lack of Transportation (Medical): Not on file  . Lack of Transportation (Non-Medical): Not on file  Physical Activity:   . Days of Exercise per Week: Not on file  . Minutes of Exercise per Session: Not on file  Stress:   . Feeling of Stress : Not on file  Social Connections:   . Frequency of Communication with Friends and Family: Not on file  . Frequency of Social Gatherings with Friends and Family: Not on file  . Attends Religious Services: Not on file  . Active Member of Clubs or Organizations: Not on file  . Attends Archivist Meetings: Not on file  . Marital Status: Not on file   Tobacco Counseling Counseling given: Not Answered   Clinical Intake:  Pre-visit preparation completed: Yes  Pain : No/denies pain     Nutritional Risks: None Diabetes: No  How often do you need to have someone help you when you read instructions, pamphlets, or other written materials from your doctor or pharmacy?: 1 - Never What is the last grade level you completed in school?: 12  Interpreter Needed?: No  Information entered by :: Orlie Dakin, LPN  Activities of Daily Living In your present state of health, do you have any difficulty performing the following activities: 12/24/2018  Hearing? Y  Comment does not hear as well as use to  Vision? N  Difficulty concentrating or making decisions? N  Walking or climbing stairs? N  Dressing or bathing? N  Doing errands, shopping? N  Preparing Food and eating ? N  Using the Toilet? N  In the past six months, have you accidently leaked urine? N  Do you have problems with loss of bowel control? N  Managing your Medications? N  Managing your Finances? N  Housekeeping or managing your  Housekeeping? N  Some recent data might be hidden     Immunizations and Health Maintenance Immunization History  Administered Date(s) Administered  . Influenza,inj,Quad PF,6+ Mos 01/09/2013, 11/11/2014, 10/16/2015, 11/04/2016, 08/29/2018  . Influenza-Unspecified 09/04/2017  . Pneumococcal Conjugate-13 04/13/2015  . Pneumococcal Polysaccharide-23 11/04/2016  . Tdap 09/22/2010   There are no preventive care reminders to display for this patient.  Patient Care Team: Lavada Mesi as PCP - General (Family Medicine)  Indicate any recent Medical Services you may have received from other than [facility] providers in the past year (date may be approximate).    Assessment:   This is a routine wellness examination for Walter Wilkerson.Physical assessment deferred to PCP.  Hearing/Vision screen No exam data present  Dietary issues and exercise activities discussed: Current Exercise Habits: Home exercise routine, Type of exercise: strength training/weights;walking;treadmill, Time (Minutes): 60, Frequency (Times/Week): 7, Weekly Exercise (Minutes/Week): 420, Intensity: Moderate, Exercise limited by: None identified Diet Eats a healthy diet of fruits, vegetables and proteins. Breakfast:skips Lunch: sandwich and chips or salad Dinner:  Meat and vegetables on the grill   Drinks 6-8 glasses of water daily    Lab Results  Component Value Date   PSA 0.8 11/14/2018   PSA 0.74 04/13/2015     Goals    . Exercise 3x per week (30 min per time)     Continue to exercise as you are doing and drinking plenty of water daily. Your doing a great job    . Patient Stated     Would like to be able to lift more weights at one time and continue exercising as he does. Doing a great job      Depression Screen PHQ 2/9 Scores 12/24/2018 11/14/2018 01/15/2018 12/18/2017  PHQ - 2 Score 0 0 0 0  PHQ- 9 Score 1 0 - 1    Fall Risk Fall Risk  12/24/2018 12/18/2017 09/02/2016  Falls in the past year? 0 0 No  Number  falls in past yr: 0 - -  Injury with Fall? 0 - -  Follow up Falls prevention discussed - -    Is the patient's home free of loose throw rugs in walkways, pet beds, electrical cords, etc?   yes      Grab bars in the bathroom? no      Handrails on the stairs?   yes      Adequate lighting?   yes   Cognitive Function:     6CIT Screen 12/24/2018 12/18/2017  What Year? 0 points 0 points  What month? 0 points 0 points  What time? 0 points 0 points  Count back from 20 0 points 0 points  Months in reverse 0 points 0 points  Repeat phrase 0 points 0 points  Total Score 0 0    Screening Tests Health Maintenance  Topic Date Due  . COLONOSCOPY  01/11/2020  . TETANUS/TDAP  09/21/2020  . INFLUENZA VACCINE  Completed  . Hepatitis C Screening  Completed  . PNA vac Low Risk Adult  Completed        Plan:    Please schedule your next medicare wellness visit with me in 1 yr.  Mr. Shealy , Thank you for taking time to come for your Medicare Wellness Visit. I appreciate your ongoing commitment to your health goals. Please review the following plan we discussed and let me know if I can assist you in the future.  Continue doing brain stimulating activities (puzzles, reading, adult coloring books, staying active) to keep memory sharp.    These are the goals we discussed: Goals    . Exercise 3x per week (30 min per time)     Continue to exercise as you are doing and drinking plenty of water daily. Your doing a great job    . Patient Stated     Would like to be able to lift more weights at one time and continue exercising as he does. Doing a great job       This is a list of the screening recommended for you and due dates:  Health Maintenance  Topic Date Due  . Colon Cancer Screening  01/11/2020  . Tetanus Vaccine  09/21/2020  .  Flu Shot  Completed  .  Hepatitis C: One time screening is recommended by Center for Disease Control  (CDC) for  adults born from [birth year] through 1966.    Completed  . Pneumonia vaccines  Completed     I have personally reviewed and noted the following in the patient's chart:   . Medical and social history . Use of alcohol, tobacco or illicit drugs  . Current medications and supplements . Functional ability and status . Nutritional status . Physical activity . Advanced directives . List of other physicians . Hospitalizations, surgeries, and ER visits in previous 12 months . Vitals . Screenings to include cognitive, depression, and falls . Referrals and appointments  In addition, I have reviewed and discussed with patient certain preventive protocols, quality metrics, and best practice recommendations. A written personalized care plan for preventive services as well as general preventive health recommendations were provided to patient.     Joanne Chars, LPN   22/41/1464

## 2018-12-24 ENCOUNTER — Ambulatory Visit (INDEPENDENT_AMBULATORY_CARE_PROVIDER_SITE_OTHER): Payer: Medicare Other | Admitting: *Deleted

## 2018-12-24 VITALS — Ht 68.0 in | Wt 183.0 lb

## 2018-12-24 DIAGNOSIS — Z0000 Encounter for general adult medical examination without abnormal findings: Secondary | ICD-10-CM

## 2018-12-24 NOTE — Patient Instructions (Addendum)
Please schedule your next medicare wellness visit with me in 1 yr.  Walter Wilkerson , Thank you for taking time to come for your Medicare Wellness Visit. I appreciate your ongoing commitment to your health goals. Please review the following plan we discussed and let me know if I can assist you in the future.  Continue doing brain stimulating activities (puzzles, reading, adult coloring books, staying active) to keep memory sharp.  These are the goals we discussed: Goals    . Exercise 3x per week (30 min per time)     Continue to exercise as you are doing and drinking plenty of water daily. Your doing a great job    . Patient Stated     Would like to be able to lift more weights at one time and continue exercising as he does. Doing a great job

## 2019-01-07 ENCOUNTER — Other Ambulatory Visit: Payer: Self-pay | Admitting: Physician Assistant

## 2019-01-07 DIAGNOSIS — G629 Polyneuropathy, unspecified: Secondary | ICD-10-CM

## 2019-01-07 DIAGNOSIS — G894 Chronic pain syndrome: Secondary | ICD-10-CM

## 2019-02-02 ENCOUNTER — Other Ambulatory Visit: Payer: Self-pay | Admitting: Physician Assistant

## 2019-02-02 DIAGNOSIS — G629 Polyneuropathy, unspecified: Secondary | ICD-10-CM

## 2019-02-07 ENCOUNTER — Other Ambulatory Visit: Payer: Self-pay | Admitting: Physician Assistant

## 2019-02-07 DIAGNOSIS — G629 Polyneuropathy, unspecified: Secondary | ICD-10-CM

## 2019-02-08 ENCOUNTER — Other Ambulatory Visit: Payer: Self-pay | Admitting: Physician Assistant

## 2019-02-08 DIAGNOSIS — G629 Polyneuropathy, unspecified: Secondary | ICD-10-CM

## 2019-02-08 DIAGNOSIS — G894 Chronic pain syndrome: Secondary | ICD-10-CM

## 2019-02-08 MED ORDER — GABAPENTIN 300 MG PO CAPS: 300.0000 mg | ORAL_CAPSULE | Freq: Three times a day (TID) | ORAL | 1 refills | Status: DC

## 2019-02-08 NOTE — Telephone Encounter (Signed)
Gabapentin refill denied. Received call from pharmacy stating they had to combine his refill to make a 90 day supply.  This was last written for #540 with 1 refill on 11/14/2018. It is written for up to 3 tablets TID. This would be 810 for 90 day supply, still seems like he is taking too much. Please advise.

## 2019-02-08 NOTE — Telephone Encounter (Signed)
Spoke with patient and he states he is taking 3 tablets TID. Changed to 300 mg tablets to take one TID. He is aware. RX sent. [place].

## 2019-02-08 NOTE — Addendum Note (Signed)
Addended byAnnamaria Helling on: 02/08/2019 02:59 PM   Modules accepted: Orders

## 2019-02-08 NOTE — Telephone Encounter (Signed)
Call to see exactly how he is taking. If taking a larger mg we could increase the dose to decrease pill count. Like 300mg  tablet three times a day.

## 2019-02-08 NOTE — Telephone Encounter (Signed)
Last filled 01/08/2019 #120 no refills  Last appt 11/14/2018

## 2019-02-18 ENCOUNTER — Ambulatory Visit (INDEPENDENT_AMBULATORY_CARE_PROVIDER_SITE_OTHER): Payer: Medicare Other | Admitting: Physician Assistant

## 2019-02-18 DIAGNOSIS — Z5329 Procedure and treatment not carried out because of patient's decision for other reasons: Secondary | ICD-10-CM

## 2019-02-18 NOTE — Progress Notes (Signed)
No show

## 2019-02-20 ENCOUNTER — Ambulatory Visit: Payer: Medicare Other | Admitting: Physician Assistant

## 2019-02-25 ENCOUNTER — Encounter: Payer: Self-pay | Admitting: Physician Assistant

## 2019-02-25 ENCOUNTER — Other Ambulatory Visit: Payer: Self-pay

## 2019-02-25 ENCOUNTER — Ambulatory Visit (INDEPENDENT_AMBULATORY_CARE_PROVIDER_SITE_OTHER): Payer: Medicare Other | Admitting: Physician Assistant

## 2019-02-25 DIAGNOSIS — G629 Polyneuropathy, unspecified: Secondary | ICD-10-CM

## 2019-02-25 DIAGNOSIS — I10 Essential (primary) hypertension: Secondary | ICD-10-CM | POA: Diagnosis not present

## 2019-02-25 DIAGNOSIS — G894 Chronic pain syndrome: Secondary | ICD-10-CM | POA: Diagnosis not present

## 2019-02-25 MED ORDER — VALSARTAN-HYDROCHLOROTHIAZIDE 320-25 MG PO TABS: 1.0000 | ORAL_TABLET | Freq: Every day | ORAL | 1 refills | Status: DC

## 2019-02-25 MED ORDER — HYDROCODONE-ACETAMINOPHEN 10-325 MG PO TABS: 1.0000 | ORAL_TABLET | Freq: Four times a day (QID) | ORAL | 0 refills | Status: DC | PRN

## 2019-02-25 NOTE — Progress Notes (Signed)
BP home readings:  Thursday 02/14/2019  154/80 (am) 150/77 (pm)  Friday 02/15/2019 147/86 (am)  Saturday 02/16/2019 148/81 (am-after gym)  Sunday 02/17/2019 162/85 (am)  Monday 02/18/2019 129/73 (am after gym)  Tuesday 02/19/2019 146/75 (am after gym)

## 2019-02-25 NOTE — Patient Instructions (Signed)
COVID-19 Vaccine Information can be found at: [email or web address] For questions related to vaccine distribution or appointments, please email [email or web address] or call [phone].    

## 2019-02-25 NOTE — Progress Notes (Signed)
   Subjective:    Patient ID: Walter Wilkerson, male    DOB: 03/31/1950, 69 y.o.   MRN: [id number]  HPI Pt is a 69 yo male with chronic pain and HTN follow up. He is doing well. No problems or concerns.   BP home readings:  Thursday 02/14/2019  154/80 (am) 150/77 (pm)  Friday 02/15/2019 147/86 (am)  Saturday 02/16/2019 148/81 (am-after gym)  Sunday 02/17/2019 162/85 (am)  Monday 02/18/2019 129/73 (am after gym)  Tuesday 02/19/2019 146/75 (am after gym)  Most days BP really good. Pt denies any headache, vision changes, palpitations.   Pt is controlled on current dose. No concerns or compliants.   .. Active Ambulatory Problems    Diagnosis Date Noted  . Acquired hypothyroidism 08/11/2014  . Essential hypertension 08/11/2014  . Hyperlipidemia 08/11/2014  . Peripheral neuropathy ([place]) 08/11/2014  . Non-small cell lung cancer ([place]) 08/11/2014  . Shoulder impingement 02/24/2015  . Hyperglycemia 04/14/2015  . Prediabetes 04/15/2015  . Dermatitis 10/18/2015  . Cyst of pharynx 01/12/2016  . Aortic atherosclerosis ([place]) 01/12/2016  . Elevated serum creatinine 02/10/2016  . Fatigue 11/04/2016  . Stress at work 11/04/2016  . Adhesive capsulitis 12/27/2016  . Nausea 04/20/2017  . Epigastric pain 04/20/2017  . PUD (peptic ulcer disease) 04/20/2017  . Metatarsal stress fracture of right foot 01/15/2018  . 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 Chronic pain syndrome 05/29/2018   Resolved Ambulatory Problems    Diagnosis Date Noted  . Acute bronchitis 01/08/2015   Past Medical History:  Diagnosis Date  . Hypertension   . Lung cancer ([place])   . Thyroid disease      Review of Systems  All other systems reviewed and are negative.      Objective:   Physical Exam Vitals reviewed.  Constitutional:      Appearance: Normal appearance.  Cardiovascular:     Rate and Rhythm: Normal rate and regular rhythm.     Pulses: Normal pulses.  Pulmonary:     Effort:  Pulmonary effort is normal.     Breath sounds: Normal breath sounds.  Neurological:     General: No focal deficit present.     Mental Status: He is alert and oriented to person, place, and time.  Psychiatric:        Mood and Affect: Mood normal.           Assessment & Plan:  [name] KitchenCurley was seen today for follow-up.  Diagnoses and all orders for this visit:  Essential hypertension -     valsartan-hydrochlorothiazide (DIOVAN-HCT) 320-25 MG tablet; Take 1 tablet by mouth daily.  Peripheral polyneuropathy -     HYDROcodone-acetaminophen (NORCO) 10-325 MG tablet; Take 1 tablet by mouth every 6 (six) hours as needed.  Chronic pain syndrome -     HYDROcodone-acetaminophen (NORCO) 10-325 MG tablet; Take 1 tablet by mouth every 6 (six) hours as needed.   Most BP to goal at under 150/90 for age. Continue on same medication.  Continue low salt diet and exercise.  Follow up as needed.   [name] KitchenPDMP reviewed during this encounter. Post dated for next refill.  Pain contract UTD.  Follow up in 3 months.

## 2019-04-12 ENCOUNTER — Other Ambulatory Visit: Payer: Self-pay | Admitting: Physician Assistant

## 2019-04-12 DIAGNOSIS — G629 Polyneuropathy, unspecified: Secondary | ICD-10-CM

## 2019-04-12 DIAGNOSIS — G894 Chronic pain syndrome: Secondary | ICD-10-CM

## 2019-04-15 MED ORDER — HYDROCODONE-ACETAMINOPHEN 10-325 MG PO TABS: 1.0000 | ORAL_TABLET | Freq: Four times a day (QID) | ORAL | 0 refills | Status: DC | PRN

## 2019-05-24 ENCOUNTER — Ambulatory Visit: Payer: Medicare Other | Admitting: Physician Assistant

## 2019-06-03 ENCOUNTER — Other Ambulatory Visit: Payer: Self-pay

## 2019-06-03 ENCOUNTER — Ambulatory Visit (INDEPENDENT_AMBULATORY_CARE_PROVIDER_SITE_OTHER): Payer: Medicare Other | Admitting: Physician Assistant

## 2019-06-03 ENCOUNTER — Encounter: Payer: Self-pay | Admitting: Physician Assistant

## 2019-06-03 VITALS — BP 140/77 | HR 70 | Ht 68.0 in | Wt 179.0 lb

## 2019-06-03 DIAGNOSIS — G894 Chronic pain syndrome: Secondary | ICD-10-CM | POA: Diagnosis not present

## 2019-06-03 DIAGNOSIS — G629 Polyneuropathy, unspecified: Secondary | ICD-10-CM | POA: Diagnosis not present

## 2019-06-03 MED ORDER — HYDROCODONE-ACETAMINOPHEN 10-325 MG PO TABS: 1.0000 | ORAL_TABLET | Freq: Four times a day (QID) | ORAL | 0 refills | Status: DC | PRN

## 2019-06-03 NOTE — Progress Notes (Signed)
   Subjective:    Patient ID: Walter Wilkerson, male    DOB: [birth year], 69 y.o.   MRN: [id number]  HPI  Pt is a 69 yo male with chronic pain, HLD, hypothyroidism, HTN who presents to the clinic for 3 month follow up.   He is doing well and pain is controlled. He is walking 4 miles a day. He is very active.   He has no concerns or complaints with health.  He did get a no show bill for 2/8. He states he called and canceled. He would like for [place] to get rid of bill.          .. Active Ambulatory Problems    Diagnosis Date Noted  . Acquired hypothyroidism 08/11/2014  . Essential hypertension 08/11/2014  . Hyperlipidemia 08/11/2014  . Peripheral neuropathy ([place]) 08/11/2014  . Non-small cell lung cancer ([place]) 08/11/2014  . Shoulder impingement 02/24/2015  . Hyperglycemia 04/14/2015  . Prediabetes 04/15/2015  . Dermatitis 10/18/2015  . Cyst of pharynx 01/12/2016  . Aortic atherosclerosis ([place]) 01/12/2016  . Elevated serum creatinine 02/10/2016  . Fatigue 11/04/2016  . Stress at work 11/04/2016  . Adhesive capsulitis 12/27/2016  . Nausea 04/20/2017  . Epigastric pain 04/20/2017  . PUD (peptic ulcer disease) 04/20/2017  . Metatarsal stress fracture of right foot 01/15/2018  . 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 Chronic pain syndrome 05/29/2018   Resolved Ambulatory Problems    Diagnosis Date Noted  . Acute bronchitis 01/08/2015   Past Medical History:  Diagnosis Date  . Hypertension   . Lung cancer ([place])   . Thyroid disease                                      Review of Systems  All other systems reviewed and are negative.     Objective:   Physical Exam Vitals reviewed.  Constitutional:      Appearance: Normal appearance.  HENT:     Head: Normocephalic.  Neck:     Vascular: No carotid bruit.  Cardiovascular:     Rate and Rhythm: Normal rate and regular rhythm.     Pulses: Normal pulses.     Heart sounds: Normal heart sounds.   Pulmonary:     Effort: Pulmonary effort is normal.  Neurological:     General: No focal deficit present.     Mental Status: He is alert and oriented to person, place, and time.  Psychiatric:        Mood and Affect: Mood normal.           Assessment & Plan:  [name] KitchenJuvon was seen today for pain.  Diagnoses and all orders for this visit:  Chronic pain syndrome  Peripheral polyneuropathy   Pain contract UTD 08/2018 .[name] KitchenPDMP reviewed during this encounter. UDS UTD.  Labs UTD.  Refilled for 3 months.

## 2019-08-30 ENCOUNTER — Other Ambulatory Visit: Payer: Self-pay | Admitting: Physician Assistant

## 2019-09-03 ENCOUNTER — Other Ambulatory Visit: Payer: Self-pay

## 2019-09-03 ENCOUNTER — Ambulatory Visit (INDEPENDENT_AMBULATORY_CARE_PROVIDER_SITE_OTHER): Payer: Medicare Other | Admitting: Physician Assistant

## 2019-09-03 ENCOUNTER — Encounter: Payer: Self-pay | Admitting: Physician Assistant

## 2019-09-03 ENCOUNTER — Ambulatory Visit (INDEPENDENT_AMBULATORY_CARE_PROVIDER_SITE_OTHER): Payer: Medicare Other

## 2019-09-03 VITALS — BP 150/77 | HR 67 | Temp 98.3°F | Wt 176.1 lb

## 2019-09-03 DIAGNOSIS — R252 Cramp and spasm: Secondary | ICD-10-CM | POA: Insufficient documentation

## 2019-09-03 DIAGNOSIS — S8991XA Unspecified injury of right lower leg, initial encounter: Secondary | ICD-10-CM

## 2019-09-03 DIAGNOSIS — R748 Abnormal levels of other serum enzymes: Secondary | ICD-10-CM | POA: Insufficient documentation

## 2019-09-03 DIAGNOSIS — I10 Essential (primary) hypertension: Secondary | ICD-10-CM

## 2019-09-03 DIAGNOSIS — G894 Chronic pain syndrome: Secondary | ICD-10-CM

## 2019-09-03 DIAGNOSIS — G629 Polyneuropathy, unspecified: Secondary | ICD-10-CM | POA: Diagnosis not present

## 2019-09-03 MED ORDER — MELOXICAM 15 MG PO TABS: 15.0000 mg | ORAL_TABLET | Freq: Every day | ORAL | 0 refills | Status: DC

## 2019-09-03 MED ORDER — HYDROCODONE-ACETAMINOPHEN 10-325 MG PO TABS: 1.0000 | ORAL_TABLET | Freq: Four times a day (QID) | ORAL | 0 refills | Status: DC | PRN

## 2019-09-03 MED ORDER — VALSARTAN-HYDROCHLOROTHIAZIDE 320-25 MG PO TABS: 1.0000 | ORAL_TABLET | Freq: Every day | ORAL | 1 refills | Status: DC

## 2019-09-03 NOTE — Patient Instructions (Signed)
Journal for Nurse Practitioners, 15(4), 263-267. Retrieved October 16, 2017 from [email or web address]">  Knee Exercises Ask your health care provider which exercises are safe for you. Do exercises exactly as told by your health care provider and adjust them as directed. It is normal to feel mild stretching, pulling, tightness, or discomfort as you do these exercises. Stop right away if you feel sudden pain or your pain gets worse. Do not begin these exercises until told by your health care provider. Stretching and range-of-motion exercises These exercises warm up your muscles and joints and improve the movement and flexibility of your knee. These exercises also help to relieve pain and swelling. Knee extension, prone 1. Lie on your abdomen (prone position) on a bed. 2. Place your left / right knee just beyond the edge of the surface so your knee is not on the bed. You can put a towel under your left / right thigh just above your kneecap for comfort. 3. Relax your leg muscles and allow gravity to straighten your knee (extension). You should feel a stretch behind your left / right knee. 4. Hold this position for __________ seconds. 5. Scoot up so your knee is supported between repetitions. Repeat __________ times. Complete this exercise __________ times a day. Knee flexion, active  1. Lie on your back with both legs straight. If this causes back discomfort, bend your left / right knee so your foot is flat on the floor. 2. Slowly slide your left / right heel back toward your buttocks. Stop when you feel a gentle stretch in the front of your knee or thigh (flexion). 3. Hold this position for __________ seconds. 4. Slowly slide your left / right heel back to the starting position. Repeat __________ times. Complete this exercise __________ times a day. Quadriceps stretch, prone  1. Lie on your abdomen on a firm surface, such as a bed or padded floor. 2. Bend your left / right knee and hold  your ankle. If you cannot reach your ankle or pant leg, loop a belt around your foot and grab the belt instead. 3. Gently pull your heel toward your buttocks. Your knee should not slide out to the side. You should feel a stretch in the front of your thigh and knee (quadriceps). 4. Hold this position for __________ seconds. Repeat __________ times. Complete this exercise __________ times a day. Hamstring, supine 1. Lie on your back (supine position). 2. Loop a belt or towel over the ball of your left / right foot. The ball of your foot is on the walking surface, right under your toes. 3. Straighten your left / right knee and slowly pull on the belt to raise your leg until you feel a gentle stretch behind your knee (hamstring). ? Do not let your knee bend while you do this. ? Keep your other leg flat on the floor. 4. Hold this position for __________ seconds. Repeat __________ times. Complete this exercise __________ times a day. Strengthening exercises These exercises build strength and endurance in your knee. Endurance is the ability to use your muscles for a long time, even after they get tired. Quadriceps, isometric This exercise stretches the muscles in front of your thigh (quadriceps) without moving your knee joint (isometric). 1. Lie on your back with your left / right leg extended and your other knee bent. Put a rolled towel or small pillow under your knee if told by your health care provider. 2. Slowly tense the muscles in the front of your left /   right thigh. You should see your kneecap slide up toward your hip or see increased dimpling just above the knee. This motion will push the back of the knee toward the floor. 3. For __________ seconds, hold the muscle as tight as you can without increasing your pain. 4. Relax the muscles slowly and completely. Repeat __________ times. Complete this exercise __________ times a day. Straight leg raises This exercise stretches the muscles in front  of your thigh (quadriceps) and the muscles that move your hips (hip flexors). 1. Lie on your back with your left / right leg extended and your other knee bent. 2. Tense the muscles in the front of your left / right thigh. You should see your kneecap slide up or see increased dimpling just above the knee. Your thigh may even shake a bit. 3. Keep these muscles tight as you raise your leg 4-6 inches (10-15 cm) off the floor. Do not let your knee bend. 4. Hold this position for __________ seconds. 5. Keep these muscles tense as you lower your leg. 6. Relax your muscles slowly and completely after each repetition. Repeat __________ times. Complete this exercise __________ times a day. Hamstring, isometric 1. Lie on your back on a firm surface. 2. Bend your left / right knee about __________ degrees. 3. Dig your left / right heel into the surface as if you are trying to pull it toward your buttocks. Tighten the muscles in the back of your thighs (hamstring) to "dig" as hard as you can without increasing any pain. 4. Hold this position for __________ seconds. 5. Release the tension gradually and allow your muscles to relax completely for __________ seconds after each repetition. Repeat __________ times. Complete this exercise __________ times a day. Hamstring curls If told by your health care provider, do this exercise while wearing ankle weights. Begin with __________ lb weights. Then increase the weight by 1 lb (0.5 kg) increments. Do not wear ankle weights that are more than __________ lb. 1. Lie on your abdomen with your legs straight. 2. Bend your left / right knee as far as you can without feeling pain. Keep your hips flat against the floor. 3. Hold this position for __________ seconds. 4. Slowly lower your leg to the starting position. Repeat __________ times. Complete this exercise __________ times a day. Squats This exercise strengthens the muscles in front of your thigh and knee  (quadriceps). 1. Stand in front of a table, with your feet and knees pointing straight ahead. You may rest your hands on the table for balance but not for support. 2. Slowly bend your knees and lower your hips like you are going to sit in a chair. ? Keep your weight over your heels, not over your toes. ? Keep your lower legs upright so they are parallel with the table legs. ? Do not let your hips go lower than your knees. ? Do not bend lower than told by your health care provider. ? If your knee pain increases, do not bend as low. 3. Hold the squat position for __________ seconds. 4. Slowly push with your legs to return to standing. Do not use your hands to pull yourself to standing. Repeat __________ times. Complete this exercise __________ times a day. Wall slides This exercise strengthens the muscles in front of your thigh and knee (quadriceps). 1. Lean your back against a smooth wall or door, and walk your feet out 18-24 inches (46-61 cm) from it. 2. Place your feet hip-width apart. 3.   Slowly slide down the wall or door until your knees bend __________ degrees. Keep your knees over your heels, not over your toes. Keep your knees in line with your hips. 4. Hold this position for __________ seconds. Repeat __________ times. Complete this exercise __________ times a day. Straight leg raises This exercise strengthens the muscles that rotate the leg at the hip and move it away from your body (hip abductors). 1. Lie on your side with your left / right leg in the top position. Lie so your head, shoulder, knee, and hip line up. You may bend your bottom knee to help you keep your balance. 2. Roll your hips slightly forward so your hips are stacked directly over each other and your left / right knee is facing forward. 3. Leading with your heel, lift your top leg 4-6 inches (10-15 cm). You should feel the muscles in your outer hip lifting. ? Do not let your foot drift forward. ? Do not let your knee  roll toward the ceiling. 4. Hold this position for __________ seconds. 5. Slowly return your leg to the starting position. 6. Let your muscles relax completely after each repetition. Repeat __________ times. Complete this exercise __________ times a day. Straight leg raises This exercise stretches the muscles that move your hips away from the front of the pelvis (hip extensors). 1. Lie on your abdomen on a firm surface. You can put a pillow under your hips if that is more comfortable. 2. Tense the muscles in your buttocks and lift your left / right leg about 4-6 inches (10-15 cm). Keep your knee straight as you lift your leg. 3. Hold this position for __________ seconds. 4. Slowly lower your leg to the starting position. 5. Let your leg relax completely after each repetition. Repeat __________ times. Complete this exercise __________ times a day. This information is not intended to replace advice given to you by your health care provider. Make sure you discuss any questions you have with your health care provider. Document Revised: 10/17/2017 Document Reviewed: 10/17/2017 Elsevier Patient Education  2020 [employer]  

## 2019-09-03 NOTE — Progress Notes (Signed)
Normal xray of right knee.

## 2019-09-03 NOTE — Progress Notes (Signed)
Subjective:    Patient ID: Walter Wilkerson, male    DOB: [birth year], 69 y.o.   MRN: [id number]  HPI  Pt is a 69 yo male with HTN, chronic pain, hypothyroidism, neuropathy, HDL who presents to the clinic for 3 month follow up.   Pt is doing well on norco for pain control. He does need to get the next dose refilled early due to going out of town on vacation.   Pt was helping a friend move last weekend when a piece of furniture fell on his right leg and caused it to twist. He has had pain and stiffness since. He has used heat but nothing else. Pain seems to be getting a little better but feels weak and worse with weight bearing and twisting at the knee. No prior knee injuries.   He is also having some muscle cramps and drawing of his left thumb at times. No reason or trigger. Not tried anything to make better. Does not hurt when not effected.   .. Active Ambulatory Problems    Diagnosis Date Noted  . Acquired hypothyroidism 08/11/2014  . Essential hypertension 08/11/2014  . Hyperlipidemia 08/11/2014  . Peripheral neuropathy ([place]) 08/11/2014  . Non-small cell lung cancer ([place]) 08/11/2014  . Shoulder impingement 02/24/2015  . Hyperglycemia 04/14/2015  . Prediabetes 04/15/2015  . Dermatitis 10/18/2015  . Cyst of pharynx 01/12/2016  . Aortic atherosclerosis ([place]) 01/12/2016  . Elevated serum creatinine 02/10/2016  . Fatigue 11/04/2016  . Stress at work 11/04/2016  . Adhesive capsulitis 12/27/2016  . Nausea 04/20/2017  . Epigastric pain 04/20/2017  . PUD (peptic ulcer disease) 04/20/2017  . Metatarsal stress fracture of right foot 01/15/2018  . 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 Chronic pain syndrome 05/29/2018  . Injury of right knee 09/03/2019  . Muscle cramps 09/03/2019  . Elevated liver enzymes 09/03/2019   Resolved Ambulatory Problems    Diagnosis Date Noted  . Acute bronchitis 01/08/2015   Past Medical History:  Diagnosis Date  . Hypertension   .  Lung cancer ([place])   . Thyroid disease         Review of Systems See HPI.     Objective:   Physical Exam Vitals reviewed.  Constitutional:      Appearance: Normal appearance.  HENT:     Head: Normocephalic.  Cardiovascular:     Rate and Rhythm: Normal rate and regular rhythm.     Pulses: Normal pulses.     Heart sounds: Murmur heard.   Pulmonary:     Effort: Pulmonary effort is normal.     Breath sounds: Normal breath sounds.  Musculoskeletal:     Comments: Full ROM of right knee:  No bruising. Small effusion noted in medial joint space.  Tenderness to palpation over medial condyle.  Strength 5/5 lower extremity.  No laxity of right knee.  Pain with mcmurray in medial knee but no click.    Left hand no pain to palpation.  Hand grip 5/5.  No swelling, warmth, erythema.   Neurological:     General: No focal deficit present.     Mental Status: He is alert and oriented to person, place, and time.  Psychiatric:        Mood and Affect: Mood normal.           Assessment & Plan:  [name] KitchenJatavion was seen today for follow-up.  Diagnoses and all orders for this visit:  Essential hypertension -  valsartan-hydrochlorothiazide (DIOVAN-HCT) 320-25 MG tablet; Take 1 tablet by mouth daily. -     COMPLETE METABOLIC PANEL WITH GFR  Peripheral polyneuropathy -     HYDROcodone-acetaminophen (NORCO) 10-325 MG tablet; Take 1 tablet by mouth every 6 (six) hours as needed. -     HYDROcodone-acetaminophen (NORCO) 10-325 MG tablet; Take 1 tablet by mouth every 6 (six) hours as needed. -     HYDROcodone-acetaminophen (NORCO) 10-325 MG tablet; Take 1 tablet by mouth every 6 (six) hours as needed.  Chronic pain syndrome -     HYDROcodone-acetaminophen (NORCO) 10-325 MG tablet; Take 1 tablet by mouth every 6 (six) hours as needed. -     HYDROcodone-acetaminophen (NORCO) 10-325 MG tablet; Take 1 tablet by mouth every 6 (six) hours as needed. -     HYDROcodone-acetaminophen (NORCO)  10-325 MG tablet; Take 1 tablet by mouth every 6 (six) hours as needed. -     DRUG MONITORING, PANEL 6 WITH CONFIRMATION, URINE  Injury of right knee, initial encounter -     meloxicam (MOBIC) 15 MG tablet; Take 1 tablet (15 mg total) by mouth daily. -     DG Knee Complete 4 Views Right  Muscle cramps -     COMPLETE METABOLIC PANEL WITH GFR -     Magnesium  Elevated liver enzymes -     COMPLETE METABOLIC PANEL WITH GFR  BP not to goal. Pt just took medication before appt. He is checking at home and under 150/90. No symptoms.   Pain contract UTD and signed today.  UDS ordered.  [name] KitchenPDMP reviewed during this encounter. norco refilled for 3 months.   Suspect some meniscal irritation/tear. Will get xray. Use knee sleeve. mobic for 2 weeks. Knee exercises printed. If no improvement consider Dr. Darene Lamer appt and advanced imaging planning.   Sounds like some muscle cramping. Start magnesium 500mg  daily.  Check CMP. Watch for overuse as being a trigger.

## 2019-09-04 LAB — COMPLETE METABOLIC PANEL WITH GFR
AG Ratio: 1.1 (calc) (ref 1.0–2.5)
ALT: 23 U/L (ref 9–46)
AST: 24 U/L (ref 10–35)
Albumin: 3.9 g/dL (ref 3.6–5.1)
Alkaline phosphatase (APISO): 168 U/L — ABNORMAL HIGH (ref 35–144)
BUN: 19 mg/dL (ref 7–25)
CO2: 28 mmol/L (ref 20–32)
Calcium: 9.4 mg/dL (ref 8.6–10.3)
Chloride: 104 mmol/L (ref 98–110)
Creat: 1.17 mg/dL (ref 0.70–1.25)
GFR, Est African American: 73 mL/min/{1.73_m2} (ref >=60)
GFR, Est Non African American: 63 mL/min/{1.73_m2} (ref >=60)
Globulin: 3.4 g/dL (calc) (ref 1.9–3.7)
Glucose, Bld: 91 mg/dL (ref 65–99)
Potassium: 4.9 mmol/L (ref 3.5–5.3)
Sodium: 137 mmol/L (ref 135–146)
Total Bilirubin: 0.5 mg/dL (ref 0.2–1.2)
Total Protein: 7.3 g/dL (ref 6.1–8.1)

## 2019-09-04 LAB — MAGNESIUM: Magnesium: 1.9 mg/dL (ref 1.5–2.5)

## 2019-09-04 NOTE — Progress Notes (Signed)
Call pt:   Kidney function looks GREAT and staying in normal range.  Sodium and potassium look great.  Liver enzymes MUCH better.  Magnesium normal range but on lower side. Start magnesium 500mg  daily as told in office.

## 2019-09-05 LAB — DRUG MONITORING, PANEL 6 WITH CONFIRMATION, URINE
6 Acetylmorphine: NEGATIVE ng/mL (ref <10)
Alcohol Metabolites: POSITIVE ng/mL — AB
Amphetamines: NEGATIVE ng/mL (ref <500)
Barbiturates: NEGATIVE ng/mL (ref <300)
Benzodiazepines: NEGATIVE ng/mL (ref <100)
Cocaine Metabolite: NEGATIVE ng/mL (ref <150)
Codeine: NEGATIVE ng/mL (ref <50)
Creatinine: 170.3 mg/dL
Ethyl Glucuronide (ETG): >100000 ng/mL — ABNORMAL HIGH (ref <500)
Ethyl Sulfate (ETS): 54190 ng/mL — ABNORMAL HIGH (ref <100)
Hydrocodone: 887 ng/mL — ABNORMAL HIGH (ref <50)
Hydromorphone: 231 ng/mL — ABNORMAL HIGH (ref <50)
Marijuana Metabolite: 24 ng/mL — ABNORMAL HIGH (ref <5)
Marijuana Metabolite: POSITIVE ng/mL — AB (ref <20)
Methadone Metabolite: NEGATIVE ng/mL (ref <100)
Morphine: NEGATIVE ng/mL (ref <50)
Norhydrocodone: 1050 ng/mL — ABNORMAL HIGH (ref <50)
Opiates: POSITIVE ng/mL — AB (ref <100)
Oxidant: NEGATIVE ug/mL
Oxycodone: NEGATIVE ng/mL (ref <100)
Phencyclidine: NEGATIVE ng/mL (ref <25)
pH: 6.9 (ref 4.5–9.0)

## 2019-09-05 LAB — DM TEMPLATE

## 2019-09-06 NOTE — Progress Notes (Signed)
Annual drug screen for pain contract was positive for alcohol and marijuana. This is a reminder to comply with pain contract and to be able to continue prescribing pain medication you are not supposed to be using alcohol/marijuana. We will screen again soon at a refill visit to confirm this status.

## 2019-11-22 IMAGING — DX DG CHEST 2V
2 series · 2 of 2 positions shown · non-contrast
Comparison: January 12, 2016

CLINICAL DATA: Chest pain

EXAM:
CHEST  2 VIEW

[chest pa]
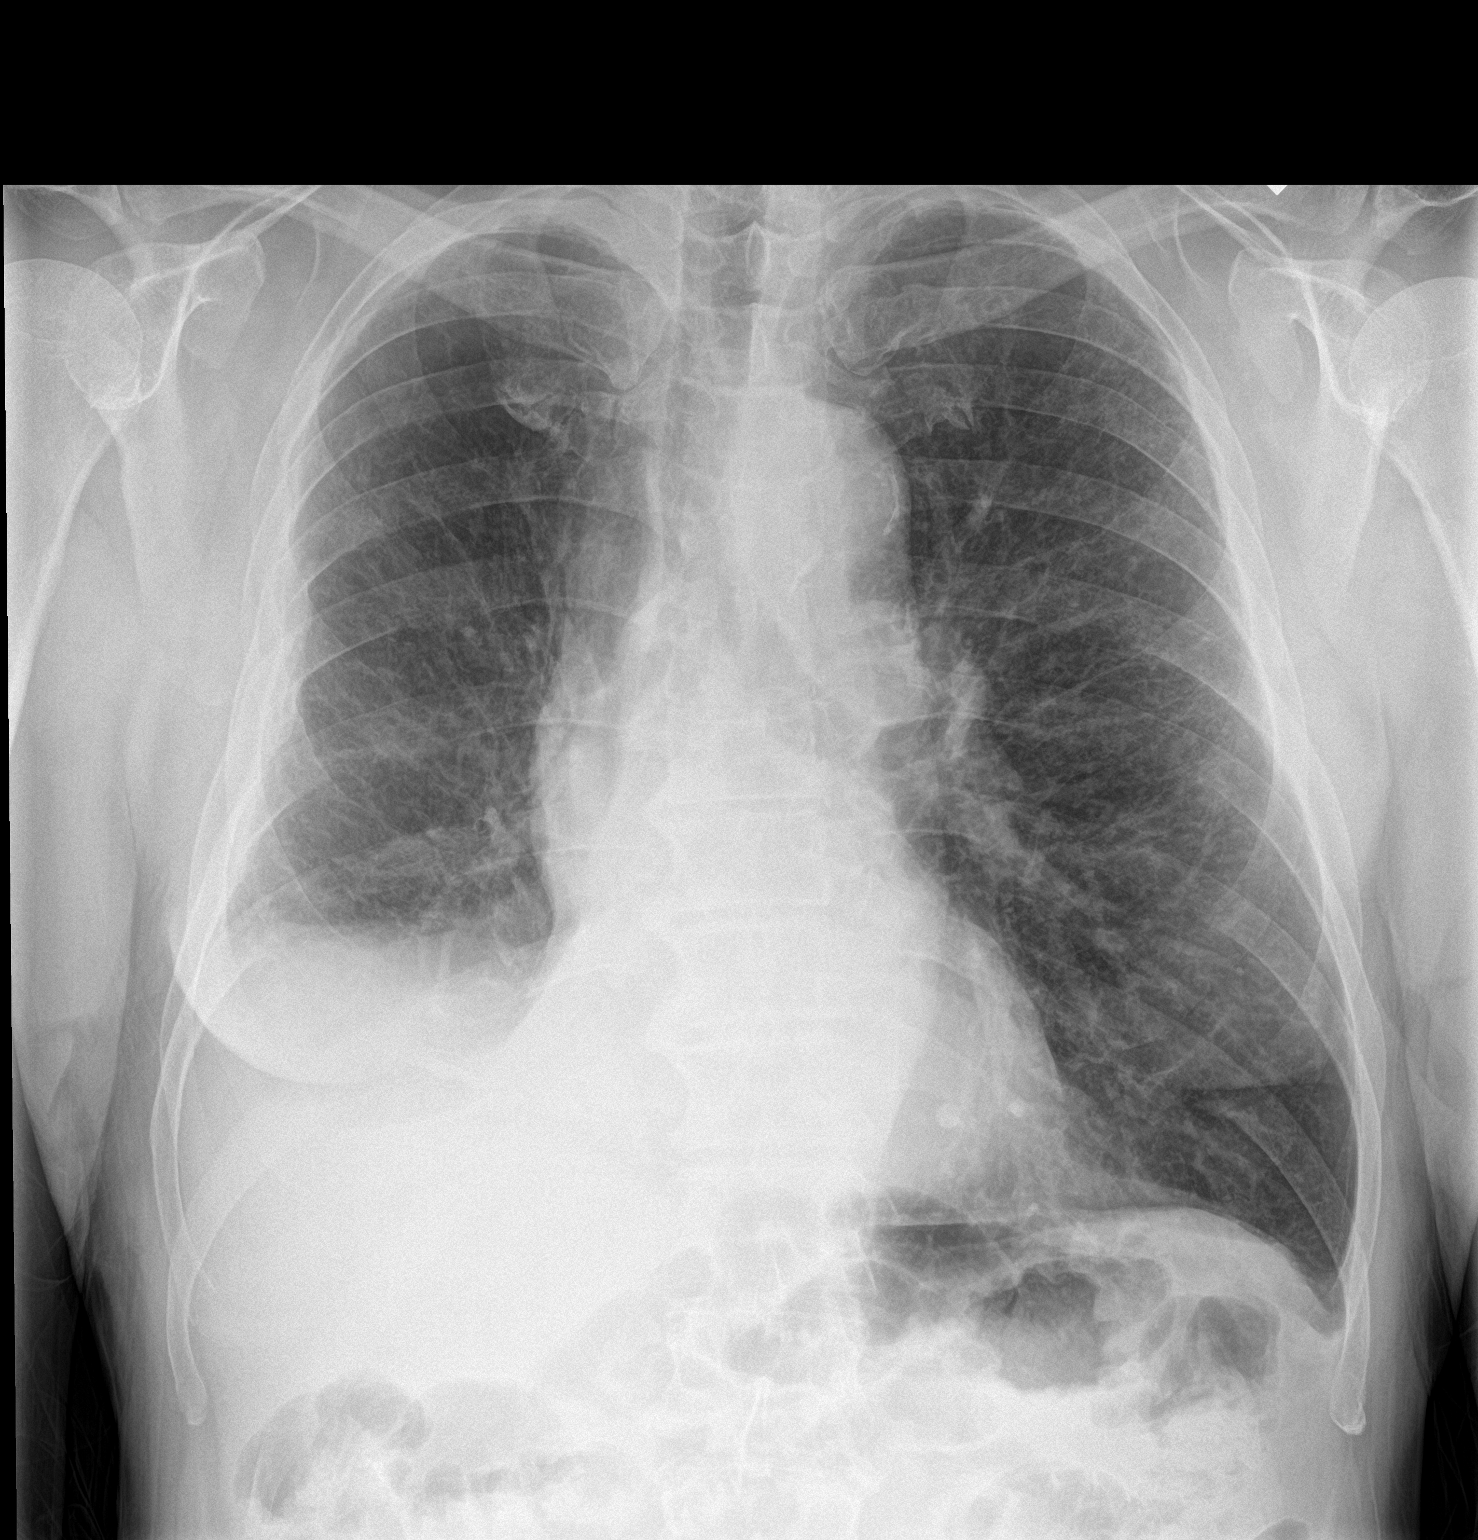

[chest lat]
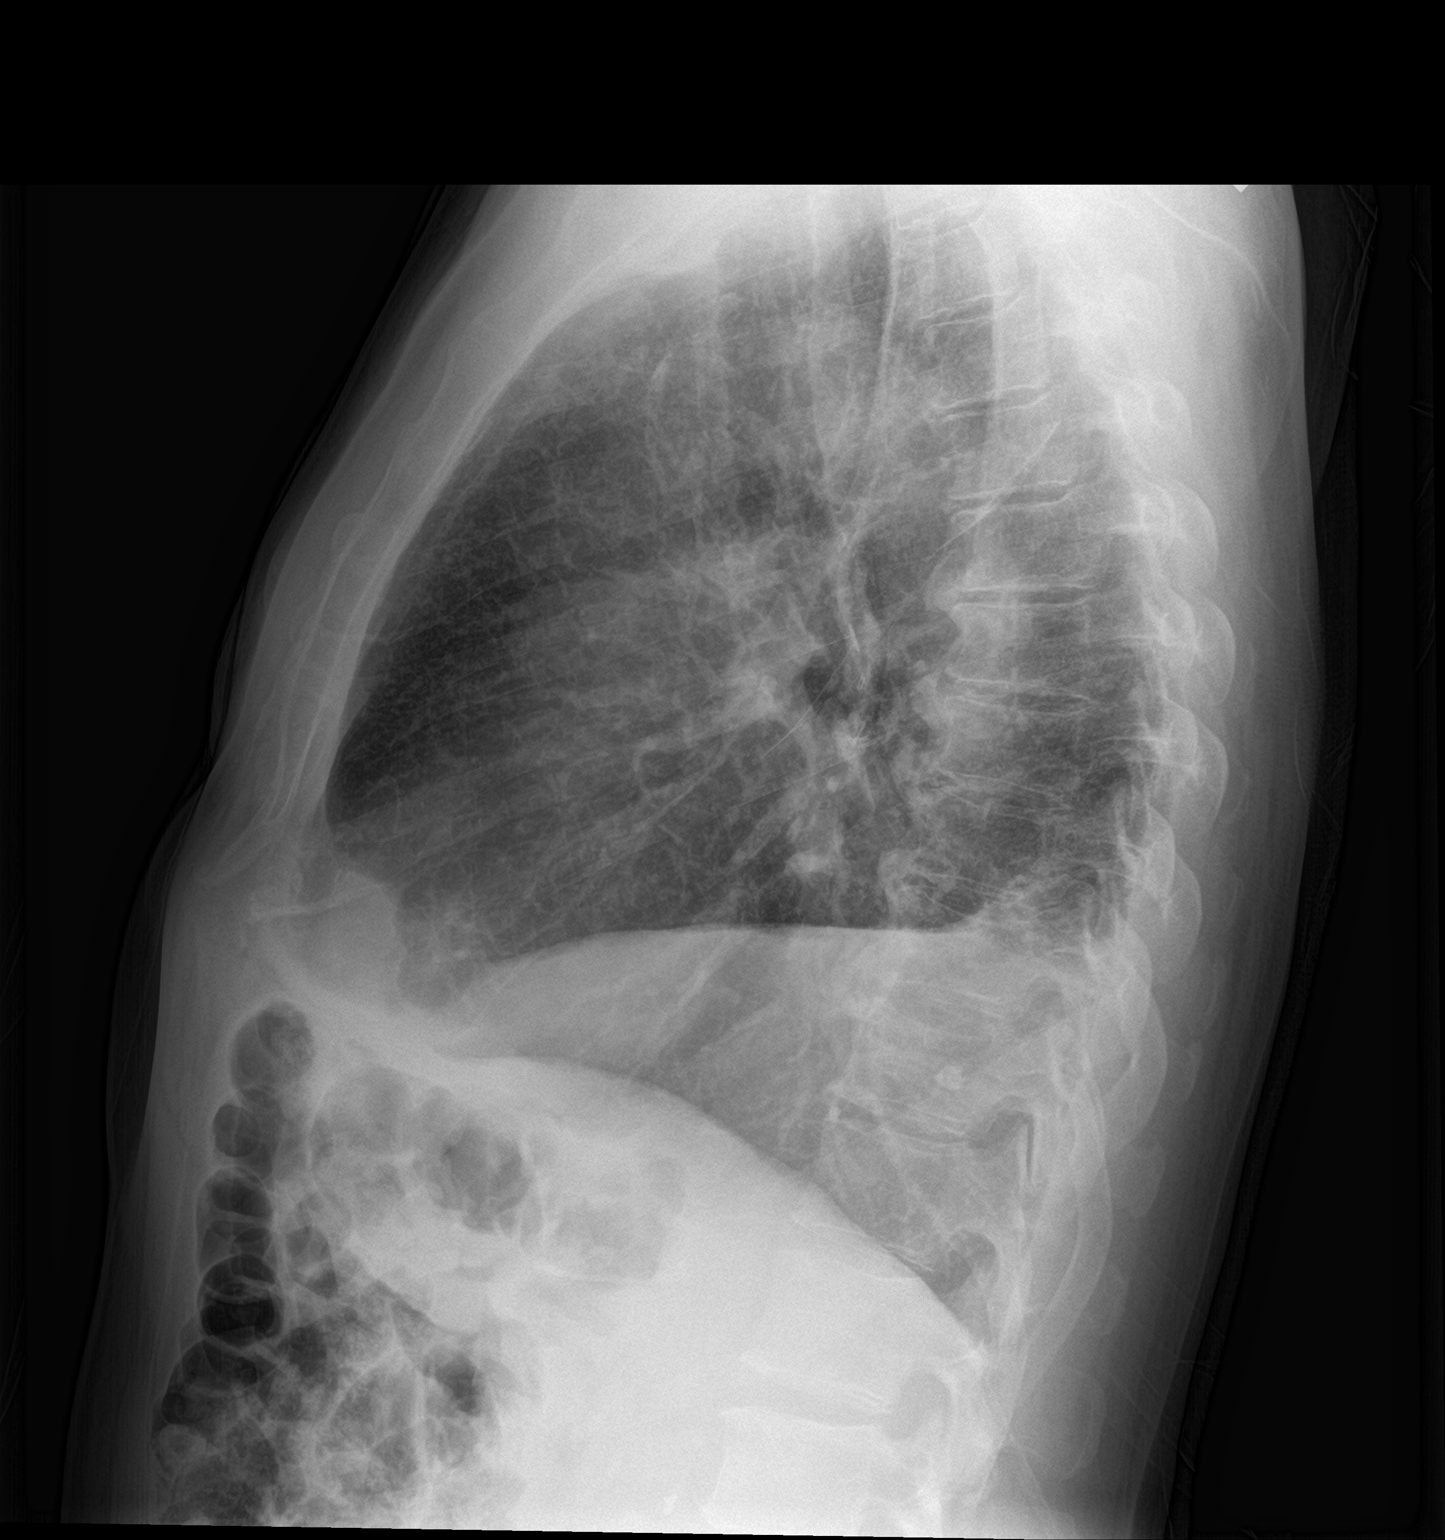

[2 of 2 positions shown; findings below may reference images not displayed]

FINDINGS: There is a right pleural effusion, stable. There is no edema or
consolidation. There is a calcified granuloma in the left base.
Heart size and pulmonary vascularity are normal. No adenopathy.
There is aortic atherosclerosis. There is degenerative change in the
thoracic spine.
IMPRESSION: Stable right pleural effusion. Stable granuloma left base. No edema
or consolidation. Heart size normal. There is aortic
atherosclerosis.

Aortic Atherosclerosis ([id number].A).

## 2019-12-03 ENCOUNTER — Ambulatory Visit (INDEPENDENT_AMBULATORY_CARE_PROVIDER_SITE_OTHER): Payer: Medicare Other | Admitting: Physician Assistant

## 2019-12-03 ENCOUNTER — Encounter: Payer: Self-pay | Admitting: Physician Assistant

## 2019-12-03 ENCOUNTER — Other Ambulatory Visit: Payer: Self-pay

## 2019-12-03 ENCOUNTER — Ambulatory Visit (INDEPENDENT_AMBULATORY_CARE_PROVIDER_SITE_OTHER): Payer: Medicare Other

## 2019-12-03 VITALS — BP 163/72 | HR 68 | Ht 68.0 in | Wt 183.0 lb

## 2019-12-03 DIAGNOSIS — I10 Essential (primary) hypertension: Secondary | ICD-10-CM

## 2019-12-03 DIAGNOSIS — Z23 Encounter for immunization: Secondary | ICD-10-CM | POA: Diagnosis not present

## 2019-12-03 DIAGNOSIS — M25561 Pain in right knee: Secondary | ICD-10-CM

## 2019-12-03 DIAGNOSIS — G629 Polyneuropathy, unspecified: Secondary | ICD-10-CM | POA: Diagnosis not present

## 2019-12-03 DIAGNOSIS — M79641 Pain in right hand: Secondary | ICD-10-CM

## 2019-12-03 DIAGNOSIS — M1711 Unilateral primary osteoarthritis, right knee: Secondary | ICD-10-CM | POA: Insufficient documentation

## 2019-12-03 DIAGNOSIS — M8949 Other hypertrophic osteoarthropathy, multiple sites: Secondary | ICD-10-CM

## 2019-12-03 DIAGNOSIS — M25552 Pain in left hip: Secondary | ICD-10-CM

## 2019-12-03 DIAGNOSIS — M159 Polyosteoarthritis, unspecified: Secondary | ICD-10-CM | POA: Insufficient documentation

## 2019-12-03 DIAGNOSIS — M150 Primary generalized (osteo)arthritis: Secondary | ICD-10-CM

## 2019-12-03 DIAGNOSIS — G894 Chronic pain syndrome: Secondary | ICD-10-CM | POA: Diagnosis not present

## 2019-12-03 DIAGNOSIS — T39395A Adverse effect of other nonsteroidal anti-inflammatory drugs [NSAID], initial encounter: Secondary | ICD-10-CM

## 2019-12-03 DIAGNOSIS — G8929 Other chronic pain: Secondary | ICD-10-CM | POA: Insufficient documentation

## 2019-12-03 DIAGNOSIS — M47816 Spondylosis without myelopathy or radiculopathy, lumbar region: Secondary | ICD-10-CM | POA: Insufficient documentation

## 2019-12-03 DIAGNOSIS — K2960 Other gastritis without bleeding: Secondary | ICD-10-CM

## 2019-12-03 MED ORDER — HYDROCODONE-ACETAMINOPHEN 10-325 MG PO TABS: 1.0000 | ORAL_TABLET | Freq: Four times a day (QID) | ORAL | 0 refills | Status: DC | PRN

## 2019-12-03 MED ORDER — OMEPRAZOLE 40 MG PO CPDR: 40.0000 mg | DELAYED_RELEASE_CAPSULE | Freq: Every day | ORAL | 2 refills | Status: DC

## 2019-12-03 MED ORDER — AMLODIPINE BESYLATE 2.5 MG PO TABS: 2.5000 mg | ORAL_TABLET | Freq: Every day | ORAL | 2 refills | Status: DC

## 2019-12-03 MED ORDER — DICLOFENAC SODIUM 1 % EX GEL: 4.0000 g | Freq: Four times a day (QID) | CUTANEOUS | 5 refills | Status: DC

## 2019-12-03 NOTE — Patient Instructions (Addendum)
Omeprazole to take with mobic together.  Get xrays   Journal for Nurse Practitioners, 15(4), [phone]. Retrieved October 16, 2017 from http://clinicalkey.com/nursing">  Knee Exercises Ask your health care provider which exercises are safe for you. Do exercises exactly as told by your health care provider and adjust them as directed. It is normal to feel mild stretching, pulling, tightness, or discomfort as you do these exercises. Stop right away if you feel sudden pain or your pain gets worse. Do not begin these exercises until told by your health care provider. Stretching and range-of-motion exercises These exercises warm up your muscles and joints and improve the movement and flexibility of your knee. These exercises also help to relieve pain and swelling. Knee extension, prone 1. Lie on your abdomen (prone position) on a bed. 2. Place your left / right knee just beyond the edge of the surface so your knee is not on the bed. You can put a towel under your left / right thigh just above your kneecap for comfort. 3. Relax your leg muscles and allow gravity to straighten your knee (extension). You should feel a stretch behind your left / right knee. 4. Hold this position for __________ seconds. 5. Scoot up so your knee is supported between repetitions. Repeat __________ times. Complete this exercise __________ times a day. Knee flexion, active  1. Lie on your back with both legs straight. If this causes back discomfort, bend your left / right knee so your foot is flat on the floor. 2. Slowly slide your left / right heel back toward your buttocks. Stop when you feel a gentle stretch in the front of your knee or thigh (flexion). 3. Hold this position for __________ seconds. 4. Slowly slide your left / right heel back to the starting position. Repeat __________ times. Complete this exercise __________ times a day. Quadriceps stretch, prone  1. Lie on your abdomen on a firm surface, such as a bed or  padded floor. 2. Bend your left / right knee and hold your ankle. If you cannot reach your ankle or pant leg, loop a belt around your foot and grab the belt instead. 3. Gently pull your heel toward your buttocks. Your knee should not slide out to the side. You should feel a stretch in the front of your thigh and knee (quadriceps). 4. Hold this position for __________ seconds. Repeat __________ times. Complete this exercise __________ times a day. Hamstring, supine 1. Lie on your back (supine position). 2. Loop a belt or towel over the ball of your left / right foot. The ball of your foot is on the walking surface, right under your toes. 3. Straighten your left / right knee and slowly pull on the belt to raise your leg until you feel a gentle stretch behind your knee (hamstring). ? Do not let your knee bend while you do this. ? Keep your other leg flat on the floor. 4. Hold this position for __________ seconds. Repeat __________ times. Complete this exercise __________ times a day. Strengthening exercises These exercises build strength and endurance in your knee. Endurance is the ability to use your muscles for a long time, even after they get tired. Quadriceps, isometric This exercise stretches the muscles in front of your thigh (quadriceps) without moving your knee joint (isometric). 1. Lie on your back with your left / right leg extended and your other knee bent. Put a rolled towel or small pillow under your knee if told by your health care provider. 2.  Slowly tense the muscles in the front of your left / right thigh. You should see your kneecap slide up toward your hip or see increased dimpling just above the knee. This motion will push the back of the knee toward the floor. 3. For __________ seconds, hold the muscle as tight as you can without increasing your pain. 4. Relax the muscles slowly and completely. Repeat __________ times. Complete this exercise __________ times a day. Straight  leg raises This exercise stretches the muscles in front of your thigh (quadriceps) and the muscles that move your hips (hip flexors). 1. Lie on your back with your left / right leg extended and your other knee bent. 2. Tense the muscles in the front of your left / right thigh. You should see your kneecap slide up or see increased dimpling just above the knee. Your thigh may even shake a bit. 3. Keep these muscles tight as you raise your leg 4-6 inches (10-15 cm) off the floor. Do not let your knee bend. 4. Hold this position for __________ seconds. 5. Keep these muscles tense as you lower your leg. 6. Relax your muscles slowly and completely after each repetition. Repeat __________ times. Complete this exercise __________ times a day. Hamstring, isometric 1. Lie on your back on a firm surface. 2. Bend your left / right knee about __________ degrees. 3. Dig your left / right heel into the surface as if you are trying to pull it toward your buttocks. Tighten the muscles in the back of your thighs (hamstring) to "dig" as hard as you can without increasing any pain. 4. Hold this position for __________ seconds. 5. Release the tension gradually and allow your muscles to relax completely for __________ seconds after each repetition. Repeat __________ times. Complete this exercise __________ times a day. Hamstring curls If told by your health care provider, do this exercise while wearing ankle weights. Begin with __________ lb weights. Then increase the weight by 1 lb (0.5 kg) increments. Do not wear ankle weights that are more than __________ lb. 1. Lie on your abdomen with your legs straight. 2. Bend your left / right knee as far as you can without feeling pain. Keep your hips flat against the floor. 3. Hold this position for __________ seconds. 4. Slowly lower your leg to the starting position. Repeat __________ times. Complete this exercise __________ times a day. Squats This exercise  strengthens the muscles in front of your thigh and knee (quadriceps). 1. Stand in front of a table, with your feet and knees pointing straight ahead. You may rest your hands on the table for balance but not for support. 2. Slowly bend your knees and lower your hips like you are going to sit in a chair. ? Keep your weight over your heels, not over your toes. ? Keep your lower legs upright so they are parallel with the table legs. ? Do not let your hips go lower than your knees. ? Do not bend lower than told by your health care provider. ? If your knee pain increases, do not bend as low. 3. Hold the squat position for __________ seconds. 4. Slowly push with your legs to return to standing. Do not use your hands to pull yourself to standing. Repeat __________ times. Complete this exercise __________ times a day. Wall slides This exercise strengthens the muscles in front of your thigh and knee (quadriceps). 1. Lean your back against a smooth wall or door, and walk your feet out 18-24 inches (  46-61 cm) from it. 2. Place your feet hip-width apart. 3. Slowly slide down the wall or door until your knees bend __________ degrees. Keep your knees over your heels, not over your toes. Keep your knees in line with your hips. 4. Hold this position for __________ seconds. Repeat __________ times. Complete this exercise __________ times a day. Straight leg raises This exercise strengthens the muscles that rotate the leg at the hip and move it away from your body (hip abductors). 1. Lie on your side with your left / right leg in the top position. Lie so your head, shoulder, knee, and hip line up. You may bend your bottom knee to help you keep your balance. 2. Roll your hips slightly forward so your hips are stacked directly over each other and your left / right knee is facing forward. 3. Leading with your heel, lift your top leg 4-6 inches (10-15 cm). You should feel the muscles in your outer hip lifting. ? Do  not let your foot drift forward. ? Do not let your knee roll toward the ceiling. 4. Hold this position for __________ seconds. 5. Slowly return your leg to the starting position. 6. Let your muscles relax completely after each repetition. Repeat __________ times. Complete this exercise __________ times a day. Straight leg raises This exercise stretches the muscles that move your hips away from the front of the pelvis (hip extensors). 1. Lie on your abdomen on a firm surface. You can put a pillow under your hips if that is more comfortable. 2. Tense the muscles in your buttocks and lift your left / right leg about 4-6 inches (10-15 cm). Keep your knee straight as you lift your leg. 3. Hold this position for __________ seconds. 4. Slowly lower your leg to the starting position. 5. Let your leg relax completely after each repetition. Repeat __________ times. Complete this exercise __________ times a day. This information is not intended to replace advice given to you by your health care provider. Make sure you discuss any questions you have with your health care provider. Document Revised: 10/17/2017 Document Reviewed: 10/17/2017 Elsevier Patient Education  2020 [employer].

## 2019-12-03 NOTE — Progress Notes (Signed)
Subjective:    Patient ID: Walter Wilkerson, male    DOB: 1950/12/15, 69 y.o.   MRN: [id number]  HPI  Patient is a 69 year old male with chronic pain due to osteoarthritis over his whole body, hypertension who presents to the clinic for medication refills.  Patient does report some more acute on chronic pain.  For the last 3 months patient reports pain in left hip, right knee, right hand index MCP.  He denies any injury.  His left hip bothers him when he is getting in and out of the car and going up the stairs.  His right knee feels very unstable and like it is going to catch at times.  Right knee pain is localized in the medial knee.  His right index finger is very swollen and has been like this for many months.  Honestly he is struggled with intermittent pain of his joints for years.  He is not currently on any anti-inflammatories. When he has tried in the past they hurt his stomach some. No hx of ulcer.   HTN- checking BP at home and up and down. He does report being over 15/90 a lot more recently. No CP, palpitations, headaches, vision changes.   .. Active Ambulatory Problems    Diagnosis Date Noted  . Acquired hypothyroidism 08/11/2014  . Essential hypertension 08/11/2014  . Hyperlipidemia 08/11/2014  . Peripheral neuropathy ([place]) 08/11/2014  . Non-small cell lung cancer ([place]) 08/11/2014  . Shoulder impingement 02/24/2015  . Hyperglycemia 04/14/2015  . Prediabetes 04/15/2015  . Dermatitis 10/18/2015  . Cyst of pharynx 01/12/2016  . Aortic atherosclerosis ([place]) 01/12/2016  . Elevated serum creatinine 02/10/2016  . Fatigue 11/04/2016  . Stress at work 11/04/2016  . Adhesive capsulitis 12/27/2016  . Nausea 04/20/2017  . Epigastric pain 04/20/2017  . PUD (peptic ulcer disease) 04/20/2017  . Metatarsal stress fracture of right foot 01/15/2018  . 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 Chronic pain syndrome 05/29/2018  . Injury of right knee 09/03/2019  . Muscle  cramps 09/03/2019  . Elevated liver enzymes 09/03/2019   Resolved Ambulatory Problems    Diagnosis Date Noted  . Acute bronchitis 01/08/2015   Past Medical History:  Diagnosis Date  . Hypertension   . Lung cancer ([place])   . Thyroid disease        Review of Systems See HPI.     Objective:   Physical Exam Vitals reviewed.  Constitutional:      Appearance: Normal appearance.  Cardiovascular:     Rate and Rhythm: Normal rate and regular rhythm.     Pulses: Normal pulses.     Heart sounds: Normal heart sounds.  Pulmonary:     Effort: Pulmonary effort is normal.     Breath sounds: Normal breath sounds.  Musculoskeletal:     Comments: Left hip: Pain with external and internal ROM.  5/5 strength.  No tenderness over greater trochanter on left to palpation.   Right knee:  NROM.  No effusion.  Positive mcmurrays to the medial knee. Negative anterior drawer.  Tenderness over medial knee palpation.   Right MCP swollen and tender to touch. No warmth or redness.   Neurological:     General: No focal deficit present.     Mental Status: He is alert and oriented to person, place, and time.  Psychiatric:        Mood and Affect: Mood normal.           Assessment &  Plan:  ..Alphonso was seen today for pain.  Diagnoses and all orders for this visit:  Chronic pain syndrome -     HYDROcodone-acetaminophen (NORCO) 10-325 MG tablet; Take 1 tablet by mouth every 6 (six) hours as needed. -     HYDROcodone-acetaminophen (NORCO) 10-325 MG tablet; Take 1 tablet by mouth every 6 (six) hours as needed. -     HYDROcodone-acetaminophen (NORCO) 10-325 MG tablet; Take 1 tablet by mouth every 6 (six) hours as needed. -     diclofenac Sodium (VOLTAREN) 1 % GEL; Apply 4 g topically 4 (four) times daily. To affected joint.  Flu vaccine need -     Cancel: Flu Vaccine QUAD 36+ mos IM -     Flu Vaccine QUAD High Dose(Fluad)  Peripheral polyneuropathy -     HYDROcodone-acetaminophen  (NORCO) 10-325 MG tablet; Take 1 tablet by mouth every 6 (six) hours as needed. -     HYDROcodone-acetaminophen (NORCO) 10-325 MG tablet; Take 1 tablet by mouth every 6 (six) hours as needed. -     HYDROcodone-acetaminophen (NORCO) 10-325 MG tablet; Take 1 tablet by mouth every 6 (six) hours as needed.  Right hand pain -     diclofenac Sodium (VOLTAREN) 1 % GEL; Apply 4 g topically 4 (four) times daily. To affected joint. -     DG Hand Complete Right  Acute pain of right knee -     diclofenac Sodium (VOLTAREN) 1 % GEL; Apply 4 g topically 4 (four) times daily. To affected joint. -     DG Knee Complete 4 Views Right  Left hip pain -     diclofenac Sodium (VOLTAREN) 1 % GEL; Apply 4 g topically 4 (four) times daily. To affected joint. -     DG Hip Unilat W OR W/O Pelvis 2-3 Views Left  Essential hypertension -     amLODipine (NORVASC) 2.5 MG tablet; Take 1 tablet (2.5 mg total) by mouth daily.  NSAID induced gastritis -     omeprazole (PRILOSEC) 40 MG capsule; Take 1 capsule (40 mg total) by mouth daily.   ..PDMP reviewed during this encounter. On pain contract.  Refilled oxycodone.  Follow up in 3 months.  Will repeat UDS at next visit.   Elevated blood pressure today.  Patient is asymptomatic.  It could be due to his uncontrolled pain.  Patient is on maximum Diovan/HCT.  We will add Norvasc 2.5 mg.  Keep check of blood pressures at home.  Goal is under 140/90.  Discussed risk of elevated blood pressure long-term.  Sounds like patient has a lot of osteoarthritis possible in his left hip, right knee, right index MCP.  We will get some x-rays.  We will start diclofenac gel daily.  I did give him some meloxicam. Kidney function great at last check.  He has had a history of NSAID induced gastritis.  He has never had an ulcer.  I am given him omeprazole to take every time he takes the mobic.  I also instructed to take with food.  I also did a long discussion if he does have any GI upset to  stop this therapy.  He likely could benefit from some injections with our sports medicine provider.  Instructed to make appointment with him in the next 1 to 2 weeks.

## 2019-12-04 NOTE — Progress Notes (Signed)
No significant degeneration or arthritis in knee. I am suspicious for some meniscal injury in the medial knee. After exercises and possible injection if not improving may need MRI.

## 2019-12-04 NOTE — Progress Notes (Signed)
Lots of arthritis in hand. Treatment plan stays the same.

## 2019-12-04 NOTE — Progress Notes (Signed)
No evidence of degeneration or arthritis to cause left hip pain. This pain is likely coming from your back. You may need some targeted exercise for that. Lets see what mobic, if tolerated, does. If pain continues to radiate down leg can get xray of lower back.

## 2019-12-17 ENCOUNTER — Ambulatory Visit (INDEPENDENT_AMBULATORY_CARE_PROVIDER_SITE_OTHER): Payer: Medicare Other | Admitting: Sports Medicine

## 2019-12-17 ENCOUNTER — Other Ambulatory Visit: Payer: Self-pay

## 2019-12-17 ENCOUNTER — Other Ambulatory Visit: Payer: Self-pay | Admitting: Sports Medicine

## 2019-12-17 DIAGNOSIS — M1711 Unilateral primary osteoarthritis, right knee: Secondary | ICD-10-CM | POA: Diagnosis not present

## 2019-12-17 DIAGNOSIS — M47816 Spondylosis without myelopathy or radiculopathy, lumbar region: Secondary | ICD-10-CM

## 2019-12-17 MED ORDER — MELOXICAM 15 MG PO TABS: ORAL_TABLET | ORAL | 3 refills | Status: DC

## 2019-12-17 MED ORDER — GABAPENTIN 600 MG PO TABS: 600.0000 mg | ORAL_TABLET | Freq: Three times a day (TID) | ORAL | 3 refills | Status: DC

## 2019-12-17 NOTE — Assessment & Plan Note (Signed)
Bill also has pain in his low back, and deep in the left buttock with radiation down the back of the thigh, worse with most motions and at night, no red flag symptoms, nothing overtly radicular all the way down to the foot. 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 Declines physical therapy so we will have him do some home rehab exercises, double his gabapentin, adding the meloxicam as well per Return to see me in 6 weeks, x-rays and MRI for interventional planning if no better.

## 2019-12-17 NOTE — Assessment & Plan Note (Signed)
Walter Wilkerson is a pleasant 69 year old male, he walks about 4 miles daily on the treadmill, he has been having pain to the medial joint line without mechanical symptoms. X-rays did show some osteoarthritis. We will start conservatively with NSAIDs, he can continue the narcotic prescribed by his PCP. Adding some rehab exercises. Return to see me in 6 weeks, injection if no better.

## 2019-12-17 NOTE — Progress Notes (Signed)
    Procedures performed today:    None.  Independent interpretation of notes and tests performed by another provider:   None.  Brief History, Exam, Impression, and Recommendations:    Primary osteoarthritis of right knee [name] is a pleasant 69 year old male, he walks about 4 miles daily on the treadmill, he has been having pain to the medial joint line without mechanical symptoms. X-rays did show some osteoarthritis. We will start conservatively with NSAIDs, he can continue the narcotic prescribed by his PCP. Adding some rehab exercises. Return to see me in 6 weeks, injection if no better.  Lumbar spondylosis Bill also has pain in his low back, and deep in the left buttock with radiation down the back of the thigh, worse with most motions and at night, no red flag symptoms, nothing overtly radicular all the way down to the foot. 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 Declines physical therapy so we will have him do some home rehab exercises, double his gabapentin, adding the meloxicam as well per Return to see me in 6 weeks, x-rays and MRI for interventional planning if no better.    ___________________________________________ Gwen Her. Dianah Field, M.D., ABFM., CAQSM. Primary Care and [place] Instructor of [place] [employer] [facility] of [employer]

## 2020-01-13 ENCOUNTER — Other Ambulatory Visit: Payer: Self-pay | Admitting: Physician Assistant

## 2020-01-13 DIAGNOSIS — E782 Mixed hyperlipidemia: Secondary | ICD-10-CM

## 2020-01-22 ENCOUNTER — Other Ambulatory Visit: Payer: Self-pay | Admitting: Physician Assistant

## 2020-01-22 DIAGNOSIS — E039 Hypothyroidism, unspecified: Secondary | ICD-10-CM

## 2020-01-28 ENCOUNTER — Ambulatory Visit (INDEPENDENT_AMBULATORY_CARE_PROVIDER_SITE_OTHER): Payer: Medicare Other | Admitting: Sports Medicine

## 2020-01-28 DIAGNOSIS — M1711 Unilateral primary osteoarthritis, right knee: Secondary | ICD-10-CM

## 2020-01-28 DIAGNOSIS — M47816 Spondylosis without myelopathy or radiculopathy, lumbar region: Secondary | ICD-10-CM

## 2020-01-28 NOTE — Progress Notes (Signed)
   Virtual Visit via Telephone   I connected with  Walter Wilkerson  on 01/28/20 by telephone/telehealth and verified that I am speaking with the correct person using two identifiers.   I discussed the limitations, risks, security and privacy concerns of performing an evaluation and management service by telephone, including the higher likelihood of inaccurate diagnosis and treatment, and the availability of in person appointments.  We also discussed the likely need of an additional face to face encounter for complete and high quality delivery of care.  I also discussed with the patient that there may be a patient responsible charge related to this service. The patient expressed understanding and wishes to proceed.  Provider location is in medical facility. Patient location is at their home, different from provider location. People involved in care of the patient during this telehealth encounter were myself, my nurse/medical assistant, and my front office/scheduling team member.  Review of Systems: No fevers, chills, night sweats, weight loss, chest pain, or shortness of breath.   Objective Findings:    General: Speaking full sentences, no audible heavy breathing.  Sounds alert and appropriately interactive.    Independent interpretation of tests performed by another provider:   None.  Brief History, Exam, Impression, and Recommendations:    Primary osteoarthritis of right knee I connected with Bill on the phone, he is a pleasant 70 year old male walking about 4 miles daily on the treadmill, medial joint line pain with knee x-rays that showed osteoarthritis, NSAIDs have worked well, he has no pain, return as needed for this. We can certainly do an injection if he has recurrence of discomfort.  Lumbar spondylosis Bill also had low back pain deep in the left buttock with radiation down the back of the thigh, I did review a CT of the abdomen and pelvis from back in 2011 that showed lumbar DDD  and surgery canal stenosis. He declined physical therapy, we doubled his gabapentin, added meloxicam, he has done well and has no complaints, return as needed.   I discussed the above assessment and treatment plan with the patient. The patient was provided an opportunity to ask questions and all were answered. The patient agreed with the plan and demonstrated an understanding of the instructions.   The patient was advised to call back or seek an in-person evaluation if the symptoms worsen or if the condition fails to improve as anticipated.   I provided 30 minutes of face to face and non-face-to-face time during this encounter date, time was needed to gather information, review chart, records, communicate/coordinate with staff remotely, as well as complete documentation.   ___________________________________________ Gwen Her. Dianah Field, M.D., ABFM., CAQSM. Primary Care and Sports Medicine [facility]  Adjunct Professor of [place] [employer] [facility] of [employer]

## 2020-01-28 NOTE — Assessment & Plan Note (Signed)
Walter Wilkerson also had low back pain deep in the left buttock with radiation down the back of the thigh, I did review a CT of the abdomen and pelvis from back in 2011 that showed lumbar DDD and surgery canal stenosis. He declined physical therapy, we doubled his gabapentin, added meloxicam, he has done well and has no complaints, return as needed.

## 2020-01-28 NOTE — Assessment & Plan Note (Signed)
I connected with Walter Wilkerson on the phone, he is a pleasant 70 year old male walking about 4 miles daily on the treadmill, medial joint line pain with knee x-rays that showed osteoarthritis, NSAIDs have worked well, he has no pain, return as needed for this. We can certainly do an injection if he has recurrence of discomfort.

## 2020-02-13 ENCOUNTER — Other Ambulatory Visit: Payer: Self-pay | Admitting: Physician Assistant

## 2020-02-13 DIAGNOSIS — E782 Mixed hyperlipidemia: Secondary | ICD-10-CM

## 2020-02-21 ENCOUNTER — Other Ambulatory Visit: Payer: Self-pay | Admitting: Physician Assistant

## 2020-02-21 DIAGNOSIS — K2960 Other gastritis without bleeding: Secondary | ICD-10-CM

## 2020-02-21 DIAGNOSIS — I10 Essential (primary) hypertension: Secondary | ICD-10-CM

## 2020-03-04 ENCOUNTER — Encounter: Payer: Self-pay | Admitting: Physician Assistant

## 2020-03-04 ENCOUNTER — Other Ambulatory Visit: Payer: Self-pay

## 2020-03-04 ENCOUNTER — Ambulatory Visit (INDEPENDENT_AMBULATORY_CARE_PROVIDER_SITE_OTHER): Payer: Medicare Other | Admitting: Physician Assistant

## 2020-03-04 VITALS — BP 173/83 | HR 73 | Ht 68.0 in | Wt 191.0 lb

## 2020-03-04 DIAGNOSIS — M159 Polyosteoarthritis, unspecified: Secondary | ICD-10-CM

## 2020-03-04 DIAGNOSIS — I700 Atherosclerosis of aorta: Secondary | ICD-10-CM

## 2020-03-04 DIAGNOSIS — R7301 Impaired fasting glucose: Secondary | ICD-10-CM

## 2020-03-04 DIAGNOSIS — M47816 Spondylosis without myelopathy or radiculopathy, lumbar region: Secondary | ICD-10-CM

## 2020-03-04 DIAGNOSIS — Z125 Encounter for screening for malignant neoplasm of prostate: Secondary | ICD-10-CM

## 2020-03-04 DIAGNOSIS — G894 Chronic pain syndrome: Secondary | ICD-10-CM

## 2020-03-04 DIAGNOSIS — I10 Essential (primary) hypertension: Secondary | ICD-10-CM

## 2020-03-04 DIAGNOSIS — E039 Hypothyroidism, unspecified: Secondary | ICD-10-CM

## 2020-03-04 DIAGNOSIS — G629 Polyneuropathy, unspecified: Secondary | ICD-10-CM

## 2020-03-04 DIAGNOSIS — Z1211 Encounter for screening for malignant neoplasm of colon: Secondary | ICD-10-CM

## 2020-03-04 DIAGNOSIS — M8949 Other hypertrophic osteoarthropathy, multiple sites: Secondary | ICD-10-CM | POA: Diagnosis not present

## 2020-03-04 MED ORDER — VALSARTAN-HYDROCHLOROTHIAZIDE 320-25 MG PO TABS
1.0000 | ORAL_TABLET | Freq: Every day | ORAL | 1 refills | Status: DC
Start: 2020-03-04 — End: 2020-10-30

## 2020-03-04 MED ORDER — AMLODIPINE BESYLATE 5 MG PO TABS: 5.0000 mg | ORAL_TABLET | Freq: Every day | ORAL | 0 refills | Status: DC

## 2020-03-04 MED ORDER — AMLODIPINE BESYLATE 2.5 MG PO TABS
2.5000 mg | ORAL_TABLET | Freq: Every day | ORAL | 1 refills | Status: DC
Start: 2020-03-04 — End: 2020-03-04

## 2020-03-04 NOTE — Progress Notes (Signed)
Subjective:    Patient ID: Walter Wilkerson, male    DOB: Jun 06, 1950, 70 y.o.   MRN: [id number]  HPI  Patient is a 70 year old male with hypertension, hyperlipidemia, chronic pain due to multiple joints with osteoarthritis, hypothyroidism who presents to the clinic for follow-up.  Patient used to be on 3 times a day Norco.  He has slowly been weaning himself off.  He feels like it is not helping anyway.  He does not feel much change in his pain level coming off Norco.  He denies any withdrawal side effects.  He continues to take gabapentin and meloxicam.    Patient denies any chest pain, shortness of breath, headache, vision changes.  He is taking his Diovan and Norvasc daily.  He denies any lower extremity edema.  He is checking his blood pressure at home and seems to be running in the 160s on top.  He does not feel like his blood pressure is controlled.  .. Active Ambulatory Problems    Diagnosis Date Noted  . Acquired hypothyroidism 08/11/2014  . Essential hypertension 08/11/2014  . Hyperlipidemia 08/11/2014  . Peripheral neuropathy ([place]) 08/11/2014  . Non-small cell lung cancer ([place]) 08/11/2014  . Shoulder impingement 02/24/2015  . Hyperglycemia 04/14/2015  . Prediabetes 04/15/2015  . Dermatitis 10/18/2015  . Cyst of pharynx 01/12/2016  . Aortic atherosclerosis ([place]) 01/12/2016  . Elevated serum creatinine 02/10/2016  . Fatigue 11/04/2016  . Stress at work 11/04/2016  . Adhesive capsulitis 12/27/2016  . Nausea 04/20/2017  . Epigastric pain 04/20/2017  . PUD (peptic ulcer disease) 04/20/2017  . Metatarsal stress fracture of right foot 01/15/2018  . 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 Chronic pain syndrome 05/29/2018  . Muscle cramps 09/03/2019  . Elevated liver enzymes 09/03/2019  . Lumbar spondylosis 12/03/2019  . Primary osteoarthritis of right knee 12/03/2019  . Right hand pain 12/03/2019  . NSAID induced gastritis 12/03/2019  . Primary  osteoarthritis involving multiple joints 12/03/2019   Resolved Ambulatory Problems    Diagnosis Date Noted  . Acute bronchitis 01/08/2015  . Injury of right knee 09/03/2019   Past Medical History:  Diagnosis Date  . Hypertension   . Lung cancer ([place])   . Thyroid disease       Review of Systems  All other systems reviewed and are negative.      Objective:   Physical Exam Vitals reviewed.  Constitutional:      Appearance: Normal appearance.  Cardiovascular:     Rate and Rhythm: Normal rate and regular rhythm.     Pulses: Normal pulses.     Heart sounds: Normal heart sounds.  Pulmonary:     Effort: Pulmonary effort is normal.     Breath sounds: Normal breath sounds.  Neurological:     General: No focal deficit present.     Mental Status: He is alert and oriented to person, place, and time.  Psychiatric:        Mood and Affect: Mood normal.           Assessment & Plan:  [name] KitchenClements was seen today for pain.  Diagnoses and all orders for this visit:  Essential hypertension -     COMPLETE METABOLIC PANEL WITH GFR -     valsartan-hydrochlorothiazide (DIOVAN-HCT) 320-25 MG tablet; Take 1 tablet by mouth daily. -     Discontinue: amLODipine (NORVASC) 2.5 MG tablet; Take 1 tablet (2.5 mg total) by mouth daily. -     amLODipine (NORVASC)  5 MG tablet; Take 1 tablet (5 mg total) by mouth daily.  Peripheral polyneuropathy -     COMPLETE METABOLIC PANEL WITH GFR  Chronic pain syndrome -     COMPLETE METABOLIC PANEL WITH GFR  Primary osteoarthritis involving multiple joints  Colon cancer screening -     Cancel: Ambulatory referral to Gastroenterology -     Ambulatory referral to Gastroenterology  Aortic atherosclerosis ([place]) -     Lipid Panel w/reflex Direct LDL  Lumbar spondylosis  Prostate cancer screening -     PSA  Acquired hypothyroidism -     TSH  Elevated fasting glucose -     Hemoglobin A1c   Needs screening labs for medication refills.   BP  not to goal. Increased norvasc to 5mg . Continue diovan/HcT. Keep BP log at home.  Follow up in 3 months.   norco taken off medication list. Per patient has not helped he is weaning himself off. Continue on gabapentin and mobic. Follow up as needed.

## 2020-03-05 LAB — HEMOGLOBIN A1C
Hgb A1c MFr Bld: 5.6 % of total Hgb (ref <5.7)
Mean Plasma Glucose: 114 mg/dL
eAG (mmol/L): 6.3 mmol/L

## 2020-03-05 LAB — COMPLETE METABOLIC PANEL WITH GFR
AG Ratio: 1.1 (calc) (ref 1.0–2.5)
ALT: 38 U/L (ref 9–46)
AST: 40 U/L — ABNORMAL HIGH (ref 10–35)
Albumin: 3.9 g/dL (ref 3.6–5.1)
Alkaline phosphatase (APISO): 103 U/L (ref 35–144)
BUN: 21 mg/dL (ref 7–25)
CO2: 23 mmol/L (ref 20–32)
Calcium: 9.2 mg/dL (ref 8.6–10.3)
Chloride: 107 mmol/L (ref 98–110)
Creat: 1.05 mg/dL (ref 0.70–1.18)
GFR, Est African American: 83 mL/min/{1.73_m2} (ref >=60)
GFR, Est Non African American: 72 mL/min/{1.73_m2} (ref >=60)
Globulin: 3.4 g/dL (calc) (ref 1.9–3.7)
Glucose, Bld: 87 mg/dL (ref 65–99)
Potassium: 4.1 mmol/L (ref 3.5–5.3)
Sodium: 140 mmol/L (ref 135–146)
Total Bilirubin: 0.6 mg/dL (ref 0.2–1.2)
Total Protein: 7.3 g/dL (ref 6.1–8.1)

## 2020-03-05 LAB — LIPID PANEL W/REFLEX DIRECT LDL
Cholesterol: 195 mg/dL (ref <200)
HDL: 109 mg/dL (ref >=40)
LDL Cholesterol (Calc): 72 mg/dL (calc)
Non-HDL Cholesterol (Calc): 86 mg/dL (calc) (ref <130)
Total CHOL/HDL Ratio: 1.8 (calc) (ref <5.0)
Triglycerides: 61 mg/dL (ref <150)

## 2020-03-05 LAB — PSA: PSA: 0.96 ng/mL (ref <=4.0)

## 2020-03-05 LAB — TSH: TSH: 2.02 mIU/L (ref 0.40–4.50)

## 2020-03-06 NOTE — Progress Notes (Signed)
[name],   PSA is low and stable.  Cholesterol is GREAT.  a1C up from 1 year ago but not in pre-diabetes range. Will continue to monitor but start limiting sugars and carbs now.  Thyroid great.  AST up from 6 months ago. That is a liver enzyme. Recheck in 3 months at next visit.

## 2020-03-09 ENCOUNTER — Encounter: Payer: Self-pay | Admitting: Physician Assistant

## 2020-03-13 ENCOUNTER — Other Ambulatory Visit: Payer: Self-pay | Admitting: Physician Assistant

## 2020-03-13 DIAGNOSIS — E782 Mixed hyperlipidemia: Secondary | ICD-10-CM

## 2020-04-03 ENCOUNTER — Telehealth: Payer: Self-pay | Admitting: Physician Assistant

## 2020-04-03 NOTE — Telephone Encounter (Signed)
I received a call from [name] stating he received a letter from Walter Wilkerson stating as of 02/11/2020 Walter Wilkerson was no longer part of the Walter Wilkerson. He was calling to switch providers and I assured him he would not need to and that I would call Lacretia Nicks to get it figured out. I called Abigail Butts and she let me know that she had already had other patients call about the same issue and when she spoke with Lonn Georgia about it Lonn Georgia called and was told the system created the letter and sent it out to our patients and that it is not for real. Abigail Butts asked me to have [name] send her the letter so she could send it to [facility] and they could see exactly what the letter is telling our patient's. I called patient back and gave him all the information and he has no capability to scan and email so he is going to have his wife fax the letter to [place] on Monday and I will get it to [place]

## 2020-04-12 ENCOUNTER — Other Ambulatory Visit: Payer: Self-pay | Admitting: Sports Medicine

## 2020-04-12 DIAGNOSIS — M47816 Spondylosis without myelopathy or radiculopathy, lumbar region: Secondary | ICD-10-CM

## 2020-05-02 ENCOUNTER — Other Ambulatory Visit: Payer: Self-pay | Admitting: Physician Assistant

## 2020-05-02 DIAGNOSIS — E039 Hypothyroidism, unspecified: Secondary | ICD-10-CM

## 2020-05-10 HISTORY — PX: COLONOSCOPY: SHX174

## 2020-05-14 LAB — HM COLONOSCOPY

## 2020-06-01 ENCOUNTER — Ambulatory Visit (INDEPENDENT_AMBULATORY_CARE_PROVIDER_SITE_OTHER): Payer: Medicare Other | Admitting: Physician Assistant

## 2020-06-01 ENCOUNTER — Other Ambulatory Visit: Payer: Self-pay

## 2020-06-01 VITALS — BP 160/81 | HR 83 | Ht 68.0 in | Wt 188.0 lb

## 2020-06-01 DIAGNOSIS — G894 Chronic pain syndrome: Secondary | ICD-10-CM

## 2020-06-01 DIAGNOSIS — J309 Allergic rhinitis, unspecified: Secondary | ICD-10-CM | POA: Diagnosis not present

## 2020-06-01 DIAGNOSIS — I10 Essential (primary) hypertension: Secondary | ICD-10-CM | POA: Diagnosis not present

## 2020-06-01 DIAGNOSIS — G629 Polyneuropathy, unspecified: Secondary | ICD-10-CM

## 2020-06-01 MED ORDER — GABAPENTIN 300 MG PO CAPS: ORAL_CAPSULE | ORAL | 2 refills | Status: DC

## 2020-06-01 MED ORDER — CELECOXIB 100 MG PO CAPS: 100.0000 mg | ORAL_CAPSULE | Freq: Two times a day (BID) | ORAL | 2 refills | Status: DC

## 2020-06-01 MED ORDER — METHYLPREDNISOLONE SODIUM SUCC 125 MG IJ SOLR
125.0000 mg | Freq: Once | INTRAMUSCULAR | Status: AC
  Administered 2020-06-01: 125 mg via INTRAMUSCULAR

## 2020-06-01 MED ORDER — AMLODIPINE BESYLATE 10 MG PO TABS: 10.0000 mg | ORAL_TABLET | Freq: Every day | ORAL | 0 refills | Status: DC

## 2020-06-01 MED ORDER — BENZONATATE 200 MG PO CAPS
200.0000 mg | ORAL_CAPSULE | Freq: Two times a day (BID) | ORAL | 0 refills | Status: DC | PRN
Start: 2020-06-01 — End: 2020-10-08

## 2020-06-01 NOTE — Progress Notes (Signed)
Subjective:    Patient ID: Walter Wilkerson, male    DOB: Apr 05, 1950, 70 y.o.   MRN: [id number]  HPI  Patient is a 70 year old male with aortic atherosclerosis, hypertension, chronic pain due to osteoarthritis who presents to the clinic to discuss getting back on pain contract.  A few months ago patient had thoughts he would like to get off Norco because he felt like it was not helping him that much.  He has been off Norco for almost 3 months and his pain is much worse.  He would rated8 out of 10.  It is diffuse throughout his whole body.  Most affected are her shoulders, hands, elbows, knees.  He takes meloxicam but does not seem to be helping. He is on gabapentin for neuropathy and pain which helps a lot but feels like he needs more.  He would like to go back on Norco. He was taking 3-4 a day when he stopped.   He does feel like his allergies have worsened over the past week.  He complains of a dry cough, nasal congestion and sinus drainage.  He denies any fever, chills, shortness of breath, GI symptoms, loss of smell or taste.  He does have covid vaccine. he has not been tested for covid.   BP staying in the 140-150s over 80s at home. No CP, palpitations, headaches, dizziness, or vision changes.     .. Active Ambulatory Problems    Diagnosis Date Noted  . Acquired hypothyroidism 08/11/2014  . Essential hypertension 08/11/2014  . Hyperlipidemia 08/11/2014  . Peripheral neuropathy ([place]) 08/11/2014  . Non-small cell lung cancer ([place]) 08/11/2014  . Shoulder impingement 02/24/2015  . Hyperglycemia 04/14/2015  . Prediabetes 04/15/2015  . Dermatitis 10/18/2015  . Cyst of pharynx 01/12/2016  . Aortic atherosclerosis ([place]) 01/12/2016  . Elevated serum creatinine 02/10/2016  . Fatigue 11/04/2016  . Stress at work 11/04/2016  . Adhesive capsulitis 12/27/2016  . Nausea 04/20/2017  . Epigastric pain 04/20/2017  . PUD (peptic ulcer disease) 04/20/2017  . Metatarsal stress fracture of right  foot 01/15/2018  . 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 Chronic pain syndrome 05/29/2018  . Muscle cramps 09/03/2019  . Elevated liver enzymes 09/03/2019  . Lumbar spondylosis 12/03/2019  . Primary osteoarthritis of right knee 12/03/2019  . Right hand pain 12/03/2019  . NSAID induced gastritis 12/03/2019  . Primary osteoarthritis involving multiple joints 12/03/2019  . Allergic sinusitis 06/03/2020   Resolved Ambulatory Problems    Diagnosis Date Noted  . Acute bronchitis 01/08/2015  . Injury of right knee 09/03/2019   Past Medical History:  Diagnosis Date  . Hypertension   . Lung cancer ([place])   . Thyroid disease       Review of Systems See HPI.     Objective:   Physical Exam Vitals reviewed.  Constitutional:      Appearance: Normal appearance. He is obese.  HENT:     Head: Normocephalic.     Right Ear: Tympanic membrane and external ear normal.     Left Ear: Tympanic membrane and external ear normal.     Nose: Congestion present.     Mouth/Throat:     Mouth: Mucous membranes are moist.     Pharynx: Posterior oropharyngeal erythema present. No oropharyngeal exudate.  Eyes:     Extraocular Movements: Extraocular movements intact.     Conjunctiva/sclera: Conjunctivae normal.     Pupils: Pupils are equal, round, and reactive to light.  Cardiovascular:  Rate and Rhythm: Normal rate and regular rhythm.  Pulmonary:     Effort: Pulmonary effort is normal.     Breath sounds: Normal breath sounds.     Comments: Absent breath sounds over right upper lung due to resection.  Musculoskeletal:     Cervical back: Normal range of motion.  Lymphadenopathy:     Cervical: No cervical adenopathy.  Neurological:     General: No focal deficit present.     Mental Status: He is alert and oriented to person, place, and time.  Psychiatric:        Mood and Affect: Mood normal.           Assessment & Plan:  [name] KitchenAntino was seen today for  follow-up.  Diagnoses and all orders for this visit:  Chronic pain syndrome -     DRUG MONITORING, PANEL 6 WITH CONFIRMATION, URINE -     celecoxib (CELEBREX) 100 MG capsule; Take 1 capsule (100 mg total) by mouth 2 (two) times daily.  Allergic sinusitis -     benzonatate (TESSALON) 200 MG capsule; Take 1 capsule (200 mg total) by mouth 2 (two) times daily as needed for cough. -     methylPREDNISolone sodium succinate (SOLU-MEDROL) 125 mg/2 mL injection 125 mg  Peripheral polyneuropathy -     gabapentin (NEURONTIN) 300 MG capsule; To use with 600mg  as needed for pain up to 3 times a day.  Essential hypertension -     amLODipine (NORVASC) 10 MG tablet; Take 1 tablet (10 mg total) by mouth daily.   Pt has some allergic sinusitis symptoms.  Tessalon given for cough.  Solumedrol 125mg  IM in office.  Continue allergy prevention.  Rest and hydrate.   Pain contract filled out today. [name] Kitchen  Opioid Risk Tool - 06/01/20 0908      Family History of Substance Abuse   Alcohol Negative    Illegal Drugs Negative    Rx Drugs Negative      Personal History of Substance Abuse   Alcohol Negative    Illegal Drugs Negative    Rx Drugs Negative      Age   Age between 21-45 years  No      History of Preadolescent Sexual Abuse   History of Preadolescent Sexual Abuse Negative or Male      Psychological Disease   Psychological Disease Negative    Depression Negative      Total Score   Opioid Risk Tool Scoring 0    Opioid Risk Interpretation Low Risk          UDS ordered.  Will send over norco bid once confirm urine is clean.  Follow up every 3 months for controlled substance due to [place].  Added celebrex. Stop mobic.  [name] Kitchen.PDMP reviewed during this encounter.   Increased BP medication to 10mg  of norvasc.  Recheck in 3 months.

## 2020-06-03 ENCOUNTER — Encounter: Payer: Self-pay | Admitting: Physician Assistant

## 2020-06-03 DIAGNOSIS — J309 Allergic rhinitis, unspecified: Secondary | ICD-10-CM | POA: Insufficient documentation

## 2020-06-03 LAB — DRUG MONITORING, PANEL 6 WITH CONFIRMATION, URINE
6 Acetylmorphine: NEGATIVE ng/mL (ref <10)
Alcohol Metabolites: POSITIVE ng/mL — AB
Amphetamines: NEGATIVE ng/mL (ref <500)
Barbiturates: NEGATIVE ng/mL (ref <300)
Benzodiazepines: NEGATIVE ng/mL (ref <100)
Cocaine Metabolite: NEGATIVE ng/mL (ref <150)
Creatinine: 142 mg/dL
Ethyl Glucuronide (ETG): >100000 ng/mL — ABNORMAL HIGH (ref <500)
Ethyl Sulfate (ETS): 19815 ng/mL — ABNORMAL HIGH (ref <100)
Marijuana Metabolite: NEGATIVE ng/mL (ref <20)
Methadone Metabolite: NEGATIVE ng/mL (ref <100)
Opiates: NEGATIVE ng/mL (ref <100)
Oxidant: NEGATIVE ug/mL
Oxycodone: NEGATIVE ng/mL (ref <100)
Phencyclidine: NEGATIVE ng/mL (ref <25)
pH: 6.3 (ref 4.5–9.0)

## 2020-06-03 LAB — DM TEMPLATE

## 2020-06-03 NOTE — Progress Notes (Signed)
Alcohol was found in urine. To be on a pain contract you can not combine opioids with alcohol. Is that something you want to give up to be on the pain contract. We will routine check at least every 6 months to 1 year.

## 2020-06-09 MED ORDER — HYDROCODONE-ACETAMINOPHEN 10-325 MG PO TABS: 1.0000 | ORAL_TABLET | Freq: Two times a day (BID) | ORAL | 0 refills | Status: AC

## 2020-06-09 MED ORDER — HYDROCODONE-ACETAMINOPHEN 10-325 MG PO TABS: 1.0000 | ORAL_TABLET | Freq: Two times a day (BID) | ORAL | 0 refills | Status: DC

## 2020-06-09 NOTE — Progress Notes (Signed)
Ok norco sent for bid.

## 2020-06-09 NOTE — Addendum Note (Signed)
Addended by: [name] on: 06/09/2020 12:44 PM   Modules accepted: Orders

## 2020-06-16 ENCOUNTER — Encounter: Payer: Self-pay | Admitting: Physician Assistant

## 2020-07-31 DIAGNOSIS — H5340 Unspecified visual field defects: Secondary | ICD-10-CM | POA: Diagnosis not present

## 2020-07-31 DIAGNOSIS — Z888 Allergy status to other drugs, medicaments and biological substances status: Secondary | ICD-10-CM | POA: Diagnosis not present

## 2020-07-31 DIAGNOSIS — K219 Gastro-esophageal reflux disease without esophagitis: Secondary | ICD-10-CM | POA: Diagnosis not present

## 2020-07-31 DIAGNOSIS — H02834 Dermatochalasis of left upper eyelid: Secondary | ICD-10-CM | POA: Diagnosis not present

## 2020-07-31 DIAGNOSIS — Z79899 Other long term (current) drug therapy: Secondary | ICD-10-CM | POA: Diagnosis not present

## 2020-07-31 DIAGNOSIS — E039 Hypothyroidism, unspecified: Secondary | ICD-10-CM | POA: Diagnosis not present

## 2020-07-31 DIAGNOSIS — Z7982 Long term (current) use of aspirin: Secondary | ICD-10-CM | POA: Diagnosis not present

## 2020-07-31 DIAGNOSIS — E079 Disorder of thyroid, unspecified: Secondary | ICD-10-CM | POA: Diagnosis not present

## 2020-07-31 DIAGNOSIS — E785 Hyperlipidemia, unspecified: Secondary | ICD-10-CM | POA: Diagnosis not present

## 2020-07-31 DIAGNOSIS — I10 Essential (primary) hypertension: Secondary | ICD-10-CM | POA: Diagnosis not present

## 2020-07-31 DIAGNOSIS — H02831 Dermatochalasis of right upper eyelid: Secondary | ICD-10-CM | POA: Diagnosis not present

## 2020-08-03 ENCOUNTER — Ambulatory Visit (INDEPENDENT_AMBULATORY_CARE_PROVIDER_SITE_OTHER): Payer: Medicare Other | Admitting: Sports Medicine

## 2020-08-03 ENCOUNTER — Other Ambulatory Visit: Payer: Self-pay

## 2020-08-03 ENCOUNTER — Ambulatory Visit (INDEPENDENT_AMBULATORY_CARE_PROVIDER_SITE_OTHER): Payer: Medicare Other

## 2020-08-03 DIAGNOSIS — M1711 Unilateral primary osteoarthritis, right knee: Secondary | ICD-10-CM

## 2020-08-03 NOTE — Assessment & Plan Note (Signed)
This is a very pleasant 70 year old male with known right knee osteoarthritis, chronic process with exacerbation and worsening pain at the medial joint line worse with flexion, pivoting. He likely has a degenerative meniscal tear. Oral NSAIDs ineffective, we did injection today, if no better in 4 weeks we will proceed with MRI for arthroscopy planning.

## 2020-08-03 NOTE — Progress Notes (Signed)
    Procedures performed today:    Procedure: Real-time Ultrasound Guided injection of the right knee Device: Samsung HS60  Verbal informed consent obtained.  Time-out conducted.  Noted no overlying erythema, induration, or other signs of local infection.  Skin prepped in a sterile fashion.  Local anesthesia: Topical Ethyl chloride.  With sterile technique and under real time ultrasound guidance: Noted trace effusion, 1 cc Kenalog 40, 2 cc lidocaine, 2 cc bupivacaine injected easily Completed without difficulty  Advised to call if fevers/chills, erythema, induration, drainage, or persistent bleeding.  Images permanently stored and available for review in PACS.  Impression: Technically successful ultrasound guided injection.  Independent interpretation of notes and tests performed by another provider:   None.  Brief History, Exam, Impression, and Recommendations:    Primary osteoarthritis of right knee This is a very pleasant 70 year old male with known right knee osteoarthritis, chronic process with exacerbation and worsening pain at the medial joint line worse with flexion, pivoting. He likely has a degenerative meniscal tear. Oral NSAIDs ineffective, we did injection today, if no better in 4 weeks we will proceed with MRI for arthroscopy planning.    ___________________________________________ Gwen Her. Dianah Field, M.D., ABFM., CAQSM. Primary Care and [place] Instructor of [employer] [facility] of [employer]

## 2020-09-01 ENCOUNTER — Ambulatory Visit: Payer: Medicare Other | Admitting: Sports Medicine

## 2020-09-01 ENCOUNTER — Ambulatory Visit: Payer: Medicare Other | Admitting: Physician Assistant

## 2020-09-21 ENCOUNTER — Ambulatory Visit: Payer: Medicare Other | Admitting: Physician Assistant

## 2020-10-02 ENCOUNTER — Ambulatory Visit: Payer: Medicare Other | Admitting: Physician Assistant

## 2020-10-06 ENCOUNTER — Other Ambulatory Visit: Payer: Self-pay | Admitting: Physician Assistant

## 2020-10-06 DIAGNOSIS — G894 Chronic pain syndrome: Secondary | ICD-10-CM

## 2020-10-08 ENCOUNTER — Other Ambulatory Visit: Payer: Self-pay

## 2020-10-08 ENCOUNTER — Ambulatory Visit (INDEPENDENT_AMBULATORY_CARE_PROVIDER_SITE_OTHER): Payer: Medicare Other | Admitting: Sports Medicine

## 2020-10-08 DIAGNOSIS — G8929 Other chronic pain: Secondary | ICD-10-CM

## 2020-10-08 DIAGNOSIS — M25561 Pain in right knee: Secondary | ICD-10-CM

## 2020-10-08 MED ORDER — TRAMADOL HCL 50 MG PO TABS: 50.0000 mg | ORAL_TABLET | Freq: Three times a day (TID) | ORAL | 0 refills | Status: DC | PRN

## 2020-10-08 NOTE — Progress Notes (Signed)
    Procedures performed today:    None.  Independent interpretation of notes and tests performed by another provider:   None.  Brief History, Exam, Impression, and Recommendations:    Chronic pain of right knee Walter Wilkerson returns, he is a pleasant 70 year old male, very minimal right knee osteoarthritis, he saw me about 2 months ago, he had pain in the medial joint line worse with flexion and pivoting, consistent with meniscal tearing, after failure of oral NSAIDs and conservative treatments, bracing we did an injection, he had about 3 weeks of improvement and now has recurrence of discomfort.  I do think he has a meniscal tear, proceeding with MRI and referral to Dr. Berenice Primas, tramadol for pain relief in the meantime.  Chronic process with exacerbation and pharmacologic intervention  ___________________________________________ Gwen Her. Dianah Field, M.D., ABFM., CAQSM. Primary Care and [place] Instructor of [place] [facility] of Medicine

## 2020-10-08 NOTE — Assessment & Plan Note (Addendum)
[name] returns, he is a pleasant 70 year old male, very minimal right knee osteoarthritis, he saw me about 2 months ago, he had pain in the medial joint line worse with flexion and pivoting, consistent with meniscal tearing, after failure of oral NSAIDs and conservative treatments, bracing we did an injection, he had about 3 weeks of improvement and now has recurrence of discomfort.  I do think he has a meniscal tear, proceeding with MRI and referral to Dr. Berenice Primas, tramadol for pain relief in the meantime.

## 2020-10-11 ENCOUNTER — Ambulatory Visit (INDEPENDENT_AMBULATORY_CARE_PROVIDER_SITE_OTHER): Payer: Medicare Other

## 2020-10-11 ENCOUNTER — Other Ambulatory Visit: Payer: Self-pay

## 2020-10-11 DIAGNOSIS — M25561 Pain in right knee: Secondary | ICD-10-CM

## 2020-10-11 DIAGNOSIS — G8929 Other chronic pain: Secondary | ICD-10-CM

## 2020-10-11 DIAGNOSIS — S72431A Displaced fracture of medial condyle of right femur, initial encounter for closed fracture: Secondary | ICD-10-CM | POA: Diagnosis not present

## 2020-10-11 DIAGNOSIS — M23221 Derangement of posterior horn of medial meniscus due to old tear or injury, right knee: Secondary | ICD-10-CM | POA: Diagnosis not present

## 2020-10-22 DIAGNOSIS — S83241A Other tear of medial meniscus, current injury, right knee, initial encounter: Secondary | ICD-10-CM | POA: Diagnosis not present

## 2020-10-22 DIAGNOSIS — M25561 Pain in right knee: Secondary | ICD-10-CM | POA: Diagnosis not present

## 2020-10-30 ENCOUNTER — Other Ambulatory Visit: Payer: Self-pay

## 2020-10-30 ENCOUNTER — Ambulatory Visit (INDEPENDENT_AMBULATORY_CARE_PROVIDER_SITE_OTHER): Payer: Medicare Other | Admitting: Physician Assistant

## 2020-10-30 ENCOUNTER — Telehealth: Payer: Self-pay | Admitting: Neurology

## 2020-10-30 ENCOUNTER — Encounter: Payer: Self-pay | Admitting: Physician Assistant

## 2020-10-30 VITALS — BP 143/72 | HR 76 | Temp 98.7°F | Ht 68.0 in | Wt 187.0 lb

## 2020-10-30 DIAGNOSIS — Z23 Encounter for immunization: Secondary | ICD-10-CM | POA: Diagnosis not present

## 2020-10-30 DIAGNOSIS — G629 Polyneuropathy, unspecified: Secondary | ICD-10-CM

## 2020-10-30 DIAGNOSIS — E039 Hypothyroidism, unspecified: Secondary | ICD-10-CM

## 2020-10-30 DIAGNOSIS — D696 Thrombocytopenia, unspecified: Secondary | ICD-10-CM

## 2020-10-30 DIAGNOSIS — Z01818 Encounter for other preprocedural examination: Secondary | ICD-10-CM

## 2020-10-30 DIAGNOSIS — I10 Essential (primary) hypertension: Secondary | ICD-10-CM | POA: Diagnosis not present

## 2020-10-30 DIAGNOSIS — G894 Chronic pain syndrome: Secondary | ICD-10-CM | POA: Diagnosis not present

## 2020-10-30 MED ORDER — AMLODIPINE BESYLATE 10 MG PO TABS: 10.0000 mg | ORAL_TABLET | Freq: Every day | ORAL | 1 refills | Status: DC

## 2020-10-30 MED ORDER — DULOXETINE HCL 30 MG PO CPEP: 30.0000 mg | ORAL_CAPSULE | Freq: Every day | ORAL | 0 refills | Status: DC

## 2020-10-30 MED ORDER — VALSARTAN-HYDROCHLOROTHIAZIDE 320-25 MG PO TABS: 1.0000 | ORAL_TABLET | Freq: Every day | ORAL | 1 refills | Status: DC

## 2020-10-30 NOTE — Patient Instructions (Signed)
Influenza (Flu) Vaccine (Inactivated or Recombinant): What You Need to Know 1. Why get vaccinated? Influenza vaccine can prevent influenza (flu). Flu is a contagious disease that spreads around the [place] every year, usually between October and May. Anyone can get the flu, but it is more dangerous for some people. Infants and young children, people 33 years and older, pregnant people, and people with certain health conditions or a weakened immune system are at greatest risk of flu complications. Pneumonia, bronchitis, sinus infections, and ear infections are examples of flu-related complications. If you have a medical condition, such as heart disease, cancer, or diabetes, flu can make it worse. Flu can cause fever and chills, sore throat, muscle aches, fatigue, cough, headache, and runny or stuffy nose. Some people may have vomiting and diarrhea, though this is more common in children than adults. In an average year, thousands of people in the [place] die from flu, and many more are hospitalized. Flu vaccine prevents millions of illnesses and flu-related visits to the doctor each year. 2. Influenza vaccines CDC recommends everyone 6 months and older get vaccinated every flu season. Children 6 months through 36 years of age may need 2 doses during a single flu season. Everyone else needs only 1 dose each flu season. It takes about 2 weeks for protection to develop after vaccination. There are many flu viruses, and they are always changing. Each year a new flu vaccine is made to protect against the influenza viruses believed to be likely to cause disease in the upcoming flu season. Even when the vaccine doesn't exactly match these viruses, it may still provide some protection. Influenza vaccine does not cause flu. Influenza vaccine may be given at the same time as other vaccines. 3. Talk with your health care provider Tell your vaccination provider if the person getting the vaccine: Has had  an allergic reaction after a previous dose of influenza vaccine, or has any severe, life-threatening allergies Has ever had Guillain-Barr Syndrome (also called "GBS") In some cases, your health care provider may decide to postpone influenza vaccination until a future visit. Influenza vaccine can be administered at any time during pregnancy. People who are or will be pregnant during influenza season should receive inactivated influenza vaccine. People with minor illnesses, such as a cold, may be vaccinated. People who are moderately or severely ill should usually wait until they recover before getting influenza vaccine. Your health care provider can give you more information. 4. Risks of a vaccine reaction Soreness, redness, and swelling where the shot is given, fever, muscle aches, and headache can happen after influenza vaccination. There may be a very small increased risk of Guillain-Barr Syndrome (GBS) after inactivated influenza vaccine (the flu shot). Young children who get the flu shot along with pneumococcal vaccine (PCV13) and/or DTaP vaccine at the same time might be slightly more likely to have a seizure caused by fever. Tell your health care provider if a child who is getting flu vaccine has ever had a seizure. People sometimes faint after medical procedures, including vaccination. Tell your provider if you feel dizzy or have vision changes or ringing in the ears. As with any medicine, there is a very remote chance of a vaccine causing a severe allergic reaction, other serious injury, or death. 5. What if there is a serious problem? An allergic reaction could occur after the vaccinated person leaves the clinic. If you see signs of a severe allergic reaction (hives, swelling of the face and throat, difficulty breathing,  a fast heartbeat, dizziness, or weakness), call 9-1-1 and get the person to the nearest hospital. For other signs that concern you, call your health care provider. Adverse  reactions should be reported to the Vaccine Adverse Event Reporting System (VAERS). Your health care provider will usually file this report, or you can do it yourself. Visit the VAERS website at www.vaers.SamedayNews.es or call 805-421-8661. VAERS is only for reporting reactions, and VAERS staff members do not give medical advice. 6. The National Vaccine Injury Compensation Program The [employer] Vaccine Injury Compensation Program (VICP) is a federal program that was created to compensate people who may have been injured by certain vaccines. Claims regarding alleged injury or death due to vaccination have a time limit for filing, which may be as short as two years. Visit the VICP website at GoldCloset.com.ee or call 872-013-4276 to learn about the program and about filing a claim. 7. How can I learn more? Ask your health care provider. Call your local or state health department. Visit the website of the Food and Drug Administration (FDA) for vaccine package inserts and additional information at TraderRating.uy. Contact the Centers for Disease Control and Prevention (CDC): Call 954-158-8618 (1-800-CDC-INFO) or Visit CDC's website at https://gibson.com/. Vaccine Information Statement Inactivated Influenza Vaccine (08/16/2019) This information is not intended to replace advice given to you by your health care provider. Make sure you discuss any questions you have with your health care provider. Document Revised: 10/03/2019 Document Reviewed: 10/03/2019 Elsevier Patient Education  [employer].   Tdap (Tetanus, Diphtheria, Pertussis) Vaccine: What You Need to Know 1. Why get vaccinated? Tdap vaccine can prevent tetanus, diphtheria, and pertussis. Diphtheria and pertussis spread from person to person. Tetanus enters the body through cuts or wounds. TETANUS (T) causes painful stiffening of the muscles. Tetanus can lead to serious health problems, including  being unable to open the mouth, having trouble swallowing and breathing, or death. DIPHTHERIA (D) can lead to difficulty breathing, heart failure, paralysis, or death. PERTUSSIS (aP), also known as "whooping cough," can cause uncontrollable, violent coughing that makes it hard to breathe, eat, or drink. Pertussis can be extremely serious especially in babies and young children, causing pneumonia, convulsions, brain damage, or death. In teens and adults, it can cause weight loss, loss of bladder control, passing out, and rib fractures from severe coughing. 2. Tdap vaccine Tdap is only for children 7 years and older, adolescents, and adults.  Adolescents should receive a single dose of Tdap, preferably at age 22 or 105 years. Pregnant people should get a dose of Tdap during every pregnancy, preferably during the early part of the third trimester, to help protect the newborn from pertussis. Infants are most at risk for severe, life-threatening complications from pertussis. Adults who have never received Tdap should get a dose of Tdap. Also, adults should receive a booster dose of either Tdap or Td (a different vaccine that protects against tetanus and diphtheria but not pertussis) every 10 years, or after 5 years in the case of a severe or dirty wound or burn. Tdap may be given at the same time as other vaccines. 3. Talk with your health care provider Tell your vaccine provider if the person getting the vaccine: Has had an allergic reaction after a previous dose of any vaccine that protects against tetanus, diphtheria, or pertussis, or has any severe, life-threatening allergies Has had a coma, decreased level of consciousness, or prolonged seizures within 7 days after a previous dose of any pertussis vaccine (DTP, DTaP,  or Tdap) Has seizures or another nervous system problem Has ever had Guillain-Barr Syndrome (also called "GBS") Has had severe pain or swelling after a previous dose of any vaccine that  protects against tetanus or diphtheria In some cases, your health care provider may decide to postpone Tdap vaccination until a future visit. People with minor illnesses, such as a cold, may be vaccinated. People who are moderately or severely ill should usually wait until they recover before getting Tdap vaccine.  Your health care provider can give you more information. 4. Risks of a vaccine reaction Pain, redness, or swelling where the shot was given, mild fever, headache, feeling tired, and nausea, vomiting, diarrhea, or stomachache sometimes happen after Tdap vaccination. People sometimes faint after medical procedures, including vaccination. Tell your provider if you feel dizzy or have vision changes or ringing in the ears.  As with any medicine, there is a very remote chance of a vaccine causing a severe allergic reaction, other serious injury, or death. 5. What if there is a serious problem? An allergic reaction could occur after the vaccinated person leaves the clinic. If you see signs of a severe allergic reaction (hives, swelling of the face and throat, difficulty breathing, a fast heartbeat, dizziness, or weakness), call 9-1-1 and get the person to the nearest hospital. For other signs that concern you, call your health care provider.  Adverse reactions should be reported to the Vaccine Adverse Event Reporting System (VAERS). Your health care provider will usually file this report, or you can do it yourself. Visit the VAERS website at www.vaers.SamedayNews.es or call (845)255-0657. VAERS is only for reporting reactions, and VAERS staff members do not give medical advice. 6. The National Vaccine Injury Compensation Program The [employer] Vaccine Injury Compensation Program (VICP) is a federal program that was created to compensate people who may have been injured by certain vaccines. Claims regarding alleged injury or death due to vaccination have a time limit for filing, which may be as short as two  years. Visit the VICP website at GoldCloset.com.ee or call 814 109 2031 to learn about the program and about filing a claim. 7. How can I learn more? Ask your health care provider. Call your local or state health department. Visit the website of the Food and Drug Administration (FDA) for vaccine package inserts and additional information at TraderRating.uy. Contact the Centers for Disease Control and Prevention (CDC): Call 367-327-7693 (1-800-CDC-INFO) or Visit CDC's website at http://hunter.com/. Vaccine Information Statement Tdap (Tetanus, Diphtheria, Pertussis) Vaccine (08/16/2019) This information is not intended to replace advice given to you by your health care provider. Make sure you discuss any questions you have with your health care provider. Document Revised: 09/11/2019 Document Reviewed: 09/11/2019 Elsevier Patient Education  2022 [employer].

## 2020-10-30 NOTE — Progress Notes (Signed)
Subjective:    Patient ID: Walter Wilkerson, male    DOB: 1950/06/25, 70 y.o.   MRN: [id number]  HPI Pt is a 70 yo male with HTN, hypothyroidism, neuropathy, OA, HLD and chronic pain who presents to the clinic for follow up.   He denies any CP, SOB, headaches, palpitations, dizziness. He is taking norvasc and diovan/HCT daily.   OA pain is up and down. Not taking norco regularly just as needed. On celebrex and gabapentin. Continues to have pain.   He is having right knee surgery coming up soon.   .. Active Ambulatory Problems    Diagnosis Date Noted   Acquired hypothyroidism 08/11/2014   Essential hypertension 08/11/2014   Hyperlipidemia 08/11/2014   Peripheral neuropathy ([place]) 08/11/2014   Non-small cell lung cancer ([place]) 08/11/2014   Shoulder impingement 02/24/2015   Hyperglycemia 04/14/2015   Prediabetes 04/15/2015   Dermatitis 10/18/2015   Cyst of pharynx 01/12/2016   Aortic atherosclerosis ([place]) 01/12/2016   Elevated serum creatinine 02/10/2016   Fatigue 11/04/2016   Stress at work 11/04/2016   Adhesive capsulitis 12/27/2016   Nausea 04/20/2017   Epigastric pain 04/20/2017   PUD (peptic ulcer disease) 04/20/2017   Metatarsal stress fracture of right foot 01/15/2018   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 03/02/2018   Chronic pain syndrome 05/29/2018   Muscle cramps 09/03/2019   Elevated liver enzymes 09/03/2019   Lumbar spondylosis 12/03/2019   Chronic pain of right knee 12/03/2019   Right hand pain 12/03/2019   NSAID induced gastritis 12/03/2019   Primary osteoarthritis involving multiple joints 12/03/2019   Allergic sinusitis 06/03/2020   Resolved Ambulatory Problems    Diagnosis Date Noted   Acute bronchitis 01/08/2015   Injury of right knee 09/03/2019   Past Medical History:  Diagnosis Date   Hypertension    Lung cancer ([place])    Thyroid disease        Review of Systems  All other systems reviewed and are negative.     Objective:    Physical Exam Vitals reviewed.  Constitutional:      Appearance: Normal appearance.  HENT:     Head: Normocephalic.  Neck:     Vascular: No carotid bruit.  Cardiovascular:     Rate and Rhythm: Normal rate and regular rhythm.     Pulses: Normal pulses.     Heart sounds: Normal heart sounds. No murmur heard. Pulmonary:     Effort: Pulmonary effort is normal.     Comments: Right lung absent breath sounds.  Lymphadenopathy:     Cervical: No cervical adenopathy.  Neurological:     General: No focal deficit present.     Mental Status: He is alert and oriented to person, place, and time.  Psychiatric:        Mood and Affect: Mood normal.          Assessment & Plan:  [name] KitchenOden was seen today for hypertension.  Diagnoses and all orders for this visit:  Essential hypertension -     COMPLETE METABOLIC PANEL WITH GFR -     CBC -     amLODipine (NORVASC) 10 MG tablet; Take 1 tablet (10 mg total) by mouth daily. -     valsartan-hydrochlorothiazide (DIOVAN-HCT) 320-25 MG tablet; Take 1 tablet by mouth daily.  Peripheral polyneuropathy -     COMPLETE METABOLIC PANEL WITH GFR  Chronic pain syndrome -     DULoxetine (CYMBALTA) 30 MG capsule; Take 1 capsule (30 mg total) by  mouth daily.  Need for influenza vaccination -     Flu Vaccine QUAD High Dose(Fluad)  Need for Tdap vaccination -     Tdap vaccine greater than or equal to 7yo IM  Preoperative examination -     Tdap vaccine greater than or equal to 7yo IM BP looks great today.  Cmp ordered.  Refilled for 6 months.  Labs ordered for upcoming surgery.  Started cymbalta to see if would help with overall pain. Discussed SE. Follow up in 3 months.  Flu and Tdap given today.

## 2020-10-30 NOTE — Telephone Encounter (Signed)
Received note from pharmacy that Levothyroxine manufacturer is changing. Patient made aware he will need TSH drawn in 6 weeks. He is okay with this plan. Order placed.   [place].

## 2020-10-31 LAB — COMPLETE METABOLIC PANEL WITH GFR
AG Ratio: 1.1 (calc) (ref 1.0–2.5)
ALT: 17 U/L (ref 9–46)
AST: 19 U/L (ref 10–35)
Albumin: 3.9 g/dL (ref 3.6–5.1)
Alkaline phosphatase (APISO): 82 U/L (ref 35–144)
BUN: 20 mg/dL (ref 7–25)
CO2: 24 mmol/L (ref 20–32)
Calcium: 9.8 mg/dL (ref 8.6–10.3)
Chloride: 106 mmol/L (ref 98–110)
Creat: 1.03 mg/dL (ref 0.70–1.28)
Globulin: 3.4 g/dL (calc) (ref 1.9–3.7)
Glucose, Bld: 87 mg/dL (ref 65–139)
Potassium: 4.2 mmol/L (ref 3.5–5.3)
Sodium: 139 mmol/L (ref 135–146)
Total Bilirubin: 0.6 mg/dL (ref 0.2–1.2)
Total Protein: 7.3 g/dL (ref 6.1–8.1)
eGFR: 78 mL/min/{1.73_m2} (ref >=60)

## 2020-10-31 LAB — CBC
HCT: 39.1 % (ref 38.5–50.0)
Hemoglobin: 12.6 g/dL — ABNORMAL LOW (ref 13.2–17.1)
MCH: 30.3 pg (ref 27.0–33.0)
MCHC: 32.2 g/dL (ref 32.0–36.0)
MCV: 94 fL (ref 80.0–100.0)
Platelets: 65 10*3/uL — ABNORMAL LOW (ref 140–400)
RBC: 4.16 10*6/uL — ABNORMAL LOW (ref 4.20–5.80)
RDW: 13.4 % (ref 11.0–15.0)
WBC: 6.9 10*3/uL (ref 3.8–10.8)

## 2020-11-02 ENCOUNTER — Encounter: Payer: Self-pay | Admitting: Physician Assistant

## 2020-11-02 NOTE — Progress Notes (Signed)
Kidney, liver, glucose look good.  Your platelets are very low compared to last check. Recheck this week to confirm. Surgeon needs to be aware before you knee procedure.  Any new medications or supplements?  Stop ASA for now.  Avoid alcohol.

## 2020-11-03 NOTE — Progress Notes (Signed)
Results forwarded to Dr. Berenice Primas at [facility].  Order placed for repeat CBC.  Charyl Bigger, CMA

## 2020-11-03 NOTE — Progress Notes (Signed)
Please send to surgeon and ok to order CBC for Wednesday or Thursday. thanks

## 2020-11-03 NOTE — Addendum Note (Signed)
Addended by: Fonnie Mu on: 11/03/2020 01:52 PM   Modules accepted: Orders

## 2020-11-04 DIAGNOSIS — M23303 Other meniscus derangements, unspecified medial meniscus, right knee: Secondary | ICD-10-CM | POA: Diagnosis not present

## 2020-11-04 DIAGNOSIS — M6751 Plica syndrome, right knee: Secondary | ICD-10-CM | POA: Diagnosis not present

## 2020-11-04 DIAGNOSIS — G8918 Other acute postprocedural pain: Secondary | ICD-10-CM | POA: Diagnosis not present

## 2020-11-04 DIAGNOSIS — M23221 Derangement of posterior horn of medial meniscus due to old tear or injury, right knee: Secondary | ICD-10-CM | POA: Diagnosis not present

## 2020-11-04 DIAGNOSIS — M94261 Chondromalacia, right knee: Secondary | ICD-10-CM | POA: Diagnosis not present

## 2020-11-11 DIAGNOSIS — D696 Thrombocytopenia, unspecified: Secondary | ICD-10-CM | POA: Diagnosis not present

## 2020-11-11 LAB — CBC WITH DIFFERENTIAL/PLATELET
Absolute Monocytes: 807 cells/uL (ref 200–950)
Basophils Absolute: 89 cells/uL (ref 0–200)
Basophils Relative: 1.2 %
Eosinophils Absolute: 296 cells/uL (ref 15–500)
Eosinophils Relative: 4 %
HCT: 38.2 % — ABNORMAL LOW (ref 38.5–50.0)
Hemoglobin: 12.7 g/dL — ABNORMAL LOW (ref 13.2–17.1)
Lymphs Abs: 1584 cells/uL (ref 850–3900)
MCH: 30.8 pg (ref 27.0–33.0)
MCHC: 33.2 g/dL (ref 32.0–36.0)
MCV: 92.5 fL (ref 80.0–100.0)
MPV: 12.8 fL — ABNORMAL HIGH (ref 7.5–12.5)
Monocytes Relative: 10.9 %
Neutro Abs: 4625 cells/uL (ref 1500–7800)
Neutrophils Relative %: 62.5 %
Platelets: 96 10*3/uL — ABNORMAL LOW (ref 140–400)
RBC: 4.13 10*6/uL — ABNORMAL LOW (ref 4.20–5.80)
RDW: 13.4 % (ref 11.0–15.0)
Total Lymphocyte: 21.4 %
WBC: 7.4 10*3/uL (ref 3.8–10.8)

## 2020-11-11 NOTE — Progress Notes (Signed)
Platelets improving.

## 2020-11-26 ENCOUNTER — Other Ambulatory Visit: Payer: Self-pay | Admitting: Physician Assistant

## 2020-11-26 DIAGNOSIS — T39395A Adverse effect of other nonsteroidal anti-inflammatory drugs [NSAID], initial encounter: Secondary | ICD-10-CM

## 2020-12-08 ENCOUNTER — Telehealth (INDEPENDENT_AMBULATORY_CARE_PROVIDER_SITE_OTHER): Payer: Medicare Other | Admitting: Physician Assistant

## 2020-12-08 ENCOUNTER — Encounter: Payer: Self-pay | Admitting: Physician Assistant

## 2020-12-08 VITALS — Ht 68.5 in | Wt 182.0 lb

## 2020-12-08 DIAGNOSIS — R051 Acute cough: Secondary | ICD-10-CM

## 2020-12-08 DIAGNOSIS — U071 COVID-19: Secondary | ICD-10-CM

## 2020-12-08 MED ORDER — ALBUTEROL SULFATE HFA 108 (90 BASE) MCG/ACT IN AERS: 2.0000 | INHALATION_SPRAY | RESPIRATORY_TRACT | 0 refills | Status: DC | PRN

## 2020-12-08 MED ORDER — NIRMATRELVIR/RITONAVIR (PAXLOVID)TABLET: 3.0000 | ORAL_TABLET | Freq: Two times a day (BID) | ORAL | 0 refills | Status: AC

## 2020-12-08 NOTE — Patient Instructions (Signed)
COVID-19: Quarantine and Isolation °Quarantine °If you were exposed °Quarantine and stay away from others when you have been in close contact with someone who has COVID-19. °Isolate °If you are sick or test positive °Isolate when you are sick or when you have COVID-19, even if you don't have symptoms. °When to stay home °Calculating quarantine °The date of your exposure is considered day 0. Day 1 is the first full day after your last contact with a person who has had COVID-19. Stay home and away from other people for at least 5 days. Learn why CDC updated guidance for the general public. °IF YOU were exposed to COVID-19 and are NOT  °up to dateIF YOU were exposed to COVID-19 and are NOT on COVID-19 vaccinations °Quarantine for at least 5 days °Stay home °Stay home and quarantine for at least 5 full days. °Wear a well-fitting mask if you must be around others in your home. °Do not travel. °Get tested °Even if you don't develop symptoms, get tested at least 5 days after you last had close contact with someone with COVID-19. °After quarantine °Watch for symptoms °Watch for symptoms until 10 days after you last had close contact with someone with COVID-19. °Avoid travel °It is best to avoid travel until a full 10 days after you last had close contact with someone with COVID-19. °If you develop symptoms °Isolate immediately and get tested. Continue to stay home until you know the results. Wear a well-fitting mask around others. °Take precautions until day 10 °Wear a well-fitting mask °Wear a well-fitting mask for 10 full days any time you are around others inside your home or in public. Do not go to places where you are unable to wear a well-fitting mask. °If you must travel during days 6-10, take precautions. °Avoid being around people who are more likely to get very sick from COVID-19. °IF YOU were exposed to COVID-19 and are  °up to dateIF YOU were exposed to COVID-19 and are on COVID-19 vaccinations °No  quarantine °You do not need to stay home unless you develop symptoms. °Get tested °Even if you don't develop symptoms, get tested at least 5 days after you last had close contact with someone with COVID-19. °Watch for symptoms °Watch for symptoms until 10 days after you last had close contact with someone with COVID-19. °If you develop symptoms °Isolate immediately and get tested. Continue to stay home until you know the results. Wear a well-fitting mask around others. °Take precautions until day 10 °Wear a well-fitting mask °Wear a well-fitting mask for 10 full days any time you are around others inside your home or in public. Do not go to places where you are unable to wear a well-fitting mask. °Take precautions if traveling °Avoid being around people who are more likely to get very sick from COVID-19. °IF YOU were exposed to COVID-19 and had confirmed COVID-19 within the past 90 days (you tested positive using a viral test) °No quarantine °You do not need to stay home unless you develop symptoms. °Watch for symptoms °Watch for symptoms until 10 days after you last had close contact with someone with COVID-19. °If you develop symptoms °Isolate immediately and get tested. Continue to stay home until you know the results. Wear a well-fitting mask around others. °Take precautions until day 10 °Wear a well-fitting mask °Wear a well-fitting mask for 10 full days any time you are around others inside your home or in public. Do not go to places where you are   unable to wear a well-fitting mask. °Take precautions if traveling °Avoid being around people who are more likely to get very sick from COVID-19. °Calculating isolation °Day 0 is your first day of symptoms or a positive viral test. Day 1 is the first full day after your symptoms developed or your test specimen was collected. If you have COVID-19 or have symptoms, isolate for at least 5 days. °IF YOU tested positive for COVID-19 or have symptoms, regardless of  vaccination status °Stay home for at least 5 days °Stay home for 5 days and isolate from others in your home. °Wear a well-fitting mask if you must be around others in your home. °Do not travel. °Ending isolation if you had symptoms °End isolation after 5 full days if you are fever-free for 24 hours (without the use of fever-reducing medication) and your symptoms are improving. °Ending isolation if you did NOT have symptoms °End isolation after at least 5 full days after your positive test. °If you got very sick from COVID-19 or have a weakened immune system °You should isolate for at least 10 days. Consult your doctor before ending isolation. °Take precautions until day 10 °Wear a well-fitting mask °Wear a well-fitting mask for 10 full days any time you are around others inside your home or in public. Do not go to places where you are unable to wear a well-fitting mask. °Do not travel °Do not travel until a full 10 days after your symptoms started or the date your positive test was taken if you had no symptoms. °Avoid being around people who are more likely to get very sick from COVID-19. °Definitions °Exposure °Contact with someone infected with SARS-CoV-2, the virus that causes COVID-19, in a way that increases the likelihood of getting infected with the virus. °Close contact °A close contact is someone who was less than 6 feet away from an infected person (laboratory-confirmed or a clinical diagnosis) for a cumulative total of 15 minutes or more over a 24-hour period. For example, three individual 5-minute exposures for a total of 15 minutes. People who are exposed to someone with COVID-19 after they completed at least 5 days of isolation are not considered close contacts. °Quarantine °Quarantine is a strategy used to prevent transmission of COVID-19 by keeping people who have been in close contact with someone with COVID-19 apart from others. °Who does not need to quarantine? °If you had close contact with  someone with COVID-19 and you are in one of the following groups, you do not need to quarantine. °You are up to date with your COVID-19 vaccines. °You had confirmed COVID-19 within the last 90 days (meaning you tested positive using a viral test). °If you are up to date with COVID-19 vaccines, you should wear a well-fitting mask around others for 10 days from the date of your last close contact with someone with COVID-19 (the date of last close contact is considered day 0). Get tested at least 5 days after you last had close contact with someone with COVID-19. If you test positive or develop COVID-19 symptoms, isolate from other people and follow recommendations in the Isolation section below. If you tested positive for COVID-19 with a viral test within the previous 90 days and subsequently recovered and remain without COVID-19 symptoms, you do not need to quarantine or get tested after close contact. You should wear a well-fitting mask around others for 10 days from the date of your last close contact with someone with COVID-19 (the date of last   close contact is considered day 0). If you have COVID-19 symptoms, get tested and isolate from other people and follow recommendations in the Isolation section below. °Who should quarantine? °If you come into close contact with someone with COVID-19, you should quarantine if you are not up to date on COVID-19 vaccines. This includes people who are not vaccinated. °What to do for quarantine °Stay home and away from other people for at least 5 days (day 0 through day 5) after your last contact with a person who has COVID-19. The date of your exposure is considered day 0. Wear a well-fitting mask when around others at home, if possible. °For 10 days after your last close contact with someone with COVID-19, watch for fever (100.4°F or greater), cough, shortness of breath, or other COVID-19 symptoms. °If you develop symptoms, get tested immediately and isolate until you receive  your test results. If you test positive, follow isolation recommendations. °If you do not develop symptoms, get tested at least 5 days after you last had close contact with someone with COVID-19. °If you test negative, you can leave your home, but continue to wear a well-fitting mask when around others at home and in public until 10 days after your last close contact with someone with COVID-19. °If you test positive, you should isolate for at least 5 days from the date of your positive test (if you do not have symptoms). If you do develop COVID-19 symptoms, isolate for at least 5 days from the date your symptoms began (the date the symptoms started is day 0). Follow recommendations in the isolation section below. °If you are unable to get a test 5 days after last close contact with someone with COVID-19, you can leave your home after day 5 if you have been without COVID-19 symptoms throughout the 5-day period. Wear a well-fitting mask for 10 days after your date of last close contact when around others at home and in public. °Avoid people who are have weakened immune systems or are more likely to get very sick from COVID-19, and nursing homes and other high-risk settings, until after at least 10 days. °If possible, stay away from people you live with, especially people who are at higher risk for getting very sick from COVID-19, as well as others outside your home throughout the full 10 days after your last close contact with someone with COVID-19. °If you are unable to quarantine, you should wear a well-fitting mask for 10 days when around others at home and in public. °If you are unable to wear a mask when around others, you should continue to quarantine for 10 days. Avoid people who have weakened immune systems or are more likely to get very sick from COVID-19, and nursing homes and other high-risk settings, until after at least 10 days. °See additional information about travel. °Do not go to places where you are  unable to wear a mask, such as restaurants and some gyms, and avoid eating around others at home and at work until after 10 days after your last close contact with someone with COVID-19. °After quarantine °Watch for symptoms until 10 days after your last close contact with someone with COVID-19. °If you have symptoms, isolate immediately and get tested. °Quarantine in high-risk congregate settings °In certain congregate settings that have high risk of secondary transmission (such as correctional and detention facilities, homeless shelters, or cruise ships), CDC recommends a 10-day quarantine for residents, regardless of vaccination and booster status. During periods of critical staffing   shortages, facilities may consider shortening the quarantine period for staff to ensure continuity of operations. Decisions to shorten quarantine in these settings should be made in consultation with state, local, tribal, or territorial health departments and should take into consideration the context and characteristics of the facility. CDC's setting-specific guidance provides additional recommendations for these settings. °Isolation °Isolation is used to separate people with confirmed or suspected COVID-19 from those without COVID-19. People who are in isolation should stay home until it's safe for them to be around others. At home, anyone sick or infected should separate from others, or wear a well-fitting mask when they need to be around others. People in isolation should stay in a specific "sick room" or area and use a separate bathroom if available. Everyone who has presumed or confirmed COVID-19 should stay home and isolate from other people for at least 5 full days (day 0 is the first day of symptoms or the date of the day of the positive viral test for asymptomatic persons). They should wear a mask when around others at home and in public for an additional 5 days. People who are confirmed to have COVID-19 or are showing  symptoms of COVID-19 need to isolate regardless of their vaccination status. This includes: °People who have a positive viral test for COVID-19, regardless of whether or not they have symptoms. °People with symptoms of COVID-19, including people who are awaiting test results or have not been tested. People with symptoms should isolate even if they do not know if they have been in close contact with someone with COVID-19. °What to do for isolation °Monitor your symptoms. If you have an emergency warning sign (including trouble breathing), seek emergency medical care immediately. °Stay in a separate room from other household members, if possible. °Use a separate bathroom, if possible. °Take steps to improve ventilation at home, if possible. °Avoid contact with other members of the household and pets. °Don't share personal household items, like cups, towels, and utensils. °Wear a well-fitting mask when you need to be around other people. °Learn more about what to do if you are sick and how to notify your contacts. °Ending isolation for people who had COVID-19 and had symptoms °If you had COVID-19 and had symptoms, isolate for at least 5 days. To calculate your 5-day isolation period, day 0 is your first day of symptoms. Day 1 is the first full day after your symptoms developed. You can leave isolation after 5 full days. °You can end isolation after 5 full days if you are fever-free for 24 hours without the use of fever-reducing medication and your other symptoms have improved (Loss of taste and smell may persist for weeks or months after recovery and need not delay the end of isolation). °You should continue to wear a well-fitting mask around others at home and in public for 5 additional days (day 6 through day 10) after the end of your 5-day isolation period. If you are unable to wear a mask when around others, you should continue to isolate for a full 10 days. Avoid people who have weakened immune systems or are more  likely to get very sick from COVID-19, and nursing homes and other high-risk settings, until after at least 10 days. °If you continue to have fever or your other symptoms have not improved after 5 days of isolation, you should wait to end your isolation until you are fever-free for 24 hours without the use of fever-reducing medication and your other symptoms have improved.   Continue to wear a well-fitting mask through day 10. Contact your healthcare provider if you have questions. °See additional information about travel. °Do not go to places where you are unable to wear a mask, such as restaurants and some gyms, and avoid eating around others at home and at work until a full 10 days after your first day of symptoms. °If an individual has access to a test and wants to test, the best approach is to use an antigen test1 towards the end of the 5-day isolation period. Collect the test sample only if you are fever-free for 24 hours without the use of fever-reducing medication and your other symptoms have improved (loss of taste and smell may persist for weeks or months after recovery and need not delay the end of isolation). If your test result is positive, you should continue to isolate until day 10. If your test result is negative, you can end isolation, but continue to wear a well-fitting mask around others at home and in public until day 10. Follow additional recommendations for masking and avoiding travel as described above. °1As noted in the labeling for authorized over-the counter antigen tests: Negative results should be treated as presumptive. Negative results do not rule out SARS-CoV-2 infection and should not be used as the sole basis for treatment or patient management decisions, including infection control decisions. To improve results, antigen tests should be used twice over a three-day period with at least 24 hours and no more than 48 hours between tests. °Note that these recommendations on ending isolation  do not apply to people who are moderately ill or very sick from COVID-19 or have weakened immune systems. See section below for recommendations for when to end isolation for these groups. °Ending isolation for people who tested positive for COVID-19 but had no symptoms °If you test positive for COVID-19 and never develop symptoms, isolate for at least 5 days. Day 0 is the day of your positive viral test (based on the date you were tested) and day 1 is the first full day after the specimen was collected for your positive test. You can leave isolation after 5 full days. °If you continue to have no symptoms, you can end isolation after at least 5 days. °You should continue to wear a well-fitting mask around others at home and in public until day 10 (day 6 through day 10). If you are unable to wear a mask when around others, you should continue to isolate for 10 days. Avoid people who have weakened immune systems or are more likely to get very sick from COVID-19, and nursing homes and other high-risk settings, until after at least 10 days. °If you develop symptoms after testing positive, your 5-day isolation period should start over. Day 0 is your first day of symptoms. Follow the recommendations above for ending isolation for people who had COVID-19 and had symptoms. °See additional information about travel. °Do not go to places where you are unable to wear a mask, such as restaurants and some gyms, and avoid eating around others at home and at work until 10 days after the day of your positive test. °If an individual has access to a test and wants to test, the best approach is to use an antigen test1 towards the end of the 5-day isolation period. If your test result is positive, you should continue to isolate until day 10. If your test result is positive, you can also choose to test daily and if your test result   is negative, you can end isolation, but continue to wear a well-fitting mask around others at home and in  public until day 10. Follow additional recommendations for masking and avoiding travel as described above. °1As noted in the labeling for authorized over-the counter antigen tests: Negative results should be treated as presumptive. Negative results do not rule out SARS-CoV-2 infection and should not be used as the sole basis for treatment or patient management decisions, including infection control decisions. To improve results, antigen tests should be used twice over a three-day period with at least 24 hours and no more than 48 hours between tests. °Ending isolation for people who were moderately or very sick from COVID-19 or have a weakened immune system °People who are moderately ill from COVID-19 (experiencing symptoms that affect the lungs like shortness of breath or difficulty breathing) should isolate for 10 days and follow all other isolation precautions. To calculate your 10-day isolation period, day 0 is your first day of symptoms. Day 1 is the first full day after your symptoms developed. If you are unsure if your symptoms are moderate, talk to a healthcare provider for further guidance. °People who are very sick from COVID-19 (this means people who were hospitalized or required intensive care or ventilation support) and people who have weakened immune systems might need to isolate at home longer. They may also require testing with a viral test to determine when they can be around others. CDC recommends an isolation period of at least 10 and up to 20 days for people who were very sick from COVID-19 and for people with weakened immune systems. Consult with your healthcare provider about when you can resume being around other people. If you are unsure if your symptoms are severe or if you have a weakened immune system, talk to a healthcare provider for further guidance. °People who have a weakened immune system should talk to their healthcare provider about the potential for reduced immune responses to  COVID-19 vaccines and the need to continue to follow current prevention measures (including wearing a well-fitting mask and avoiding crowds and poorly ventilated indoor spaces) to protect themselves against COVID-19 until advised otherwise by their healthcare provider. Close contacts of immunocompromised people--including household members--should also be encouraged to receive all recommended COVID-19 vaccine doses to help protect these people. °Isolation in high-risk congregate settings °In certain high-risk congregate settings that have high risk of secondary transmission and where it is not feasible to cohort people (such as correctional and detention facilities, homeless shelters, and cruise ships), CDC recommends a 10-day isolation period for residents. During periods of critical staffing shortages, facilities may consider shortening the isolation period for staff to ensure continuity of operations. Decisions to shorten isolation in these settings should be made in consultation with state, local, tribal, or territorial health departments and should take into consideration the context and characteristics of the facility. CDC's setting-specific guidance provides additional recommendations for these settings. °This CDC guidance is meant to supplement--not replace--any federal, state, local, territorial, or tribal health and safety laws, rules, and regulations. °Recommendations for specific settings °These recommendations do not apply to healthcare professionals. For guidance specific to these settings, see °Healthcare professionals: Interim Guidance for Managing Healthcare Personnel with SARS-CoV-2 Infection or Exposure to SARS-CoV-2 °Patients, residents, and visitors to healthcare settings: Interim Infection Prevention and Control Recommendations for Healthcare Personnel During the Coronavirus Disease 2019 (COVID-19) Pandemic °Additional setting-specific guidance and recommendations are available. °These  recommendations on quarantine and isolation do apply to K[employer]12 School   settings. Additional guidance is available here: Overview of COVID-19 Quarantine for K-12 Schools °Travelers: Travel information and recommendations °Congregate facilities and other settings: guidance pages for community, work, and school settings °Ongoing COVID-19 exposure FAQs °I live with someone with COVID-19, but I cannot be separated from them. How do we manage quarantine in this situation? °It is very important for people with COVID-19 to remain apart from other people, if possible, even if they are living together. If separation of the person with COVID-19 from others that they live with is not possible, the other people that they live with will have ongoing exposure, meaning they will be repeatedly exposed until that person is no longer able to spread the virus to other people. In this situation, there are precautions you can take to limit the spread of COVID-19: °The person with COVID-19 and everyone they live with should wear a well-fitting mask inside the home. °If possible, one person should care for the person with COVID-19 to limit the number of people who are in close contact with the infected person. °Take steps to protect yourself and others to reduce transmission in the home: °Quarantine if you are not up to date with your COVID-19 vaccines. °Isolate if you are sick or tested positive for COVID-19, even if you don't have symptoms. °Learn more about the public health recommendations for testing, mask use and quarantine of close contacts, like yourself, who have ongoing exposure. These recommendations differ depending on your vaccination status. °What should I do if I have ongoing exposure to COVID-19 from someone I live with? °Recommendations for this situation depend on your vaccination status: °If you are not up to date on COVID-19 vaccines and have ongoing exposure to COVID-19, you should: °Begin quarantine immediately and  continue to quarantine throughout the isolation period of the person with COVID-19. °Continue to quarantine for an additional 5 days starting the day after the end of isolation for the person with COVID-19. °Get tested at least 5 days after the end of isolation of the infected person that lives with them. °If you test negative, you can leave the home but should continue to wear a well-fitting mask when around others at home and in public until 10 days after the end of isolation for the person with COVID-19. °Isolate immediately if you develop symptoms of COVID-19 or test positive. °If you are up to date with COVID-19 vaccines and have ongoing exposure to COVID-19, you should: °Get tested at least 5 days after your first exposure. A person with COVID-19 is considered infectious starting 2 days before they develop symptoms, or 2 days before the date of their positive test if they do not have symptoms. °Get tested again at least 5 days after the end of isolation for the person with COVID-19. °Wear a well-fitting mask when you are around the person with COVID-19, and do this throughout their isolation period. °Wear a well-fitting mask around others for 10 days after the infected person's isolation period ends. °Isolate immediately if you develop symptoms of COVID-19 or test positive. °What should I do if multiple people I live with test positive for COVID-19 at different times? °Recommendations for this situation depend on your vaccination status: °If you are not up to date with your COVID-19 vaccines, you should: °Quarantine throughout the isolation period of any infected person that you live with. °Continue to quarantine until 5 days after the end of isolation date for the most recently infected person that lives with you. For example, if   the last day of isolation of the person most recently infected with COVID-19 was June 30, the new 5-day quarantine period starts on July 1. °Get tested at least 5 days after the end  of isolation for the most recently infected person that lives with you. °Wear a well-fitting mask when you are around any person with COVID-19 while that person is in isolation. °Wear a well-fitting mask when you are around other people until 10 days after your last close contact. °Isolate immediately if you develop symptoms of COVID-19 or test positive. °If you are up to date with your COVID-19 vaccines, you should: °Get tested at least 5 days after your first exposure. A person with COVID-19 is considered infectious starting 2 days before they developed symptoms, or 2 days before the date of their positive test if they do not have symptoms. °Get tested again at least 5 days after the end of isolation for the most recently infected person that lives with you. °Wear a well-fitting mask when you are around any person with COVID-19 while that person is in isolation. °Wear a well-fitting mask around others for 10 days after the end of isolation for the most recently infected person that lives with you. For example, if the last day of isolation for the person most recently infected with COVID-19 was June 30, the new 10-day period to wear a well-fitting mask indoors in public starts on July 1. °Isolate immediately if you develop symptoms of COVID-19 or test positive. °I had COVID-19 and completed isolation. Do I have to quarantine or get tested if someone I live with gets COVID-19 shortly after I completed isolation? °No. If you recently completed isolation and someone that lives with you tests positive for the virus that causes COVID-19 shortly after the end of your isolation period, you do not have to quarantine or get tested as long as you do not develop new symptoms. Once all of the people that live together have completed isolation or quarantine, refer to the guidance below for new exposures to COVID-19. °If you had COVID-19 in the previous 90 days and then came into close contact with someone with COVID-19, you do  not have to quarantine or get tested if you do not have symptoms. But you should: °Wear a well-fitting mask indoors in public for 10 days after your last close contact. °Monitor for COVID-19 symptoms for 10 days from the date of your last close contact. °Isolate immediately and get tested if symptoms develop. °If more than 90 days have passed since your recovery from infection, follow CDC's recommendations for close contacts. These recommendations will differ depending on your vaccination status. °04/08/2020 °Content source: National Center for Immunization and Respiratory Diseases (NCIRD), Division of Viral Diseases °This information is not intended to replace advice given to you by your health care provider. Make sure you discuss any questions you have with your health care provider. °Document Revised: 08/12/2020 Document Reviewed: 08/12/2020 °Elsevier Patient Education © 2022 Else[employer] Inc. ° °

## 2020-12-08 NOTE — Progress Notes (Signed)
..Virtual Visit via Video Note  I connected with Walter Wilkerson on 12/08/20 at  3:20 PM EST by a video enabled telemedicine application and verified that I am speaking with the correct person using two identifiers.  Location: Patient: home Provider: clinic  .[name] KitchenParticipating in visit:  Patient: Walter Wilkerson Provider: Iran Planas PA-C Provider in training: Haroldine Laws PA-S   I discussed the limitations of evaluation and management by telemedicine and the availability of in person appointments. The patient expressed understanding and agreed to proceed.  History of Present Illness: Pt is a 70 yo male with atherosclerosis, HTN, hx of lung cancer with resection who presents to the clinic on Day 2 of covid symptoms. He tested positive this morning. Right now he is coughing a lot and very fatigued. He has slept quite a bit. The cough can keep him up from time to time. Pt had 2 J and J vaccines for covid. No fever, chills but he does feel achy.   .. Active Ambulatory Problems    Diagnosis Date Noted   Acquired hypothyroidism 08/11/2014   Essential hypertension 08/11/2014   Hyperlipidemia 08/11/2014   Peripheral neuropathy ([place]) 08/11/2014   Non-small cell lung cancer ([place]) 08/11/2014   Shoulder impingement 02/24/2015   Hyperglycemia 04/14/2015   Prediabetes 04/15/2015   Dermatitis 10/18/2015   Cyst of pharynx 01/12/2016   Aortic atherosclerosis ([place]) 01/12/2016   Elevated serum creatinine 02/10/2016   Fatigue 11/04/2016   Stress at work 11/04/2016   Adhesive capsulitis 12/27/2016   Nausea 04/20/2017   Epigastric pain 04/20/2017   PUD (peptic ulcer disease) 04/20/2017   Metatarsal stress fracture of right foot 01/15/2018   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 03/02/2018   Chronic pain syndrome 05/29/2018   Muscle cramps 09/03/2019   Elevated liver enzymes 09/03/2019   Lumbar spondylosis 12/03/2019   Chronic pain of right knee 12/03/2019   Right hand pain 12/03/2019   NSAID  induced gastritis 12/03/2019   Primary osteoarthritis involving multiple joints 12/03/2019   Allergic sinusitis 06/03/2020   Resolved Ambulatory Problems    Diagnosis Date Noted   Acute bronchitis 01/08/2015   Injury of right knee 09/03/2019   Past Medical History:  Diagnosis Date   Hypertension    Lung cancer ([place])    Thyroid disease       Observations/Objective: No acute distress Dry cough No labored breathing  .[name] Kitchen Today's Vitals   12/08/20 1443  Weight: 182 lb (82.6 kg)  Height: 5' 8.5" (1.74 m)   Body mass index is 27.27 kg/m.    Assessment and Plan: [name] KitchenDeonte was seen today for acute visit, nasal congestion, cough, headache, chills and generalized body aches.  Diagnoses and all orders for this visit:  COVID-19 virus infection -     nirmatrelvir/ritonavir EUA (PAXLOVID) 20 x 150 MG & 10 x 100MG  TABS; Take 3 tablets by mouth 2 (two) times daily for 5 days. (Take nirmatrelvir 150 mg two tablets twice daily for 5 days and ritonavir 100 mg one tablet twice daily for 5 days) Patient GFR is 78 -     albuterol (VENTOLIN HFA) 108 (90 Base) MCG/ACT inhaler; Inhale 2 puffs into the lungs every 4 (four) hours as needed.  Acute cough -     nirmatrelvir/ritonavir EUA (PAXLOVID) 20 x 150 MG & 10 x 100MG  TABS; Take 3 tablets by mouth 2 (two) times daily for 5 days. (Take nirmatrelvir 150 mg two tablets twice daily for 5 days and ritonavir 100 mg one tablet twice  daily for 5 days) Patient GFR is 78 -     albuterol (VENTOLIN HFA) 108 (90 Base) MCG/ACT inhaler; Inhale 2 puffs into the lungs every 4 (four) hours as needed.  Day 2 of covid. Pt is high risk for complications.  Start paxlovid.  Albuterol inhaler as needed every 4 hours.  Good deep breaths every hour.  Discussed red flag symptoms and when to go to ED/UC.  Quarantine for 5 days from symptoms. After 5 days if feeling good may wear a mask for 5 more days if not feeling well quarantine for another 5 days.     Follow Up  Instructions:    I discussed the assessment and treatment plan with the patient. The patient was provided an opportunity to ask questions and all were answered. The patient agreed with the plan and demonstrated an understanding of the instructions.   The patient was advised to call back or seek an in-person evaluation if the symptoms worsen or if the condition fails to improve as anticipated.  I provided 15 minutes of non-face-to-face time during this encounter.   Iran Planas, PA-C

## 2021-01-08 ENCOUNTER — Other Ambulatory Visit: Payer: Self-pay | Admitting: Physician Assistant

## 2021-01-08 DIAGNOSIS — G894 Chronic pain syndrome: Secondary | ICD-10-CM

## 2021-01-18 ENCOUNTER — Other Ambulatory Visit: Payer: Self-pay | Admitting: Physician Assistant

## 2021-01-18 DIAGNOSIS — G894 Chronic pain syndrome: Secondary | ICD-10-CM

## 2021-02-09 ENCOUNTER — Other Ambulatory Visit: Payer: Self-pay

## 2021-02-09 ENCOUNTER — Encounter: Payer: Self-pay | Admitting: Physician Assistant

## 2021-02-09 ENCOUNTER — Ambulatory Visit (INDEPENDENT_AMBULATORY_CARE_PROVIDER_SITE_OTHER): Payer: Medicare Other | Admitting: Physician Assistant

## 2021-02-09 VITALS — BP 160/82 | HR 77 | Ht 68.0 in | Wt 192.0 lb

## 2021-02-09 DIAGNOSIS — I10 Essential (primary) hypertension: Secondary | ICD-10-CM | POA: Diagnosis not present

## 2021-02-09 DIAGNOSIS — Z125 Encounter for screening for malignant neoplasm of prostate: Secondary | ICD-10-CM

## 2021-02-09 DIAGNOSIS — E039 Hypothyroidism, unspecified: Secondary | ICD-10-CM

## 2021-02-09 DIAGNOSIS — G629 Polyneuropathy, unspecified: Secondary | ICD-10-CM

## 2021-02-09 DIAGNOSIS — M130 Polyarthritis, unspecified: Secondary | ICD-10-CM

## 2021-02-09 DIAGNOSIS — E782 Mixed hyperlipidemia: Secondary | ICD-10-CM

## 2021-02-09 DIAGNOSIS — Z0000 Encounter for general adult medical examination without abnormal findings: Secondary | ICD-10-CM | POA: Diagnosis not present

## 2021-02-09 DIAGNOSIS — M1711 Unilateral primary osteoarthritis, right knee: Secondary | ICD-10-CM | POA: Diagnosis not present

## 2021-02-09 MED ORDER — PREDNISONE 1 MG PO TABS: 1.0000 mg | ORAL_TABLET | Freq: Every day | ORAL | 0 refills | Status: DC

## 2021-02-09 MED ORDER — HYDRALAZINE HCL 25 MG PO TABS: 25.0000 mg | ORAL_TABLET | Freq: Two times a day (BID) | ORAL | 0 refills | Status: DC

## 2021-02-09 NOTE — Patient Instructions (Addendum)
Stop cymbalta. Go to every other day and then stop after 7 days.  Increase gabapentin to 900mg  three times a day.  Increase celebrex to 200mg  twice a day.  Start prednisone 1mg  daily.  Get labs at end of feb.

## 2021-02-09 NOTE — Progress Notes (Signed)
Subjective:    Patient ID: Walter Wilkerson, male    DOB: 1950/09/03, 71 y.o.   MRN: [id number]  HPI Pt is a 71 yo male with HTN, hypothyroidism, hx of lung cancer, peripheral neuropathy, multiple joint arthritis who presents to the clinic for medication refills and follow up.   Pt continues to have lots of joint pain all over his body. Norco stopped helping. On gabapentin which helps some. NSAIDs do not seem to help anymore. He is taking celexbrex. Pain worse Joints in hands, wrist, elbows, knees ache.    .. Active Ambulatory Problems    Diagnosis Date Noted   Acquired hypothyroidism 08/11/2014   Essential hypertension 08/11/2014   Hyperlipidemia 08/11/2014   Peripheral neuropathy ([place]) 08/11/2014   Non-small cell lung cancer ([place]) 08/11/2014   Shoulder impingement 02/24/2015   Hyperglycemia 04/14/2015   Prediabetes 04/15/2015   Dermatitis 10/18/2015   Cyst of pharynx 01/12/2016   Aortic atherosclerosis ([place]) 01/12/2016   Elevated serum creatinine 02/10/2016   Fatigue 11/04/2016   Stress at work 11/04/2016   Adhesive capsulitis 12/27/2016   Nausea 04/20/2017   Epigastric pain 04/20/2017   PUD (peptic ulcer disease) 04/20/2017   Metatarsal stress fracture of right foot 01/15/2018   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 03/02/2018   Chronic pain syndrome 05/29/2018   Muscle cramps 09/03/2019   Elevated liver enzymes 09/03/2019   Lumbar spondylosis 12/03/2019   Chronic pain of right knee 12/03/2019   Right hand pain 12/03/2019   NSAID induced gastritis 12/03/2019   Primary osteoarthritis involving multiple joints 12/03/2019   Allergic sinusitis 06/03/2020   Polyarthritis 02/16/2021   Resolved Ambulatory Problems    Diagnosis Date Noted   Acute bronchitis 01/08/2015   Injury of right knee 09/03/2019   Past Medical History:  Diagnosis Date   Hypertension    Lung cancer ([place])    Thyroid disease    .[name] Kitchen Family History  Problem Relation Age of Onset   Heart  attack Father    Hyperlipidemia Father    Hypertension Father    Heart disease Father    Diabetes Other        grandmother    .[name] Kitchen Social History   Socioeconomic History   Marital status: Married    Spouse name: [name]   Number of children: 1   Years of education: 12   Highest education level: 12th grade  Occupational History   Occupation: [employer]    Comment: part time  Tobacco Use   Smoking status: Former    Types: Cigarettes    Quit date: 08/10/2004    Years since quitting: 16.5   Smokeless tobacco: Never  Vaping Use   Vaping Use: Never used  Substance and Sexual Activity   Alcohol use: Not Currently    Alcohol/week: 0.0 standard drinks   Drug use: No   Sexual activity: Not Currently    Partners: Female  Other Topics Concern   Not on file  Social History Narrative    Exercises at gym 3 times a week walking and weights. Drinks 3-4 coffee a day. 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   Social Determinants of Health   Financial Resource Strain: Not on file  Food Insecurity: Not on file  Transportation Needs: Not on file  Physical Activity: Not on file  Stress: Not on file  Social Connections: Not on file  Intimate Partner Violence: Not on file     Review of Systems See HPI.  Objective:   Physical Exam  NSR Hands swollen and red with large knukes.        .. Depression screen [facility] 2/9 12/08/2020 12/24/2018 11/14/2018 01/15/2018 12/18/2017  Decreased Interest 0 0 0 0 0  Down, Depressed, Hopeless 0 0 0 0 0  PHQ - 2 Score 0 0 0 0 0  Altered sleeping 0 1 0 - 0  Tired, decreased energy 1 0 0 - 1  Change in appetite 0 0 0 - 0  Feeling bad or failure about yourself  1 0 0 - 0  Trouble concentrating 0 0 0 - 0  Moving slowly or fidgety/restless 0 0 0 - 0  Suicidal thoughts 0 0 0 - 0  PHQ-9 Score 2 1 0 - 1  Difficult doing work/chores Not difficult at all Not difficult at all Not difficult at all - Not difficult at all   .[name] Kitchen GAD 7 :  Generalized Anxiety Score 11/14/2018 01/15/2018 12/04/2017  Nervous, Anxious, on Edge 0 0 0  Control/stop worrying 0 0 0  Worry too much - different things 0 0 0  Trouble relaxing 0 0 0  Restless 0 0 0  Easily annoyed or irritable 0 0 0  Afraid - awful might happen 0 0 0  Total GAD 7 Score 0 0 0  Anxiety Difficulty Not difficult at all Not difficult at all Not difficult at all     Assessment & Plan:  [name] KitchenClarke was seen today for follow-up.  Diagnoses and all orders for this visit:  Preventative health care -     CBC with Differential -     COMPLETE METABOLIC PANEL WITH GFR -     Lipid Panel w/reflex Direct LDL -     Hemoglobin A1c -     TSH -     PSA  Peripheral polyneuropathy -     COMPLETE METABOLIC PANEL WITH GFR  Essential hypertension -     hydrALAZINE (APRESOLINE) 25 MG tablet; Take 1 tablet (25 mg total) by mouth 2 (two) times daily.  Mixed hyperlipidemia -     Lipid Panel w/reflex Direct LDL  Acquired hypothyroidism -     TSH  Polyarthritis -     CBC with Differential -     COMPLETE METABOLIC PANEL WITH GFR -     predniSONE (DELTASONE) 1 MG tablet; Take 1 tablet (1 mg total) by mouth daily with breakfast.  Prostate cancer screening -     PSA  Discussed 150 minutes of exercise a week.  Encouraged vitamin D 1000 units and Calcium 1300mg  or 4 servings of dairy a day.  Fasting labs ordered to get before month is over  BP not to goal. Continue diovan/HCTZ and norvasc. Added hydralazine bid.  Follow up BP one month.   For polyarthritis nothing has been working.  Stop cymbalta. Go every other day then stop.  Increase gabapentin 900mg  TID.  Increase celebrex to 200mg  bid.  Added prednisone 1mg  daily to see if would help.  Get labs in 4-6 weeks.

## 2021-02-16 ENCOUNTER — Encounter: Payer: Self-pay | Admitting: Physician Assistant

## 2021-02-16 DIAGNOSIS — M130 Polyarthritis, unspecified: Secondary | ICD-10-CM | POA: Insufficient documentation

## 2021-02-19 ENCOUNTER — Other Ambulatory Visit: Payer: Self-pay | Admitting: Physician Assistant

## 2021-02-19 DIAGNOSIS — E782 Mixed hyperlipidemia: Secondary | ICD-10-CM

## 2021-02-23 DIAGNOSIS — M1711 Unilateral primary osteoarthritis, right knee: Secondary | ICD-10-CM | POA: Diagnosis not present

## 2021-02-26 DIAGNOSIS — E039 Hypothyroidism, unspecified: Secondary | ICD-10-CM | POA: Diagnosis not present

## 2021-02-26 DIAGNOSIS — R7303 Prediabetes: Secondary | ICD-10-CM | POA: Diagnosis not present

## 2021-02-26 DIAGNOSIS — E785 Hyperlipidemia, unspecified: Secondary | ICD-10-CM | POA: Diagnosis not present

## 2021-02-26 DIAGNOSIS — Z0000 Encounter for general adult medical examination without abnormal findings: Secondary | ICD-10-CM | POA: Diagnosis not present

## 2021-02-27 LAB — CBC WITH DIFFERENTIAL/PLATELET
Absolute Monocytes: 497 cells/uL (ref 200–950)
Basophils Absolute: 69 cells/uL (ref 0–200)
Basophils Relative: 1 %
Eosinophils Absolute: 152 cells/uL (ref 15–500)
Eosinophils Relative: 2.2 %
HCT: 36.4 % — ABNORMAL LOW (ref 38.5–50.0)
Hemoglobin: 12.1 g/dL — ABNORMAL LOW (ref 13.2–17.1)
Lymphs Abs: 1421 cells/uL (ref 850–3900)
MCH: 31.3 pg (ref 27.0–33.0)
MCHC: 33.2 g/dL (ref 32.0–36.0)
MCV: 94.3 fL (ref 80.0–100.0)
MPV: 13.3 fL — ABNORMAL HIGH (ref 7.5–12.5)
Monocytes Relative: 7.2 %
Neutro Abs: 4761 cells/uL (ref 1500–7800)
Neutrophils Relative %: 69 %
Platelets: 34 10*3/uL — ABNORMAL LOW (ref 140–400)
RBC: 3.86 10*6/uL — ABNORMAL LOW (ref 4.20–5.80)
RDW: 13.6 % (ref 11.0–15.0)
Total Lymphocyte: 20.6 %
WBC: 6.9 10*3/uL (ref 3.8–10.8)

## 2021-02-27 LAB — COMPLETE METABOLIC PANEL WITH GFR
AG Ratio: 1.2 (calc) (ref 1.0–2.5)
ALT: 14 U/L (ref 9–46)
AST: 14 U/L (ref 10–35)
Albumin: 3.8 g/dL (ref 3.6–5.1)
Alkaline phosphatase (APISO): 80 U/L (ref 35–144)
BUN/Creatinine Ratio: 21 (calc) (ref 6–22)
BUN: 26 mg/dL — ABNORMAL HIGH (ref 7–25)
CO2: 22 mmol/L (ref 20–32)
Calcium: 9.4 mg/dL (ref 8.6–10.3)
Chloride: 106 mmol/L (ref 98–110)
Creat: 1.26 mg/dL (ref 0.70–1.28)
Globulin: 3.2 g/dL (calc) (ref 1.9–3.7)
Glucose, Bld: 88 mg/dL (ref 65–99)
Potassium: 4.3 mmol/L (ref 3.5–5.3)
Sodium: 139 mmol/L (ref 135–146)
Total Bilirubin: 0.3 mg/dL (ref 0.2–1.2)
Total Protein: 7 g/dL (ref 6.1–8.1)
eGFR: 61 mL/min/{1.73_m2} (ref >=60)

## 2021-02-27 LAB — HEMOGLOBIN A1C
Hgb A1c MFr Bld: 5.5 % of total Hgb (ref <5.7)
Mean Plasma Glucose: 111 mg/dL
eAG (mmol/L): 6.2 mmol/L

## 2021-02-27 LAB — LIPID PANEL W/REFLEX DIRECT LDL
Cholesterol: 215 mg/dL — ABNORMAL HIGH (ref <200)
HDL: 127 mg/dL (ref >=40)
LDL Cholesterol (Calc): 74 mg/dL (calc)
Non-HDL Cholesterol (Calc): 88 mg/dL (calc) (ref <130)
Total CHOL/HDL Ratio: 1.7 (calc) (ref <5.0)
Triglycerides: 60 mg/dL (ref <150)

## 2021-02-27 LAB — TSH: TSH: 0.78 mIU/L (ref 0.40–4.50)

## 2021-02-27 LAB — PSA: PSA: 1.27 ng/mL (ref <=4.00)

## 2021-03-01 NOTE — Progress Notes (Signed)
PSA normal.  Thyroid looks good.  A1C stable. Normal range.  HDL, good cholesterol, looks amazing! LDL, bad cholesterol, to goal.  Kidneys a little dry. Make sure drinking enough water.  I would start taking iron 325mg  every morning. Recheck CBC in 3 months.

## 2021-03-09 ENCOUNTER — Other Ambulatory Visit: Payer: Self-pay

## 2021-03-09 ENCOUNTER — Ambulatory Visit (INDEPENDENT_AMBULATORY_CARE_PROVIDER_SITE_OTHER): Payer: Medicare Other | Admitting: Physician Assistant

## 2021-03-09 ENCOUNTER — Encounter: Payer: Self-pay | Admitting: Physician Assistant

## 2021-03-09 VITALS — BP 128/74 | HR 82 | Ht 68.0 in | Wt 186.0 lb

## 2021-03-09 DIAGNOSIS — M130 Polyarthritis, unspecified: Secondary | ICD-10-CM

## 2021-03-09 DIAGNOSIS — I10 Essential (primary) hypertension: Secondary | ICD-10-CM

## 2021-03-09 DIAGNOSIS — G894 Chronic pain syndrome: Secondary | ICD-10-CM | POA: Diagnosis not present

## 2021-03-09 MED ORDER — PREDNISONE 1 MG PO TABS: 1.0000 mg | ORAL_TABLET | Freq: Every day | ORAL | 1 refills | Status: DC

## 2021-03-09 MED ORDER — HYDRALAZINE HCL 25 MG PO TABS: 25.0000 mg | ORAL_TABLET | Freq: Two times a day (BID) | ORAL | 1 refills | Status: DC

## 2021-03-09 NOTE — Progress Notes (Signed)
Subjective:    Patient ID: Walter Wilkerson, male    DOB: Feb 17, 1950, 71 y.o.   MRN: [id number]  HPI Pt is a 71 yo male with chronic pain due to polyarthritis, HTN, HLD who presents to the clinic for follow up.   He is checking is BP at home and ranging 120s over 70s. He added hydralazine to norvasc, diovan, HCTZ. He denies any CP, palpitations, headaches, vision changes or dizziness. He feels really good.   He added prednisone and Dr. Berenice Wilkerson replaced celebrex with diclofenac. He is noticed a lot of improvement in pain and stiffiness. Decreased from 8/10 to 2/10. He stopped gabapentin as well because thought he was supposed too.   .. Active Ambulatory Problems    Diagnosis Date Noted   Acquired hypothyroidism 08/11/2014   Essential hypertension 08/11/2014   Hyperlipidemia 08/11/2014   Peripheral neuropathy ([place]) 08/11/2014   Non-small cell lung cancer ([place]) 08/11/2014   Shoulder impingement 02/24/2015   Hyperglycemia 04/14/2015   Prediabetes 04/15/2015   Dermatitis 10/18/2015   Cyst of pharynx 01/12/2016   Aortic atherosclerosis ([place]) 01/12/2016   Elevated serum creatinine 02/10/2016   Fatigue 11/04/2016   Stress at work 11/04/2016   Adhesive capsulitis 12/27/2016   Nausea 04/20/2017   Epigastric pain 04/20/2017   PUD (peptic ulcer disease) 04/20/2017   Metatarsal stress fracture of right foot 01/15/2018   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 03/02/2018   Chronic pain syndrome 05/29/2018   Muscle cramps 09/03/2019   Elevated liver enzymes 09/03/2019   Lumbar spondylosis 12/03/2019   Chronic pain of right knee 12/03/2019   Right hand pain 12/03/2019   NSAID induced gastritis 12/03/2019   Primary osteoarthritis involving multiple joints 12/03/2019   Allergic sinusitis 06/03/2020   Polyarthritis 02/16/2021   Resolved Ambulatory Problems    Diagnosis Date Noted   Acute bronchitis 01/08/2015   Injury of right knee 09/03/2019   Past Medical History:  Diagnosis  Date   Hypertension    Lung cancer ([place])    Thyroid disease       Review of Systems    See HPI.  Objective:   Physical Exam Vitals reviewed.  Constitutional:      Appearance: Normal appearance.  HENT:     Head: Normocephalic.  Cardiovascular:     Rate and Rhythm: Normal rate and regular rhythm.     Pulses: Normal pulses.  Pulmonary:     Effort: Pulmonary effort is normal.  Neurological:     General: No focal deficit present.     Mental Status: He is alert and oriented to person, place, and time.  Psychiatric:        Mood and Affect: Mood normal.          Assessment & Plan:  .Walter Wilkerson was seen today for hypertension.  Diagnoses and all orders for this visit:  Essential hypertension -     hydrALAZINE (APRESOLINE) 25 MG tablet; Take 1 tablet (25 mg total) by mouth 2 (two) times daily.  Polyarthritis -     predniSONE (DELTASONE) 1 MG tablet; Take 1 tablet (1 mg total) by mouth daily with breakfast.  Chronic pain syndrome   2nd BP check improved. Home BP checks look great.  Continue on norvasc/diovan/HCTZ/hydralazine. Follow up in 6 months.   Consider adding back in gabapentin for pain.  Continue off cymbalta and celebrex.  Continue on prednisone and diclofenac.  ? If prednisone or diclofenac helped more. Consider trial off prednisone.  Encouraged to continue omeprazole.

## 2021-03-09 NOTE — Patient Instructions (Addendum)
Do not take celebrex.  Consider adding back gabapentin if needed for pain.   Omeprazole is what helps protect stomach for diclofenac and prednisone.   Consider trial off prednisone to see if actually helping with arthritis pain

## 2021-03-15 DIAGNOSIS — R112 Nausea with vomiting, unspecified: Secondary | ICD-10-CM | POA: Diagnosis not present

## 2021-03-19 ENCOUNTER — Encounter: Payer: Self-pay | Admitting: Medical-Surgical

## 2021-03-19 ENCOUNTER — Other Ambulatory Visit: Payer: Self-pay

## 2021-03-19 ENCOUNTER — Ambulatory Visit (INDEPENDENT_AMBULATORY_CARE_PROVIDER_SITE_OTHER): Payer: Medicare Other | Admitting: Medical-Surgical

## 2021-03-19 ENCOUNTER — Ambulatory Visit (INDEPENDENT_AMBULATORY_CARE_PROVIDER_SITE_OTHER): Payer: Medicare Other

## 2021-03-19 VITALS — BP 154/81 | HR 85 | Temp 98.4°F | Ht 68.0 in | Wt 186.0 lb

## 2021-03-19 DIAGNOSIS — I700 Atherosclerosis of aorta: Secondary | ICD-10-CM | POA: Diagnosis not present

## 2021-03-19 DIAGNOSIS — D7389 Other diseases of spleen: Secondary | ICD-10-CM | POA: Diagnosis not present

## 2021-03-19 DIAGNOSIS — R1031 Right lower quadrant pain: Secondary | ICD-10-CM

## 2021-03-19 DIAGNOSIS — N281 Cyst of kidney, acquired: Secondary | ICD-10-CM | POA: Diagnosis not present

## 2021-03-19 DIAGNOSIS — R1011 Right upper quadrant pain: Secondary | ICD-10-CM

## 2021-03-19 DIAGNOSIS — R110 Nausea: Secondary | ICD-10-CM

## 2021-03-19 MED ORDER — PROMETHAZINE HCL 12.5 MG PO TABS: 12.5000 mg | ORAL_TABLET | Freq: Three times a day (TID) | ORAL | 0 refills | Status: DC | PRN

## 2021-03-19 MED ORDER — FAMOTIDINE 20 MG PO TABS: 20.0000 mg | ORAL_TABLET | Freq: Two times a day (BID) | ORAL | 1 refills | Status: DC

## 2021-03-19 NOTE — Progress Notes (Signed)
?  HPI with pertinent ROS:  ? ?CC: Abdominal pain ? ?HPI: ?Pleasant 71 year old male presenting today for evaluation of abdominal pain.  Notes that on Sunday this past weekend he was feeling significantly tired and more fatigued than usual.  He awoke on Monday with symptoms including headache, nausea, vomiting, dry heaves, severe fatigue, diarrhea, and generalized malaise.  He ended up going to urgent care who diagnosed him with viral gastroenteritis and gave him Zofran.  He notes that the Zofran helped with the vomiting but did not stop the nausea.  He has been belching a lot which makes him feel like he is going to throw up.  He had diarrhea for the first couple of days but has had no further issues with diarrhea.  Notes that lying down and resting makes his fatigue feel better although it does not help with stomach pain.  He is unable to tolerate solid foods and has been drinking water and trying small bits of soup for nourishment.  He is seeing no blood in his emesis or his stool.  Reports drinking alcohol, 1-2 cocktails nightly but has not had any in the past 2 weeks.  Denies fever, chills, chest pain, shortness of breath, and dizziness. ? ?I reviewed the past medical history, family history, social history, surgical history, and allergies today and no changes were needed.  Please see the problem list section below in epic for further details. ? ? ?Physical exam:  ? ?General: Well Developed, well nourished, and in no acute distress.  ?Neuro: Alert and oriented x3.  ?HEENT: Normocephalic, atraumatic.  ?Skin: Warm and dry. ?Cardiac: Regular rate and rhythm, no murmurs rubs or gallops, no lower extremity edema.  ?Respiratory: Clear to auscultation bilaterally. Not using accessory muscles, speaking in full sentences. ?Abdomen: Soft, tenderness to RUQ/RLQ, nontender left upper and lower quadrants, nondistended. Bowel sounds + x 4 quadrants. No HSM appreciated. ? ?Impression and Recommendations:   ? ?1. RUQ pain ?2.   Nausea ?3.  RLQ abdominal pain ?Checking labs as below.  Stat abdominal ultrasound.  Concern for gallbladder etiology versus possible pancreatitis.  Sending in Phenergan 12.5 mg every 8 hours as needed for nausea since Zofran is not working well.  Continue omeprazole 40 mg daily.  Start famotidine 20 mg twice daily. ?- US Abdomen Complete; Future ?- CBC with Differential/Platelet ?- COMPLETE METABOLIC PANEL WITH GFR ?- Amylase ?- Lipase ?- famotidine (PEPCID) 20 MG tablet; Take 1 tablet (20 mg total) by mouth 2 (two) times daily.  Dispense: 60 tablet; Refill: 1 ? ?Return if symptoms worsen or fail to improve.  Further follow-up pending imaging and lab work. ?___________________________________________ ?Clearnce Sorrel, DNP, APRN, FNP-BC ?Primary Care and Sports Medicine ?[place] ?

## 2021-03-20 LAB — CBC WITH DIFFERENTIAL/PLATELET
Absolute Monocytes: 1009 cells/uL — ABNORMAL HIGH (ref 200–950)
Basophils Absolute: 70 cells/uL (ref 0–200)
Basophils Relative: 0.8 %
Eosinophils Absolute: 252 cells/uL (ref 15–500)
Eosinophils Relative: 2.9 %
HCT: 38.8 % (ref 38.5–50.0)
Hemoglobin: 13 g/dL — ABNORMAL LOW (ref 13.2–17.1)
Lymphs Abs: 1314 cells/uL (ref 850–3900)
MCH: 31.9 pg (ref 27.0–33.0)
MCHC: 33.5 g/dL (ref 32.0–36.0)
MCV: 95.1 fL (ref 80.0–100.0)
MPV: 12.7 fL — ABNORMAL HIGH (ref 7.5–12.5)
Monocytes Relative: 11.6 %
Neutro Abs: 6055 cells/uL (ref 1500–7800)
Neutrophils Relative %: 69.6 %
Platelets: 91 10*3/uL — ABNORMAL LOW (ref 140–400)
RBC: 4.08 10*6/uL — ABNORMAL LOW (ref 4.20–5.80)
RDW: 12.9 % (ref 11.0–15.0)
Total Lymphocyte: 15.1 %
WBC: 8.7 10*3/uL (ref 3.8–10.8)

## 2021-03-20 LAB — COMPLETE METABOLIC PANEL WITH GFR
AG Ratio: 1.3 (calc) (ref 1.0–2.5)
ALT: 18 U/L (ref 9–46)
AST: 15 U/L (ref 10–35)
Albumin: 3.8 g/dL (ref 3.6–5.1)
Alkaline phosphatase (APISO): 81 U/L (ref 35–144)
BUN/Creatinine Ratio: 12 (calc) (ref 6–22)
BUN: 18 mg/dL (ref 7–25)
CO2: 29 mmol/L (ref 20–32)
Calcium: 9.6 mg/dL (ref 8.6–10.3)
Chloride: 99 mmol/L (ref 98–110)
Creat: 1.52 mg/dL — ABNORMAL HIGH (ref 0.70–1.28)
Globulin: 3 g/dL (calc) (ref 1.9–3.7)
Glucose, Bld: 115 mg/dL — ABNORMAL HIGH (ref 65–99)
Potassium: 5.1 mmol/L (ref 3.5–5.3)
Sodium: 134 mmol/L — ABNORMAL LOW (ref 135–146)
Total Bilirubin: 0.5 mg/dL (ref 0.2–1.2)
Total Protein: 6.8 g/dL (ref 6.1–8.1)
eGFR: 49 mL/min/{1.73_m2} — ABNORMAL LOW (ref >=60)

## 2021-03-20 LAB — AMYLASE: Amylase: 65 U/L (ref 21–101)

## 2021-03-20 LAB — LIPASE: Lipase: 84 U/L — ABNORMAL HIGH (ref 7–60)

## 2021-03-22 ENCOUNTER — Other Ambulatory Visit: Payer: Self-pay

## 2021-03-22 DIAGNOSIS — N289 Disorder of kidney and ureter, unspecified: Secondary | ICD-10-CM

## 2021-03-22 NOTE — Progress Notes (Signed)
Ordered per labs.  ?

## 2021-03-29 ENCOUNTER — Other Ambulatory Visit: Payer: Self-pay | Admitting: Physician Assistant

## 2021-03-29 ENCOUNTER — Other Ambulatory Visit: Payer: Self-pay | Admitting: Medical-Surgical

## 2021-03-29 DIAGNOSIS — G894 Chronic pain syndrome: Secondary | ICD-10-CM

## 2021-03-29 DIAGNOSIS — R110 Nausea: Secondary | ICD-10-CM

## 2021-03-30 ENCOUNTER — Other Ambulatory Visit: Payer: Self-pay | Admitting: Neurology

## 2021-03-30 DIAGNOSIS — M25561 Pain in right knee: Secondary | ICD-10-CM

## 2021-03-30 DIAGNOSIS — G894 Chronic pain syndrome: Secondary | ICD-10-CM

## 2021-03-30 DIAGNOSIS — M79641 Pain in right hand: Secondary | ICD-10-CM

## 2021-03-30 DIAGNOSIS — M25552 Pain in left hip: Secondary | ICD-10-CM

## 2021-03-30 MED ORDER — DICLOFENAC SODIUM 1 % EX GEL: 4.0000 g | Freq: Four times a day (QID) | CUTANEOUS | 5 refills | Status: DC

## 2021-03-31 ENCOUNTER — Other Ambulatory Visit: Payer: Self-pay | Admitting: Physician Assistant

## 2021-03-31 NOTE — Telephone Encounter (Signed)
Gel on the med list but tablets not on list any longer.  ?Last written 2017, please advise.  ?

## 2021-04-12 ENCOUNTER — Ambulatory Visit (INDEPENDENT_AMBULATORY_CARE_PROVIDER_SITE_OTHER): Payer: Medicare Other | Admitting: Physician Assistant

## 2021-04-12 DIAGNOSIS — Z0000 Encounter for general adult medical examination without abnormal findings: Secondary | ICD-10-CM

## 2021-04-12 NOTE — Progress Notes (Signed)
? ? ?MEDICARE ANNUAL WELLNESS VISIT ? ?04/12/2021 ? ?Telephone Visit Disclaimer ?This Medicare AWV was conducted by telephone due to national recommendations for restrictions regarding the COVID-19 Pandemic (e.g. social distancing).  I verified, using two identifiers, that I am speaking with Walter Wilkerson or their authorized healthcare agent. I discussed the limitations, risks, security, and privacy concerns of performing an evaluation and management service by telephone and the potential availability of an in-person appointment in the future. The patient expressed understanding and agreed to proceed.  ?Location of Patient: Home ?Location of Provider (nurse):  Provider home ? ?Subjective:  ? ? ?Walter Wilkerson is a 71 y.o. male patient of [name], PA-C who had a [employer] Visit today via telephone. [place] is Working part time and lives with their spouse and son. he has 3 children. he reports that he is socially active and does interact with friends/family regularly. he is minimally physically active and enjoys gardening. ? ?Patient Care Team: ?Lavada Mesi as PCP - General (Family Medicine) ?Dorna Leitz, MD as Consulting Physician (Orthopedic Surgery) ? ? ?  04/12/2021  ?  8:05 AM 12/24/2018  ?  8:06 AM 12/18/2017  ?  8:09 AM 12/30/2016  ? 10:24 AM 08/11/2014  ?  1:42 PM  ?Advanced Directives  ?Does Patient Have a Medical Advance Directive? Yes Yes Yes Yes Yes  ?Type of Advance Directive Living will Living will [place];Living will Living will Living will  ?Does patient want to make changes to medical advance directive? No - Patient declined No - Patient declined No - Patient declined  No - Patient declined  ?Copy of [name] in Chart?   No - copy requested    ? ? ?Hospital Utilization Over the Past 12 Months: ?# of hospitalizations or ER visits: 1 ?# of surgeries: 1 ? ?Review of Systems    ?Patient reports that his overall health is unchanged  compared to last year. ? ?History obtained from chart review and the patient ? ?Patient Reported Readings (BP, Pulse, CBG, Weight, etc) ?none ? ?Pain Assessment ?Pain : No/denies pain ? ?  ? ?Current Medications & Allergies (verified) ?Allergies as of 04/12/2021   ? ?   Reactions  ? Merbromin Hives  ? Hives  ? Thimerosal Hives  ? Lipitor [atorvastatin Calcium]   ? Myalgias.   ? Lisinopril Other (See Comments)  ? Pregabalin Other (See Comments)  ? Elevated LFTs  ? ?  ? ?  ?Medication List  ?  ? ?  ? Accurate as of April 12, 2021  8:12 AM. If you have any questions, ask your nurse or doctor.  ?  ?  ? ?  ? ?albuterol 108 (90 Base) MCG/ACT inhaler ?Commonly known as: VENTOLIN HFA ?Inhale 2 puffs into the lungs every 4 (four) hours as needed. ?  ?amLODipine 10 MG tablet ?Commonly known as: NORVASC ?Take 1 tablet (10 mg total) by mouth daily. ?  ?aspirin EC 81 MG tablet ?Take 81 mg by mouth daily. ?  ?atorvastatin 40 MG tablet ?Commonly known as: LIPITOR ?Take 1 tablet (40 mg total) by mouth daily. Needs lab work for refills ?  ?diclofenac 75 MG EC tablet ?Commonly known as: VOLTAREN ?Take 1 tablet by mouth twice a day ?  ?diclofenac Sodium 1 % Gel ?Commonly known as: VOLTAREN ?Apply 4 g topically 4 (four) times daily. To affected joint. ?  ?famotidine 20 MG tablet ?Commonly known as: PEPCID ?Take 1 tablet (20  mg total) by mouth 2 (two) times daily. ?  ?hydrALAZINE 25 MG tablet ?Commonly known as: APRESOLINE ?Take 1 tablet (25 mg total) by mouth 2 (two) times daily. ?  ?levothyroxine 150 MCG tablet ?Commonly known as: SYNTHROID ?Take 1 tablet (150 mcg total) by mouth daily before breakfast. Labs for refills ?  ?multivitamin tablet ?Take 1 tablet by mouth daily. ?  ?omeprazole 40 MG capsule ?Commonly known as: PRILOSEC ?Take 1 capsule (40 mg total) by mouth daily. ?  ?predniSONE 1 MG tablet ?Commonly known as: DELTASONE ?Take 1 tablet (1 mg total) by mouth daily with breakfast. ?  ?promethazine 12.5 MG tablet ?Commonly known  as: PHENERGAN ?Take 1 tablet (12.5 mg total) by mouth every 8 (eight) hours as needed for nausea or vomiting. ?  ?valsartan-hydrochlorothiazide 320-25 MG tablet ?Commonly known as: DIOVAN-HCT ?Take 1 tablet by mouth daily. ?  ? ?  ? ? ?History (reviewed): ?Past Medical History:  ?Diagnosis Date  ? Hypertension   ? Lung cancer ([place])   ? Thyroid disease   ? ?Past Surgical History:  ?Procedure Laterality Date  ? LUNG LOBECTOMY Right 2008  ? RLL resection  ? VASECTOMY    ? ?Family History  ?Problem Relation Age of Onset  ? Heart attack Father   ? Hyperlipidemia Father   ? Hypertension Father   ? Heart disease Father   ? Diabetes Other   ?     grandmother   ? ?Social History  ? ?Socioeconomic History  ? Marital status: Married  ?  Spouse name: Walter Wilkerson  ? Number of children: 3  ? Years of education: 74  ? Highest education level: 12th grade  ?Occupational History  ? Occupation: [employer]  ?  Comment: part time  ?Tobacco Use  ? Smoking status: Former  ?  Types: Cigarettes  ?  Quit date: 08/10/2004  ?  Years since quitting: 16.6  ? Smokeless tobacco: Never  ?Vaping Use  ? Vaping Use: Never used  ?Substance and Sexual Activity  ? Alcohol use: Not Currently  ?  Alcohol/week: 21.0 standard drinks  ?  Types: 21 Shots of liquor per week  ?  Comment: 3 shots every night  ? Drug use: No  ? Sexual activity: Not Currently  ?  Partners: Female  ?Other Topics Concern  ? Not on file  ?Social History Narrative  ? Lives with his wife and son. He enjoys working outside and doing Haematologist.  ? ?Social Determinants of Health  ? ?Financial Resource Strain: Low Risk   ? Difficulty of Paying Living Expenses: Not hard at all  ?Food Insecurity: No Food Insecurity  ? Worried About Charity fundraiser in the Last Year: Never true  ? Ran Out of Food in the Last Year: Never true  ?Transportation Needs: No Transportation Needs  ? Lack of Transportation (Medical): No  ? Lack of Transportation (Non-Medical): No  ?Physical Activity: Inactive  ? Days  of Exercise per Week: 0 days  ? Minutes of Exercise per Session: 0 min  ?Stress: No Stress Concern Present  ? Feeling of Stress : Not at all  ?Social Connections: Moderately Integrated  ? Frequency of Communication with Friends and Family: More than three times a week  ? Frequency of Social Gatherings with Friends and Family: Once a week  ? Attends Religious Services: More than 4 times per year  ? Active Member of Clubs or Organizations: No  ? Attends Archivist Meetings: Never  ? Marital Status:  Married  ? ? ?Activities of Daily Living ? ?  04/12/2021  ?  8:07 AM 12/08/2020  ?  2:44 PM  ?In your present state of health, do you have any difficulty performing the following activities:  ?Hearing? 0 0  ?Vision? 0 0  ?Difficulty concentrating or making decisions? 0 0  ?Walking or climbing stairs? 0 1  ?Dressing or bathing? 0 0  ?Doing errands, shopping? 0 0  ?Preparing Food and eating ? N   ?Using the Toilet? N   ?In the past six months, have you accidently leaked urine? N   ?Do you have problems with loss of bowel control? N   ?Managing your Medications? N   ?Managing your Finances? N   ?Housekeeping or managing your Housekeeping? N   ? ? ?Patient Education/ Literacy ?How often do you need to have someone help you when you read instructions, pamphlets, or other written materials from your doctor or pharmacy?: 1 - Never ?What is the last grade level you completed in school?: 12th grade ? ?Exercise ?Current Exercise Habits: The patient does not participate in regular exercise at present, Exercise limited by: orthopedic condition(s) ? ?Diet ?Patient reports consuming 3 meals a day and 1 snack(s) a day ?Patient reports that his primary diet is: Regular ?Patient reports that she does have regular access to food.  ? ?Depression Screen ? ?  04/12/2021  ?  8:05 AM 03/09/2021  ?  8:06 AM 12/08/2020  ?  2:45 PM 12/24/2018  ?  8:07 AM 11/14/2018  ?  8:53 AM 01/15/2018  ?  8:24 AM 12/18/2017  ?  8:10 AM  ?PHQ 2/9 Scores  ?PHQ - 2  Score 0 0 0 0 0 0 0  ?PHQ- 9 Score   2 1 0  1  ?  ? ?Fall Risk ? ?  04/12/2021  ?  8:05 AM 03/09/2021  ?  8:06 AM 12/08/2020  ?  2:44 PM 12/24/2018  ?  8:07 AM 12/18/2017  ?  8:10 AM  ?Fall Risk   ?Falls in

## 2021-04-12 NOTE — Patient Instructions (Addendum)
?MEDICARE ANNUAL WELLNESS VISIT ?Health Maintenance Summary and Written Plan of Care ? ?Walter Wilkerson , ? ?Thank you for allowing me to perform your Medicare Annual Wellness Visit and for your ongoing commitment to your health.  ? ?Health Maintenance & Immunization History ?Health Maintenance  ?Topic Date Due  ?? COVID-19 Vaccine (3 - Booster for Janssen series) 04/28/2021 (Originally 03/29/2020)  ?? INFLUENZA VACCINE  08/10/2021  ?? COLONOSCOPY (Pts 45-81yrs Insurance coverage will need to be confirmed)  05/19/2023  ?? TETANUS/TDAP  10/31/2030  ?? Pneumonia Vaccine 61+ Years old  Completed  ?? Hepatitis C Screening  Completed  ?? Zoster Vaccines- Shingrix  Completed  ?? HPV VACCINES  Aged Out  ? ?Immunization History  ?Administered Date(s) Administered  ?? Fluad Quad(high Dose 65+) 12/03/2019, 10/30/2020  ?? Influenza,inj,Quad PF,6+ Mos 01/09/2013, 11/11/2014, 10/16/2015, 11/04/2016, 08/29/2018  ?? Influenza-Unspecified 09/04/2017  ?? Janssen (J&J) SARS-COV-2 Vaccination 03/24/2019, 02/02/2020  ?? Pneumococcal Conjugate-13 04/13/2015  ?? Pneumococcal Polysaccharide-23 11/04/2016  ?? Tdap 09/22/2010, 10/30/2020  ?? Zoster Recombinat (Shingrix) 11/26/2018, 06/11/2019  ? ? ?These are the patient goals that we discussed: ? Goals Addressed   ?  ?  ?  ?  ?  ? This Visit's Progress  ? ?  Patient Stated (pt-stated)     ?   Would like to loose 10 lbs. ?  ?  ?  ? ?This is a list of Health Maintenance Items that are overdue or due now: ?There are no preventive care reminders to display for this patient.  ? ?Orders/Referrals Placed Today: ?No orders of the defined types were placed in this encounter. ? ?(Contact our referral department at [phone] if you have not spoken with someone about your referral appointment within the next 5 days)  ? ? ?Follow-up Plan ?Follow-up with [name], PA-C as planned ?Medicare wellness visit in one year.  ?Patient will access AVS on my chart. ? ? ? ?  ?Health Maintenance,  Male ?Adopting a healthy lifestyle and getting preventive care are important in promoting health and wellness. Ask your health care provider about: ?The right schedule for you to have regular tests and exams. ?Things you can do on your own to prevent diseases and keep yourself healthy. ?What should I know about diet, weight, and exercise? ?Eat a healthy diet ? ?Eat a diet that includes plenty of vegetables, fruits, low-fat dairy products, and lean protein. ?Do not eat a lot of foods that are high in solid fats, added sugars, or sodium. ?Maintain a healthy weight ?Body mass index (BMI) is a measurement that can be used to identify possible weight problems. It estimates body fat based on height and weight. Your health care provider can help determine your BMI and help you achieve or maintain a healthy weight. ?Get regular exercise ?Get regular exercise. This is one of the most important things you can do for your health. Most adults should: ?Exercise for at least 150 minutes each week. The exercise should increase your heart rate and make you sweat (moderate-intensity exercise). ?Do strengthening exercises at least twice a week. This is in addition to the moderate-intensity exercise. ?Spend less time sitting. Even light physical activity can be beneficial. ?Watch cholesterol and blood lipids ?Have your blood tested for lipids and cholesterol at 72 years of age, then have this test every 5 years. ?You may need to have your cholesterol levels checked more often if: ?Your lipid or cholesterol levels are high. ?You are older than 72 years of age. ?You are at  high risk for heart disease. ?What should I know about cancer screening? ?Many types of cancers can be detected early and may often be prevented. Depending on your health history and family history, you may need to have cancer screening at various ages. This may include screening for: ?Colorectal cancer. ?Prostate cancer. ?Skin cancer. ?Lung cancer. ?What should I  know about heart disease, diabetes, and high blood pressure? ?Blood pressure and heart disease ?High blood pressure causes heart disease and increases the risk of stroke. This is more likely to develop in people who have high blood pressure readings or are overweight. ?Talk with your health care provider about your target blood pressure readings. ?Have your blood pressure checked: ?Every 3-5 years if you are 29-65 years of age. ?Every year if you are 43 years old or older. ?If you are between the ages of 45 and 42 and are a current or former smoker, ask your health care provider if you should have a one-time screening for abdominal aortic aneurysm (AAA). ?Diabetes ?Have regular diabetes screenings. This checks your fasting blood sugar level. Have the screening done: ?Once every three years after age 23 if you are at a normal weight and have a low risk for diabetes. ?More often and at a younger age if you are overweight or have a high risk for diabetes. ?What should I know about preventing infection? ?Hepatitis B ?If you have a higher risk for hepatitis B, you should be screened for this virus. Talk with your health care provider to find out if you are at risk for hepatitis B infection. ?Hepatitis C ?Blood testing is recommended for: ?Everyone born from [birth year] through 1966. ?Anyone with known risk factors for hepatitis C. ?Sexually transmitted infections (STIs) ?You should be screened each year for STIs, including gonorrhea and chlamydia, if: ?You are sexually active and are younger than 72 years of age. ?You are older than 72 years of age and your health care provider tells you that you are at risk for this type of infection. ?Your sexual activity has changed since you were last screened, and you are at increased risk for chlamydia or gonorrhea. Ask your health care provider if you are at risk. ?Ask your health care provider about whether you are at high risk for HIV. Your health care provider may recommend a  prescription medicine to help prevent HIV infection. If you choose to take medicine to prevent HIV, you should first get tested for HIV. You should then be tested every 3 months for as long as you are taking the medicine. ?Follow these instructions at home: ?Alcohol use ?Do not drink alcohol if your health care provider tells you not to drink. ?If you drink alcohol: ?Limit how much you have to 0-2 drinks a day. ?Know how much alcohol is in your drink. In the [place] one drink equals one 12 oz bottle of beer (355 mL), one 5 oz glass of wine (148 mL), or one 1? oz glass of hard liquor (44 mL). ?Lifestyle ?Do not use any products that contain nicotine or tobacco. These products include cigarettes, chewing tobacco, and vaping devices, such as e-cigarettes. If you need help quitting, ask your health care provider. ?Do not use street drugs. ?Do not share needles. ?Ask your health care provider for help if you need support or information about quitting drugs. ?General instructions ?Schedule regular health, dental, and eye exams. ?Stay current with your vaccines. ?Tell your health care provider if: ?You often feel depressed. ?You have ever been  abused or do not feel safe at home. ?Summary ?Adopting a healthy lifestyle and getting preventive care are important in promoting health and wellness. ?Follow your health care provider's instructions about healthy diet, exercising, and getting tested or screened for diseases. ?Follow your health care provider's instructions on monitoring your cholesterol and blood pressure. ?This information is not intended to replace advice given to you by your health care provider. Make sure you discuss any questions you have with your health care provider. ?Document Revised: 05/18/2020 Document Reviewed: 05/18/2020 ?Elsevier Patient Education ? [place]. ? ?

## 2021-04-14 ENCOUNTER — Encounter: Payer: Self-pay | Admitting: Physician Assistant

## 2021-04-14 ENCOUNTER — Ambulatory Visit (INDEPENDENT_AMBULATORY_CARE_PROVIDER_SITE_OTHER): Payer: Medicare Other | Admitting: Physician Assistant

## 2021-04-14 VITALS — BP 128/67 | HR 97 | Resp 16 | Ht 68.0 in | Wt 188.0 lb

## 2021-04-14 DIAGNOSIS — D509 Iron deficiency anemia, unspecified: Secondary | ICD-10-CM | POA: Diagnosis not present

## 2021-04-14 DIAGNOSIS — G8929 Other chronic pain: Secondary | ICD-10-CM | POA: Diagnosis not present

## 2021-04-14 DIAGNOSIS — M1711 Unilateral primary osteoarthritis, right knee: Secondary | ICD-10-CM

## 2021-04-14 DIAGNOSIS — M25561 Pain in right knee: Secondary | ICD-10-CM

## 2021-04-14 DIAGNOSIS — R252 Cramp and spasm: Secondary | ICD-10-CM | POA: Diagnosis not present

## 2021-04-14 NOTE — Progress Notes (Signed)
? ?Subjective:  ? ? Patient ID: Walter Wilkerson, male    DOB: 31-Mar-1950, 71 y.o.   MRN: [id number] ? ?HPI ?Pt is a 71 yo male with hx of PUD and NSAID induced gastritis, HTN, Aortic atherosclerosis, neuropathy, OA who presents to the clinic with bilateral intermittent right sided calf and hand cramps. They have been going on for the last 2-3 months and last 2-3 minutes but are severe. Only new medication is diclofenac. He continues to have chronic right knee pain and seeing Dr. Berenice Primas. He has been told does not need knee replacement but would like 2nd opinion.  ? ?.. ?Active Ambulatory Problems  ?  Diagnosis Date Noted  ? Acquired hypothyroidism 08/11/2014  ? Essential hypertension 08/11/2014  ? Hyperlipidemia 08/11/2014  ? Peripheral neuropathy ([place]) 08/11/2014  ? Non-small cell lung cancer ([place]) 08/11/2014  ? Shoulder impingement 02/24/2015  ? Hyperglycemia 04/14/2015  ? Prediabetes 04/15/2015  ? Dermatitis 10/18/2015  ? Cyst of pharynx 01/12/2016  ? Aortic atherosclerosis ([place]) 01/12/2016  ? Elevated serum creatinine 02/10/2016  ? Fatigue 11/04/2016  ? Stress at work 11/04/2016  ? Adhesive capsulitis 12/27/2016  ? Nausea 04/20/2017  ? Epigastric pain 04/20/2017  ? PUD (peptic ulcer disease) 04/20/2017  ? Metatarsal stress fracture of right foot 01/15/2018  ? 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 Chronic pain syndrome 05/29/2018  ? Muscle cramps 09/03/2019  ? Elevated liver enzymes 09/03/2019  ? Lumbar spondylosis 12/03/2019  ? Chronic pain of right knee 12/03/2019  ? Right hand pain 12/03/2019  ? NSAID induced gastritis 12/03/2019  ? Primary osteoarthritis involving multiple joints 12/03/2019  ? Allergic sinusitis 06/03/2020  ? Polyarthritis 02/16/2021  ? Hand cramp 04/14/2021  ? Leg cramps 04/14/2021  ? ?Resolved Ambulatory Problems  ?  Diagnosis Date Noted  ? Acute bronchitis 01/08/2015  ? Injury of right knee 09/03/2019  ? ?Past Medical History:  ?Diagnosis Date  ? Hypertension   ? Lung  cancer ([place])   ? Thyroid disease   ? ? ? ? ?Review of Systems ?See HPI.  ?   ?Objective:  ? Physical Exam ?Vitals reviewed.  ?Constitutional:   ?   Appearance: Normal appearance.  ?HENT:  ?   Head: Normocephalic.  ?Cardiovascular:  ?   Rate and Rhythm: Normal rate and regular rhythm.  ?Pulmonary:  ?   Effort: Pulmonary effort is normal.  ?   Breath sounds: Normal breath sounds.  ?Musculoskeletal:  ?   Right lower leg: No edema.  ?   Left lower leg: No edema.  ?   Comments: Pedal pulse 2 + symmetric.  ?No LEE.  ?No calf swelling, tenderness, redness or warmth.  ?Evidence of arthritic joints with nodules over many of hand joints ?No warmth, redness or tenderness ?Hand grip 5/5.  ?No pain to palpation over hand.   ?Neurological:  ?   General: No focal deficit present.  ?   Mental Status: He is alert and oriented to person, place, and time.  ?Psychiatric:     ?   Mood and Affect: Mood normal.  ? ? ? ? ? ?   ?Assessment & Plan:  ?..Quintyn was seen today for calf pain and hand pain. ? ?Diagnoses and all orders for this visit: ? ?Leg cramps ?-     COMPLETE METABOLIC PANEL WITH GFR ?-     TSH ?-     CBC w/Diff/Platelet ?-     Magnesium ?-     Iron, TIBC and  Ferritin Panel ? ?Hand cramp ?-     COMPLETE METABOLIC PANEL WITH GFR ?-     TSH ?-     CBC w/Diff/Platelet ?-     Magnesium ?-     Iron, TIBC and Ferritin Panel ? ?Chronic pain of right knee ?-     Ambulatory referral to Orthopedic Surgery ? ?Primary osteoarthritis of right knee ?-     Ambulatory referral to Orthopedic Surgery ? ? ?Unclear etiology of cramps ?Will get labs ?Could be the way he is walking due to right knee pain ?2nd opinion referral made today ?Continue diclofenac. ?Start magnesium 400mg  at bedtime, drink plenty of water, stretch and light massage twice a day.  ?Follow up as needed or with any new symptoms.  ? ? ?

## 2021-04-14 NOTE — Patient Instructions (Signed)
Will check labs today ?Start magnesium 400mg  at bedtime ? ?Leg Cramps ?Leg cramps occur when one or more muscles tighten and a person has no control over it (involuntary muscle contraction). Muscle cramps are most common in the calf muscles of the leg. They can occur during exercise or at rest. Leg cramps are painful, and they may last for a few seconds to a few minutes. Cramps may return several times before they finally stop. ?Usually, leg cramps are not caused by a serious medical problem. In many cases, the cause is not known. Some common causes include: ?Excessive physical effort (overexertion), such as during intense exercise. ?Doing the same motion over and over. ?Staying in a certain position for a long period of time. ?Improper preparation, form, or technique while doing a sport or an activity. ?Dehydration. ?Injury. ?Side effects of certain medicines. ?Abnormally low levels of minerals in your blood (electrolytes), especially potassium and calcium. This could result from: ?Pregnancy. ?Taking diuretic medicines. ?Follow these instructions at home: ?Eating and drinking ?Drink enough fluid to keep your urine pale yellow. Staying hydrated may help prevent cramps. ?Eat a healthy diet that includes plenty of nutrients to help your muscles function. A healthy diet includes fruits and vegetables, lean protein, whole grains, and low-fat or nonfat dairy products. ?Managing pain, stiffness, and swelling ?  ?Try massaging, stretching, and relaxing the affected muscle. Do this for several minutes at a time. ?If directed, put ice on areas that are sore or painful after a cramp. To do this: ?Put ice in a plastic bag. ?Place a towel between your skin and the bag. ?Leave the ice on for 20 minutes, 2-3 times a day. ?Remove the ice if your skin turns bright red. This is very important. If you cannot feel pain, heat, or cold, you have a greater risk of damage to the area. ?If directed, apply heat to muscles that are tense or  tight. Do this before you exercise, or as often as told by your health care provider. Use the heat source that your health care provider recommends, such as a moist heat pack or a heating pad. To do this: ?Place a towel between your skin and the heat source. ?Leave the heat on for 20-30 minutes. ?Remove the heat if your skin turns bright red. This is especially important if you are unable to feel pain, heat, or cold. You may have a greater risk of getting burned. ?Try taking hot showers or baths to help relax tight muscles. ?General instructions ?If you are having frequent leg cramps, avoid intense exercise for several days. ?Take over-the-counter and prescription medicines only as told by your health care provider. ?Keep all follow-up visits. This is important. ?Contact a health care provider if: ?Your leg cramps get more severe or more frequent, or they do not improve over time. ?Your foot becomes cold, numb, or blue. ?Summary ?Muscle cramps can develop in any muscle, but the most common place is in the calf muscles of the leg. ?Leg cramps are painful, and they may last for a few seconds to a few minutes. ?Usually, leg cramps are not caused by a serious medical problem. Often, the cause is not known. ?Stay hydrated, and take over-the-counter and prescription medicines only as told by your health care provider. ?This information is not intended to replace advice given to you by your health care provider. Make sure you discuss any questions you have with your health care provider. ?Document Revised: 05/15/2019 Document Reviewed: 05/15/2019 ?Elsevier Patient Education ?  [place]. ? ?

## 2021-04-15 NOTE — Progress Notes (Signed)
Kidney function has improved some but your kidney look dry. How much water are you drinking a day?  ?Sodium, potassium, calcium, magnesium look good. Continue magnesium at bedtime.  ?Thyroid looks good.  ?Your hemoglobin continues to drop. No abnormal bowel movements, sticky stools? I will have JJ add ferritin, serum iron.  ?Start iron supplement 325mg  daily.

## 2021-04-15 NOTE — Progress Notes (Signed)
Recheck CBC in 1-2 weeks.

## 2021-04-17 LAB — CBC WITH DIFFERENTIAL/PLATELET
Absolute Monocytes: 660 cells/uL (ref 200–950)
Basophils Absolute: 61 cells/uL (ref 0–200)
Basophils Relative: 0.9 %
Eosinophils Absolute: 252 cells/uL (ref 15–500)
Eosinophils Relative: 3.7 %
HCT: 34.7 % — ABNORMAL LOW (ref 38.5–50.0)
Hemoglobin: 11.2 g/dL — ABNORMAL LOW (ref 13.2–17.1)
Lymphs Abs: 1231 cells/uL (ref 850–3900)
MCH: 31.7 pg (ref 27.0–33.0)
MCHC: 32.3 g/dL (ref 32.0–36.0)
MCV: 98.3 fL (ref 80.0–100.0)
MPV: 11.8 fL (ref 7.5–12.5)
Monocytes Relative: 9.7 %
Neutro Abs: 4597 cells/uL (ref 1500–7800)
Neutrophils Relative %: 67.6 %
Platelets: 43 10*3/uL — ABNORMAL LOW (ref 140–400)
RBC: 3.53 10*6/uL — ABNORMAL LOW (ref 4.20–5.80)
RDW: 14 % (ref 11.0–15.0)
Total Lymphocyte: 18.1 %
WBC: 6.8 10*3/uL (ref 3.8–10.8)

## 2021-04-17 LAB — IRON,TIBC AND FERRITIN PANEL
%SAT: 30 % (calc) (ref 20–48)
Ferritin: 25 ng/mL (ref 24–380)
Iron: 104 ug/dL (ref 50–180)
TIBC: 343 mcg/dL (calc) (ref 250–425)

## 2021-04-17 LAB — COMPLETE METABOLIC PANEL WITH GFR
AG Ratio: 1.3 (calc) (ref 1.0–2.5)
ALT: 18 U/L (ref 9–46)
AST: 16 U/L (ref 10–35)
Albumin: 3.8 g/dL (ref 3.6–5.1)
Alkaline phosphatase (APISO): 84 U/L (ref 35–144)
BUN/Creatinine Ratio: 21 (calc) (ref 6–22)
BUN: 29 mg/dL — ABNORMAL HIGH (ref 7–25)
CO2: 24 mmol/L (ref 20–32)
Calcium: 9.4 mg/dL (ref 8.6–10.3)
Chloride: 106 mmol/L (ref 98–110)
Creat: 1.41 mg/dL — ABNORMAL HIGH (ref 0.70–1.28)
Globulin: 2.9 g/dL (calc) (ref 1.9–3.7)
Glucose, Bld: 96 mg/dL (ref 65–99)
Potassium: 4.7 mmol/L (ref 3.5–5.3)
Sodium: 139 mmol/L (ref 135–146)
Total Bilirubin: 0.5 mg/dL (ref 0.2–1.2)
Total Protein: 6.7 g/dL (ref 6.1–8.1)
eGFR: 53 mL/min/{1.73_m2} — ABNORMAL LOW (ref >=60)

## 2021-04-17 LAB — TSH: TSH: 2.07 mIU/L (ref 0.40–4.50)

## 2021-04-17 LAB — MAGNESIUM: Magnesium: 2 mg/dL (ref 1.5–2.5)

## 2021-04-18 ENCOUNTER — Other Ambulatory Visit: Payer: Self-pay | Admitting: Physician Assistant

## 2021-04-18 DIAGNOSIS — E039 Hypothyroidism, unspecified: Secondary | ICD-10-CM

## 2021-04-19 ENCOUNTER — Other Ambulatory Visit: Payer: Self-pay | Admitting: Neurology

## 2021-04-19 ENCOUNTER — Encounter: Payer: Self-pay | Admitting: Physician Assistant

## 2021-04-19 DIAGNOSIS — D649 Anemia, unspecified: Secondary | ICD-10-CM

## 2021-04-19 NOTE — Progress Notes (Signed)
Serum iron actually looks pretty good but iron stores are low.

## 2021-04-23 ENCOUNTER — Telehealth: Payer: Self-pay | Admitting: Neurology

## 2021-04-23 NOTE — Telephone Encounter (Signed)
Patient called and left vm stating he has not heard from referral for second opinion on his knee. It looks like referral was sent on 04/19/2021 to [facility]. Called patient and he states this was supposed to be sent elsewhere for a second opinion, this is where he had the first opinion.  ? ?Jenny Reichmann - Please send referral to another practice.  ?

## 2021-04-29 DIAGNOSIS — M1711 Unilateral primary osteoarthritis, right knee: Secondary | ICD-10-CM | POA: Diagnosis not present

## 2021-04-29 DIAGNOSIS — M25561 Pain in right knee: Secondary | ICD-10-CM | POA: Diagnosis not present

## 2021-05-03 ENCOUNTER — Other Ambulatory Visit: Payer: Self-pay | Admitting: Physician Assistant

## 2021-05-03 ENCOUNTER — Ambulatory Visit: Payer: Medicare Other | Admitting: Physician Assistant

## 2021-05-03 DIAGNOSIS — G894 Chronic pain syndrome: Secondary | ICD-10-CM

## 2021-05-06 ENCOUNTER — Other Ambulatory Visit: Payer: Self-pay | Admitting: Medical-Surgical

## 2021-05-06 DIAGNOSIS — R1011 Right upper quadrant pain: Secondary | ICD-10-CM

## 2021-05-07 ENCOUNTER — Other Ambulatory Visit: Payer: Self-pay | Admitting: Physician Assistant

## 2021-05-07 DIAGNOSIS — I10 Essential (primary) hypertension: Secondary | ICD-10-CM

## 2021-05-10 ENCOUNTER — Telehealth: Payer: Self-pay | Admitting: Neurology

## 2021-05-10 DIAGNOSIS — G894 Chronic pain syndrome: Secondary | ICD-10-CM

## 2021-05-10 MED ORDER — DULOXETINE HCL 30 MG PO CPEP: 30.0000 mg | ORAL_CAPSULE | Freq: Every day | ORAL | 1 refills | Status: DC

## 2021-05-10 NOTE — Telephone Encounter (Signed)
We can go back on it but just let him know it was communicated that he was off it.  ? ?Resent to pharmacy.

## 2021-05-10 NOTE — Telephone Encounter (Signed)
Patient called asking why Duloxetine was denied for refill.  ? ?At appt on 02/09/2021: ?For polyarthritis nothing has been working.  ?Stop cymbalta. Go every other day then stop.  ?Increase gabapentin 900mg  TID.  ?Increase celebrex to 200mg  bid.  ?Added prednisone 1mg  daily to see if would help.  ?Get labs in 4-6 weeks.  ? ?At follow up on 03/09/2021 it also states to remain off Duloxetine.  ? ?Patient has been taking and only been off for 4-5 days and having increased arthritis pain in hands. Please advise.  ?

## 2021-05-10 NOTE — Telephone Encounter (Signed)
Patient made aware.

## 2021-06-02 ENCOUNTER — Other Ambulatory Visit: Payer: Self-pay | Admitting: Physician Assistant

## 2021-06-02 DIAGNOSIS — E782 Mixed hyperlipidemia: Secondary | ICD-10-CM

## 2021-06-03 DIAGNOSIS — M1711 Unilateral primary osteoarthritis, right knee: Secondary | ICD-10-CM | POA: Diagnosis not present

## 2021-06-10 DIAGNOSIS — M1711 Unilateral primary osteoarthritis, right knee: Secondary | ICD-10-CM | POA: Diagnosis not present

## 2021-06-17 DIAGNOSIS — M1711 Unilateral primary osteoarthritis, right knee: Secondary | ICD-10-CM | POA: Diagnosis not present

## 2021-07-11 ENCOUNTER — Other Ambulatory Visit: Payer: Self-pay | Admitting: Physician Assistant

## 2021-07-11 DIAGNOSIS — R1011 Right upper quadrant pain: Secondary | ICD-10-CM

## 2021-07-20 DIAGNOSIS — M25561 Pain in right knee: Secondary | ICD-10-CM | POA: Diagnosis not present

## 2021-07-20 DIAGNOSIS — M1711 Unilateral primary osteoarthritis, right knee: Secondary | ICD-10-CM | POA: Diagnosis not present

## 2021-07-22 ENCOUNTER — Other Ambulatory Visit: Payer: Self-pay | Admitting: Physician Assistant

## 2021-07-26 DIAGNOSIS — M25561 Pain in right knee: Secondary | ICD-10-CM | POA: Diagnosis not present

## 2021-08-16 ENCOUNTER — Other Ambulatory Visit: Payer: Self-pay | Admitting: Physician Assistant

## 2021-08-17 DIAGNOSIS — M1712 Unilateral primary osteoarthritis, left knee: Secondary | ICD-10-CM | POA: Diagnosis not present

## 2021-08-17 DIAGNOSIS — M25562 Pain in left knee: Secondary | ICD-10-CM | POA: Diagnosis not present

## 2021-08-20 ENCOUNTER — Telehealth: Payer: Self-pay | Admitting: Neurology

## 2021-08-20 NOTE — Telephone Encounter (Signed)
Received surgical clearance request from [facility]. Patient needs appt with Cincere Deprey. Please call to schedule.

## 2021-08-23 NOTE — Telephone Encounter (Signed)
Patient has been scheduled for a surgical clearance appt on Friday, 08/27/21. AMUCK

## 2021-08-27 ENCOUNTER — Encounter: Payer: Self-pay | Admitting: Physician Assistant

## 2021-08-27 ENCOUNTER — Ambulatory Visit (INDEPENDENT_AMBULATORY_CARE_PROVIDER_SITE_OTHER): Payer: Medicare Other | Admitting: Physician Assistant

## 2021-08-27 VITALS — BP 140/66 | HR 98 | Ht 68.0 in | Wt 189.0 lb

## 2021-08-27 DIAGNOSIS — Z01818 Encounter for other preprocedural examination: Secondary | ICD-10-CM

## 2021-08-27 DIAGNOSIS — I10 Essential (primary) hypertension: Secondary | ICD-10-CM | POA: Diagnosis not present

## 2021-08-27 DIAGNOSIS — K219 Gastro-esophageal reflux disease without esophagitis: Secondary | ICD-10-CM | POA: Insufficient documentation

## 2021-08-27 DIAGNOSIS — R9431 Abnormal electrocardiogram [ECG] [EKG]: Secondary | ICD-10-CM | POA: Diagnosis not present

## 2021-08-27 DIAGNOSIS — M130 Polyarthritis, unspecified: Secondary | ICD-10-CM

## 2021-08-27 MED ORDER — AMLODIPINE BESYLATE 10 MG PO TABS
10.0000 mg | ORAL_TABLET | Freq: Every day | ORAL | 1 refills | Status: DC
Start: 2021-08-27 — End: 2021-09-06

## 2021-08-27 MED ORDER — PREDNISONE 1 MG PO TABS: 1.0000 mg | ORAL_TABLET | Freq: Every day | ORAL | 1 refills | Status: DC

## 2021-08-27 MED ORDER — FAMOTIDINE 20 MG PO TABS
20.0000 mg | ORAL_TABLET | Freq: Two times a day (BID) | ORAL | 3 refills | Status: DC
Start: 2021-08-27 — End: 2022-09-09

## 2021-08-27 MED ORDER — HYDRALAZINE HCL 25 MG PO TABS
25.0000 mg | ORAL_TABLET | Freq: Two times a day (BID) | ORAL | 1 refills | Status: DC
Start: 2021-08-27 — End: 2022-03-28

## 2021-08-27 NOTE — Progress Notes (Signed)
Established Patient Office Visit  Subjective   Patient ID: Walter Wilkerson, male    DOB: 03/17/1950  Age: 71 y.o. MRN: [id number]  Chief Complaint  Patient presents with   Pre-op Exam    HPI Pt is a 71 yo male with HTN, Aortic atherosclerosis, GERD and OA of multiple joints.   He is scheduled for surgery on his right knee for total knee replacement in early September and needs surgical clearance.   Pt denies any SOB, cough, fever, CP, palpitations, headaches or vision changes. He takes his medications as directed.   .. Active Ambulatory Problems    Diagnosis Date Noted   Acquired hypothyroidism 08/11/2014   Essential hypertension 08/11/2014   Hyperlipidemia 08/11/2014   Peripheral neuropathy ([place]) 08/11/2014   Non-small cell lung cancer ([place]) 08/11/2014   Shoulder impingement 02/24/2015   Hyperglycemia 04/14/2015   Prediabetes 04/15/2015   Dermatitis 10/18/2015   Cyst of pharynx 01/12/2016   Aortic atherosclerosis ([place]) 01/12/2016   Elevated serum creatinine 02/10/2016   Fatigue 11/04/2016   Stress at work 11/04/2016   Adhesive capsulitis 12/27/2016   Nausea 04/20/2017   Epigastric pain 04/20/2017   PUD (peptic ulcer disease) 04/20/2017   Metatarsal stress fracture of right foot 01/15/2018   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 03/02/2018   Chronic pain syndrome 05/29/2018   Muscle cramps 09/03/2019   Elevated liver enzymes 09/03/2019   Lumbar spondylosis 12/03/2019   Chronic pain of right knee 12/03/2019   Right hand pain 12/03/2019   NSAID induced gastritis 12/03/2019   Primary osteoarthritis involving multiple joints 12/03/2019   Allergic sinusitis 06/03/2020   Polyarthritis 02/16/2021   Hand cramp 04/14/2021   Leg cramps 04/14/2021   EKG abnormalities 08/27/2021   Gastroesophageal reflux disease 08/27/2021   Resolved Ambulatory Problems    Diagnosis Date Noted   Acute bronchitis 01/08/2015   Injury of right knee 09/03/2019   Past Medical  History:  Diagnosis Date   Hypertension    Lung cancer ([place])    Thyroid disease       Review of Systems  All other systems reviewed and are negative.     Objective:     BP (!) 140/66   Pulse 98   Ht 5\' 8"  (1.727 m)   Wt 189 lb (85.7 kg)   SpO2 96%   BMI 28.74 kg/m  BP Readings from Last 3 Encounters:  08/27/21 (!) 140/66  04/14/21 128/67  03/19/21 (!) 154/81   Wt Readings from Last 3 Encounters:  08/27/21 189 lb (85.7 kg)  04/14/21 188 lb (85.3 kg)  03/19/21 186 lb (84.4 kg)      Physical Exam Constitutional:      Appearance: Normal appearance.  HENT:     Head: Normocephalic.  Neck:     Vascular: No carotid bruit.  Cardiovascular:     Rate and Rhythm: Normal rate and regular rhythm.     Pulses: Normal pulses.     Heart sounds: Normal heart sounds.  Pulmonary:     Effort: Pulmonary effort is normal.     Breath sounds: Normal breath sounds.  Musculoskeletal:     Right lower leg: No edema.     Left lower leg: No edema.  Neurological:     General: No focal deficit present.     Mental Status: He is alert and oriented to person, place, and time.  Psychiatric:        Mood and Affect: Mood normal.     EKG-  NSR with some septal ST changes with no EKG to compare it to.     Assessment & Plan:  [name] KitchenHerschell was seen today for pre-op exam.  Diagnoses and all orders for this visit:  Preop testing -     EKG 12-Lead -     CBC w/Diff/Platelet -     BASIC METABOLIC PANEL WITH GFR  EKG abnormalities  Gastroesophageal reflux disease, unspecified whether esophagitis present -     famotidine (PEPCID) 20 MG tablet; Take 1 tablet (20 mg total) by mouth 2 (two) times daily.  Polyarthritis -     predniSONE (DELTASONE) 1 MG tablet; Take 1 tablet (1 mg total) by mouth daily with breakfast.  Essential hypertension -     amLODipine (NORVASC) 10 MG tablet; Take 1 tablet (10 mg total) by mouth daily. -     hydrALAZINE (APRESOLINE) 25 MG tablet; Take 1 tablet (25 mg  total) by mouth 2 (two) times daily.   Pt will get labs at pre-op hospital visit EKG changes with no EKG to compare it to No CV event history but he does have known aortic atherosclerosis No Cardiac symptoms Would like to get stress test before knee surgery Pt cannot exercise with his knee and will need to do nuclear stress test BP not quite to goal-refilled medications Recommended flu shot before surgery we do not have in office may get at [place], PA-C

## 2021-08-31 DIAGNOSIS — M25561 Pain in right knee: Secondary | ICD-10-CM | POA: Diagnosis not present

## 2021-08-31 DIAGNOSIS — M1711 Unilateral primary osteoarthritis, right knee: Secondary | ICD-10-CM | POA: Diagnosis not present

## 2021-09-03 ENCOUNTER — Telehealth: Payer: Self-pay

## 2021-09-03 NOTE — Telephone Encounter (Signed)
Pt called in regards to needing a stress test for his pre-op testing , pt was advised  to reach out to [address] and call to see if the can place him on the schedule for testing , pt acknowledged and agreed

## 2021-09-06 ENCOUNTER — Telehealth: Payer: Self-pay | Admitting: Physician Assistant

## 2021-09-06 ENCOUNTER — Ambulatory Visit: Payer: Medicare Other | Admitting: Physician Assistant

## 2021-09-06 DIAGNOSIS — M25661 Stiffness of right knee, not elsewhere classified: Secondary | ICD-10-CM | POA: Diagnosis not present

## 2021-09-06 DIAGNOSIS — I10 Essential (primary) hypertension: Secondary | ICD-10-CM

## 2021-09-06 DIAGNOSIS — M1731 Unilateral post-traumatic osteoarthritis, right knee: Secondary | ICD-10-CM | POA: Diagnosis not present

## 2021-09-06 DIAGNOSIS — R262 Difficulty in walking, not elsewhere classified: Secondary | ICD-10-CM | POA: Diagnosis not present

## 2021-09-06 MED ORDER — VALSARTAN-HYDROCHLOROTHIAZIDE 320-25 MG PO TABS: 1.0000 | ORAL_TABLET | Freq: Every day | ORAL | 1 refills | Status: DC

## 2021-09-06 NOTE — Addendum Note (Signed)
Addended byAnnamaria Helling on: 09/06/2021 11:52 AM   Modules accepted: Orders

## 2021-09-06 NOTE — Telephone Encounter (Signed)
Patient stated the wrong BP medication was called in. He is on valsartan-hydrochlorothiazide but amlodipine which he has not been on since 10/22 was called in.  Please send his Valsartan in for him.

## 2021-09-06 NOTE — Telephone Encounter (Signed)
RX sent to pharmacy as requested. Removed Amlodipine.

## 2021-09-07 ENCOUNTER — Telehealth (HOSPITAL_COMMUNITY): Payer: Self-pay | Admitting: *Deleted

## 2021-09-07 NOTE — Telephone Encounter (Signed)
Patient given detailed instructions per Myocardial Perfusion Study Information Sheet for the test on 09/08/21 Patient notified to arrive 15 minutes early and that it is imperative to arrive on time for appointment to keep from having the test rescheduled.  If you need to cancel or reschedule your appointment, please call the office within 24 hours of your appointment. . Patient verbalized understanding. Walter Wilkerson

## 2021-09-08 ENCOUNTER — Ambulatory Visit (HOSPITAL_COMMUNITY): Payer: Medicare Other | Attending: Cardiology

## 2021-09-08 DIAGNOSIS — Z01818 Encounter for other preprocedural examination: Secondary | ICD-10-CM | POA: Diagnosis not present

## 2021-09-08 DIAGNOSIS — R9431 Abnormal electrocardiogram [ECG] [EKG]: Secondary | ICD-10-CM

## 2021-09-08 LAB — MYOCARDIAL PERFUSION IMAGING
Base ST Depression (mm): 0 mm
LV dias vol: 88 mL (ref 62–150)
LV sys vol: 27 mL
Nuc Stress EF: 69 %
Peak HR: 83 {beats}/min
Rest HR: 66 {beats}/min
Rest Nuclear Isotope Dose: 10.7 mCi
SDS: 1
SRS: 0
SSS: 1
ST Depression (mm): 0 mm
Stress Nuclear Isotope Dose: 31.9 mCi
TID: 1

## 2021-09-08 MED ORDER — REGADENOSON 0.4 MG/5ML IV SOLN
0.4000 mg | Freq: Once | INTRAVENOUS | Status: AC
  Administered 2021-09-08: 0.4 mg via INTRAVENOUS

## 2021-09-08 MED ORDER — TECHNETIUM TC 99M TETROFOSMIN IV KIT
10.7000 | PACK | Freq: Once | INTRAVENOUS | Status: AC | PRN
  Administered 2021-09-08: 10.7 via INTRAVENOUS

## 2021-09-08 MED ORDER — TECHNETIUM TC 99M TETROFOSMIN IV KIT
31.9000 | PACK | Freq: Once | INTRAVENOUS | Status: AC | PRN
  Administered 2021-09-08: 31.9 via INTRAVENOUS

## 2021-09-08 NOTE — Progress Notes (Signed)
Normal stress test! Great news. I just got labs with your platelet count being very low. Stop diclofenac and ASA and recheck on Friday.

## 2021-09-08 NOTE — Telephone Encounter (Signed)
Patient has been on the Diovan/HCTZ, in the last note you sent Amlodipine. Did you mean to add this medication?

## 2021-09-09 NOTE — Telephone Encounter (Signed)
Okay, medication has been removed and cancelled.

## 2021-09-10 ENCOUNTER — Telehealth: Payer: Self-pay

## 2021-09-10 DIAGNOSIS — Z01818 Encounter for other preprocedural examination: Secondary | ICD-10-CM | POA: Diagnosis not present

## 2021-09-10 DIAGNOSIS — R9431 Abnormal electrocardiogram [ECG] [EKG]: Secondary | ICD-10-CM

## 2021-09-10 DIAGNOSIS — D696 Thrombocytopenia, unspecified: Secondary | ICD-10-CM

## 2021-09-10 NOTE — Telephone Encounter (Signed)
We are waiting on repeat platelets that were drawn today.

## 2021-09-10 NOTE — Telephone Encounter (Signed)
Bethena Roys from [facility] called wanting to know if we can take main priority of this patient first thing Tuesday morning as soon as we are back in the office. They also need medical clearance and office notes sent to them with the lab results.  They don't want to cancel the procedure because it is a 6 weeks process.

## 2021-09-10 NOTE — Telephone Encounter (Signed)
Will put as top priority on Tuesday.

## 2021-09-10 NOTE — Telephone Encounter (Signed)
Surgical Coordinator Bethena Roys from Orthopedics left a vm msg. She stated that they have not received a completed pre-op clearance form and most recent vis for patient. Per Bethena Roys, the patient is scheduled to have his procedure on Wednesday. The procedure will be cancelled if the above information is not received by the end of business today. Their office will be closed on Monday. Documents can be faxed to [phone].

## 2021-09-11 LAB — CBC WITH DIFFERENTIAL/PLATELET
Absolute Monocytes: 701 cells/uL (ref 200–950)
Basophils Absolute: 58 cells/uL (ref 0–200)
Basophils Relative: 0.8 %
Eosinophils Absolute: 183 cells/uL (ref 15–500)
Eosinophils Relative: 2.5 %
HCT: 35.9 % — ABNORMAL LOW (ref 38.5–50.0)
Hemoglobin: 11.9 g/dL — ABNORMAL LOW (ref 13.2–17.1)
Lymphs Abs: 1628 cells/uL (ref 850–3900)
MCH: 31.3 pg (ref 27.0–33.0)
MCHC: 33.1 g/dL (ref 32.0–36.0)
MCV: 94.5 fL (ref 80.0–100.0)
MPV: 12.8 fL — ABNORMAL HIGH (ref 7.5–12.5)
Monocytes Relative: 9.6 %
Neutro Abs: 4730 cells/uL (ref 1500–7800)
Neutrophils Relative %: 64.8 %
Platelets: 45 10*3/uL — ABNORMAL LOW (ref 140–400)
RBC: 3.8 10*6/uL — ABNORMAL LOW (ref 4.20–5.80)
RDW: 13.6 % (ref 11.0–15.0)
Total Lymphocyte: 22.3 %
WBC: 7.3 10*3/uL (ref 3.8–10.8)

## 2021-09-11 LAB — BASIC METABOLIC PANEL WITH GFR
BUN: 24 mg/dL (ref 7–25)
CO2: 26 mmol/L (ref 20–32)
Calcium: 9.4 mg/dL (ref 8.6–10.3)
Chloride: 105 mmol/L (ref 98–110)
Creat: 1.16 mg/dL (ref 0.70–1.28)
Glucose, Bld: 124 mg/dL — ABNORMAL HIGH (ref 65–99)
Potassium: 4.2 mmol/L (ref 3.5–5.3)
Sodium: 138 mmol/L (ref 135–146)
eGFR: 67 mL/min/{1.73_m2} (ref >=60)

## 2021-09-14 ENCOUNTER — Telehealth: Payer: Self-pay

## 2021-09-14 ENCOUNTER — Encounter: Payer: Self-pay | Admitting: Family

## 2021-09-14 ENCOUNTER — Inpatient Hospital Stay: Payer: Medicare Other | Attending: Hematology & Oncology

## 2021-09-14 ENCOUNTER — Other Ambulatory Visit: Payer: Self-pay | Admitting: Family

## 2021-09-14 ENCOUNTER — Inpatient Hospital Stay: Payer: Medicare Other | Admitting: Family

## 2021-09-14 ENCOUNTER — Other Ambulatory Visit: Payer: Self-pay | Admitting: Physician Assistant

## 2021-09-14 VITALS — BP 144/84 | HR 85 | Temp 98.3°F | Resp 17 | Ht 68.5 in | Wt 185.0 lb

## 2021-09-14 DIAGNOSIS — D696 Thrombocytopenia, unspecified: Secondary | ICD-10-CM | POA: Insufficient documentation

## 2021-09-14 DIAGNOSIS — E039 Hypothyroidism, unspecified: Secondary | ICD-10-CM | POA: Diagnosis not present

## 2021-09-14 DIAGNOSIS — Z87891 Personal history of nicotine dependence: Secondary | ICD-10-CM | POA: Insufficient documentation

## 2021-09-14 DIAGNOSIS — D649 Anemia, unspecified: Secondary | ICD-10-CM

## 2021-09-14 LAB — CBC WITH DIFFERENTIAL (CANCER CENTER ONLY)
Abs Immature Granulocytes: 0.02 10*3/uL (ref 0.00–0.07)
Basophils Absolute: 0.1 10*3/uL (ref 0.0–0.1)
Basophils Relative: 1 %
Eosinophils Absolute: 0.2 10*3/uL (ref 0.0–0.5)
Eosinophils Relative: 2 %
HCT: 34.9 % — ABNORMAL LOW (ref 39.0–52.0)
Hemoglobin: 11.9 g/dL — ABNORMAL LOW (ref 13.0–17.0)
Immature Granulocytes: 0 %
Lymphocytes Relative: 18 %
Lymphs Abs: 1.4 10*3/uL (ref 0.7–4.0)
MCH: 31.3 pg (ref 26.0–34.0)
MCHC: 34.1 g/dL (ref 30.0–36.0)
MCV: 91.8 fL (ref 80.0–100.0)
Monocytes Absolute: 0.9 10*3/uL (ref 0.1–1.0)
Monocytes Relative: 11 %
Neutro Abs: 5.5 10*3/uL (ref 1.7–7.7)
Neutrophils Relative %: 68 %
Platelet Count: 228 10*3/uL (ref 150–400)
RBC: 3.8 MIL/uL — ABNORMAL LOW (ref 4.22–5.81)
RDW: 15.5 % (ref 11.5–15.5)
WBC Count: 8 10*3/uL (ref 4.0–10.5)
nRBC: 0 % (ref 0.0–0.2)

## 2021-09-14 LAB — CMP (CANCER CENTER ONLY)
ALT: 15 U/L (ref 0–44)
AST: 17 U/L (ref 15–41)
Albumin: 4.1 g/dL (ref 3.5–5.0)
Alkaline Phosphatase: 78 U/L (ref 38–126)
Anion gap: 8 (ref 5–15)
BUN: 17 mg/dL (ref 8–23)
CO2: 26 mmol/L (ref 22–32)
Calcium: 9.9 mg/dL (ref 8.9–10.3)
Chloride: 102 mmol/L (ref 98–111)
Creatinine: 1.3 mg/dL — ABNORMAL HIGH (ref 0.61–1.24)
GFR, Estimated: 59 mL/min — ABNORMAL LOW (ref >60)
Glucose, Bld: 106 mg/dL — ABNORMAL HIGH (ref 70–99)
Potassium: 4 mmol/L (ref 3.5–5.1)
Sodium: 136 mmol/L (ref 135–145)
Total Bilirubin: 0.6 mg/dL (ref 0.3–1.2)
Total Protein: 7.4 g/dL (ref 6.5–8.1)

## 2021-09-14 LAB — SAVE SMEAR(SSMR), FOR PROVIDER SLIDE REVIEW

## 2021-09-14 LAB — IRON AND IRON BINDING CAPACITY (CC-WL,HP ONLY)
Iron: 93 ug/dL (ref 45–182)
Saturation Ratios: 25 % (ref 17.9–39.5)
TIBC: 367 ug/dL (ref 250–450)
UIBC: 274 ug/dL (ref 117–376)

## 2021-09-14 LAB — FERRITIN: Ferritin: 18 ng/mL — ABNORMAL LOW (ref 24–336)

## 2021-09-14 LAB — LACTATE DEHYDROGENASE: LDH: 148 U/L (ref 98–192)

## 2021-09-14 NOTE — Telephone Encounter (Signed)
Information that we have available has been faxed to number provided. Hematology note/labs not completed yet.

## 2021-09-14 NOTE — Telephone Encounter (Signed)
Walter Wilkerson with ortho and updated her on need for Hematology clearance.   Tried to call patient to update him and let him know, no answer. LVM for him to call back.

## 2021-09-14 NOTE — Progress Notes (Signed)
Referral placed for hematology for low platelets to have checked before surgery.

## 2021-09-14 NOTE — Telephone Encounter (Signed)
Not for surgery clearance.

## 2021-09-14 NOTE — Progress Notes (Signed)
Hematology/Oncology Consultation   Name: Walter Wilkerson      MRN: [id number]    Location: Room/bed info not found  Date: 09/14/2021 Time:1:50 PM   REFERRING PHYSICIAN:  Iran Planas, PA-C  REASON FOR CONSULT: Thrombocytopenia    DIAGNOSIS: Mild anemia and thrombocytopenia   HISTORY OF PRESENT ILLNESS: Walter Wilkerson is a very pleasant 71 yo caucasian gentleman with noted thrombocytopenia and mild anemia over the last year or so.  He remains asymptomatic.  He denies any blood loss, bruising or petechiae.  He is scheduled for total right knee replacement tomorrow with Dr. Dorna Leitz.  He has pain in the right knee with mild swelling at times.  He has had 2 falls from the right knee giving out. Thankfully he states that he was not seriously injured. No syncope reported.  He is currently holding his aspirin, voltaren and B 12 prior to surgery.  Today his platelets are within normal limits at 228, Hgb 11.9, MCV 91 and WBC count 8.0.  No known familial history of thrombocytopenia or other abnormalities with the blood.   He states that while having thrombocytopenia he has had an arthroscopy of the right knee and injections without any bruising or bleeding.  He has some fatigue at times as well as occasional palpitations.  Past history of right thumb surgery and right ankle reconstruction without any complications.  Patient has remote history of non-small cell lung cancer diagnosed in 2008 and treated with right lower and middle lobectomy in 03/2006. He completed adjuvant chemo with Dr. Leonette Monarch at [facility]. So far, there has been no evidence of recurrence.  Maternal aunt had history of cancer, patient thinks primary was melanoma.  He is oh synthroid for hypothyroidism.  No history of diabetes.  No fever, chills, n/v, cough, rash, dizziness, chest pain,  abdominal pain or changes in bowel or bladder habits.  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  Prior to his right knee issues he was active going to the gym several days a week.   ROS: All other 10 point review of systems is negative.   PAST MEDICAL HISTORY:   Past Medical History:  Diagnosis Date   Hypertension    Lung cancer ([place])    Thyroid disease     ALLERGIES: Allergies  Allergen Reactions   Merbromin Hives    Hives   Thimerosal Hives   Lipitor [Atorvastatin Calcium]     Myalgias.    Lisinopril Other (See Comments)   Pregabalin Other (See Comments)    Elevated LFTs      MEDICATIONS:  Current Outpatient Medications on File Prior to Visit  Medication Sig Dispense Refill   albuterol (VENTOLIN HFA) 108 (90 Base) MCG/ACT inhaler Inhale 2 puffs into the lungs every 4 (four) hours as needed. 6.7 g 0   aspirin EC 81 MG tablet Take 81 mg by mouth daily.     atorvastatin (LIPITOR) 40 MG tablet Take 1 tablet (40 mg total) by mouth daily. Needs lab work for refills 90 tablet 2   diclofenac (VOLTAREN) 75 MG EC tablet Take 1 tablet by mouth twice a day 180 tablet 1   DULoxetine (CYMBALTA) 30 MG capsule Take 1 capsule (30 mg total) by mouth daily. 90 capsule 1   famotidine (PEPCID) 20 MG tablet Take 1 tablet (20 mg total) by mouth 2 (two) times daily. 180 tablet 3   hydrALAZINE (APRESOLINE) 25  MG tablet Take 1 tablet (25 mg total) by mouth 2 (two) times daily. 180 tablet 1   levothyroxine (SYNTHROID) 150 MCG tablet Take 1 tablet (150 mcg total) by mouth daily before breakfast. 90 tablet 3   Multiple Vitamin (MULTIVITAMIN) tablet Take 1 tablet by mouth daily.     omeprazole (PRILOSEC) 40 MG capsule Take 1 capsule (40 mg total) by mouth daily. 90 capsule 2   predniSONE (DELTASONE) 1 MG tablet Take 1 tablet (1 mg total) by mouth daily with breakfast. 90 tablet 1   valsartan-hydrochlorothiazide (DIOVAN-HCT) 320-25 MG tablet Take 1 tablet by mouth daily. 90 tablet 1   No current facility-administered medications on file prior to visit.      PAST SURGICAL HISTORY Past Surgical History:  Procedure Laterality Date   LUNG LOBECTOMY Right 2008   RLL resection   VASECTOMY      FAMILY HISTORY: Family History  Problem Relation Age of Onset   Heart attack Father    Hyperlipidemia Father    Hypertension Father    Heart disease Father    Diabetes Other        grandmother     SOCIAL HISTORY:  reports that he quit smoking about 17 years ago. His smoking use included cigarettes. He has never used smokeless tobacco. He reports that he does not currently use alcohol after a past usage of about 21.0 standard drinks of alcohol per week. He reports that he does not use drugs.  PERFORMANCE STATUS: The patient's performance status is 0 - Asymptomatic  PHYSICAL EXAM: Most Recent Vital Signs: Blood pressure (!) 144/84, pulse 85, temperature 98.3 F (36.8 C), temperature source Oral, resp. rate 17, height 5' 8.5" (1.74 m), weight 185 lb (83.9 kg), SpO2 98 %. BP (!) 144/84 (BP Location: Left Arm, Patient Position: Sitting)   Pulse 85   Temp 98.3 F (36.8 C) (Oral)   Resp 17   Ht 5' 8.5" (1.74 m)   Wt 185 lb (83.9 kg)   SpO2 98%   BMI 27.72 kg/m   General Appearance:    Alert, cooperative, no distress, appears stated age  Head:    Normocephalic, without obvious abnormality, atraumatic  Eyes:    PERRL, conjunctiva/corneas clear, EOM's intact, fundi    benign, both eyes             Throat:   Lips, mucosa, and tongue normal; teeth and gums normal  Neck:   Supple, symmetrical, trachea midline, no adenopathy;       thyroid:  No enlargement/tenderness/nodules; no carotid   bruit or JVD  Back:     Symmetric, no curvature, ROM normal, no CVA tenderness  Lungs:     Clear to auscultation bilaterally, respirations unlabored  Chest wall:    No tenderness or deformity  Heart:    Regular rate and rhythm, S1 and S2 normal, no murmur, rub   or gallop  Abdomen:     Soft, non-tender, bowel sounds active all four quadrants,    no masses, no  organomegaly        Extremities:   Extremities normal, atraumatic, no cyanosis or edema  Pulses:   2+ and symmetric all extremities  Skin:   Skin color, texture, turgor normal, no rashes or lesions  Lymph nodes:   Cervical, supraclavicular, and axillary nodes normal  Neurologic:   CNII-XII intact. Normal strength, sensation and reflexes      throughout    LABORATORY DATA:  Results for orders placed or performed in  visit on 09/14/21 (from the past 48 hour(s))  CBC with Differential ([place] Only)     Status: Abnormal   Collection Time: 09/14/21  1:25 PM  Result Value Ref Range   WBC Count 8.0 4.0 - 10.5 K/uL   RBC 3.80 (L) 4.22 - 5.81 MIL/uL   Hemoglobin 11.9 (L) 13.0 - 17.0 g/dL   HCT 34.9 (L) 39.0 - 52.0 %   MCV 91.8 80.0 - 100.0 fL   MCH 31.3 26.0 - 34.0 pg   MCHC 34.1 30.0 - 36.0 g/dL   RDW 15.5 11.5 - 15.5 %   Platelet Count 228 150 - 400 K/uL   nRBC 0.0 0.0 - 0.2 %   Neutrophils Relative % 68 %   Neutro Abs 5.5 1.7 - 7.7 K/uL   Lymphocytes Relative 18 %   Lymphs Abs 1.4 0.7 - 4.0 K/uL   Monocytes Relative 11 %   Monocytes Absolute 0.9 0.1 - 1.0 K/uL   Eosinophils Relative 2 %   Eosinophils Absolute 0.2 0.0 - 0.5 K/uL   Basophils Relative 1 %   Basophils Absolute 0.1 0.0 - 0.1 K/uL   Immature Granulocytes 0 %   Abs Immature Granulocytes 0.02 0.00 - 0.07 K/uL    Comment: Performed at [facility] at [facility], [address] [place], [place] [address]  Save Smear for Provider Slide Review     Status: None   Collection Time: 09/14/21  1:25 PM  Result Value Ref Range   Smear Review SMEAR STAINED AND AVAILABLE FOR REVIEW     Comment: Performed at [facility] at [facility], [address], [place], [place] [address]      RADIOGRAPHY: No results found.     PATHOLOGY: None  ASSESSMENT/PLAN: Walter Wilkerson is a very pleasant 71 yo caucasian gentleman with noted thrombocytopenia and mild anemia over the last  year or so.  CBC,CMP and blood smear reviewed with Dr. Marin Olp. Blood smear unremarkable. No abnormality or evidence of malignancy noted. Platelets appear adequate in number and well developed.  From our standpoint the patient is cleared to proceed with total right knee replacement surgery tomorrow.  No follow-up needed.   All questions were answered. The patient knows to call the clinic with any problems, questions or concerns. We can certainly see him again for any future heme/onc issues that may arise.   The patient was discussed with Dr. Marin Olp and he is in agreement with the aforementioned.   Lottie Dawson, NP

## 2021-09-14 NOTE — Telephone Encounter (Signed)
Per provider's request, contacted Dr. Berenice Primas office. Patient has been cleared for his surgical procedure on 09/16/23.   Dr. Berenice Primas assistant Bethena Roys at [phone], request that patient's clearance form, most recent notes and documentation from the provider regarding the clearance from Hematologist specialist be included as well.   Please fax all the necessary information asap before the end of the business day to:   [phone] (primary fax) and/or [phone] (secondary fax).

## 2021-09-14 NOTE — Telephone Encounter (Signed)
Patient called back and he was made aware. He heard from Hematology and will be at appt at 1:30 pm.

## 2021-09-14 NOTE — Telephone Encounter (Signed)
Information faxed and patient was made aware of clearance.

## 2021-09-14 NOTE — Telephone Encounter (Signed)
Please advise on labs drawn last week for surgery.

## 2021-09-15 ENCOUNTER — Telehealth: Payer: Self-pay

## 2021-09-15 DIAGNOSIS — E782 Mixed hyperlipidemia: Secondary | ICD-10-CM

## 2021-09-15 DIAGNOSIS — G8918 Other acute postprocedural pain: Secondary | ICD-10-CM | POA: Diagnosis not present

## 2021-09-15 DIAGNOSIS — M1712 Unilateral primary osteoarthritis, left knee: Secondary | ICD-10-CM | POA: Diagnosis not present

## 2021-09-15 DIAGNOSIS — M1711 Unilateral primary osteoarthritis, right knee: Secondary | ICD-10-CM | POA: Diagnosis not present

## 2021-09-15 DIAGNOSIS — Z96651 Presence of right artificial knee joint: Secondary | ICD-10-CM | POA: Diagnosis not present

## 2021-09-15 DIAGNOSIS — Z96652 Presence of left artificial knee joint: Secondary | ICD-10-CM | POA: Diagnosis not present

## 2021-09-15 HISTORY — PX: REPLACEMENT TOTAL KNEE: SUR1224

## 2021-09-15 LAB — ERYTHROPOIETIN: Erythropoietin: 10 m[IU]/mL (ref 2.6–18.5)

## 2021-09-15 NOTE — Telephone Encounter (Signed)
At time of process, I was informed that the test did not not required an auth or pre-certification for the procedure. However, the insurance stated that if test was not medically necessary, patient would be responsible for any outstanding cost.

## 2021-09-15 NOTE — Telephone Encounter (Signed)
Patient stopped by the clinic with paperwork from insurance. Per patient, insurance has denied coverage for Myocardial Perfusion Imaging test. Per insurance, a fast appeal can be authorized by provider regarding reason for study. Provider can call [phone], member id# [id number].   Denial letter placed in provider's bin for review.

## 2021-09-20 DIAGNOSIS — M25561 Pain in right knee: Secondary | ICD-10-CM | POA: Diagnosis not present

## 2021-09-20 DIAGNOSIS — R262 Difficulty in walking, not elsewhere classified: Secondary | ICD-10-CM | POA: Diagnosis not present

## 2021-09-20 DIAGNOSIS — Z96651 Presence of right artificial knee joint: Secondary | ICD-10-CM | POA: Diagnosis not present

## 2021-09-23 DIAGNOSIS — M25561 Pain in right knee: Secondary | ICD-10-CM | POA: Diagnosis not present

## 2021-09-23 DIAGNOSIS — M25461 Effusion, right knee: Secondary | ICD-10-CM | POA: Diagnosis not present

## 2021-09-23 DIAGNOSIS — Z96651 Presence of right artificial knee joint: Secondary | ICD-10-CM | POA: Diagnosis not present

## 2021-09-23 DIAGNOSIS — R262 Difficulty in walking, not elsewhere classified: Secondary | ICD-10-CM | POA: Diagnosis not present

## 2021-09-24 MED ORDER — ATORVASTATIN CALCIUM 40 MG PO TABS: 40.0000 mg | ORAL_TABLET | Freq: Every day | ORAL | 3 refills | Status: DC

## 2021-09-27 NOTE — Telephone Encounter (Signed)
Appeal letter and EKG faxed for standard appeal to [facility] at [phone] with confirmation received.

## 2021-09-28 DIAGNOSIS — M25561 Pain in right knee: Secondary | ICD-10-CM | POA: Diagnosis not present

## 2021-09-28 DIAGNOSIS — M1711 Unilateral primary osteoarthritis, right knee: Secondary | ICD-10-CM | POA: Diagnosis not present

## 2021-09-28 DIAGNOSIS — M25061 Hemarthrosis, right knee: Secondary | ICD-10-CM | POA: Diagnosis not present

## 2021-09-28 DIAGNOSIS — M25461 Effusion, right knee: Secondary | ICD-10-CM | POA: Diagnosis not present

## 2021-09-28 DIAGNOSIS — Z9889 Other specified postprocedural states: Secondary | ICD-10-CM | POA: Diagnosis not present

## 2021-10-12 DIAGNOSIS — M25561 Pain in right knee: Secondary | ICD-10-CM | POA: Diagnosis not present

## 2021-10-12 DIAGNOSIS — M25461 Effusion, right knee: Secondary | ICD-10-CM | POA: Diagnosis not present

## 2021-10-14 DIAGNOSIS — R262 Difficulty in walking, not elsewhere classified: Secondary | ICD-10-CM | POA: Diagnosis not present

## 2021-10-14 DIAGNOSIS — Z96651 Presence of right artificial knee joint: Secondary | ICD-10-CM | POA: Diagnosis not present

## 2021-10-14 DIAGNOSIS — M25561 Pain in right knee: Secondary | ICD-10-CM | POA: Diagnosis not present

## 2021-10-18 DIAGNOSIS — R262 Difficulty in walking, not elsewhere classified: Secondary | ICD-10-CM | POA: Diagnosis not present

## 2021-10-18 DIAGNOSIS — M25561 Pain in right knee: Secondary | ICD-10-CM | POA: Diagnosis not present

## 2021-10-18 DIAGNOSIS — Z96651 Presence of right artificial knee joint: Secondary | ICD-10-CM | POA: Diagnosis not present

## 2021-10-22 DIAGNOSIS — R262 Difficulty in walking, not elsewhere classified: Secondary | ICD-10-CM | POA: Diagnosis not present

## 2021-10-22 DIAGNOSIS — Z96651 Presence of right artificial knee joint: Secondary | ICD-10-CM | POA: Diagnosis not present

## 2021-10-22 DIAGNOSIS — M25561 Pain in right knee: Secondary | ICD-10-CM | POA: Diagnosis not present

## 2021-10-26 DIAGNOSIS — M25561 Pain in right knee: Secondary | ICD-10-CM | POA: Diagnosis not present

## 2021-10-26 DIAGNOSIS — R262 Difficulty in walking, not elsewhere classified: Secondary | ICD-10-CM | POA: Diagnosis not present

## 2021-10-26 DIAGNOSIS — Z96651 Presence of right artificial knee joint: Secondary | ICD-10-CM | POA: Diagnosis not present

## 2021-11-04 DIAGNOSIS — Z96651 Presence of right artificial knee joint: Secondary | ICD-10-CM | POA: Diagnosis not present

## 2021-11-04 DIAGNOSIS — M25561 Pain in right knee: Secondary | ICD-10-CM | POA: Diagnosis not present

## 2021-11-04 DIAGNOSIS — R262 Difficulty in walking, not elsewhere classified: Secondary | ICD-10-CM | POA: Diagnosis not present

## 2021-11-05 ENCOUNTER — Telehealth: Payer: Self-pay | Admitting: *Deleted

## 2021-11-05 ENCOUNTER — Encounter: Payer: Self-pay | Admitting: *Deleted

## 2021-11-05 NOTE — Patient Outreach (Signed)
  Care Coordination   Care Coordination  Visit Note   11/05/2021 Name: Walter Wilkerson MRN: [id number] DOB: Mar 19, 1950  Walter Wilkerson is a 71 y.o. year old male who sees [place], Royetta Car, [place] for primary care. I  connected successful with patient who declines providing HIPAA identifiers x 2- unable to complete call  What matters to the patients health and wellness today?  "I am not giving out my date of birth; I am doing fine and I will call my PCP to schedule an appointment soon"   SDOH assessments and interventions completed:  No    Care Coordination Interventions Activated:  No  Care Coordination Interventions:  No, not indicated patient refused call completion   Follow up plan: No further intervention required.   Encounter Outcome:  Pt. Visit Completed   Oneta Rack, RN, BSN, CCRN Alumnus RN CM Care Coordination/ Transition of [place] Management [phone]: direct office

## 2021-11-09 DIAGNOSIS — M25561 Pain in right knee: Secondary | ICD-10-CM | POA: Diagnosis not present

## 2021-11-09 DIAGNOSIS — M25461 Effusion, right knee: Secondary | ICD-10-CM | POA: Diagnosis not present

## 2021-11-09 DIAGNOSIS — Z96651 Presence of right artificial knee joint: Secondary | ICD-10-CM | POA: Diagnosis not present

## 2021-11-09 DIAGNOSIS — R262 Difficulty in walking, not elsewhere classified: Secondary | ICD-10-CM | POA: Diagnosis not present

## 2021-11-13 ENCOUNTER — Other Ambulatory Visit: Payer: Self-pay | Admitting: Physician Assistant

## 2021-11-14 ENCOUNTER — Other Ambulatory Visit: Payer: Self-pay | Admitting: Physician Assistant

## 2021-11-14 DIAGNOSIS — G894 Chronic pain syndrome: Secondary | ICD-10-CM

## 2021-11-29 DIAGNOSIS — Z9889 Other specified postprocedural states: Secondary | ICD-10-CM | POA: Diagnosis not present

## 2021-11-29 DIAGNOSIS — M25461 Effusion, right knee: Secondary | ICD-10-CM | POA: Diagnosis not present

## 2021-12-28 DIAGNOSIS — M1711 Unilateral primary osteoarthritis, right knee: Secondary | ICD-10-CM | POA: Diagnosis not present

## 2022-01-04 ENCOUNTER — Telehealth: Payer: Self-pay | Admitting: Physician Assistant

## 2022-01-04 MED ORDER — OSELTAMIVIR PHOSPHATE 75 MG PO CAPS: 75.0000 mg | ORAL_CAPSULE | Freq: Every day | ORAL | 0 refills | Status: DC

## 2022-01-04 NOTE — Telephone Encounter (Signed)
Wife positive for flu A. Sent preventative tamiflu to pharmacy to start once daily for 10 days.

## 2022-01-11 ENCOUNTER — Ambulatory Visit (INDEPENDENT_AMBULATORY_CARE_PROVIDER_SITE_OTHER): Payer: Medicare Other | Admitting: Physician Assistant

## 2022-01-11 ENCOUNTER — Encounter: Payer: Self-pay | Admitting: Physician Assistant

## 2022-01-11 VITALS — BP 174/81 | HR 88 | Ht 68.0 in | Wt 185.1 lb

## 2022-01-11 DIAGNOSIS — D696 Thrombocytopenia, unspecified: Secondary | ICD-10-CM | POA: Diagnosis not present

## 2022-01-11 DIAGNOSIS — E782 Mixed hyperlipidemia: Secondary | ICD-10-CM

## 2022-01-11 DIAGNOSIS — I10 Essential (primary) hypertension: Secondary | ICD-10-CM | POA: Diagnosis not present

## 2022-01-11 DIAGNOSIS — R053 Chronic cough: Secondary | ICD-10-CM | POA: Diagnosis not present

## 2022-01-11 DIAGNOSIS — Z0000 Encounter for general adult medical examination without abnormal findings: Secondary | ICD-10-CM | POA: Diagnosis not present

## 2022-01-11 DIAGNOSIS — E039 Hypothyroidism, unspecified: Secondary | ICD-10-CM

## 2022-01-11 MED ORDER — FLUTICASONE FUROATE-VILANTEROL 100-25 MCG/ACT IN AEPB: 1.0000 | INHALATION_SPRAY | Freq: Every day | RESPIRATORY_TRACT | 5 refills | Status: DC

## 2022-01-11 MED ORDER — AMLODIPINE BESYLATE 2.5 MG PO TABS: 2.5000 mg | ORAL_TABLET | Freq: Every day | ORAL | 0 refills | Status: DC

## 2022-01-11 NOTE — Patient Instructions (Signed)
Start BREO for chronic cough and shortness of breath Continue to use albuterol inhaler as needed Will order CT of chest for chronic cough  Add norvasc to blood pressure regimen

## 2022-01-11 NOTE — Progress Notes (Signed)
Complete physical exam  Patient: Walter Wilkerson   DOB: [birth year]   72 y.o. Male  MRN: [id number]  Subjective:    Chief Complaint  Patient presents with   Annual Exam    sob    Walter Wilkerson is a 72 y.o. male who presents today for a complete physical exam. He reports consuming a general diet. The patient does not participate in regular exercise at present. He generally feels fairly well. He reports sleeping fairly well. He does have additional problems to discuss today.   Pt has a chronic cough for months. Hx of lung cancer with lung resection. Former smoker. He is easier to get short of breath. Mucus is usually clear to white.    Most recent fall risk assessment:    01/11/2022    8:28 AM  Fall Risk   Falls in the past year? 0  Number falls in past yr: 0  Injury with Fall? 0  Risk for fall due to : No Fall Risks  Follow up Falls evaluation completed     Most recent depression screenings:    01/11/2022    8:29 AM 08/27/2021    1:14 PM  PHQ 2/9 Scores  PHQ - 2 Score 0 0    Vision:Within last year, Dental: No current dental problems and Receives regular dental care, and PSA: Agrees to PSA testing  Patient Active Problem List   Diagnosis Date Noted   Thrombocytopenia ([place]) 01/17/2022   EKG abnormalities 08/27/2021   Gastroesophageal reflux disease 08/27/2021   Hand cramp 04/14/2021   Leg cramps 04/14/2021   Polyarthritis 02/16/2021   Allergic sinusitis 06/03/2020   Lumbar spondylosis 12/03/2019   Chronic pain of right knee 12/03/2019   Right hand pain 12/03/2019   NSAID induced gastritis 12/03/2019   Primary osteoarthritis involving multiple joints 12/03/2019   Muscle cramps 09/03/2019   Elevated liver enzymes 09/03/2019   Chronic pain syndrome 05/29/2018   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 03/02/2018   Metatarsal stress fracture of right foot 01/15/2018   Nausea 04/20/2017   Epigastric pain 04/20/2017   PUD (peptic ulcer disease) 04/20/2017    Adhesive capsulitis 12/27/2016   Fatigue 11/04/2016   Stress at work 11/04/2016   Elevated serum creatinine 02/10/2016   Cyst of pharynx 01/12/2016   Aortic atherosclerosis ([place]) 01/12/2016   Dermatitis 10/18/2015   Prediabetes 04/15/2015   Hyperglycemia 04/14/2015   Shoulder impingement 02/24/2015   Acquired hypothyroidism 08/11/2014   Essential hypertension 08/11/2014   Hyperlipidemia 08/11/2014   Peripheral neuropathy ([place]) 08/11/2014   Non-small cell lung cancer ([place]) 08/11/2014   Past Medical History:  Diagnosis Date   Hypertension    Lung cancer ([place])    Thyroid disease    Family History  Problem Relation Age of Onset   Heart attack Father    Hyperlipidemia Father    Hypertension Father    Heart disease Father    Diabetes Other        grandmother    Allergies  Allergen Reactions   Merbromin Hives    Hives   Thimerosal (Thiomersal) Hives   Lipitor [Atorvastatin Calcium]     Myalgias.    Lisinopril Other (See Comments)   Pregabalin Other (See Comments)    Elevated LFTs      Patient Care Team: Lavada Mesi as PCP - General (Family Medicine) Dorna Leitz, MD as Consulting Physician (Orthopedic Surgery)   Outpatient Medications Prior to Visit  Medication Sig   celecoxib (CELEBREX)  200 MG capsule Take 200 mg by mouth 2 (two) times daily.   docusate sodium (COLACE) 100 MG capsule Take 100 mg by mouth 2 (two) times daily as needed.   oxyCODONE (OXY IR/ROXICODONE) 5 MG immediate release tablet Take 5-10 mg by mouth every 4 (four) hours as needed.   tiZANidine (ZANAFLEX) 4 MG tablet Take 4 mg by mouth every 8 (eight) hours as needed.   albuterol (VENTOLIN HFA) 108 (90 Base) MCG/ACT inhaler Inhale 2 puffs into the lungs every 4 (four) hours as needed.   aspirin EC 81 MG tablet Take 81 mg by mouth daily.   atorvastatin (LIPITOR) 40 MG tablet Take 1 tablet (40 mg total) by mouth daily.   diclofenac (VOLTAREN) 75 MG EC tablet Take 1 tablet by mouth twice a  day   DULoxetine (CYMBALTA) 30 MG capsule Take 1 capsule (30 mg total) by mouth daily.   famotidine (PEPCID) 20 MG tablet Take 1 tablet (20 mg total) by mouth 2 (two) times daily.   hydrALAZINE (APRESOLINE) 25 MG tablet Take 1 tablet (25 mg total) by mouth 2 (two) times daily.   Multiple Vitamin (MULTIVITAMIN) tablet Take 1 tablet by mouth daily.   predniSONE (DELTASONE) 1 MG tablet Take 1 tablet (1 mg total) by mouth daily with breakfast.   valsartan-hydrochlorothiazide (DIOVAN-HCT) 320-25 MG tablet Take 1 tablet by mouth daily.   [DISCONTINUED] levothyroxine (SYNTHROID) 150 MCG tablet Take 1 tablet (150 mcg total) by mouth daily before breakfast.   [DISCONTINUED] omeprazole (PRILOSEC) 40 MG capsule Take 1 capsule (40 mg total) by mouth daily.   [DISCONTINUED] oseltamivir (TAMIFLU) 75 MG capsule Take 1 capsule (75 mg total) by mouth daily.   No facility-administered medications prior to visit.    ROS   See HPI.      Objective:     BP (!) 174/81 (BP Location: Left Arm, Patient Position: Sitting, Cuff Size: Large)   Pulse 88   Ht 5\' 8"  (1.727 m)   Wt 185 lb 1.9 oz (84 kg)   SpO2 99%   BMI 28.15 kg/m  BP Readings from Last 3 Encounters:  01/11/22 (!) 174/81  09/14/21 (!) 144/84  08/27/21 (!) 140/66   Wt Readings from Last 3 Encounters:  01/11/22 185 lb 1.9 oz (84 kg)  09/14/21 185 lb (83.9 kg)  09/08/21 189 lb (85.7 kg)      Physical Exam  BP (!) 174/81 (BP Location: Left Arm, Patient Position: Sitting, Cuff Size: Large)   Pulse 88   Ht 5\' 8"  (1.727 m)   Wt 185 lb 1.9 oz (84 kg)   SpO2 99%   BMI 28.15 kg/m   General Appearance:    Alert, cooperative, no distress, appears stated age  Head:    Normocephalic, without obvious abnormality, atraumatic  Eyes:    PERRL, conjunctiva/corneas clear, EOM's intact, fundi    benign, both eyes       Ears:    Normal TM's and external ear canals, both ears  Nose:   Nares normal, septum midline, mucosa normal, no drainage    or  sinus tenderness  Throat:   Lips, mucosa, and tongue normal; teeth and gums normal  Neck:   Supple, symmetrical, trachea midline, no adenopathy;       thyroid:  No enlargement/tenderness/nodules; no carotid   bruit or JVD  Back:     Symmetric, no curvature, ROM normal, no CVA tenderness  Lungs:     Clear to auscultation bilaterally, respirations unlabored  Chest wall:  No tenderness or deformity  Heart:    Regular rate and rhythm, S1 and S2 normal, no murmur, rub   or gallop  Abdomen:     Soft, non-tender, bowel sounds active all four quadrants,    no masses, no organomegaly        Extremities:   Extremities normal, atraumatic, no cyanosis or edema  Pulses:   2+ and symmetric all extremities  Skin:   Skin color, texture, turgor normal, no rashes or lesions  Lymph nodes:   Cervical, supraclavicular, and axillary nodes normal  Neurologic:   CNII-XII intact. Normal strength, sensation and reflexes      throughout      Assessment & Plan:    Routine Health Maintenance and Physical Exam  Immunization History  Administered Date(s) Administered   Fluad Quad(high Dose 65+) 12/03/2019, 10/30/2020   Influenza,inj,Quad PF,6+ Mos 01/09/2013, 11/11/2014, 10/16/2015, 11/04/2016, 08/29/2018   Influenza-Unspecified 09/04/2017   Janssen (J&J) SARS-COV-2 Vaccination 03/24/2019, 02/02/2020   Pneumococcal Conjugate-13 04/13/2015   Pneumococcal Polysaccharide-23 11/04/2016   Tdap 09/22/2010, 10/30/2020   Zoster Recombinat (Shingrix) 11/26/2018, 06/11/2019    Health Maintenance  Topic Date Due   COVID-19 Vaccine (3 - 2023-24 season) 01/27/2022 (Originally 09/10/2021)   INFLUENZA VACCINE  04/10/2022 (Originally 08/10/2021)   Medicare Annual Wellness (AWV)  04/13/2022   COLONOSCOPY (Pts 45-6yrs Insurance coverage will need to be confirmed)  05/19/2023   DTaP/Tdap/Td (3 - Td or Tdap) 10/31/2030   Pneumonia Vaccine 38+ Years old  Completed   Hepatitis C Screening  Completed   Zoster Vaccines-  Shingrix  Completed   HPV VACCINES  Aged Out    Discussed health benefits of physical activity, and encouraged him to engage in regular exercise appropriate for his age and condition.  .. Discussed 150 minutes of exercise a week.  Encouraged vitamin D 1000 units and Calcium 1300mg  or 4 servings of dairy a day.  PHQ no concerns Fasting labs ordered Order CT of chest for chronic cough Use albuterol as needed start BREO daily BP not to goal Start norvasc low dose daily  Follow up in 1 month for recheck Colonoscopy UTD AUA low risk symptoms PSA ordered Vaccines UTD    Return in about 4 weeks (around 02/08/2022) for nurse visit for BP recheck. Iran Planas, PA-C

## 2022-01-12 LAB — CBC WITH DIFFERENTIAL/PLATELET
Absolute Monocytes: 654 cells/uL (ref 200–950)
Basophils Absolute: 72 cells/uL (ref 0–200)
Basophils Relative: 1.2 %
Eosinophils Absolute: 180 cells/uL (ref 15–500)
Eosinophils Relative: 3 %
HCT: 38.9 % (ref 38.5–50.0)
Hemoglobin: 12.7 g/dL — ABNORMAL LOW (ref 13.2–17.1)
Lymphs Abs: 1230 cells/uL (ref 850–3900)
MCH: 31 pg (ref 27.0–33.0)
MCHC: 32.6 g/dL (ref 32.0–36.0)
MCV: 94.9 fL (ref 80.0–100.0)
Monocytes Relative: 10.9 %
Neutro Abs: 3864 cells/uL (ref 1500–7800)
Neutrophils Relative %: 64.4 %
Platelets: 41 10*3/uL — ABNORMAL LOW (ref 140–400)
RBC: 4.1 10*6/uL — ABNORMAL LOW (ref 4.20–5.80)
RDW: 14.4 % (ref 11.0–15.0)
Total Lymphocyte: 20.5 %
WBC: 6 10*3/uL (ref 3.8–10.8)

## 2022-01-12 LAB — COMPLETE METABOLIC PANEL WITH GFR
AG Ratio: 1.3 (calc) (ref 1.0–2.5)
ALT: 14 U/L (ref 9–46)
AST: 21 U/L (ref 10–35)
Albumin: 4.3 g/dL (ref 3.6–5.1)
Alkaline phosphatase (APISO): 94 U/L (ref 35–144)
BUN/Creatinine Ratio: 17 (calc) (ref 6–22)
BUN: 25 mg/dL (ref 7–25)
CO2: 24 mmol/L (ref 20–32)
Calcium: 9.9 mg/dL (ref 8.6–10.3)
Chloride: 102 mmol/L (ref 98–110)
Creat: 1.44 mg/dL — ABNORMAL HIGH (ref 0.70–1.28)
Globulin: 3.2 g/dL (calc) (ref 1.9–3.7)
Glucose, Bld: 83 mg/dL (ref 65–99)
Potassium: 5 mmol/L (ref 3.5–5.3)
Sodium: 137 mmol/L (ref 135–146)
Total Bilirubin: 0.8 mg/dL (ref 0.2–1.2)
Total Protein: 7.5 g/dL (ref 6.1–8.1)
eGFR: 52 mL/min/{1.73_m2} — ABNORMAL LOW (ref >=60)

## 2022-01-12 LAB — LIPID PANEL W/REFLEX DIRECT LDL
Cholesterol: 198 mg/dL (ref <200)
HDL: 127 mg/dL (ref >=40)
LDL Cholesterol (Calc): 57 mg/dL (calc)
Non-HDL Cholesterol (Calc): 71 mg/dL (calc) (ref <130)
Total CHOL/HDL Ratio: 1.6 (calc) (ref <5.0)
Triglycerides: 61 mg/dL (ref <150)

## 2022-01-12 LAB — TSH: TSH: 10.51 mIU/L — ABNORMAL HIGH (ref 0.40–4.50)

## 2022-01-12 MED ORDER — FLUTICASONE FUROATE-VILANTEROL 100-25 MCG/ACT IN AEPB: 1.0000 | INHALATION_SPRAY | Freq: Every day | RESPIRATORY_TRACT | 5 refills | Status: DC

## 2022-01-12 NOTE — Progress Notes (Signed)
Bill,   Cholesterol looks great.  Platelets low again.  Hemoglobin up some.  Kidney function down some. Make sure staying hydrated.  TSH is elevated. Have you been taking synthroid daily? If so it needs to be increased to get you back into goal.

## 2022-01-13 DIAGNOSIS — M1711 Unilateral primary osteoarthritis, right knee: Secondary | ICD-10-CM | POA: Diagnosis not present

## 2022-01-14 MED ORDER — LEVOTHYROXINE SODIUM 175 MCG PO TABS: 175.0000 ug | ORAL_TABLET | Freq: Every day | ORAL | 1 refills | Status: DC

## 2022-01-17 DIAGNOSIS — D696 Thrombocytopenia, unspecified: Secondary | ICD-10-CM | POA: Insufficient documentation

## 2022-01-18 DIAGNOSIS — M25561 Pain in right knee: Secondary | ICD-10-CM | POA: Diagnosis not present

## 2022-01-26 DIAGNOSIS — S76111A Strain of right quadriceps muscle, fascia and tendon, initial encounter: Secondary | ICD-10-CM | POA: Diagnosis not present

## 2022-01-26 DIAGNOSIS — Z96651 Presence of right artificial knee joint: Secondary | ICD-10-CM | POA: Diagnosis not present

## 2022-02-03 DIAGNOSIS — S76111A Strain of right quadriceps muscle, fascia and tendon, initial encounter: Secondary | ICD-10-CM | POA: Diagnosis not present

## 2022-02-07 ENCOUNTER — Ambulatory Visit (INDEPENDENT_AMBULATORY_CARE_PROVIDER_SITE_OTHER): Payer: Medicare Other

## 2022-02-07 DIAGNOSIS — J439 Emphysema, unspecified: Secondary | ICD-10-CM | POA: Diagnosis not present

## 2022-02-07 DIAGNOSIS — R918 Other nonspecific abnormal finding of lung field: Secondary | ICD-10-CM | POA: Diagnosis not present

## 2022-02-07 DIAGNOSIS — R053 Chronic cough: Secondary | ICD-10-CM

## 2022-02-08 ENCOUNTER — Ambulatory Visit (INDEPENDENT_AMBULATORY_CARE_PROVIDER_SITE_OTHER): Payer: Medicare Other | Admitting: Physician Assistant

## 2022-02-08 VITALS — BP 144/73 | HR 75 | Resp 20

## 2022-02-08 DIAGNOSIS — I10 Essential (primary) hypertension: Secondary | ICD-10-CM

## 2022-02-08 MED ORDER — LEVOFLOXACIN 750 MG PO TABS: 750.0000 mg | ORAL_TABLET | Freq: Every day | ORAL | 0 refills | Status: DC

## 2022-02-08 NOTE — Progress Notes (Unsigned)
CT scan shows evidence of emphysema and pneumonia. Sent levaquin to pharmacy.

## 2022-02-08 NOTE — Progress Notes (Signed)
Ct scan shows:  Emphysema and area that appears infectious like pneumonia. I sent levaquin for 7 days. Continue to use albuterol as needed.  You also have a acute displaced 10th rib fracture that could cause some pain.   We do need to make sure symptoms are improving and pneumonia clearing. Follow up in 4 weeks or sooner if no improvement or cough worsening.

## 2022-02-08 NOTE — Progress Notes (Signed)
   Subjective:    Patient ID: Walter Wilkerson, male    DOB: 07-16-1950, 72 y.o.   MRN: [id number]  HPI Patient here for blood pressure check. Denies trouble sleeping, palpitations or medication problems.   Review of Systems     Objective:   Physical Exam        Assessment & Plan:   Patient advised to increase amlodipine to 5 MG and follow up with a nurse visit in 4 weeks for a blood pressure check.

## 2022-02-08 NOTE — Progress Notes (Signed)
BP still not to goal. Increase norvasc to 5mg  daily and recheck in 2 weeks with nurse BP check.

## 2022-02-24 DIAGNOSIS — S76111A Strain of right quadriceps muscle, fascia and tendon, initial encounter: Secondary | ICD-10-CM | POA: Diagnosis not present

## 2022-03-05 ENCOUNTER — Other Ambulatory Visit: Payer: Self-pay | Admitting: Physician Assistant

## 2022-03-08 ENCOUNTER — Encounter: Payer: Self-pay | Admitting: Physician Assistant

## 2022-03-08 ENCOUNTER — Ambulatory Visit (INDEPENDENT_AMBULATORY_CARE_PROVIDER_SITE_OTHER): Payer: Medicare Other | Admitting: Physician Assistant

## 2022-03-08 VITALS — BP 150/80 | HR 80 | Ht 68.0 in | Wt 186.0 lb

## 2022-03-08 DIAGNOSIS — R002 Palpitations: Secondary | ICD-10-CM | POA: Diagnosis not present

## 2022-03-08 DIAGNOSIS — R011 Cardiac murmur, unspecified: Secondary | ICD-10-CM | POA: Insufficient documentation

## 2022-03-08 DIAGNOSIS — R079 Chest pain, unspecified: Secondary | ICD-10-CM | POA: Diagnosis not present

## 2022-03-08 DIAGNOSIS — Z85118 Personal history of other malignant neoplasm of bronchus and lung: Secondary | ICD-10-CM

## 2022-03-08 DIAGNOSIS — R918 Other nonspecific abnormal finding of lung field: Secondary | ICD-10-CM | POA: Insufficient documentation

## 2022-03-08 DIAGNOSIS — I10 Essential (primary) hypertension: Secondary | ICD-10-CM | POA: Diagnosis not present

## 2022-03-08 DIAGNOSIS — J432 Centrilobular emphysema: Secondary | ICD-10-CM | POA: Diagnosis not present

## 2022-03-08 DIAGNOSIS — E782 Mixed hyperlipidemia: Secondary | ICD-10-CM

## 2022-03-08 DIAGNOSIS — I700 Atherosclerosis of aorta: Secondary | ICD-10-CM

## 2022-03-08 MED ORDER — AMLODIPINE BESYLATE 10 MG PO TABS: 10.0000 mg | ORAL_TABLET | Freq: Every day | ORAL | 0 refills | Status: DC

## 2022-03-08 MED ORDER — FLUTICASONE FUROATE-VILANTEROL 100-25 MCG/ACT IN AEPB: 1.0000 | INHALATION_SPRAY | Freq: Every day | RESPIRATORY_TRACT | 5 refills | Status: DC

## 2022-03-08 NOTE — Patient Instructions (Signed)
Continue BREO Will order CT scan of Chest Will make cardiology referral for new murmur Increase norvasc to '10mg'$  daily with goal BP under 130/80.  Follow up in 3 months

## 2022-03-08 NOTE — Progress Notes (Signed)
Established Patient Office Visit  Subjective   Patient ID: Walter Wilkerson, male    DOB: 30-Jun-1950  Age: 72 y.o. MRN: [id number]  Chief Complaint  Patient presents with   Hypertension    Hypertension   Patient is a 72 year old male here for 4 week follow up of hypertension. His amlodipine was increased to '5mg'$  after his physical. He has had no issues with this new dose. He denies any headaches, swelling or vision changes. He is checking BP at home and running 140-150s/70s-80s.   Patient mentions still experiencing feelings of "fluttering" of his heart with associated sharp chest pains. He says these do not occur frequently but on occasion. They usually happen at rest and resolve quickly. He had a normal stress test back in August but is still concerned with a family history of heart disease.   Patient asked to discuss his CT scan results from his last physical. Says he still experiences some some shortness of breath and a chronic cough but gets relief from his daily inhaler.   1. Interlobular septal thickening and centrilobular micro nodules in the left lower lobe are favored infectious/inflammatory. Given history of cancer follow up is recommended to ensure resolution after treatment. 2. Postoperative changes about the right lower lobe and elevated right hemidiaphragm. Subpleural area of consolidation along the posteromedial right lower lung along with reticular opacities and interstitial thickening in the right lower lung are presumed due to posttreatment change. Continued attention on follow-up. 3. Acute   .[name] Kitchen Active Ambulatory Problems    Diagnosis Date Noted   Acquired hypothyroidism 08/11/2014   Essential hypertension 08/11/2014   Hyperlipidemia 08/11/2014   Peripheral neuropathy ([place]) 08/11/2014   Non-small cell lung cancer ([place]) 08/11/2014   Shoulder impingement 02/24/2015   Hyperglycemia 04/14/2015   Prediabetes 04/15/2015   Dermatitis 10/18/2015   Cyst of pharynx  01/12/2016   Aortic atherosclerosis ([place]) 01/12/2016   Elevated serum creatinine 02/10/2016   Fatigue 11/04/2016   Stress at work 11/04/2016   Adhesive capsulitis 12/27/2016   Nausea 04/20/2017   Epigastric pain 04/20/2017   PUD (peptic ulcer disease) 04/20/2017   Metatarsal stress fracture of right foot 01/15/2018   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 03/02/2018   Chronic pain syndrome 05/29/2018   Muscle cramps 09/03/2019   Elevated liver enzymes 09/03/2019   Lumbar spondylosis 12/03/2019   Chronic pain of right knee 12/03/2019   Right hand pain 12/03/2019   NSAID induced gastritis 12/03/2019   Primary osteoarthritis involving multiple joints 12/03/2019   Allergic sinusitis 06/03/2020   Polyarthritis 02/16/2021   Hand cramp 04/14/2021   Leg cramps 04/14/2021   EKG abnormalities 08/27/2021   Gastroesophageal reflux disease 08/27/2021   Thrombocytopenia ([place]) 01/17/2022   Abnormal CT scan of lung 03/08/2022   Centrilobular emphysema ([place]) 03/08/2022   Intermittent chest pain 03/08/2022   Palpitations 03/08/2022   Newly recognized heart murmur 03/08/2022   Resolved Ambulatory Problems    Diagnosis Date Noted   Acute bronchitis 01/08/2015   Injury of right knee 09/03/2019   Past Medical History:  Diagnosis Date   Hypertension    Lung cancer ([place])    Thyroid disease      ROS See HPI    Objective:     BP (!) 150/80   Pulse 80   Ht '5\' 8"'$  (1.727 m)   Wt 186 lb (84.4 kg)   SpO2 99%   BMI 28.28 kg/m  BP Readings from Last 3 Encounters:  03/08/22 [name] Kitchen)  150/80  02/08/22 (!) 144/73  01/11/22 (!) 174/81   Wt Readings from Last 3 Encounters:  03/08/22 186 lb (84.4 kg)  01/11/22 185 lb 1.9 oz (84 kg)  09/14/21 185 lb (83.9 kg)      Physical Exam Vitals reviewed.  Constitutional:      Appearance: Normal appearance.  HENT:     Head: Normocephalic.  Cardiovascular:     Rate and Rhythm: Normal rate and regular rhythm.     Heart sounds: Murmur heard.      Comments: 4/6 SEM heard best of right 2nd intercostal space with post systolic click Pulmonary:     Effort: Pulmonary effort is normal.     Breath sounds: Normal breath sounds.  Musculoskeletal:        General: No swelling. Normal range of motion.     Right lower leg: No edema.     Left lower leg: No edema.  Neurological:     Mental Status: He is alert and oriented to person, place, and time.  Psychiatric:        Mood and Affect: Mood normal.     Last metabolic panel Lab Results  Component Value Date   GLUCOSE 83 01/11/2022   NA 137 01/11/2022   K 5.0 01/11/2022   CL 102 01/11/2022   CO2 24 01/11/2022   BUN 25 01/11/2022   CREATININE 1.44 (H) 01/11/2022   EGFR 52 (L) 01/11/2022   CALCIUM 9.9 01/11/2022   PROT 7.5 01/11/2022   ALBUMIN 4.1 09/14/2021   BILITOT 0.8 01/11/2022   ALKPHOS 78 09/14/2021   AST 21 01/11/2022   ALT 14 01/11/2022   ANIONGAP 8 09/14/2021   Last lipids Lab Results  Component Value Date   CHOL 198 01/11/2022   HDL 127 01/11/2022   LDLCALC 57 01/11/2022   TRIG 61 01/11/2022   CHOLHDL 1.6 01/11/2022   Last hemoglobin A1c Lab Results  Component Value Date   HGBA1C 5.5 02/26/2021          Assessment & Plan:  [name] KitchenRanger was seen today for hypertension.  Diagnoses and all orders for this visit:  Essential hypertension -     amLODipine (NORVASC) 10 MG tablet; Take 1 tablet (10 mg total) by mouth daily. -     Ambulatory referral to Cardiology  Aortic atherosclerosis [facility]) -     Ambulatory referral to Cardiology  Mixed hyperlipidemia -     Ambulatory referral to Cardiology  Centrilobular emphysema ([place]) -     fluticasone furoate-vilanterol (BREO ELLIPTA) 100-25 MCG/ACT AEPB; Inhale 1 puff into the lungs daily. -     CT Chest Wo Contrast; Future  Abnormal CT scan of lung -     CT Chest Wo Contrast; Future  History of lung cancer -     CT Chest Wo Contrast; Future  Newly recognized heart murmur -     Ambulatory referral to  Cardiology  Palpitations -     Ambulatory referral to Cardiology  Intermittent chest pain -     Ambulatory referral to Cardiology   Continue Breo Ellipta inhaler daily  Discussed CT scan results  Ordered follow up CT chest  Referral to cardiology for new murmur and palpitation/chest pain need for echo Recent LDL at goal on statin  BP not at goal, increased Amlodipine to '10mg'$   Continue to check BP at home with goal being under 130/80   Return in about 3 months (around 06/06/2022).    Iran Planas, PA-C

## 2022-03-21 ENCOUNTER — Other Ambulatory Visit: Payer: Self-pay | Admitting: Physician Assistant

## 2022-03-23 ENCOUNTER — Ambulatory Visit (INDEPENDENT_AMBULATORY_CARE_PROVIDER_SITE_OTHER): Payer: Medicare Other

## 2022-03-23 DIAGNOSIS — R918 Other nonspecific abnormal finding of lung field: Secondary | ICD-10-CM | POA: Diagnosis not present

## 2022-03-23 DIAGNOSIS — J432 Centrilobular emphysema: Secondary | ICD-10-CM | POA: Diagnosis not present

## 2022-03-23 DIAGNOSIS — Z85118 Personal history of other malignant neoplasm of bronchus and lung: Secondary | ICD-10-CM | POA: Diagnosis not present

## 2022-03-23 DIAGNOSIS — J849 Interstitial pulmonary disease, unspecified: Secondary | ICD-10-CM | POA: Diagnosis not present

## 2022-03-25 ENCOUNTER — Other Ambulatory Visit: Payer: Self-pay | Admitting: Physician Assistant

## 2022-03-25 ENCOUNTER — Encounter: Payer: Self-pay | Admitting: Physician Assistant

## 2022-03-25 DIAGNOSIS — R918 Other nonspecific abnormal finding of lung field: Secondary | ICD-10-CM | POA: Insufficient documentation

## 2022-03-25 DIAGNOSIS — J432 Centrilobular emphysema: Secondary | ICD-10-CM

## 2022-03-25 DIAGNOSIS — Z85118 Personal history of other malignant neoplasm of bronchus and lung: Secondary | ICD-10-CM | POA: Insufficient documentation

## 2022-03-25 DIAGNOSIS — I2510 Atherosclerotic heart disease of native coronary artery without angina pectoris: Secondary | ICD-10-CM | POA: Insufficient documentation

## 2022-03-25 DIAGNOSIS — I714 Abdominal aortic aneurysm, without rupture, unspecified: Secondary | ICD-10-CM | POA: Insufficient documentation

## 2022-03-25 NOTE — Progress Notes (Signed)
Pt was called and discuss left upper and lower lung nodules that are suspicious for malignancy. Will refer to pulmonology and oncology.

## 2022-03-28 ENCOUNTER — Other Ambulatory Visit: Payer: Self-pay | Admitting: Physician Assistant

## 2022-03-28 DIAGNOSIS — M130 Polyarthritis, unspecified: Secondary | ICD-10-CM

## 2022-03-28 DIAGNOSIS — I10 Essential (primary) hypertension: Secondary | ICD-10-CM

## 2022-03-30 ENCOUNTER — Inpatient Hospital Stay (HOSPITAL_BASED_OUTPATIENT_CLINIC_OR_DEPARTMENT_OTHER): Payer: Medicare Other | Admitting: Hematology & Oncology

## 2022-03-30 ENCOUNTER — Other Ambulatory Visit: Payer: Self-pay

## 2022-03-30 ENCOUNTER — Encounter: Payer: Self-pay | Admitting: Hematology & Oncology

## 2022-03-30 ENCOUNTER — Encounter: Payer: Self-pay | Admitting: *Deleted

## 2022-03-30 ENCOUNTER — Inpatient Hospital Stay: Payer: Medicare Other | Attending: Hematology & Oncology

## 2022-03-30 VITALS — BP 143/72 | HR 79 | Temp 98.0°F | Resp 18 | Ht 68.0 in | Wt 188.0 lb

## 2022-03-30 DIAGNOSIS — I10 Essential (primary) hypertension: Secondary | ICD-10-CM | POA: Diagnosis not present

## 2022-03-30 DIAGNOSIS — Z87891 Personal history of nicotine dependence: Secondary | ICD-10-CM | POA: Insufficient documentation

## 2022-03-30 DIAGNOSIS — Z9221 Personal history of antineoplastic chemotherapy: Secondary | ICD-10-CM

## 2022-03-30 DIAGNOSIS — E079 Disorder of thyroid, unspecified: Secondary | ICD-10-CM

## 2022-03-30 DIAGNOSIS — C3491 Malignant neoplasm of unspecified part of right bronchus or lung: Secondary | ICD-10-CM

## 2022-03-30 DIAGNOSIS — R059 Cough, unspecified: Secondary | ICD-10-CM

## 2022-03-30 DIAGNOSIS — Z79899 Other long term (current) drug therapy: Secondary | ICD-10-CM | POA: Diagnosis not present

## 2022-03-30 DIAGNOSIS — Z85118 Personal history of other malignant neoplasm of bronchus and lung: Secondary | ICD-10-CM | POA: Diagnosis not present

## 2022-03-30 DIAGNOSIS — R918 Other nonspecific abnormal finding of lung field: Secondary | ICD-10-CM | POA: Diagnosis not present

## 2022-03-30 LAB — CMP (CANCER CENTER ONLY)
ALT: 32 U/L (ref 0–44)
AST: 32 U/L (ref 15–41)
Albumin: 4.2 g/dL (ref 3.5–5.0)
Alkaline Phosphatase: 90 U/L (ref 38–126)
Anion gap: 10 (ref 5–15)
BUN: 28 mg/dL — ABNORMAL HIGH (ref 8–23)
CO2: 25 mmol/L (ref 22–32)
Calcium: 9.7 mg/dL (ref 8.9–10.3)
Chloride: 101 mmol/L (ref 98–111)
Creatinine: 1.35 mg/dL — ABNORMAL HIGH (ref 0.61–1.24)
GFR, Estimated: 56 mL/min — ABNORMAL LOW (ref >60)
Glucose, Bld: 95 mg/dL (ref 70–99)
Potassium: 4.9 mmol/L (ref 3.5–5.1)
Sodium: 136 mmol/L (ref 135–145)
Total Bilirubin: 0.6 mg/dL (ref 0.3–1.2)
Total Protein: 7.4 g/dL (ref 6.5–8.1)

## 2022-03-30 LAB — CBC WITH DIFFERENTIAL (CANCER CENTER ONLY)
Abs Immature Granulocytes: 0.02 10*3/uL (ref 0.00–0.07)
Basophils Absolute: 0.1 10*3/uL (ref 0.0–0.1)
Basophils Relative: 1 %
Eosinophils Absolute: 0.2 10*3/uL (ref 0.0–0.5)
Eosinophils Relative: 2 %
HCT: 32.7 % — ABNORMAL LOW (ref 39.0–52.0)
Hemoglobin: 11.2 g/dL — ABNORMAL LOW (ref 13.0–17.0)
Immature Granulocytes: 0 %
Lymphocytes Relative: 15 %
Lymphs Abs: 1.2 10*3/uL (ref 0.7–4.0)
MCH: 31.6 pg (ref 26.0–34.0)
MCHC: 34.3 g/dL (ref 30.0–36.0)
MCV: 92.4 fL (ref 80.0–100.0)
Monocytes Absolute: 0.7 10*3/uL (ref 0.1–1.0)
Monocytes Relative: 9 %
Neutro Abs: 6.1 10*3/uL (ref 1.7–7.7)
Neutrophils Relative %: 73 %
Platelet Count: 210 10*3/uL (ref 150–400)
RBC: 3.54 MIL/uL — ABNORMAL LOW (ref 4.22–5.81)
RDW: 16.1 % — ABNORMAL HIGH (ref 11.5–15.5)
WBC Count: 8.3 10*3/uL (ref 4.0–10.5)
nRBC: 0 % (ref 0.0–0.2)

## 2022-03-30 LAB — LACTATE DEHYDROGENASE: LDH: 177 U/L (ref 98–192)

## 2022-03-30 LAB — CEA (IN HOUSE-CHCC): CEA (CHCC-In House): 3.53 ng/mL (ref 0.00–5.00)

## 2022-03-30 NOTE — Progress Notes (Signed)
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  HPI: Walter Wilkerson is a very nice 72 year old male.  He comes in with his wife.  He served in the [employer].  As such, he is a true American hero.  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  He really has some good stories to tell.  He really met quite a few country stars.  His cancer history goes back to the 2008 when he was found to have a malignancy in the right lung.  I do this all showed up when he had pulmonary hypertrophic osteoarthropathy.  He went to see rheumatology.  Rheumatology was incredibly astute thinking that he did have a malignancy.  They found that he had a tumor in the right lung.  He underwent surgical resection.  Unfortunate, I do not have the full details.  However think it was stage II (T2N1M0) bronchogenic carcinoma.  I do not know if this was adenocarcinoma or squamous cell carcinoma.  He did get adjuvant chemotherapy.  He said he got weekly chemotherapy for 8 weeks.  Since then, he is doing okay.  He really has not had any kind of follow-up for his cancer for many years.  He had a constant cough.  This was nonproductive.  There is no hemoptysis.  He went to see his family doctor.  She went ahead and did a CT scan of his chest.  This was done on 03/23/2022.  Surprising, this showed a 5 x 11 mm nodule in the left upper lung.  There is a 4 mm nodule in the left lower lung.  There is no adenopathy in the mediastinum or hilum.  There is no evidence of any metastatic disease.  Based on this, she referred him to the [facility].  He did smoke.  He stopped back in 2006.  He probably had about a 40-pack-year history of  tobacco use.  He does have some alcohol use.  He has had no weight loss.  There is been no nausea or vomiting.  He is up-to-date with his colonoscopy.  Currently, I would say his performance status is probably ECOG 1.    Past Medical History:  Diagnosis Date   Hypertension    Lung cancer ([place])    Thyroid disease   :   Past Surgical History:  Procedure Laterality Date   LUNG LOBECTOMY Right 2008   RLL resection   VASECTOMY    :   Current Outpatient Medications:    albuterol (VENTOLIN HFA) 108 (90 Base) MCG/ACT inhaler, Inhale 2 puffs into the lungs every 4 (four) hours as needed., Disp: 6.7 g, Rfl: 0   amLODipine (NORVASC) 10 MG tablet, Take 1 tablet (10 mg total) by mouth daily., Disp: 90 tablet, Rfl: 0   aspirin EC 81 MG tablet, Take 81 mg by mouth daily., Disp: , Rfl:    atorvastatin (LIPITOR) 40 MG tablet, Take 1 tablet (40 mg total) by mouth daily., Disp: 90 tablet, Rfl: 3   celecoxib (CELEBREX) 200 MG capsule, Take 200 mg by mouth 2 (two) times daily., Disp: , Rfl:    diclofenac (VOLTAREN) 75 MG EC tablet,  Take 1 tablet by mouth twice a day, Disp: 180 tablet, Rfl: 1   docusate sodium (COLACE) 100 MG capsule, Take 100 mg by mouth 2 (two) times daily as needed., Disp: , Rfl:    DULoxetine (CYMBALTA) 30 MG capsule, Take 1 capsule (30 mg total) by mouth daily., Disp: 90 capsule, Rfl: 1   famotidine (PEPCID) 20 MG tablet, Take 1 tablet (20 mg total) by mouth 2 (two) times daily., Disp: 180 tablet, Rfl: 3   fluticasone furoate-vilanterol (BREO ELLIPTA) 100-25 MCG/ACT AEPB, Inhale 1 puff into the lungs daily., Disp: 60 each, Rfl: 5   hydrALAZINE (APRESOLINE) 25 MG tablet, Take 1 tablet (25 mg total) by mouth 2 (two) times daily., Disp: 180 tablet, Rfl: 1   levothyroxine (SYNTHROID) 175 MCG tablet, Take 1 tablet (175 mcg total) by mouth daily. Needs labs, Disp: 30 tablet, Rfl: 0   Multiple Vitamin (MULTIVITAMIN) tablet, Take 1 tablet by mouth daily., Disp: , Rfl:     valsartan-hydrochlorothiazide (DIOVAN-HCT) 320-25 MG tablet, Take 1 tablet by mouth daily., Disp: 90 tablet, Rfl: 1:  : Allergies  Allergen Reactions   Pregabalin Other (See Comments)    Elevated LFTs   Lipitor [Atorvastatin Calcium] Other (See Comments)    Myalgias.    Lisinopril Nausea Only   Merbromin Hives   Thimerosal (Thiomersal) Hives  :   Family History  Problem Relation Age of Onset   Heart attack Father    Hyperlipidemia Father    Hypertension Father    Heart disease Father    Diabetes Other        grandmother   :   Social History   Socioeconomic History   Marital status: Married    Spouse name: Walter Wilkerson   Number of children: 3   Years of education: 12   Highest education level: 12th grade  Occupational History   Occupation: [employer]    Comment: part time  Tobacco Use   Smoking status: Former    Types: Cigarettes    Quit date: 08/10/2004    Years since quitting: 17.6   Smokeless tobacco: Never  Vaping Use   Vaping Use: Never used  Substance and Sexual Activity   Alcohol use: Not Currently    Alcohol/week: 21.0 standard drinks of alcohol    Types: 21 Shots of liquor per week    Comment: 3 shots every night   Drug use: No   Sexual activity: Not Currently    Partners: Female  Other Topics Concern   Not on file  Social History Narrative   Lives with his wife and son. He enjoys working outside and doing Haematologist.   Social Determinants of Health   Financial Resource Strain: Low Risk  (04/12/2021)   Overall Financial Resource Strain (CARDIA)    Difficulty of Paying Living Expenses: Not hard at all  Food Insecurity: No Food Insecurity (03/30/2022)   Hunger Vital Sign    Worried About Running Out of Food in the Last Year: Never true    Ran Out of Food in the Last Year: Never true  Transportation Needs: No Transportation Needs (03/30/2022)   PRAPARE - Hydrologist (Medical): No    Lack of Transportation (Non-Medical):  No  Physical Activity: Inactive (04/12/2021)   Exercise Vital Sign    Days of Exercise per Week: 0 days    Minutes of Exercise per Session: 0 min  Stress: No Stress Concern Present (04/12/2021)   [place]  Stress Questionnaire    Feeling of Stress : Not at all  Social Connections: Moderately Integrated (04/12/2021)   Social Connection and Isolation Panel [NHANES]    Frequency of Communication with Friends and Family: More than three times a week    Frequency of Social Gatherings with Friends and Family: Once a week    Attends Religious Services: More than 4 times per year    Active Member of [employer] or Organizations: No    Attends Archivist Meetings: Never    Marital Status: Married  Human resources officer Violence: Not At Risk (03/30/2022)   Humiliation, Afraid, Rape, and Kick questionnaire    Fear of Current or Ex-Partner: No    Emotionally Abused: No    Physically Abused: No    Sexually Abused: No  :    Review of Systems  Constitutional: Negative.   HENT: Negative.    Eyes: Negative.   Respiratory:  Positive for cough.   Cardiovascular: Negative.   Gastrointestinal: Negative.   Genitourinary: Negative.   Musculoskeletal: Negative.   Skin: Negative.   Neurological: Negative.   Endo/Heme/Allergies: Negative.   Psychiatric/Behavioral: Negative.       Exam: Vital signs show temperature of 98.  Pulse 79.  Blood pressure 143/72.  Weight is 188 pounds.  @IPVITALS @ Physical Exam Vitals reviewed.  HENT:     Head: Normocephalic and atraumatic.  Eyes:     Pupils: Pupils are equal, round, and reactive to light.  Cardiovascular:     Rate and Rhythm: Normal rate and regular rhythm.     Heart sounds: Normal heart sounds.  Pulmonary:     Effort: Pulmonary effort is normal.     Breath sounds: Normal breath sounds.  Abdominal:     General: Bowel sounds are normal.     Palpations: Abdomen is soft.  Musculoskeletal:        General:  No tenderness or deformity. Normal range of motion.     Cervical back: Normal range of motion.  Lymphadenopathy:     Cervical: No cervical adenopathy.  Skin:    General: Skin is warm and dry.     Findings: No erythema or rash.  Neurological:     Mental Status: He is alert and oriented to person, place, and time.  Psychiatric:        Behavior: Behavior normal.        Thought Content: Thought content normal.        Judgment: Judgment normal.     Recent Labs    03/30/22 1102  WBC 8.3  HGB 11.2*  HCT 32.7*  PLT 210    Recent Labs    03/30/22 1102  NA 136  K 4.9  CL 101  CO2 25  GLUCOSE 95  BUN 28*  CREATININE 1.35*  CALCIUM 9.7    Blood smear review: None  Pathology: None    Assessment and Plan: Walter Wilkerson is a very nice 72 year old white male.  He had a history of stage II non-small cell lung cancer about 16 years ago.  He now has a couple small nodules in the left lung.  Again, is hard to say if these are malignant.  I forgot to mention that he does have underlying rheumatologic issues.  I would think it be incredibly unusual to see recurrence from his initial lung cancer 16 years after the fact.  I think would be best would be to do a PET scan on him.  I think that the left upper lung  lesion would be able to be picked up on a PET scan.  The left lower lung nodule probably would not.  We will also see if the PET scan can pick up anything else.  I am not sure if you ever do a biopsy.  These lesions are small in the peripheral.  It is possible that if we do see activity on a PET scan, that we might think about radiosurgery.  Again, Her radiation Oncology might want to have a biopsy before they would treat him.  He and his wife are both delightful to talk to.  I am so glad I was able to meet with him and try to help him out.  We will try to get the PET scan and a couple weeks.  I will then be in touch with him.  Hopefully, we can take care of a lot of this over  the phone.

## 2022-03-30 NOTE — Progress Notes (Signed)
Initial RN Navigator Patient Visit  Name: Walter Wilkerson Date of Referral : 03/25/2022 Diagnosis: Lung Nodules  Met with patient prior to their visit with MD. Hanley Seamen patient MD and Navigator business card. Reviewed with patient the general overview of expected course after initial diagnosis and time frame for all steps to be completed.  Patient comes in with his wife. He works part time. He states his employer is supportive and flexible and he doesn't have any scheduling restrictions. He has his wife, and son, who lives close by, as a support.   Will follow up with patient tomorrow after note dictated and orders placed.  Patient understands all follow up procedures and expectations. They have my number to reach out for any further clarification or additional needs.    Oncology Nurse Navigator Documentation     03/30/2022   11:00 AM  Oncology Nurse Navigator Flowsheets  Abnormal Finding Date 03/25/2022  Diagnosis Status Additional Work Up  Navigator Follow Up Date: 03/31/2022  Navigator Follow Up Reason: Appointment Review  Navigator Location [facility]  Referral Date to RadOnc/MedOnc 03/25/2022  Navigator Encounter Type Initial MedOnc  Patient Visit Type MedOnc  Treatment Phase Abnormal Scans  Barriers/Navigation Needs Coordination of Care;Education  Interventions Education;Psycho-Social Support  Acuity Level 2-Minimal Needs (1-2 Barriers Identified)  Education Method Verbal;Written  Support Groups/Services Friends and Family  Time Spent with Patient 30

## 2022-03-31 ENCOUNTER — Encounter: Payer: Self-pay | Admitting: *Deleted

## 2022-03-31 NOTE — Progress Notes (Signed)
Dr Marin Olp needs medical records from 2009, specifically his path and treatment regimen. Able to locate path on Care Everywhere, but not information regarding his treatment. Medical Record request sent to [facility] [phone])  At this time, patient needs a PET scan to continue work-up/staging. Scheduled for 04/18/2022.  Patient is aware of PET appointment including date, time, and location. The following prep is reviewed with patient and confirmed with teachback: - arrive 30 minutes before appointment time - NPO except water for 6h before scan. No candy, no gum - hold any diabetic medication the morning of the scan - have a low carb dinner the night prior Radiology Information sheet also mailed to patient's home for reinforcement of education.   Oncology Nurse Navigator Documentation     03/31/2022    8:00 AM  Oncology Nurse Navigator Flowsheets  Navigator Follow Up Date: 04/18/2022  Navigator Follow Up Reason: Scan Review  Navigator Location [facility]  Navigator Encounter Type Appt/Treatment Plan Review;Telephone  Telephone Appt Confirmation/Clarification;Education;Outgoing Call  Patient Visit Type MedOnc  Treatment Phase Abnormal Scans  Barriers/Navigation Needs Coordination of Care;Education  Education Other  Interventions Coordination of Care;Education;Psycho-Social Support  Acuity Level 2-Minimal Needs (1-2 Barriers Identified)  Coordination of Care Radiology  Education Method Verbal;Written;Teach-back  Support Groups/Services Friends and Family  Time Spent with Patient 42

## 2022-04-04 ENCOUNTER — Ambulatory Visit (HOSPITAL_BASED_OUTPATIENT_CLINIC_OR_DEPARTMENT_OTHER): Payer: Medicare Other | Admitting: Pulmonary Disease

## 2022-04-04 ENCOUNTER — Encounter (HOSPITAL_BASED_OUTPATIENT_CLINIC_OR_DEPARTMENT_OTHER): Payer: Self-pay | Admitting: Pulmonary Disease

## 2022-04-04 VITALS — BP 128/64 | HR 88 | Wt 187.7 lb

## 2022-04-04 DIAGNOSIS — J432 Centrilobular emphysema: Secondary | ICD-10-CM | POA: Diagnosis not present

## 2022-04-04 DIAGNOSIS — R0683 Snoring: Secondary | ICD-10-CM | POA: Diagnosis not present

## 2022-04-04 MED ORDER — TRELEGY ELLIPTA 100-62.5-25 MCG/ACT IN AEPB: 1.0000 | INHALATION_SPRAY | Freq: Every day | RESPIRATORY_TRACT | 5 refills | Status: DC

## 2022-04-04 NOTE — Progress Notes (Signed)
[facility]  Chief Complaint  Patient presents with   Consult    Referred by PCP for possible emphysema. States he has been more SOB for the past few months. Tends to get worse with exertion. Also tends to have some leg swelling.     Past Surgical History:  He  has a past surgical history that includes Vasectomy and Lung lobectomy (Right, 2008).  Past Medical History:  HTN, Lung cancer s/p Rt lower lobectomy, Hypothyroidism  Constitutional:  BP 128/64   Pulse 88   Wt 187 lb 11.6 oz (85.2 kg)   SpO2 98% Comment: on RA  BMI 28.54 kg/m   Brief Summary:  Walter Wilkerson is a 72 y.o. male forme smoker with emphysema, lung nodule and snoring.      Subjective:   He is here with his wife.  He used to exercise on a regular basis.  He had knee surgery and that hasn't been able to do much activity since without feeling short of breath.  He has a cough with clear sputum and gets wheezing at times also.  He smoked 1 to 2 ppd, but quit years ago.  He work in Optometrist.  He was stationed at [employer] in during his service with the [employer] in the 1970's.  He was started on inhalers recently.  These help some but he still has symptoms.  He gets fluttering in his chest sometimes and then gets short of breath.  His norvasc was increased about 2 months ago.  For the past 2 weeks he has noticed more lower leg swelling.    He had a sleep study about 15 years ago and was told he had sleep apnea.  He was tried on CPAP but had trouble using this.  His wife is sure he still has sleep apnea.  He snores and stops breathing while asleep.  He gets sleepy during the day.  He had pneumonia years ago, and was told he had pneumonia in January of this year also.  No history of TB or thromboembolic disease.  Physical Exam:   Appearance - well kempt   ENMT - no sinus tenderness, no oral exudate, no LAN, Mallampati 3 airway, no stridor  Respiratory - equal breath  sounds bilaterally, no wheezing or rales  CV - s1s2 regular rate and rhythm, no murmurs  Ext - no clubbing, no edema  Skin - no rashes  Psych - normal mood and affect   Pulmonary testing:    Chest Imaging:  CT chest 03/23/22 >> atherosclerosis, ascending aorta 4 cm, calcified subcarinal and hilar LN, centrilobular and paraseptal emphysema, 1.1 x 0.5 cm nodule LUL (was 5 mm on 02/07/22), granuloma LLL, new 4 mm nodule LLL  Sleep Tests:    Cardiac Tests:    Social History:  He  reports that he quit smoking about 17 years ago. His smoking use included cigarettes. He has never used smokeless tobacco. He reports that he does not currently use alcohol after a past usage of about 21.0 standard drinks of alcohol per week. He reports that he does not use drugs.  Family History:  His family history includes Diabetes in an other family member; Heart attack in his father; Heart disease in his father; Hyperlipidemia in his father; Hypertension in his father.     Assessment/Plan:   Centrilobular emphysema with history of tobacco abuse and progressive dyspnea. - likely has COPD - will arrange for pulmonary function test - will  change from breo to trelegy 100 one puff daily to see if adding LAMA to LABA/ICS improves symptom control - prn albuterol  Lt upper lobe lung nodule. - seems unlikely this is a recurrence of prior lung cancer from 2009 - he has been seen by Dr. Burney Gauze with oncology - PET scan is scheduled for April 2024; treatment options to be determined after review of this  Snoring. - associated with sleep disruption, apnea, and daytime sleepiness - he has history of hypertension and had a sleep study about 15 years ago that reportedly showed sleep apnea - I am concerned he still has sleep apnea now - will arrange for a home sleep study to assess further  Dyspnea on exertion, palpitations, aortic dilation, hypertension. - he was cardiology appointment with cardiology  scheduled in April 2024  Bilateral ankle swelling. - explained this could be related to recent increase in dose of norvasc, especially if his cardiac assessment is unrevealing - he would discuss with PCP and cardiology about whether he needs adjustment to his medication regimen   Time Spent Involved in Patient Care on Day of Examination:  52 minutes  Follow up:   Patient Instructions  Trelegy one puff daily, and rinse your mouth after each use  Stop using breo once you get trelegy  Albuterol two puffs every 6 hours as needed for cough, wheeze, shortness of breath or chest congestion  Will arrange for a home sleep study and pulmonary function test  Follow up in 3 to 4 weeks  Medication List:   Allergies as of 04/04/2022       Reactions   Pregabalin Other (See Comments)   Elevated LFTs   Lipitor [atorvastatin Calcium] Other (See Comments)   Myalgias.    Lisinopril Nausea Only   Merbromin Hives   Thimerosal (thiomersal) Hives        Medication List        Accurate as of April 04, 2022  4:46 PM. If you have any questions, ask your nurse or doctor.          STOP taking these medications    fluticasone furoate-vilanterol 100-25 MCG/ACT Aepb Commonly known as: BREO ELLIPTA Stopped by: Chesley Mires, MD       TAKE these medications    albuterol 108 (90 Base) MCG/ACT inhaler Commonly known as: VENTOLIN HFA Inhale 2 puffs into the lungs every 4 (four) hours as needed.   amLODipine 10 MG tablet Commonly known as: NORVASC Take 1 tablet (10 mg total) by mouth daily.   aspirin EC 81 MG tablet Take 81 mg by mouth daily.   atorvastatin 40 MG tablet Commonly known as: LIPITOR Take 1 tablet (40 mg total) by mouth daily.   celecoxib 200 MG capsule Commonly known as: CELEBREX Take 200 mg by mouth 2 (two) times daily.   diclofenac 75 MG EC tablet Commonly known as: VOLTAREN Take 1 tablet by mouth twice a day   docusate sodium 100 MG capsule Commonly known  as: COLACE Take 100 mg by mouth 2 (two) times daily as needed.   DULoxetine 30 MG capsule Commonly known as: CYMBALTA Take 1 capsule (30 mg total) by mouth daily.   famotidine 20 MG tablet Commonly known as: PEPCID Take 1 tablet (20 mg total) by mouth 2 (two) times daily.   hydrALAZINE 25 MG tablet Commonly known as: APRESOLINE Take 1 tablet (25 mg total) by mouth 2 (two) times daily.   levothyroxine 175 MCG tablet Commonly known as: SYNTHROID  Take 1 tablet (175 mcg total) by mouth daily. Needs labs   multivitamin tablet Take 1 tablet by mouth daily.   Trelegy Ellipta 100-62.5-25 MCG/ACT Aepb Generic drug: Fluticasone-Umeclidin-Vilant Inhale 1 puff into the lungs daily in the afternoon. Started by: Chesley Mires, MD   valsartan-hydrochlorothiazide 320-25 MG tablet Commonly known as: DIOVAN-HCT Take 1 tablet by mouth daily.        Signature:  Chesley Mires, MD [facility] Pager - [phone] 04/04/2022, 4:46 PM

## 2022-04-04 NOTE — Patient Instructions (Signed)
Trelegy one puff daily, and rinse your mouth after each use  Stop using breo once you get trelegy  Albuterol two puffs every 6 hours as needed for cough, wheeze, shortness of breath or chest congestion  Will arrange for a home sleep study and pulmonary function test  Follow up in 3 to 4 weeks

## 2022-04-05 ENCOUNTER — Telehealth: Payer: Self-pay | Admitting: Neurology

## 2022-04-05 ENCOUNTER — Telehealth: Payer: Self-pay | Admitting: Pulmonary Disease

## 2022-04-05 MED ORDER — AMLODIPINE BESYLATE 5 MG PO TABS: 5.0000 mg | ORAL_TABLET | Freq: Every day | ORAL | 0 refills | Status: DC

## 2022-04-05 MED ORDER — TRELEGY ELLIPTA 100-62.5-25 MCG/ACT IN AEPB: 1.0000 | INHALATION_SPRAY | Freq: Every day | RESPIRATORY_TRACT | 5 refills | Status: AC

## 2022-04-05 NOTE — Telephone Encounter (Signed)
New RX has been sent to [facility] with updated quantity.

## 2022-04-05 NOTE — Telephone Encounter (Signed)
Patient made aware of results. He will double up RX for Hydralazine and 5 mg of Norvasc sent to pharmacy. He will have BP checked in 2 weeks.

## 2022-04-05 NOTE — Telephone Encounter (Signed)
Ok back down to 5mg  of norvasc daily and increase hydralazine to 50mg (2 tablets) twice a day. Recheck BP in 2 weeks. Do you need any refills?

## 2022-04-05 NOTE — Telephone Encounter (Signed)
[facility] states Trelegy Ellipta needs to be changed to quantity 60 each. [facility] phone number is [phone].

## 2022-04-05 NOTE — Telephone Encounter (Signed)
Patient called and LVM stating he is having side effects from new blood pressure dosage. He states blood pressure is good but having swelling in legs/feet. He saw pulmonology today and they instructed him to call us and that it could be the change in blood pressure meds. Please advise.

## 2022-04-08 ENCOUNTER — Ambulatory Visit: Payer: Medicare Other

## 2022-04-08 ENCOUNTER — Encounter (HOSPITAL_BASED_OUTPATIENT_CLINIC_OR_DEPARTMENT_OTHER): Payer: Medicare Other

## 2022-04-08 DIAGNOSIS — G4733 Obstructive sleep apnea (adult) (pediatric): Secondary | ICD-10-CM | POA: Diagnosis not present

## 2022-04-08 DIAGNOSIS — R0683 Snoring: Secondary | ICD-10-CM

## 2022-04-11 ENCOUNTER — Telehealth: Payer: Self-pay | Admitting: Pulmonary Disease

## 2022-04-11 DIAGNOSIS — G4733 Obstructive sleep apnea (adult) (pediatric): Secondary | ICD-10-CM | POA: Diagnosis not present

## 2022-04-11 NOTE — Telephone Encounter (Signed)
HST 04/10/22 >> AHI 36.6, SpO2 low 76%.  Spent 214.4 min with SpO2 < 89%.   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  Will review in detail and go over next steps at his ROV on 05/05/22.

## 2022-04-11 NOTE — Telephone Encounter (Signed)
ATC patient.  Left detailed vm on cell phone (ok per dpr) letting patient know his results and that Dr>S ood will review and og over plan with him at ov on 05/05/2022.  Advised patient to call back for questions.  Nothing further needed at this time.

## 2022-04-18 ENCOUNTER — Encounter (HOSPITAL_COMMUNITY)
Admission: RE | Admit: 2022-04-18 | Discharge: 2022-04-18 | Disposition: A | Discharge status: Home / Self Care | Payer: Medicare Other | Source: Ambulatory Visit | Attending: Hematology & Oncology | Admitting: Hematology & Oncology

## 2022-04-18 ENCOUNTER — Ambulatory Visit (INDEPENDENT_AMBULATORY_CARE_PROVIDER_SITE_OTHER): Payer: Medicare Other | Admitting: Physician Assistant

## 2022-04-18 DIAGNOSIS — R918 Other nonspecific abnormal finding of lung field: Secondary | ICD-10-CM | POA: Diagnosis not present

## 2022-04-18 DIAGNOSIS — Z0000 Encounter for general adult medical examination without abnormal findings: Secondary | ICD-10-CM | POA: Diagnosis not present

## 2022-04-18 DIAGNOSIS — C3491 Malignant neoplasm of unspecified part of right bronchus or lung: Secondary | ICD-10-CM | POA: Insufficient documentation

## 2022-04-18 LAB — GLUCOSE, CAPILLARY: Glucose-Capillary: 74 mg/dL (ref 70–99)

## 2022-04-18 MED ORDER — FLUDEOXYGLUCOSE F - 18 (FDG) INJECTION
10.0000 | Freq: Once | INTRAVENOUS | Status: AC | PRN
  Administered 2022-04-18: 9.49 via INTRAVENOUS

## 2022-04-18 NOTE — Progress Notes (Signed)
MEDICARE ANNUAL WELLNESS VISIT  04/18/2022  Telephone Visit Disclaimer This Medicare AWV was conducted by telephone due to national recommendations for restrictions regarding the COVID-19 Pandemic (e.g. social distancing).  I verified, using two identifiers, that I am speaking with Walter Wilkerson or their authorized healthcare agent. I discussed the limitations, risks, security, and privacy concerns of performing an evaluation and management service by telephone and the potential availability of an in-person appointment in the future. The patient expressed understanding and agreed to proceed.  Location of Patient: Home Location of Provider (nurse):  In the office.  Subjective:    Walter Wilkerson is a 72 y.o. male patient of Jomarie Longs, PA-C who had a [employer] Visit today via telephone. Walter Wilkerson is Working part time and lives with their spouse and son. he has 3 children. he reports that he is socially active and does interact with friends/family regularly. he is moderately physically active and enjoys working outside and doing yardwork.  Patient Care Team: Nolene Ebbs as PCP - General (Family Medicine) Jodi Geralds, MD as Consulting Physician (Orthopedic Surgery) Josph Macho, MD as Medical Oncologist (Oncology) Gwendel Hanson, RN as Oncology Nurse Navigator     04/18/2022    8:10 AM 03/30/2022   11:11 AM 09/14/2021    1:43 PM 04/12/2021    8:05 AM 12/24/2018    8:06 AM 12/18/2017    8:09 AM 12/30/2016   10:24 AM  Advanced Directives  Does Patient Have a Medical Advance Directive? Yes Yes Yes Yes Yes Yes Yes  Type of Advance Directive Living will Healthcare Power of [place];Living will Healthcare Power of [place];Living will Living will Living will Healthcare Power of [place];Living will Living will  Does patient want to make changes to medical advance directive? No - Patient declined No - Patient declined  No - Patient declined No - Patient declined  No - Patient declined   Copy of Healthcare Power of Attorney in Chart? No - copy requested No - copy requested No - copy requested   No - copy requested     Hospital Utilization Over the Past 12 Months: # of hospitalizations or ER visits: 0 # of surgeries: 2  Review of Systems    Patient reports that his overall health is unchanged compared to last year.  History obtained from chart review and the patient  Patient Reported Readings (BP, Pulse, CBG, Weight, etc) none  Pain Assessment Pain : 0-10 Pain Score: 5  Pain Type: Acute pain Pain Location: Back Pain Orientation: Lower Pain Descriptors / Indicators: Aching Pain Onset: More than a month ago Pain Frequency: Intermittent Pain Relieving Factors: with movement  Pain Relieving Factors: with movement  Current Medications & Allergies (verified) Allergies as of 04/18/2022       Reactions   Pregabalin Other (See Comments)   Elevated LFTs   Lipitor [atorvastatin Calcium] Other (See Comments)   Myalgias.    Lisinopril Nausea Only   Merbromin Hives   Thimerosal (thiomersal) Hives        Medication List        Accurate as of April 18, 2022  8:17 AM. If you have any questions, ask your nurse or doctor.          albuterol 108 (90 Base) MCG/ACT inhaler Commonly known as: VENTOLIN HFA Inhale 2 puffs into the lungs every 4 (four) hours as needed.   amLODipine 5 MG tablet Commonly known as: NORVASC Take 1 tablet (5 mg total)  by mouth daily.   aspirin EC 81 MG tablet Take 81 mg by mouth daily.   atorvastatin 40 MG tablet Commonly known as: LIPITOR Take 1 tablet (40 mg total) by mouth daily.   celecoxib 200 MG capsule Commonly known as: CELEBREX Take 200 mg by mouth 2 (two) times daily.   diclofenac 75 MG EC tablet Commonly known as: VOLTAREN Take 1 tablet by mouth twice a day   docusate sodium 100 MG capsule Commonly known as: COLACE Take 100 mg by mouth 2 (two) times daily as needed.   DULoxetine 30 MG  capsule Commonly known as: CYMBALTA Take 1 capsule (30 mg total) by mouth daily.   famotidine 20 MG tablet Commonly known as: PEPCID Take 1 tablet (20 mg total) by mouth 2 (two) times daily.   hydrALAZINE 25 MG tablet Commonly known as: APRESOLINE Take 1 tablet (25 mg total) by mouth 2 (two) times daily.   levothyroxine 175 MCG tablet Commonly known as: SYNTHROID Take 1 tablet (175 mcg total) by mouth daily. Needs labs   multivitamin tablet Take 1 tablet by mouth daily.   Trelegy Ellipta 100-62.5-25 MCG/ACT Aepb Generic drug: Fluticasone-Umeclidin-Vilant Inhale 1 puff into the lungs daily in the afternoon.   valsartan-hydrochlorothiazide 320-25 MG tablet Commonly known as: DIOVAN-HCT Take 1 tablet by mouth daily.        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 Jodi Geralds   VASECTOMY     Family History  Problem Relation Age of Onset   Heart attack Father    Hyperlipidemia Father    Hypertension Father    Heart disease Father    Diabetes Other        grandmother    Social History   Socioeconomic History   Marital status: Married    Spouse name: [name]   Number of children: 3   Years of education: 12   Highest education level: 12th grade  Occupational History   Occupation: [employer]    Comment: part time  Tobacco Use   Smoking status: Former    Types: Cigarettes    Quit date: 08/10/2004    Years since quitting: 17.6   Smokeless tobacco: Never  Vaping Use   Vaping Use: Never used  Substance and Sexual Activity   Alcohol use: Yes    Alcohol/week: 21.0 standard drinks of alcohol    Types: 21 Shots of liquor per week    Comment: 1-2 shots every night   Drug use: No   Sexual activity: Not Currently    Partners: Female  Other  Topics Concern   Not on file  Social History Narrative   Lives with his wife and son. 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   Social Determinants of Health   Financial Resource Strain: Low Risk  (04/18/2022)   Overall Financial Resource Strain (CARDIA)    Difficulty of Paying Living Expenses: Not hard at all  Food Insecurity: No Food Insecurity (04/18/2022)   Hunger Vital Sign    Worried About Running Out of Food in the Last Year: Never true    Ran Out of Food in the Last Year: Never true  Transportation Needs: No Transportation Needs (04/18/2022)   PRAPARE - Transportation    Lack  of Transportation (Medical): No    Lack of Transportation (Non-Medical): No  Physical Activity: Inactive (04/18/2022)   Exercise Vital Sign    Days of Exercise per Week: 0 days    Minutes of Exercise per Session: 0 min  Stress: No Stress Concern Present (04/18/2022)   [employer] - Occupational Stress Questionnaire    Feeling of Stress : Not at all  Social Connections: Moderately Integrated (04/18/2022)   Social Connection and Isolation Panel [NHANES]    Frequency of Communication with Friends and Family: More than three times a week    Frequency of Social Gatherings with Friends and Family: More than three times a week    Attends Religious Services: More than 4 times per year    Active Member of [employer]ubs or Organizations: No    Attends BankerClub or Organization Meetings: Never    Marital Status: Married    Activities of Daily Living    04/18/2022    8:12 AM  In your present state of health, do you have any difficulty performing the following activities:  Hearing? 1  Comment some hearing loss  Vision? 0  Comment wears glasses  Difficulty concentrating or making decisions? 0  Walking or climbing stairs? 1  Comment since the knee surgery  Dressing or bathing? 0  Doing errands, shopping? 0  Preparing Food and eating ? N  Using the Toilet? N  In the past six months, have you  accidently leaked urine? N  Do you have problems with loss of bowel control? N  Managing your Medications? N  Managing your Finances? N  Housekeeping or managing your Housekeeping? N    Patient Education/ Literacy How often do you need to have someone help you when you read instructions, pamphlets, or other written materials from your doctor or pharmacy?: 1 - Never What is the last grade level you completed in school?: 12th grade  Exercise Current Exercise Habits: The patient does not participate in regular exercise at present, Exercise limited by: orthopedic condition(s)  Diet Patient reports consuming 3 meals a day and 2 snack(s) a day Patient reports that his primary diet is: Regular Patient reports that she does have regular access to food.   Depression Screen    04/18/2022    8:11 AM 03/30/2022    1:08 PM 03/08/2022    8:49 AM 01/11/2022    8:29 AM 08/27/2021    1:14 PM 04/14/2021    1:14 PM 04/12/2021    8:05 AM  PHQ 2/9 Scores  PHQ - 2 Score 0 0 0 0 0 0 0     Fall Risk    04/18/2022    8:10 AM 03/08/2022    8:49 AM 01/11/2022    8:28 AM 08/27/2021    1:14 PM 04/14/2021    1:14 PM  Fall Risk   Falls in the past year? 0 0 0 0 0  Number falls in past yr: 0 0 0 0 0  Injury with Fall? 0 0 0 0 0  Risk for fall due to : No Fall Risks No Fall Risks No Fall Risks No Fall Risks No Fall Risks  Follow up Falls evaluation completed Falls evaluation completed Falls evaluation completed Falls evaluation completed Falls prevention discussed;Falls evaluation completed     Objective:  Walter HazardWilliam Wilkerson seemed alert and oriented and he participated appropriately during our telephone visit.  Blood Pressure Weight BMI  BP Readings from Last 3 Encounters:  04/04/22 128/64  03/30/22 (!) 143/72  03/08/22 (!) 150/80   Wt Readings from Last 3 Encounters:  04/04/22 187 lb 11.6 oz (85.2 kg)  03/30/22 188 lb (85.3 kg)  03/08/22 186 lb (84.4 kg)   BMI Readings from Last 1 Encounters:  04/04/22 28.54  kg/m    *Unable to obtain current vital signs, weight, and BMI due to telephone visit type  Hearing/Vision  Walter Wilkerson did not seem to have difficulty with hearing/understanding during the telephone conversation Reports that he has had a formal eye exam by an eye care professional within the past year Reports that he has not had a formal hearing evaluation within the past year *Unable to fully assess hearing and vision during telephone visit type  Cognitive Function:    04/18/2022    8:14 AM 04/12/2021    8:10 AM 12/24/2018    8:11 AM 12/18/2017    8:14 AM  6CIT Screen  What Year? 0 points 0 points 0 points 0 points  What month? 0 points 0 points 0 points 0 points  What time? 0 points 0 points 0 points 0 points  Count back from 20 0 points 0 points 0 points 0 points  Months in reverse 0 points 0 points 0 points 0 points  Repeat phrase 0 points 0 points 0 points 0 points  Total Score 0 points 0 points 0 points 0 points   (Normal:0-7, Significant for Dysfunction: >8)  Normal Cognitive Function Screening: Yes   Immunization & Health Maintenance Record Immunization History  Administered Date(s) Administered   Fluad Quad(high Dose 65+) 12/03/2019, 10/30/2020   Influenza Split 09/27/2010, 12/06/2011, 01/09/2013   Influenza,inj,Quad PF,6+ Mos 01/09/2013, 11/11/2014, 10/16/2015, 11/04/2016, 08/29/2018   Influenza-Unspecified 09/04/2017   Janssen (J&J) SARS-COV-2 Vaccination 03/24/2019, 02/02/2020   Novel Infuenza-h1n1-09 12/24/2007   Pneumococcal Conjugate-13 04/13/2015   Pneumococcal Polysaccharide-23 11/04/2016   Tdap 09/22/2010, 10/30/2020   Zoster Recombinat (Shingrix) 11/26/2018, 06/11/2019    Health Maintenance  Topic Date Due   COVID-19 Vaccine (3 - 2023-24 season) 05/04/2022 (Originally 09/10/2021)   INFLUENZA VACCINE  08/11/2022   Medicare Annual Wellness (AWV)  04/18/2023   COLONOSCOPY (Pts 45-47yrs Insurance coverage will need to be confirmed)  05/19/2023   DTaP/Tdap/Td  (3 - Td or Tdap) 10/31/2030   Pneumonia Vaccine 69+ Years old  Completed   Hepatitis C Screening  Completed   Zoster Vaccines- Shingrix  Completed   HPV VACCINES  Aged Out       Assessment  This is a routine wellness examination for [employer].  Health Maintenance: Due or Overdue There are no preventive care reminders to display for this patient.   Walter Wilkerson does not need a referral for Community Assistance: Care Management:   no Social Work:    no Prescription Assistance:  no Nutrition/Diabetes Education:  no   Plan:  Personalized Goals  Goals Addressed               This Visit's Progress     Patient Stated (pt-stated)        04/18/2022 AWV Goal: Exercise for General Health  Patient will verbalize understanding of the benefits of increased physical activity: Exercising regularly is important. It will improve your overall fitness, flexibility, and endurance. Regular exercise also will improve your overall health. It can help you control your weight, reduce stress, and improve your bone density. Over the next year, patient will increase physical activity as tolerated with a goal of at least 150 minutes of moderate physical activity per week.  You can tell that  you are exercising at a moderate intensity if your heart starts beating faster and you start breathing faster but can still hold a conversation. Moderate-intensity exercise ideas include: Walking 1 mile (1.6 km) in about 15 minutes Biking Hiking Golfing Dancing Water aerobics Patient will verbalize understanding of everyday activities that increase physical activity by providing examples like the following: Yard work, such as: Insurance underwriter Gardening Washing windows or floors Patient will be able to explain general safety guidelines for exercising:  Before you start a new exercise program, talk with your health care  provider. Do not exercise so much that you hurt yourself, feel dizzy, or get very short of breath. Wear comfortable clothes and wear shoes with good support. Drink plenty of water while you exercise to prevent dehydration or heat stroke. Work out until your breathing and your heartbeat get faster.        Personalized Health Maintenance & Screening Recommendations  There are no preventive care reminders to display for this patient.  Lung Cancer Screening Recommended: no (Low Dose CT Chest recommended if Age 79-80 years, 30 pack-year currently smoking OR have quit w/in past 15 years) Hepatitis C Screening recommended: no HIV Screening recommended: no  Advanced Directives: Written information was not prepared per patient's request.  Referrals & Orders No orders of the defined types were placed in this encounter.   Follow-up Plan Follow-up with Jomarie Longs, PA-C as planned Medicare wellness visit in one year.  Patient will access AVS on my chart.   I have personally reviewed and noted the following in the patient's chart:   Medical and social history Use of alcohol, tobacco or illicit drugs  Current medications and supplements Functional ability and status Nutritional status Physical activity Advanced directives List of other physicians Hospitalizations, surgeries, and ER visits in previous 12 months Vitals Screenings to include cognitive, depression, and falls Referrals and appointments  In addition, I have reviewed and discussed with Walter Wilkerson certain preventive protocols, quality metrics, and best practice recommendations. A written personalized care plan for preventive services as well as general preventive health recommendations is available and can be mailed to the patient at his request.      Modesto Charon. RN BSN  04/18/2022

## 2022-04-18 NOTE — Patient Instructions (Signed)
MEDICARE ANNUAL WELLNESS VISIT Health Maintenance Summary and Written Plan of Care  [name] ,  Thank you for allowing me to perform your Medicare Annual Wellness Visit and for your ongoing commitment to your health.   Health Maintenance & Immunization History Health Maintenance  Topic Date Due   COVID-19 Vaccine (3 - 2023-24 season) 05/04/2022 (Originally 09/10/2021)   INFLUENZA VACCINE  08/11/2022   Medicare Annual Wellness (AWV)  04/18/2023   COLONOSCOPY (Pts 45-34yrs Insurance coverage will need to be confirmed)  05/19/2023   DTaP/Tdap/Td (3 - Td or Tdap) 10/31/2030   Pneumonia Vaccine 31+ Years old  Completed   Hepatitis C Screening  Completed   Zoster Vaccines- Shingrix  Completed   HPV VACCINES  Aged Out   Immunization History  Administered Date(s) Administered   Fluad Quad(high Dose 65+) 12/03/2019, 10/30/2020   Influenza Split 09/27/2010, 12/06/2011, 01/09/2013   Influenza,inj,Quad PF,6+ Mos 01/09/2013, 11/11/2014, 10/16/2015, 11/04/2016, 08/29/2018   Influenza-Unspecified 09/04/2017   Janssen (J&J) SARS-COV-2 Vaccination 03/24/2019, 02/02/2020   Novel Infuenza-h1n1-09 12/24/2007   Pneumococcal Conjugate-13 04/13/2015   Pneumococcal Polysaccharide-23 11/04/2016   Tdap 09/22/2010, 10/30/2020   Zoster Recombinat (Shingrix) 11/26/2018, 06/11/2019    These are the patient goals that we discussed:  Goals Addressed               This Visit's Progress     Patient Stated (pt-stated)        04/18/2022 AWV Goal: Exercise for General Health  Patient will verbalize understanding of the benefits of increased physical activity: Exercising regularly is important. It will improve your overall fitness, flexibility, and endurance. Regular exercise also will improve your overall health. It can help you control your weight, reduce stress, and improve your bone density. Over the next year, patient will increase physical activity as tolerated with a goal of at least 150 minutes  of moderate physical activity per week.  You can tell that you are exercising at a moderate intensity if your heart starts beating faster and you start breathing faster but can still hold a conversation. Moderate-intensity exercise ideas include: Walking 1 mile (1.6 km) in about 15 minutes Biking Hiking Golfing Dancing Water aerobics Patient will verbalize understanding of everyday activities that increase physical activity by providing examples like the following: Yard work, such as: Insurance underwriter Gardening Washing windows or floors Patient will be able to explain general safety guidelines for exercising:  Before you start a new exercise program, talk with your health care provider. Do not exercise so much that you hurt yourself, feel dizzy, or get very short of breath. Wear comfortable clothes and wear shoes with good support. Drink plenty of water while you exercise to prevent dehydration or heat stroke. Work out until your breathing and your heartbeat get faster.          This is a list of Health Maintenance Items that are overdue or due now: There are no preventive care reminders to display for this patient.    Orders/Referrals Placed Today: No orders of the defined types were placed in this encounter.  (Contact our referral department at [phone] if you have not spoken with someone about your referral appointment within the next 5 days)    Follow-up Plan Follow-up with Jomarie Longs, PA-C as planned Medicare wellness visit in one year.  Patient will access AVS on my chart.      Health Maintenance, Male Adopting a  healthy lifestyle and getting preventive care are important in promoting health and wellness. Ask your health care provider about: The right schedule for you to have regular tests and exams. Things you can do on your own to prevent diseases and keep yourself  healthy. What should I know about diet, weight, and exercise? Eat a healthy diet  Eat a diet that includes plenty of vegetables, fruits, low-fat dairy products, and lean protein. Do not eat a lot of foods that are high in solid fats, added sugars, or sodium. Maintain a healthy weight Body mass index (BMI) is a measurement that can be used to identify possible weight problems. It estimates body fat based on height and weight. Your health care provider can help determine your BMI and help you achieve or maintain a healthy weight. Get regular exercise Get regular exercise. This is one of the most important things you can do for your health. Most adults should: Exercise for at least 150 minutes each week. The exercise should increase your heart rate and make you sweat (moderate-intensity exercise). Do strengthening exercises at least twice a week. This is in addition to the moderate-intensity exercise. Spend less time sitting. Even light physical activity can be beneficial. Watch cholesterol and blood lipids Have your blood tested for lipids and cholesterol at 73 years of age, then have this test every 5 years. You may need to have your cholesterol levels checked more often if: Your lipid or cholesterol levels are high. You are older than 73 years of age. You are at high risk for heart disease. What should I know about cancer screening? Many types of cancers can be detected early and may often be prevented. Depending on your health history and family history, you may need to have cancer screening at various ages. This may include screening for: Colorectal cancer. Prostate cancer. Skin cancer. Lung cancer. What should I know about heart disease, diabetes, and high blood pressure? Blood pressure and heart disease High blood pressure causes heart disease and increases the risk of stroke. This is more likely to develop in people who have high blood pressure readings or are overweight. Talk with  your health care provider about your target blood pressure readings. Have your blood pressure checked: Every 3-5 years if you are 42-45 years of age. Every year if you are 43 years old or older. If you are between the ages of 26 and 59 and are a current or former smoker, ask your health care provider if you should have a one-time screening for abdominal aortic aneurysm (AAA). Diabetes Have regular diabetes screenings. This checks your fasting blood sugar level. Have the screening done: Once every three years after age 41 if you are at a normal weight and have a low risk for diabetes. More often and at a younger age if you are overweight or have a high risk for diabetes. What should I know about preventing infection? Hepatitis B If you have a higher risk for hepatitis B, you should be screened for this virus. Talk with your health care provider to find out if you are at risk for hepatitis B infection. Hepatitis C Blood testing is recommended for: Everyone born from [birth year] through 1966. Anyone with known risk factors for hepatitis C. Sexually transmitted infections (STIs) You should be screened each year for STIs, including gonorrhea and chlamydia, if: You are sexually active and are younger than 73 years of age. You are older than 73 years of age and your health care provider tells  you that you are at risk for this type of infection. Your sexual activity has changed since you were last screened, and you are at increased risk for chlamydia or gonorrhea. Ask your health care provider if you are at risk. Ask your health care provider about whether you are at high risk for HIV. Your health care provider may recommend a prescription medicine to help prevent HIV infection. If you choose to take medicine to prevent HIV, you should first get tested for HIV. You should then be tested every 3 months for as long as you are taking the medicine. Follow these instructions at home: Alcohol use Do not drink  alcohol if your health care provider tells you not to drink. If you drink alcohol: Limit how much you have to 0-2 drinks a day. Know how much alcohol is in your drink. In the [place] one drink equals one 12 oz bottle of beer (355 mL), one 5 oz glass of wine (148 mL), or one 1 oz glass of hard liquor (44 mL). Lifestyle Do not use any products that contain nicotine or tobacco. These products include cigarettes, chewing tobacco, and vaping devices, such as e-cigarettes. If you need help quitting, ask your health care provider. Do not use street drugs. Do not share needles. Ask your health care provider for help if you need support or information about quitting drugs. General instructions Schedule regular health, dental, and eye exams. Stay current with your vaccines. Tell your health care provider if: You often feel depressed. You have ever been abused or do not feel safe at home. Summary Adopting a healthy lifestyle and getting preventive care are important in promoting health and wellness. Follow your health care provider's instructions about healthy diet, exercising, and getting tested or screened for diseases. Follow your health care provider's instructions on monitoring your cholesterol and blood pressure. This information is not intended to replace advice given to you by your health care provider. Make sure you discuss any questions you have with your health care provider. Document Revised: 05/18/2020 Document Reviewed: 05/18/2020 Elsevier Patient Education  2023 [employer]

## 2022-04-19 ENCOUNTER — Encounter: Payer: Self-pay | Admitting: *Deleted

## 2022-04-19 NOTE — Progress Notes (Signed)
Medical records still not received. Called Dr Anthonette Legato office at [phone] and spoke to their staff. Due to records being from 2010 and earlier, requests need to go through medical records.   Spoke to medical records at [phone]. They stated they received both my requests for records and that they were "still working on the request". I expressed how important these records were and the need for timely response. They stated they would make note.   Oncology Nurse Navigator Documentation     04/19/2022   10:00 AM  Oncology Nurse Navigator Flowsheets  Navigator Location [facility]  Navigator Encounter Type Telephone  Telephone Outgoing Call  Patient Visit Type MedOnc  Treatment Phase Abnormal Scans  Barriers/Navigation Needs Coordination of Care;Education  Interventions Coordination of Care  Acuity Level 2-Minimal Needs (1-2 Barriers Identified)  Coordination of Care Other  Support Groups/Services Friends and Family  Time Spent with Patient 15

## 2022-04-20 ENCOUNTER — Ambulatory Visit: Payer: Medicare Other | Attending: Cardiology | Admitting: Cardiology

## 2022-04-20 ENCOUNTER — Ambulatory Visit: Payer: Medicare Other | Attending: Cardiology

## 2022-04-20 ENCOUNTER — Encounter: Payer: Self-pay | Admitting: Cardiology

## 2022-04-20 VITALS — BP 148/62 | HR 78 | Ht 68.0 in | Wt 189.0 lb

## 2022-04-20 DIAGNOSIS — R002 Palpitations: Secondary | ICD-10-CM

## 2022-04-20 DIAGNOSIS — I10 Essential (primary) hypertension: Secondary | ICD-10-CM

## 2022-04-20 DIAGNOSIS — E782 Mixed hyperlipidemia: Secondary | ICD-10-CM

## 2022-04-20 DIAGNOSIS — R7303 Prediabetes: Secondary | ICD-10-CM | POA: Diagnosis not present

## 2022-04-20 DIAGNOSIS — R0609 Other forms of dyspnea: Secondary | ICD-10-CM | POA: Diagnosis not present

## 2022-04-20 DIAGNOSIS — I7121 Aneurysm of the ascending aorta, without rupture: Secondary | ICD-10-CM | POA: Diagnosis not present

## 2022-04-20 DIAGNOSIS — I712 Thoracic aortic aneurysm, without rupture, unspecified: Secondary | ICD-10-CM | POA: Insufficient documentation

## 2022-04-20 DIAGNOSIS — I2510 Atherosclerotic heart disease of native coronary artery without angina pectoris: Secondary | ICD-10-CM

## 2022-04-20 NOTE — Progress Notes (Addendum)
Cardiology Consultation:    Date:  04/20/2022   ID:  [name], DOB 1950-10-18, MRN [id number]  PCP:  Walter Longs, PA-C  Cardiologist:  Gypsy Balsam, MD   Referring MD: Walter Wilkerson, [place]   Chief Complaint  Patient presents with   Aorta enlarged    History of Present Illness:    Walter Wilkerson is a 72 y.o. male who is being seen today for the evaluation of enlargement of the aorta at the request of Walter Wilkerson, Jade L, PA-C.  Past medical history significant for essential hypertension, history of smoking he quit years ago, lung cancer status post resection of the right lower lobe, thyroid problem recently he had CT of his chest to follow-up on his cancer.  He was found to have enlargement of the ascending aorta measuring 40 mm.  He was referred to [place] for evaluation.  Overall he complained of having shortness of breath.  He said before right knee replacement surgery that he had done just few months ago he was able to go to gym and walk on the regular basis now when he is trying to do it as he gets short of breath and he cannot continue walking.  Denies having any exertional chest pain, he did have a stress test done in August of last year which was negative for exercise-induced myocardial ischemia.  Describe also to have some palpitations what he mean by that is he will feel his heart speeding up that happen maybe once a month and that last for couple minutes and goes away without any sequela.  He will get some shortness of breath with this but no dizziness no sweating.  He now does not exercise on the regular basis not on any special diet he does have family history of premature coronary artery disease.  Past Medical History:  Diagnosis Date   Hypertension    Lung cancer    Thyroid disease     Past Surgical History:  Procedure Laterality Date   LUNG LOBECTOMY Right 2008   RLL resection   R knee arthroscopy     REPLACEMENT TOTAL KNEE Right 09/15/2021   Dr. Jodi Geralds    VASECTOMY      Current Medications: Current Meds  Medication Sig   albuterol (VENTOLIN HFA) 108 (90 Base) MCG/ACT inhaler Inhale 2 puffs into the lungs every 4 (four) hours as needed. (Patient taking differently: Inhale 2 puffs into the lungs every 4 (four) hours as needed for wheezing or shortness of breath.)   amLODipine (NORVASC) 5 MG tablet Take 1 tablet (5 mg total) by mouth daily.   aspirin EC 81 MG tablet Take 81 mg by mouth daily.   atorvastatin (LIPITOR) 40 MG tablet Take 1 tablet (40 mg total) by mouth daily.   celecoxib (CELEBREX) 200 MG capsule Take 200 mg by mouth 2 (two) times daily.   diclofenac (VOLTAREN) 75 MG EC tablet Take 1 tablet by mouth twice a day (Patient taking differently: Take 75 mg by mouth 2 (two) times daily.)   docusate sodium (COLACE) 100 MG capsule Take 100 mg by mouth 2 (two) times daily as needed for mild constipation or moderate constipation.   DULoxetine (CYMBALTA) 30 MG capsule Take 1 capsule (30 mg total) by mouth daily.   famotidine (PEPCID) 20 MG tablet Take 1 tablet (20 mg total) by mouth 2 (two) times daily.   Fluticasone-Umeclidin-Vilant (TRELEGY ELLIPTA) 100-62.5-25 MCG/ACT AEPB Inhale 1 puff into the lungs daily in the afternoon.  hydrALAZINE (APRESOLINE) 25 MG tablet Take 1 tablet (25 mg total) by mouth 2 (two) times daily.   levothyroxine (SYNTHROID) 175 MCG tablet Take 1 tablet (175 mcg total) by mouth daily. Needs labs   Multiple Vitamin (MULTIVITAMIN) tablet Take 1 tablet by mouth daily.   valsartan-hydrochlorothiazide (DIOVAN-HCT) 320-25 MG tablet Take 1 tablet by mouth daily.     Allergies:   Pregabalin, Lipitor [atorvastatin calcium], Lisinopril, Merbromin, and Thimerosal (thiomersal)   Social History   Socioeconomic History   Marital status: Married    Spouse name: [name]   Number of children: 3   Years of education: 12   Highest education level: 12th grade  Occupational History   Occupation: [employer]    Comment:  part time  Tobacco Use   Smoking status: Former    Types: Cigarettes    Quit date: 08/10/2004    Years since quitting: 17.7   Smokeless tobacco: Never  Vaping Use   Vaping Use: Never used  Substance and Sexual Activity   Alcohol use: Yes    Alcohol/week: 21.0 standard drinks of alcohol    Types: 21 Shots of liquor per week    Comment: 1-2 shots every night   Drug use: No   Sexual activity: Not Currently    Partners: Female  Other Topics Concern   Not on file  Social History Narrative   Lives with his wife and son. He enjoys working outside and doing Presenter, broadcastingyardwork.   Social Determinants of Health   Financial Resource Strain: Low Risk  (04/18/2022)   Overall Financial Resource Strain (CARDIA)    Difficulty of Paying Living Expenses: Not hard at all  Food Insecurity: No Food Insecurity (04/18/2022)   Hunger Vital Sign    Worried About Running Out of Food in the Last Year: Never true    Ran Out of Food in the Last Year: Never true  Transportation Needs: No Transportation Needs (04/18/2022)   PRAPARE - Administrator, Civil ServiceTransportation    Lack of Transportation (Medical): No    Lack of Transportation (Non-Medical): No  Physical Activity: Inactive (04/18/2022)   Exercise Vital Sign    Days of Exercise per Week: 0 days    Minutes of Exercise per Session: 0 min  Stress: No Stress Concern Present (04/18/2022)   [employer] - Occupational Stress Questionnaire    Feeling of Stress : Not at all  Social Connections: Moderately Integrated (04/18/2022)   Social Connection and Isolation Panel [NHANES]    Frequency of Communication with Friends and Family: More than three times a week    Frequency of Social Gatherings with Friends and Family: More than three times a week    Attends Religious Services: More than 4 times per year    Active Member of [employer] FinancialClubs or Organizations: No    Attends Engineer, structuralClub or Organization Meetings: Never    Marital Status: Married     Family History: The patient's family  history includes Diabetes in an other family member; Heart attack in his father; Heart disease in his father; Hyperlipidemia in his father; Hypertension in his father. ROS:   Please see the history of present illness.    All 14 point review of systems negative except as described per history of present illness.  EKGs/Labs/Other Studies Reviewed:    The following studies were reviewed today: CT of his chest reviewed, it revealed calcification of the coronary arteries as well as aorta.  EKG:  EKG is  ordered today.  The ekg ordered today  demonstrates normal sinus rhythm normal P interval normal QS complex duration fulgent no ST segment changes  Recent Labs: 01/11/2022: TSH 10.51 03/30/2022: ALT 32; BUN 28; Creatinine 1.35; Hemoglobin 11.2; Platelet Count 210; Potassium 4.9; Sodium 136  Recent Lipid Panel    Component Value Date/Time   CHOL 198 01/11/2022 0945   TRIG 61 01/11/2022 0945   HDL 127 01/11/2022 0945   CHOLHDL 1.6 01/11/2022 0945   VLDL 10 05/27/2016 0900   LDLCALC 57 01/11/2022 0945    Physical Exam:    VS:  BP (!) 148/62 (BP Location: Left Arm, Patient Position: Sitting)   Pulse 78   Ht 5\' 8"  (1.727 m)   Wt 189 lb (85.7 kg)   SpO2 99%   BMI 28.74 kg/m     Wt Readings from Last 3 Encounters:  04/20/22 189 lb (85.7 kg)  04/04/22 187 lb 11.6 oz (85.2 kg)  03/30/22 188 lb (85.3 kg)     GEN:  Well nourished, well developed in no acute distress HEENT: Normal NECK: No JVD; No carotid bruits LYMPHATICS: No lymphadenopathy CARDIAC: RRR, no murmurs, no rubs, no gallops RESPIRATORY:  Clear to auscultation without rales, wheezing or rhonchi  ABDOMEN: Soft, non-tender, non-distended MUSCULOSKELETAL: 1+ swelling lower extremities; No deformity  SKIN: Warm and dry NEUROLOGIC:  Alert and oriented x 3 PSYCHIATRIC:  Normal affect   ASSESSMENT:    1. Aneurysm of ascending aorta without rupture   2. Coronary artery disease involving native coronary artery of native heart  without angina pectoris   3. Essential hypertension   4. Prediabetes   5. Mixed hyperlipidemia    PLAN:    In order of problems listed above:  Aneurysm of the ascending aorta without rupture.  I will continue following he will have to have yearly assessment.  He is asymptomatic the key is trying to reduce his blood pressure to prevent aneurysm from groin also he need to be evaluated for aneurysm and different locations therefore I will do ultrasounds of his abdomen to rule out aneurysm there. Coronary artery disease he had atypical symptoms, he did have stress test done at [facility] which was negative.  Will continue evaluation that he may required some different testing additional testing to to clarify what chest pain is coming from.  In the meantime I will ask him to continue aspirin every single day. Essential hypertension blood pressure elevated.  Will increase his hydralazine to 37.5 mg 3 times daily. Mixed dyslipidemia he is on statin and red I did review K PN which show me HDL 127 LDL 57.  Excellent cholesterol profile. Palpitations we will put monitor on him for 2 weeks to see if he had any significant arrhythmia. Obstructive sleep apnea recently diagnosed awaiting CPAP mask. Swelling of lower extremities again echocardiogram will be done to rule out congestive heart failure   Medication Adjustments/Labs and Tests Ordered: Current medicines are reviewed at length with the patient today.  Concerns regarding medicines are outlined above.  No orders of the defined types were placed in this encounter.  No orders of the defined types were placed in this encounter.   Signed, Georgeanna Lea, MD, [facility]. 04/20/2022 10:18 AM    [facility]

## 2022-04-20 NOTE — Addendum Note (Signed)
Addended by: Baldo Ash D on: 04/20/2022 10:43 AM   Modules accepted: Orders

## 2022-04-20 NOTE — Patient Instructions (Addendum)
Medication Instructions:  Your physician recommends that you continue on your current medications as directed. Please refer to the Current Medication list given to you today.  *If you need a refill on your cardiac medications before your next appointment, please call your pharmacy*   Lab Work: None Ordered If you have labs (blood work) drawn today and your tests are completely normal, you will receive your results only by: MyChart Message (if you have MyChart) OR A paper copy in the mail If you have any lab test that is abnormal or we need to change your treatment, we will call you to review the results.   Testing/Procedures: Your physician has requested that you have an abdominal aorta duplex. During this test, an ultrasound is used to evaluate the aorta. Allow 30 minutes for this exam. Do not eat after midnight the day before and avoid carbonated beverages   Your physician has requested that you have an echocardiogram. Echocardiography is a painless test that uses sound waves to create images of your heart. It provides your doctor with information about the size and shape of your heart and how well your heart's chambers and valves are working. This procedure takes approximately one hour. There are no restrictions for this procedure. Please do NOT wear cologne, perfume, aftershave, or lotions (deodorant is allowed). Please arrive 15 minutes prior to your appointment time.    WHY IS MY DOCTOR PRESCRIBING ZIO? The Zio system is proven and trusted by physicians to detect and diagnose irregular heart rhythms -- and has been prescribed to hundreds of thousands of patients.  The FDA has cleared the Zio system to monitor for many different kinds of irregular heart rhythms. In a study, physicians were able to reach a diagnosis 90% of the time with the Zio system1.  You can wear the Zio monitor -- a small, discreet, comfortable patch -- during your normal day-to-day activity, including while you  sleep, shower, and exercise, while it records every single heartbeat for analysis.  1Barrett, P., et al. Comparison of 24 Hour Holter Monitoring Versus 14 Day Novel Adhesive Patch Electrocardiographic Monitoring. American Journal of Medicine, 2014.  ZIO VS. HOLTER MONITORING The Zio monitor can be comfortably worn for up to 14 days. Holter monitors can be worn for 24 to 48 hours, limiting the time to record any irregular heart rhythms you may have. Zio is able to capture data for the 51% of patients who have their first symptom-triggered arrhythmia after 48 hours.1  LIVE WITHOUT RESTRICTIONS The Zio ambulatory cardiac monitor is a small, unobtrusive, and water-resistant patch--you might even forget you're wearing it. The Zio monitor records and stores every beat of your heart, whether you're sleeping, working out, or showering.     Follow-Up: At [facility], you and your health needs are our priority.  As part of our continuing mission to provide you with exceptional heart care, we have created designated Provider Care Teams.  These Care Teams include your primary Cardiologist (physician) and Advanced Practice Providers (APPs -  Physician Assistants and Nurse Practitioners) who all work together to provide you with the care you need, when you need it.  We recommend signing up for the patient portal called "MyChart".  Sign up information is provided on this After Visit Summary.  MyChart is used to connect with patients for Virtual Visits (Telemedicine).  Patients are able to view lab/test results, encounter notes, upcoming appointments, etc.  Non-urgent messages can be sent to your provider as well.   To  learn more about what you can do with MyChart, go to [email or web address].    Your next appointment:   3 week(s)  The format for your next appointment:   In Person  Provider:   Gypsy Balsam, MD    Other Instructions NA

## 2022-04-21 ENCOUNTER — Other Ambulatory Visit: Payer: Self-pay | Admitting: Physician Assistant

## 2022-04-21 DIAGNOSIS — M130 Polyarthritis, unspecified: Secondary | ICD-10-CM

## 2022-04-22 ENCOUNTER — Encounter: Payer: Self-pay | Admitting: *Deleted

## 2022-04-22 NOTE — Progress Notes (Unsigned)
Reviewed PET with Dr Myna Hidalgo. There is no additional action needed at this time, but he will repeat imaging in about 3 months. He would like patient scheduled to come in for discussion. Message sent to scheduling.   Oncology Nurse Navigator Documentation     04/22/2022    1:30 PM  Oncology Nurse Navigator Flowsheets  Navigator Follow Up Date: 05/13/2022  Navigator Follow Up Reason: Follow-up Appointment  Navigator Location [facility]  Navigator Encounter Type Diagnostic Results;Telephone  Telephone Diagnostic Results;Outgoing Call  Patient Visit Type MedOnc  Treatment Phase Abnormal Scans  Barriers/Navigation Needs Coordination of Care;Education  Education Other  Interventions Education;Coordination of Care  Acuity Level 2-Minimal Needs (1-2 Barriers Identified)  Coordination of Care Appts  Education Method Verbal  Support Groups/Services Friends and Family  Time Spent with Patient 15

## 2022-04-25 ENCOUNTER — Other Ambulatory Visit: Payer: Self-pay | Admitting: Physician Assistant

## 2022-04-26 ENCOUNTER — Other Ambulatory Visit: Payer: Self-pay

## 2022-04-26 ENCOUNTER — Telehealth: Payer: Self-pay

## 2022-04-26 DIAGNOSIS — E039 Hypothyroidism, unspecified: Secondary | ICD-10-CM

## 2022-04-26 DIAGNOSIS — M130 Polyarthritis, unspecified: Secondary | ICD-10-CM

## 2022-04-26 LAB — TSH: TSH: 3.88 mIU/L (ref 0.40–4.50)

## 2022-04-26 MED ORDER — LEVOTHYROXINE SODIUM 175 MCG PO TABS: 175.0000 ug | ORAL_TABLET | Freq: Every day | ORAL | 0 refills | Status: DC

## 2022-04-26 MED ORDER — PREDNISONE 1 MG PO TABS: 1.0000 mg | ORAL_TABLET | Freq: Every day | ORAL | 1 refills | Status: DC

## 2022-04-26 NOTE — Telephone Encounter (Signed)
Called patient and left a detailed voice mail message on patient home # .

## 2022-04-26 NOTE — Telephone Encounter (Signed)
Patient called.  He is in need of refill of prednisone 1mg  This medication not showing in patient current medication list  Patient states he is continuing to take the medication and has for a long time . Does show in past medication list as discontinued.   He is also in need of refill of levothyroxine but he is aware that needs labwork and orders placed today and he will stop by the office to have this drawn.

## 2022-04-27 ENCOUNTER — Other Ambulatory Visit: Payer: Self-pay | Admitting: Physician Assistant

## 2022-04-27 MED ORDER — LEVOTHYROXINE SODIUM 175 MCG PO TABS: 175.0000 ug | ORAL_TABLET | Freq: Every day | ORAL | 1 refills | Status: DC

## 2022-04-27 NOTE — Progress Notes (Signed)
TSH is much better! Sent refill.

## 2022-04-28 ENCOUNTER — Ambulatory Visit (INDEPENDENT_AMBULATORY_CARE_PROVIDER_SITE_OTHER): Payer: Medicare Other | Admitting: Sports Medicine

## 2022-04-28 ENCOUNTER — Encounter: Payer: Self-pay | Admitting: Sports Medicine

## 2022-04-28 ENCOUNTER — Ambulatory Visit (INDEPENDENT_AMBULATORY_CARE_PROVIDER_SITE_OTHER): Payer: Medicare Other

## 2022-04-28 DIAGNOSIS — M47816 Spondylosis without myelopathy or radiculopathy, lumbar region: Secondary | ICD-10-CM

## 2022-04-28 DIAGNOSIS — M545 Low back pain, unspecified: Secondary | ICD-10-CM | POA: Diagnosis not present

## 2022-04-28 MED ORDER — GABAPENTIN 300 MG PO CAPS
ORAL_CAPSULE | ORAL | 3 refills | Status: DC
Start: 2022-04-28 — End: 2022-05-20

## 2022-04-28 MED ORDER — IBUPROFEN 800 MG PO TABS
800.0000 mg | ORAL_TABLET | Freq: Three times a day (TID) | ORAL | 2 refills | Status: DC | PRN
Start: 2022-04-28 — End: 2022-08-05

## 2022-04-28 MED ORDER — PREDNISONE 50 MG PO TABS
ORAL_TABLET | ORAL | 0 refills | Status: DC
Start: 2022-04-28 — End: 2022-05-05

## 2022-04-28 NOTE — Progress Notes (Signed)
    Procedures performed today:    None.  Independent interpretation of notes and tests performed by another provider:   None.  Brief History, Exam, Impression, and Recommendations:    Lumbar spondylosis Walter Wilkerson returns, he is a very pleasant 72 year old male, he has known degenerative disc disease and spinal stenosis seen by me on a CT scan. He is having recurrence of pain for the past 2 weeks, he does work at the [employer] and [employer] which is particularly painful. Pain is mostly axial and discogenic. Pain is predominantly bilateral lower lumbar spine. On exam he is neurovascular intact distally with good motion, good strength and good sensation. Adding 5 days of prednisone followed by ibuprofen 800 3 times daily as needed, gabapentin at night, x-rays and home physical therapy, return to see me in 6 weeks, MR for interventional planning if no better. I have also put him on some light duty with restrictions at work.    ____________________________________________ Ihor Austin. Benjamin Stain, M.D., ABFM., CAQSM., AME. Primary Care and Sports Medicine [facility]  Adjunct Professor of Family Medicine  [employer] [facility] [employer]  Restaurant manager, fast food

## 2022-04-28 NOTE — Assessment & Plan Note (Signed)
Walter Wilkerson returns, he is a very pleasant 72 year old male, he has known degenerative disc disease and spinal stenosis seen by me on a CT scan. He is having recurrence of pain for the past 2 weeks, he does work at the [employer] and [employer] which is particularly painful. Pain is mostly axial and discogenic. Pain is predominantly bilateral lower lumbar spine. On exam he is neurovascular intact distally with good motion, good strength and good sensation. Adding 5 days of prednisone followed by ibuprofen 800 3 times daily as needed, gabapentin at night, x-rays and home physical therapy, return to see me in 6 weeks, MR for interventional planning if no better. I have also put him on some light duty with restrictions at work.

## 2022-05-01 ENCOUNTER — Other Ambulatory Visit: Payer: Self-pay | Admitting: Physician Assistant

## 2022-05-01 DIAGNOSIS — I10 Essential (primary) hypertension: Secondary | ICD-10-CM

## 2022-05-05 ENCOUNTER — Ambulatory Visit (INDEPENDENT_AMBULATORY_CARE_PROVIDER_SITE_OTHER): Payer: Medicare Other | Admitting: Pulmonary Disease

## 2022-05-05 ENCOUNTER — Ambulatory Visit (HOSPITAL_BASED_OUTPATIENT_CLINIC_OR_DEPARTMENT_OTHER): Payer: Medicare Other | Admitting: Pulmonary Disease

## 2022-05-05 ENCOUNTER — Encounter (HOSPITAL_BASED_OUTPATIENT_CLINIC_OR_DEPARTMENT_OTHER): Payer: Self-pay | Admitting: Pulmonary Disease

## 2022-05-05 VITALS — BP 150/70 | HR 79 | Ht 68.0 in | Wt 192.6 lb

## 2022-05-05 DIAGNOSIS — J432 Centrilobular emphysema: Secondary | ICD-10-CM | POA: Diagnosis not present

## 2022-05-05 DIAGNOSIS — R911 Solitary pulmonary nodule: Secondary | ICD-10-CM

## 2022-05-05 DIAGNOSIS — G4733 Obstructive sleep apnea (adult) (pediatric): Secondary | ICD-10-CM | POA: Diagnosis not present

## 2022-05-05 LAB — PULMONARY FUNCTION TEST
DL/VA % pred: 110 %
DL/VA: 4.46 ml/min/mmHg/L
DLCO cor % pred: 73 %
DLCO cor: 17.75 ml/min/mmHg
DLCO unc % pred: 65 %
DLCO unc: 15.78 ml/min/mmHg
FEF 25-75 Post: 1.53 L/sec
FEF 25-75 Pre: 1.19 L/sec
FEF2575-%Change-Post: 28 %
FEF2575-%Pred-Post: 69 %
FEF2575-%Pred-Pre: 54 %
FEV1-%Change-Post: 5 %
FEV1-%Pred-Post: 67 %
FEV1-%Pred-Pre: 64 %
FEV1-Post: 1.99 L
FEV1-Pre: 1.89 L
FEV1FVC-%Change-Post: 1 %
FEV1FVC-%Pred-Pre: 98 %
FEV6-%Change-Post: 3 %
FEV6-%Pred-Post: 71 %
FEV6-%Pred-Pre: 69 %
FEV6-Post: 2.69 L
FEV6-Pre: 2.61 L
FEV6FVC-%Change-Post: 0 %
FEV6FVC-%Pred-Post: 105 %
FEV6FVC-%Pred-Pre: 105 %
FVC-%Change-Post: 3 %
FVC-%Pred-Post: 67 %
FVC-%Pred-Pre: 65 %
FVC-Post: 2.72 L
FVC-Pre: 2.63 L
Post FEV1/FVC ratio: 73 %
Post FEV6/FVC ratio: 99 %
Pre FEV1/FVC ratio: 72 %
Pre FEV6/FVC Ratio: 99 %
RV % pred: 88 %
RV: 2.11 L
TLC % pred: 75 %
TLC: 5.03 L

## 2022-05-05 NOTE — Patient Instructions (Signed)
Will arrange for CPAP set up ? ?Follow up in 4 months ?

## 2022-05-05 NOTE — Patient Instructions (Signed)
Full PFT Performed Today  

## 2022-05-05 NOTE — Progress Notes (Signed)
[facility]  Chief Complaint  Patient presents with   Follow-up    HST results 3*31*24 PFT results    Past Surgical History:  He  has a past surgical history that includes Vasectomy; Lung lobectomy (Right, 2008); Replacement total knee (Right, 09/15/2021); and R knee arthroscopy.  Past Medical History:  HTN, Lung cancer s/p Rt lower lobectomy, Hypothyroidism  Constitutional:  BP (!) 150/70 (BP Location: Right Arm, Cuff Size: Large)   Pulse 79   Ht  (1.727 m)   Wt 192 lb 9.6 oz (87.4 kg)   SpO2 97%   BMI 29.28 kg/m   Brief Summary:  Walter Wilkerson is a 72 y.o. male forme smoker with emphysema, lung nodule and obstructive sleep apnea.      Subjective:   HST showed severe sleep apnea.  PFT showed mild restriction and diffusion defects.  PET scan didn't show significant uptake.  Physical Exam:   Appearance - well kempt   ENMT - no sinus tenderness, no oral exudate, no LAN, Mallampati 3 airway, no stridor, raspy voice  Respiratory - equal breath sounds bilaterally, no wheezing or rales  CV - s1s2 regular rate and rhythm, no murmurs  Ext - no clubbing, no edema  Skin - no rashes  Psych - normal mood and affect      Pulmonary testing:  PFT 05/05/22 >> FEV1 2.72 (67%), FEV1% 73, TLC 5.03 (75%), DLCO 65%  Chest Imaging:  CT chest 03/23/22 >> atherosclerosis, ascending aorta 4 cm, calcified subcarinal and hilar LN, centrilobular and paraseptal emphysema, 1.1 x 0.5 cm nodule LUL (was 5 mm on 02/07/22), granuloma LLL, new 4 mm nodule LLL  Sleep Tests:  HST 04/10/22 >> AHI 36.6, SpO2 low 76%. Spent 214.4 min with SpO2 < 89%.   Cardiac Tests:    Social History:  He  reports that he quit smoking about 17 years ago. His smoking use included cigarettes. He has never used smokeless tobacco. He reports current alcohol use of about 21.0 standard drinks of alcohol per week. He reports that he does not use drugs.  Family  History:  His family history includes Diabetes in an other family member; Heart attack in his father; Heart disease in his father; Hyperlipidemia in his father; Hypertension in his father.     Assessment/Plan:   Centrilobular emphysema with history of tobacco abuse and progressive dyspnea. - changes on PFT likely related to prior lung surgery, but could still have some degree of COPD - improved with transition to trelegy - continue trelegy 100 one puff daily - prn albuterol likely has COPD  Lt upper lobe lung nodule. - reviewed his PET scan - he has appointment with Dr. Myna Hidalgo in May  Obstructive sleep apnea. - reviewed his sleep study - discussed how sleep apnea can impact his health - treatment options discussed - will arrange for auto CPAP set up  CAD, Aortic dilation, Hypertension. - followed by Dr. Gypsy Balsam with cardiology - Echo pending   Time Spent Involved in Patient Care on Day of Examination:  37 minutes  Follow up:   Patient Instructions  Will arrange for CPAP set up  Follow up in 4 months  Medication List:   Allergies as of 05/05/2022       Reactions   Pregabalin Other (See Comments)   Elevated LFTs   Lipitor [atorvastatin Calcium] Other (See Comments)   Myalgias.    Lisinopril Nausea Only   Merbromin Hives  Thimerosal (thiomersal) Hives        Medication List        Accurate as of May 05, 2022 11:54 AM. If you have any questions, ask your nurse or doctor.          albuterol 108 (90 Base) MCG/ACT inhaler Commonly known as: VENTOLIN HFA Inhale 2 puffs into the lungs every 4 (four) hours as needed. What changed: reasons to take this   amLODipine 5 MG tablet Commonly known as: NORVASC Take 1 tablet (5 mg total) by mouth daily.   aspirin EC 81 MG tablet Take 81 mg by mouth daily.   atorvastatin 40 MG tablet Commonly known as: LIPITOR Take 1 tablet (40 mg total) by mouth daily.   celecoxib 200 MG capsule Commonly  known as: CELEBREX Take 200 mg by mouth 2 (two) times daily.   docusate sodium 100 MG capsule Commonly known as: COLACE Take 100 mg by mouth 2 (two) times daily as needed for mild constipation or moderate constipation.   DULoxetine 30 MG capsule Commonly known as: CYMBALTA Take 1 capsule (30 mg total) by mouth daily.   famotidine 20 MG tablet Commonly known as: PEPCID Take 1 tablet (20 mg total) by mouth 2 (two) times daily.   gabapentin 300 MG capsule Commonly known as: NEURONTIN One tab PO qHS for a week, then BID for a week, then TID. May double weekly to a max of 3,600mg /day   hydrALAZINE 25 MG tablet Commonly known as: APRESOLINE Take 1 tablet (25 mg total) by mouth 2 (two) times daily.   ibuprofen 800 MG tablet Commonly known as: ADVIL Take 1 tablet (800 mg total) by mouth every 8 (eight) hours as needed.   levothyroxine 175 MCG tablet Commonly known as: SYNTHROID Take 1 tablet (175 mcg total) by mouth daily before breakfast.   multivitamin tablet Take 1 tablet by mouth daily.   predniSONE 1 MG tablet Commonly known as: DELTASONE Take 1 tablet (1 mg total) by mouth daily with breakfast.   predniSONE 50 MG tablet Commonly known as: DELTASONE One tab PO daily for 5 days.   Trelegy Ellipta 100-62.5-25 MCG/ACT Aepb Generic drug: Fluticasone-Umeclidin-Vilant Inhale 1 puff into the lungs daily in the afternoon.   valsartan-hydrochlorothiazide 320-25 MG tablet Commonly known as: DIOVAN-HCT Take 1 tablet by mouth daily.        Signature:  Coralyn Helling, MD [facility] Pulmonary/Critical Care Pager - [phone] 05/05/2022, 11:54 AM

## 2022-05-05 NOTE — Progress Notes (Signed)
Full PFT Performed Today  

## 2022-05-06 ENCOUNTER — Ambulatory Visit (HOSPITAL_COMMUNITY)
Admission: RE | Admit: 2022-05-06 | Discharge: 2022-05-06 | Disposition: A | Discharge status: Home / Self Care | Payer: Medicare Other | Source: Ambulatory Visit | Attending: Cardiology | Admitting: Cardiology

## 2022-05-06 DIAGNOSIS — I7121 Aneurysm of the ascending aorta, without rupture: Secondary | ICD-10-CM

## 2022-05-07 ENCOUNTER — Other Ambulatory Visit: Payer: Self-pay | Admitting: Physician Assistant

## 2022-05-07 DIAGNOSIS — I10 Essential (primary) hypertension: Secondary | ICD-10-CM

## 2022-05-11 ENCOUNTER — Ambulatory Visit (HOSPITAL_BASED_OUTPATIENT_CLINIC_OR_DEPARTMENT_OTHER)
Admission: RE | Admit: 2022-05-11 | Discharge: 2022-05-11 | Disposition: A | Discharge status: Home / Self Care | Payer: Medicare Other | Source: Ambulatory Visit | Attending: Cardiology | Admitting: Cardiology

## 2022-05-11 DIAGNOSIS — R0609 Other forms of dyspnea: Secondary | ICD-10-CM | POA: Insufficient documentation

## 2022-05-11 DIAGNOSIS — I7121 Aneurysm of the ascending aorta, without rupture: Secondary | ICD-10-CM

## 2022-05-11 LAB — ECHOCARDIOGRAM COMPLETE
Area-P 1/2: 3.05 cm2
S' Lateral: 2.7 cm

## 2022-05-12 ENCOUNTER — Other Ambulatory Visit: Payer: Self-pay

## 2022-05-12 DIAGNOSIS — C3491 Malignant neoplasm of unspecified part of right bronchus or lung: Secondary | ICD-10-CM

## 2022-05-13 ENCOUNTER — Inpatient Hospital Stay: Payer: Medicare Other | Attending: Hematology & Oncology

## 2022-05-13 ENCOUNTER — Encounter: Payer: Self-pay | Admitting: Hematology & Oncology

## 2022-05-13 ENCOUNTER — Other Ambulatory Visit: Payer: Self-pay | Admitting: Physician Assistant

## 2022-05-13 ENCOUNTER — Inpatient Hospital Stay: Payer: Medicare Other | Admitting: Hematology & Oncology

## 2022-05-13 VITALS — BP 143/60 | HR 76 | Temp 98.6°F | Resp 20 | Ht 68.0 in | Wt 189.1 lb

## 2022-05-13 DIAGNOSIS — R918 Other nonspecific abnormal finding of lung field: Secondary | ICD-10-CM | POA: Insufficient documentation

## 2022-05-13 DIAGNOSIS — G8929 Other chronic pain: Secondary | ICD-10-CM | POA: Diagnosis not present

## 2022-05-13 DIAGNOSIS — D631 Anemia in chronic kidney disease: Secondary | ICD-10-CM

## 2022-05-13 DIAGNOSIS — M545 Low back pain, unspecified: Secondary | ICD-10-CM | POA: Diagnosis not present

## 2022-05-13 DIAGNOSIS — D649 Anemia, unspecified: Secondary | ICD-10-CM | POA: Diagnosis not present

## 2022-05-13 DIAGNOSIS — Z87891 Personal history of nicotine dependence: Secondary | ICD-10-CM | POA: Insufficient documentation

## 2022-05-13 DIAGNOSIS — N183 Chronic kidney disease, stage 3 unspecified: Secondary | ICD-10-CM | POA: Diagnosis not present

## 2022-05-13 DIAGNOSIS — C3491 Malignant neoplasm of unspecified part of right bronchus or lung: Secondary | ICD-10-CM

## 2022-05-13 DIAGNOSIS — Z85118 Personal history of other malignant neoplasm of bronchus and lung: Secondary | ICD-10-CM | POA: Diagnosis not present

## 2022-05-13 LAB — CMP (CANCER CENTER ONLY)
ALT: 12 U/L (ref 0–44)
AST: 14 U/L — ABNORMAL LOW (ref 15–41)
Albumin: 3.8 g/dL (ref 3.5–5.0)
Alkaline Phosphatase: 72 U/L (ref 38–126)
Anion gap: 6 (ref 5–15)
BUN: 24 mg/dL — ABNORMAL HIGH (ref 8–23)
CO2: 26 mmol/L (ref 22–32)
Calcium: 9.5 mg/dL (ref 8.9–10.3)
Chloride: 105 mmol/L (ref 98–111)
Creatinine: 1.29 mg/dL — ABNORMAL HIGH (ref 0.61–1.24)
GFR, Estimated: 59 mL/min — ABNORMAL LOW (ref >60)
Glucose, Bld: 115 mg/dL — ABNORMAL HIGH (ref 70–99)
Potassium: 4.6 mmol/L (ref 3.5–5.1)
Sodium: 137 mmol/L (ref 135–145)
Total Bilirubin: 0.4 mg/dL (ref 0.3–1.2)
Total Protein: 7.1 g/dL (ref 6.5–8.1)

## 2022-05-13 LAB — CBC WITH DIFFERENTIAL (CANCER CENTER ONLY)
Abs Immature Granulocytes: 0.02 10*3/uL (ref 0.00–0.07)
Basophils Absolute: 0.1 10*3/uL (ref 0.0–0.1)
Basophils Relative: 2 %
Eosinophils Absolute: 0.2 10*3/uL (ref 0.0–0.5)
Eosinophils Relative: 2 %
HCT: 31.7 % — ABNORMAL LOW (ref 39.0–52.0)
Hemoglobin: 10.7 g/dL — ABNORMAL LOW (ref 13.0–17.0)
Immature Granulocytes: 0 %
Lymphocytes Relative: 18 %
Lymphs Abs: 1.3 10*3/uL (ref 0.7–4.0)
MCH: 31.8 pg (ref 26.0–34.0)
MCHC: 33.8 g/dL (ref 30.0–36.0)
MCV: 94.1 fL (ref 80.0–100.0)
Monocytes Absolute: 0.7 10*3/uL (ref 0.1–1.0)
Monocytes Relative: 10 %
Neutro Abs: 5.1 10*3/uL (ref 1.7–7.7)
Neutrophils Relative %: 68 %
Platelet Count: 214 10*3/uL (ref 150–400)
RBC: 3.37 MIL/uL — ABNORMAL LOW (ref 4.22–5.81)
RDW: 16.9 % — ABNORMAL HIGH (ref 11.5–15.5)
WBC Count: 7.4 10*3/uL (ref 4.0–10.5)
nRBC: 0 % (ref 0.0–0.2)

## 2022-05-13 LAB — LACTATE DEHYDROGENASE: LDH: 162 U/L (ref 98–192)

## 2022-05-13 LAB — CEA (ACCESS): CEA (CHCC): 3.42 ng/mL (ref 0.00–5.00)

## 2022-05-13 NOTE — Progress Notes (Signed)
Hematology and Oncology Follow Up Visit  Walter Wilkerson [id number] 1950-10-18 72 y.o. 05/13/2022   Principle Diagnosis:  Multiple pulmonary nodules -history of tobacco use Normochromic normocytic anemia  Current Therapy:   Observation     Interim History:  Walter Wilkerson is back for his second office visit.  We first saw him back in March.  At that time, he came in with pulmonary nodules.  He, in the past, had a past history of stage II bronchogenic carcinoma.  At the time, I felt that we could follow him along and get a PET scan.  We did get a PET scan.  This was done on 04/18/2022.  The PET scan basically showed the left upper lobe nodule measuring 1.1 x 0.5 cm.  It had no obvious uptake.  There was a 1 cm nodule in the lingula which had very low uptake.  There is no obvious mediastinal or hilar adenopathy.  As such, is hard to say if there is any malignancy.  I think that we just repeat his scan in another couple months.  I noted today, that his hemoglobin is dropping.  I am unsure as to why it would be dropping.  We can have to watch this also.  He does have some chronic low back pain.  When we first saw him, we did do x-rays.  This did not show any evidence of malignancy.  He had degenerative changes.  He feels good.  He has no problems with nausea or vomiting.  He has had no change in bowel or bladder habits.  He has no weight loss or weight gain.  He has had no rashes.  There is been no bleeding.  Overall, I was his performance status is probably ECOG 1.  Medications:  Current Outpatient Medications:    albuterol (VENTOLIN HFA) 108 (90 Base) MCG/ACT inhaler, Inhale 2 puffs into the lungs every 4 (four) hours as needed. (Patient taking differently: Inhale 2 puffs into the lungs every 4 (four) hours as needed for wheezing or shortness of breath.), Disp: 6.7 g, Rfl: 0   amLODipine (NORVASC) 5 MG tablet, Take 1 tablet (5 mg total) by mouth daily., Disp: 90 tablet, Rfl: 0   aspirin EC 81  MG tablet, Take 81 mg by mouth daily., Disp: , Rfl:    atorvastatin (LIPITOR) 40 MG tablet, Take 1 tablet (40 mg total) by mouth daily., Disp: 90 tablet, Rfl: 3   celecoxib (CELEBREX) 200 MG capsule, Take 200 mg by mouth 2 (two) times daily., Disp: , Rfl:    DULoxetine (CYMBALTA) 30 MG capsule, Take 1 capsule (30 mg total) by mouth daily., Disp: 90 capsule, Rfl: 1   famotidine (PEPCID) 20 MG tablet, Take 1 tablet (20 mg total) by mouth 2 (two) times daily., Disp: 180 tablet, Rfl: 3   Fluticasone-Umeclidin-Vilant (TRELEGY ELLIPTA) 100-62.5-25 MCG/ACT AEPB, Inhale 1 puff into the lungs daily in the afternoon., Disp: 60 each, Rfl: 5   gabapentin (NEURONTIN) 300 MG capsule, One tab PO qHS for a week, then BID for a week, then TID. May double weekly to a max of 3,600mg /day (Patient taking differently: Take 300 mg by mouth 3 (three) times daily. One tab PO qHS for a week, then BID for a week, then TID. May double weekly to a max of 3,600mg /day), Disp: 90 capsule, Rfl: 3   hydrALAZINE (APRESOLINE) 25 MG tablet, Take 1 tablet (25 mg total) by mouth 2 (two) times daily., Disp: 180 tablet, Rfl: 1   levothyroxine (SYNTHROID)  175 MCG tablet, Take 1 tablet (175 mcg total) by mouth daily before breakfast., Disp: 90 tablet, Rfl: 1   Multiple Vitamin (MULTIVITAMIN) tablet, Take 1 tablet by mouth daily., Disp: , Rfl:    predniSONE (DELTASONE) 1 MG tablet, Take 1 tablet (1 mg total) by mouth daily with breakfast., Disp: 90 tablet, Rfl: 1   valsartan-hydrochlorothiazide (DIOVAN-HCT) 320-25 MG tablet, TAKE ONE TABLET BY MOUTH EVERY DAY, Disp: 90 tablet, Rfl: 1   docusate sodium (COLACE) 100 MG capsule, Take 100 mg by mouth 2 (two) times daily as needed for mild constipation or moderate constipation. (Patient not taking: Reported on 05/13/2022), Disp: , Rfl:    ibuprofen (ADVIL) 800 MG tablet, Take 1 tablet (800 mg total) by mouth every 8 (eight) hours as needed. (Patient not taking: Reported on 05/13/2022), Disp: 90 tablet,  Rfl: 2  Allergies:  Allergies  Allergen Reactions   Pregabalin Other (See Comments)    Elevated LFTs   Lipitor [Atorvastatin Calcium] Other (See Comments)    Myalgias.    Lisinopril Nausea Only   Merbromin Hives   Thimerosal (Thiomersal) Hives    Past Medical History, Surgical history, Social history, and Family History were reviewed and updated.  Review of Systems: Review of Systems  Constitutional: Negative.   HENT:  Negative.    Eyes: Negative.   Respiratory: Negative.    Cardiovascular: Negative.   Gastrointestinal: Negative.   Endocrine: Negative.   Genitourinary: Negative.    Musculoskeletal:  Positive for back pain.  Skin: Negative.   Neurological: Negative.   Hematological: Negative.   Psychiatric/Behavioral: Negative.      Physical Exam:  height is 5\' 8"  (1.727 m) and weight is 189 lb 1.9 oz (85.8 kg). His oral temperature is 98.6 F (37 C). His blood pressure is 143/60 (abnormal) and his pulse is 76. His respiration is 20 and oxygen saturation is 99%.   Wt Readings from Last 3 Encounters:  05/13/22 189 lb 1.9 oz (85.8 kg)  05/05/22 192 lb 9.6 oz (87.4 kg)  04/20/22 189 lb (85.7 kg)    Physical Exam Vitals reviewed.  HENT:     Head: Normocephalic and atraumatic.  Eyes:     Pupils: Pupils are equal, round, and reactive to light.  Cardiovascular:     Rate and Rhythm: Normal rate and regular rhythm.     Heart sounds: Normal heart sounds.  Pulmonary:     Effort: Pulmonary effort is normal.     Breath sounds: Normal breath sounds.  Abdominal:     General: Bowel sounds are normal.     Palpations: Abdomen is soft.  Musculoskeletal:        General: No tenderness or deformity. Normal range of motion.     Cervical back: Normal range of motion.  Lymphadenopathy:     Cervical: No cervical adenopathy.  Skin:    General: Skin is warm and dry.     Findings: No erythema or rash.  Neurological:     Mental Status: He is alert and oriented to person, place,  and time.  Psychiatric:        Behavior: Behavior normal.        Thought Content: Thought content normal.        Judgment: Judgment normal.     Lab Results  Component Value Date   WBC 7.4 05/13/2022   HGB 10.7 (L) 05/13/2022   HCT 31.7 (L) 05/13/2022   MCV 94.1 05/13/2022   PLT 214 05/13/2022     Chemistry  Component Value Date/Time   NA 137 05/13/2022 1439   NA 137 04/01/2013 0000   K 4.6 05/13/2022 1439   CL 105 05/13/2022 1439   CO2 26 05/13/2022 1439   BUN 24 (H) 05/13/2022 1439   CREATININE 1.29 (H) 05/13/2022 1439   CREATININE 1.44 (H) 01/11/2022 0945   GLU 101 04/01/2013 0000      Component Value Date/Time   CALCIUM 9.5 05/13/2022 1439   ALKPHOS 72 05/13/2022 1439   AST 14 (L) 05/13/2022 1439   ALT 12 05/13/2022 1439   BILITOT 0.4 05/13/2022 1439      Impression and Plan: Mr. Brucker is a very nice 72 year old white male.  He is incredibly interesting to talk to.  He comes in with his wife.  He served in the [employer].  Again, is hard to say whether or not these lung nodules are significant.  Again the PET scan really did not show much in the way of uptake.  For right now, I would be willing just to watch.  I would probably repeat another CT scan in 2 or 3 months.  I this would be reasonable.  The anemia is also a little bit troublesome.  I think we will going to have to watch this also.  When he comes back, we will do some anemia studies.  Hopefully, we will find that the anemia is some that we can take care of easily.  He said he had a colonoscopy within the past year.  I would like to see him back in July.  We will get a CT scan the same day that we see him.   Josph Macho, MD 5/3/20243:39 PM

## 2022-05-16 ENCOUNTER — Encounter: Payer: Self-pay | Admitting: *Deleted

## 2022-05-16 ENCOUNTER — Encounter: Payer: Self-pay | Admitting: Cardiology

## 2022-05-16 ENCOUNTER — Ambulatory Visit: Payer: Medicare Other | Attending: Cardiology | Admitting: Cardiology

## 2022-05-16 VITALS — BP 170/80 | HR 75 | Ht 68.0 in | Wt 189.0 lb

## 2022-05-16 DIAGNOSIS — J432 Centrilobular emphysema: Secondary | ICD-10-CM

## 2022-05-16 DIAGNOSIS — R002 Palpitations: Secondary | ICD-10-CM

## 2022-05-16 DIAGNOSIS — I7121 Aneurysm of the ascending aorta, without rupture: Secondary | ICD-10-CM | POA: Diagnosis not present

## 2022-05-16 DIAGNOSIS — I10 Essential (primary) hypertension: Secondary | ICD-10-CM

## 2022-05-16 MED ORDER — HYDRALAZINE HCL 25 MG PO TABS: 25.0000 mg | ORAL_TABLET | Freq: Three times a day (TID) | ORAL | 3 refills | Status: DC

## 2022-05-16 NOTE — Addendum Note (Signed)
Addended by: Baldo Ash D on: 05/16/2022 09:35 AM   Modules accepted: Orders

## 2022-05-16 NOTE — Progress Notes (Signed)
Cardiology Office Note:    Date:  05/16/2022   ID:  Walter Wilkerson, DOB 03-Jan-1951, MRN [id number]  PCP:  Jomarie Longs, PA-C  Cardiologist:  Gypsy Balsam, MD    Referring MD: Jomarie Longs, [place]   Chief Complaint  Patient presents with   Follow-up  Doing fine  History of Present Illness:    Walter Wilkerson is a 72 y.o. 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  He comes today to months for follow-up.  Overall seems to be doing well.  Denies have any chest pain tightness squeezing pressure burning chest no palpitations dizziness swelling of lower extremities.  Past Medical History:  Diagnosis Date   Hypertension    Lung cancer [facility])    Thyroid disease     Past Surgical History:  Procedure Laterality Date   LUNG LOBECTOMY Right 2008   RLL resection   R knee arthroscopy     REPLACEMENT TOTAL KNEE Right 09/15/2021   Dr. Jodi Geralds   VASECTOMY      Current Medications: Current Meds  Medication Sig   albuterol (VENTOLIN HFA) 108 (90 Base) MCG/ACT inhaler Inhale 2 puffs into the lungs every 4 (four) hours as needed. (Patient taking differently: Inhale 2 puffs into the lungs every 4 (four) hours as needed for wheezing or shortness of breath.)   amLODipine (NORVASC) 5 MG tablet Take 1 tablet (5 mg total) by mouth daily.   aspirin EC 81 MG tablet Take 81 mg by mouth daily.   atorvastatin (LIPITOR) 40 MG tablet Take 1 tablet (40 mg total) by mouth daily.   celecoxib (CELEBREX) 200 MG capsule Take 200 mg by mouth 2 (two) times daily.   DULoxetine (CYMBALTA) 30 MG capsule Take 1 capsule (30 mg total) by mouth daily.   famotidine (PEPCID) 20 MG tablet Take 1 tablet (20 mg total) by mouth 2 (two) times daily.   Fluticasone-Umeclidin-Vilant (TRELEGY ELLIPTA) 100-62.5-25 MCG/ACT AEPB Inhale 1  puff into the lungs daily in the afternoon.   gabapentin (NEURONTIN) 300 MG capsule One tab PO qHS for a week, then BID for a week, then TID. May double weekly to a max of 3,600mg /day (Patient taking differently: Take 300 mg by mouth 3 (three) times daily. One tab PO qHS for a week, then BID for a week, then TID. May double weekly to a max of 3,600mg /day)   hydrALAZINE (APRESOLINE) 25 MG tablet Take 1 tablet (25 mg total) by mouth 2 (two) times daily.   ibuprofen (ADVIL) 800 MG tablet Take 1 tablet (800 mg total) by mouth every 8 (eight) hours as needed. (Patient taking differently: Take 800 mg by mouth every 8 (eight) hours as needed for mild pain or moderate pain.)   levothyroxine (SYNTHROID) 175 MCG tablet Take 1 tablet (175 mcg total) by mouth daily before breakfast.   Multiple Vitamin (MULTIVITAMIN) tablet Take 1 tablet by mouth daily.   predniSONE (DELTASONE) 1 MG tablet Take 1 tablet (1 mg total) by mouth daily with breakfast.   valsartan-hydrochlorothiazide (DIOVAN-HCT) 320-25 MG tablet TAKE ONE TABLET BY MOUTH EVERY DAY   [DISCONTINUED] docusate sodium (COLACE) 100 MG capsule Take 100 mg by mouth 2 (two) times daily as needed for mild constipation or moderate constipation.     Allergies:   Pregabalin, Lipitor [atorvastatin calcium], Lisinopril, Merbromin, and Thimerosal (thiomersal)   Social History  Socioeconomic History   Marital status: Married    Spouse name: [name]   Number of children: 3   Years of education: 12   Highest education level: 12th grade  Occupational History   Occupation: [employer]    Comment: part time  Tobacco Use   Smoking status: Former    Packs/day: 1.00    Years: 30.00    Additional pack years: 0.00    Total pack years: 30.00    Types: Cigarettes    Quit date: 08/10/2004    Years since quitting: 17.7   Smokeless tobacco: Never  Vaping Use   Vaping Use: Never used  Substance and Sexual Activity   Alcohol use: Yes    Alcohol/week: 21.0  standard drinks of alcohol    Types: 21 Shots of liquor per week    Comment: 1-2 shots every night   Drug use: No   Sexual activity: Not Currently    Partners: Female  Other Topics Concern   Not on file  Social History Narrative   Lives with his wife and son. 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   Social Determinants of Health   Financial Resource Strain: Low Risk  (04/18/2022)   Overall Financial Resource Strain (CARDIA)    Difficulty of Paying Living Expenses: Not hard at all  Food Insecurity: No Food Insecurity (04/18/2022)   Hunger Vital Sign    Worried About Running Out of Food in the Last Year: Never true    Ran Out of Food in the Last Year: Never true  Transportation Needs: No Transportation Needs (04/18/2022)   PRAPARE - Administrator, Civil Service (Medical): No    Lack of Transportation (Non-Medical): No  Physical Activity: Inactive (04/18/2022)   Exercise Vital Sign    Days of Exercise per Week: 0 days    Minutes of Exercise per Session: 0 min  Stress: No Stress Concern Present (04/18/2022)   [employer] of Occupational Health - Occupational Stress Questionnaire    Feeling of Stress : Not at all  Social Connections: Moderately Integrated (04/18/2022)   Social Connection and Isolation Panel [NHANES]    Frequency of Communication with Friends and Family: More than three times a week    Frequency of Social Gatherings with Friends and Family: More than three times a week    Attends Religious Services: More than 4 times per year    Active Member of [employer] or Organizations: No    Attends Engineer, structural: Never    Marital Status: Married     Family History: The patient's family history includes Diabetes in an other family member; Heart attack in his father; Heart disease in his father; Hyperlipidemia in his father; Hypertension in his father. ROS:   Please see the history of present illness.    All 14 point review of systems negative except  as described per history of present illness  EKGs/Labs/Other Studies Reviewed:      Recent Labs: 04/26/2022: TSH 3.88 05/13/2022: ALT 12; BUN 24; Creatinine 1.29; Hemoglobin 10.7; Platelet Count 214; Potassium 4.6; Sodium 137  Recent Lipid Panel    Component Value Date/Time   CHOL 198 01/11/2022 0945   TRIG 61 01/11/2022 0945   HDL 127 01/11/2022 0945   CHOLHDL 1.6 01/11/2022 0945   VLDL 10 05/27/2016 0900   LDLCALC 57 01/11/2022 0945    Physical Exam:    VS:  BP (!) 170/80 (BP Location: Left Arm, Patient Position: Sitting)   Pulse 75  Ht 5\' 8"  (1.727 m)   Wt 189 lb (85.7 kg)   SpO2 97%   BMI 28.74 kg/m     Wt Readings from Last 3 Encounters:  05/16/22 189 lb (85.7 kg)  05/13/22 189 lb 1.9 oz (85.8 kg)  05/05/22 192 lb 9.6 oz (87.4 kg)     GEN:  Well nourished, well developed in no acute distress HEENT: Normal NECK: No JVD; No carotid bruits LYMPHATICS: No lymphadenopathy CARDIAC: RRR, no murmurs, no rubs, no gallops RESPIRATORY:  Clear to auscultation without rales, wheezing or rhonchi  ABDOMEN: Soft, non-tender, non-distended MUSCULOSKELETAL:  No edema; No deformity  SKIN: Warm and dry LOWER EXTREMITIES: no swelling NEUROLOGIC:  Alert and oriented x 3 PSYCHIATRIC:  Normal affect   ASSESSMENT:    1. Aneurysm of ascending aorta without rupture (HCC)   2. Essential hypertension   3. Centrilobular emphysema (HCC)   4. Palpitations    PLAN:    In order of problems listed above:  Ascending arctic aneurysm 40 mm.  Will continue monitoring decrease risk factors modification including reduction of his blood pressure. Essential hypertension: Uncontrolled.  Asking to increase dose of hydralazine to 25 3 times daily.  He does have appoint with his primary care physician within the next 3 weeks and they will continue following diet.  Target blood pressure in his situation should be less than 130/70. COPD/emphysema status post lung cancer.  Stable from that point  review. Palpitations I cannot find a monitor will investigate what happened we did   Medication Adjustments/Labs and Tests Ordered: Current medicines are reviewed at length with the patient today.  Concerns regarding medicines are outlined above.  No orders of the defined types were placed in this encounter.  Medication changes: No orders of the defined types were placed in this encounter.   Signed, Georgeanna Lea, MD, [facility] 05/16/2022 9:20 AM    [facility]

## 2022-05-16 NOTE — Patient Instructions (Signed)
Medication Instructions:   INCREASE: Hydralazine to 25mg  1 tablet three times daily   Lab Work: None Ordered If you have labs (blood work) drawn today and your tests are completely normal, you will receive your results only by: MyChart Message (if you have MyChart) OR A paper copy in the mail If you have any lab test that is abnormal or we need to change your treatment, we will call you to review the results.   Testing/Procedures: None Ordered   Follow-Up: At [facility], you and your health needs are our priority.  As part of our continuing mission to provide you with exceptional heart care, we have created designated Provider Care Teams.  These Care Teams include your primary Cardiologist (physician) and Advanced Practice Providers (APPs -  Physician Assistants and Nurse Practitioners) who all work together to provide you with the care you need, when you need it.  We recommend signing up for the patient portal called "MyChart".  Sign up information is provided on this After Visit Summary.  MyChart is used to connect with patients for Virtual Visits (Telemedicine).  Patients are able to view lab/test results, encounter notes, upcoming appointments, etc.  Non-urgent messages can be sent to your provider as well.   To learn more about what you can do with MyChart, go to [email or web address].    Your next appointment:   6 month(s)  The format for your next appointment:   In Person  Provider:   Gypsy Balsam, MD    Other Instructions NA

## 2022-05-16 NOTE — Progress Notes (Signed)
Patient in the office to discuss PET scan results. At this time, there is no definitive evidence of malignancy and he will be under surveillance with routined scans. Since there is no diagnosed malignancy, will discontinue navigation at this time but be available to the patient as needed.  Oncology Nurse Navigator Documentation     05/16/2022   11:00 AM  Oncology Nurse Navigator Flowsheets  Navigation Complete Date: 05/16/2022  Post Navigation: Continue to Follow Patient? No  Reason Not Navigating Patient: No Cancer Diagnosis  Navigator Location [facility]  Navigator Encounter Type Appt/Treatment Plan Review  Patient Visit Type MedOnc  Treatment Phase Abnormal Scans  Barriers/Navigation Needs Coordination of Care;Education  Interventions None Required  Acuity Level 2-Minimal Needs (1-2 Barriers Identified)  Support Groups/Services Friends and Family  Time Spent with Patient 15

## 2022-05-17 DIAGNOSIS — R002 Palpitations: Secondary | ICD-10-CM | POA: Diagnosis not present

## 2022-05-18 DIAGNOSIS — G4733 Obstructive sleep apnea (adult) (pediatric): Secondary | ICD-10-CM | POA: Diagnosis not present

## 2022-05-19 ENCOUNTER — Telehealth: Payer: Self-pay

## 2022-05-19 NOTE — Telephone Encounter (Signed)
Patient notified through my chart.

## 2022-05-19 NOTE — Telephone Encounter (Signed)
-----   Message from Georgeanna Lea, MD sent at 05/12/2022  8:26 AM EDT ----- Duplex of lower extremity showing no critical stenosis

## 2022-05-20 ENCOUNTER — Ambulatory Visit (INDEPENDENT_AMBULATORY_CARE_PROVIDER_SITE_OTHER): Payer: Medicare Other | Admitting: Sports Medicine

## 2022-05-20 DIAGNOSIS — M47816 Spondylosis without myelopathy or radiculopathy, lumbar region: Secondary | ICD-10-CM | POA: Diagnosis not present

## 2022-05-20 MED ORDER — HYDROCODONE-ACETAMINOPHEN 10-325 MG PO TABS: 1.0000 | ORAL_TABLET | Freq: Three times a day (TID) | ORAL | 0 refills | Status: DC | PRN

## 2022-05-20 MED ORDER — KETOROLAC TROMETHAMINE 30 MG/ML IJ SOLN
30.0000 mg | Freq: Once | INTRAMUSCULAR | Status: AC
Start: 2022-05-20 — End: 2022-05-20
  Administered 2022-05-20: 30 mg via INTRAMUSCULAR

## 2022-05-20 NOTE — Progress Notes (Addendum)
    Procedures performed today:    None.  Independent interpretation of notes and tests performed by another provider:   None.  Brief History, Exam, Impression, and Recommendations:    Lumbar spondylosis Walter Wilkerson returns, he is a very pleasant 72 year old male, known lumbar degenerative disc disease, he does have spinal stenosis seen by me on CT with multilevel DDD seen on x-rays. He has now had over 6 weeks of pain, he has had conservative treatment in the form of home physical therapy, steroids, ibuprofen, gabapentin. Pain was initially axial and discogenic but now has worsened and is radiating down the right leg with some progressive weakness. Due to severity of pain and progressive weakness we will proceed with MRI, have also given him some high-dose hydrocodone, Toradol 30 intramuscular. We will also increase his gabapentin to 600 mg 3 times daily. He would like me to go and order the epidural soon as I see the MRI results and we will see him back about a month after his epidural.  For insurance approval purposes he has failed greater than 6 weeks of physical therapy, steroids, NSAIDs, x-rays are in the chart and he is having progressive weakness.  I would like his MRI to be done this weekend.  Update: MRI does show severe spinal stenosis L1-L2, proceeding with right L1-L2 interlaminar epidural, if he does not get dramatic relief from this epidural I would recommend surgical consultation.    ____________________________________________ Ihor Austin. Benjamin Stain, M.D., ABFM., CAQSM., AME. Primary Care and Sports Medicine  [facility]  Adjunct Professor of Family Medicine  [employer] [employer] [facility] [employer]  Restaurant manager, fast food

## 2022-05-20 NOTE — Assessment & Plan Note (Addendum)
Walter Wilkerson returns, he is a very pleasant 72 year old male, known lumbar degenerative disc disease, he does have spinal stenosis seen by me on CT with multilevel DDD seen on x-rays. He has now had over 6 weeks of pain, he has had conservative treatment in the form of home physical therapy, steroids, ibuprofen, gabapentin. Pain was initially axial and discogenic but now has worsened and is radiating down the right leg with some progressive weakness. Due to severity of pain and progressive weakness we will proceed with MRI, have also given him some high-dose hydrocodone, Toradol 30 intramuscular. We will also increase his gabapentin to 600 mg 3 times daily. He would like me to go and order the epidural soon as I see the MRI results and we will see him back about a month after his epidural.  For insurance approval purposes he has failed greater than 6 weeks of physical therapy, steroids, NSAIDs, x-rays are in the chart and he is having progressive weakness.  I would like his MRI to be done this weekend.  Update: MRI does show severe spinal stenosis L1-L2, proceeding with right L1-L2 interlaminar epidural, if he does not get dramatic relief from this epidural I would recommend surgical consultation.

## 2022-05-21 ENCOUNTER — Ambulatory Visit (INDEPENDENT_AMBULATORY_CARE_PROVIDER_SITE_OTHER): Payer: Medicare Other

## 2022-05-21 DIAGNOSIS — M4726 Other spondylosis with radiculopathy, lumbar region: Secondary | ICD-10-CM | POA: Diagnosis not present

## 2022-05-21 DIAGNOSIS — M47816 Spondylosis without myelopathy or radiculopathy, lumbar region: Secondary | ICD-10-CM

## 2022-05-21 DIAGNOSIS — M5126 Other intervertebral disc displacement, lumbar region: Secondary | ICD-10-CM | POA: Diagnosis not present

## 2022-05-21 DIAGNOSIS — M5136 Other intervertebral disc degeneration, lumbar region: Secondary | ICD-10-CM | POA: Diagnosis not present

## 2022-05-21 DIAGNOSIS — M48061 Spinal stenosis, lumbar region without neurogenic claudication: Secondary | ICD-10-CM | POA: Diagnosis not present

## 2022-05-23 NOTE — Addendum Note (Signed)
Addended by: Monica Becton on: 05/23/2022 07:15 AM   Modules accepted: Orders

## 2022-05-29 DIAGNOSIS — R002 Palpitations: Secondary | ICD-10-CM | POA: Diagnosis not present

## 2022-05-30 ENCOUNTER — Other Ambulatory Visit: Payer: Self-pay | Admitting: Sports Medicine

## 2022-05-30 ENCOUNTER — Other Ambulatory Visit: Payer: Self-pay | Admitting: Physician Assistant

## 2022-05-30 ENCOUNTER — Ambulatory Visit
Admission: RE | Admit: 2022-05-30 | Discharge: 2022-05-30 | Disposition: A | Discharge status: Home / Self Care | Payer: Medicare Other | Source: Ambulatory Visit | Attending: Sports Medicine | Admitting: Sports Medicine

## 2022-05-30 DIAGNOSIS — M545 Low back pain, unspecified: Secondary | ICD-10-CM | POA: Diagnosis not present

## 2022-05-30 DIAGNOSIS — M47816 Spondylosis without myelopathy or radiculopathy, lumbar region: Secondary | ICD-10-CM

## 2022-05-30 DIAGNOSIS — M79606 Pain in leg, unspecified: Secondary | ICD-10-CM | POA: Diagnosis not present

## 2022-05-30 DIAGNOSIS — M4727 Other spondylosis with radiculopathy, lumbosacral region: Secondary | ICD-10-CM | POA: Diagnosis not present

## 2022-05-30 DIAGNOSIS — M48062 Spinal stenosis, lumbar region with neurogenic claudication: Secondary | ICD-10-CM | POA: Diagnosis not present

## 2022-05-30 MED ORDER — IOPAMIDOL (ISOVUE-M 200) INJECTION 41%
1.0000 mL | Freq: Once | INTRAMUSCULAR | Status: AC
  Administered 2022-05-30: 1 mL via EPIDURAL

## 2022-05-30 MED ORDER — METHYLPREDNISOLONE ACETATE 40 MG/ML INJ SUSP (RADIOLOG
80.0000 mg | Freq: Once | INTRAMUSCULAR | Status: AC
  Administered 2022-05-30: 80 mg via EPIDURAL

## 2022-05-30 NOTE — Discharge Instructions (Signed)

## 2022-06-03 ENCOUNTER — Telehealth: Payer: Self-pay

## 2022-06-03 NOTE — Telephone Encounter (Signed)
Patient notified through my chart.

## 2022-06-03 NOTE — Telephone Encounter (Signed)
-----   Message from Georgeanna Lea, MD sent at 05/31/2022 12:38 PM EDT ----- Monitor showed some supraventricular tachycardia, no need to treat unless symptomatic.  Will talk details during the visit

## 2022-06-09 ENCOUNTER — Ambulatory Visit (INDEPENDENT_AMBULATORY_CARE_PROVIDER_SITE_OTHER): Payer: Medicare Other | Admitting: Sports Medicine

## 2022-06-09 ENCOUNTER — Other Ambulatory Visit: Payer: Self-pay | Admitting: Physician Assistant

## 2022-06-09 DIAGNOSIS — M47816 Spondylosis without myelopathy or radiculopathy, lumbar region: Secondary | ICD-10-CM | POA: Diagnosis not present

## 2022-06-09 DIAGNOSIS — G894 Chronic pain syndrome: Secondary | ICD-10-CM

## 2022-06-09 NOTE — Assessment & Plan Note (Addendum)
Walter Wilkerson is a very pleasant 72 year old male with known lumbar spinal stenosis L1-L2, he failed conservative treatment so we proceeded with a lumbar epidural, he has responded really well to an L1-L2 right-sided interlaminar epidural with near complete relief of pain, he is wondering if he can taper down on some of his medications, I have asked him to cut his gabapentin and ibuprofen in half, if pain relief is adequate he can continue the half dose , if not he can go back up to a full dose, he also understands that he can have up to 6 epidurals in a year. For now return to see me on an as-needed basis.

## 2022-06-09 NOTE — Progress Notes (Signed)
    Procedures performed today:    None.  Independent interpretation of notes and tests performed by another provider:   None.  Brief History, Exam, Impression, and Recommendations:    Lumbar spondylosis Walter Wilkerson is a very pleasant 72 year old male with known lumbar spinal stenosis L1-L2, he failed conservative treatment so we proceeded with a lumbar epidural, he has responded really well to an L1-L2 right-sided interlaminar epidural with near complete relief of pain, he is wondering if he can taper down on some of his medications, I have asked him to cut his gabapentin and ibuprofen in half, if pain relief is adequate he can continue the half dose , if not he can go back up to a full dose, he also understands that he can have up to 6 epidurals in a year. For now return to see me on an as-needed basis.    ____________________________________________ Ihor Austin. Benjamin Stain, M.D., ABFM., CAQSM., AME. Primary Care and Sports Medicine [facility]  Adjunct Professor of Family Medicine  [place] [employer] [facility] [employer]  Restaurant manager, fast food

## 2022-06-13 ENCOUNTER — Ambulatory Visit (INDEPENDENT_AMBULATORY_CARE_PROVIDER_SITE_OTHER): Payer: Medicare Other | Admitting: Physician Assistant

## 2022-06-13 ENCOUNTER — Encounter: Payer: Self-pay | Admitting: Physician Assistant

## 2022-06-13 VITALS — BP 139/71 | HR 85 | Ht 68.0 in | Wt 192.0 lb

## 2022-06-13 DIAGNOSIS — I10 Essential (primary) hypertension: Secondary | ICD-10-CM | POA: Diagnosis not present

## 2022-06-13 DIAGNOSIS — R918 Other nonspecific abnormal finding of lung field: Secondary | ICD-10-CM | POA: Diagnosis not present

## 2022-06-13 DIAGNOSIS — M542 Cervicalgia: Secondary | ICD-10-CM | POA: Diagnosis not present

## 2022-06-13 DIAGNOSIS — G894 Chronic pain syndrome: Secondary | ICD-10-CM

## 2022-06-13 MED ORDER — AMLODIPINE BESYLATE 5 MG PO TABS
5.0000 mg | ORAL_TABLET | Freq: Every day | ORAL | 1 refills | Status: DC
Start: 2022-06-13 — End: 2022-11-25

## 2022-06-13 NOTE — Progress Notes (Unsigned)
Established Patient Office Visit  Subjective   Patient ID: Walter Wilkerson, male    DOB: Aug 12, 1950  Age: 72 y.o. MRN: [id number]  Chief Complaint  Patient presents with   Follow-up    HPI Pt is a 72 yo male with HTN, hypothyroidism, lumbar spondylosis, lung nodules with hx of lung cancer who presents to the clinic for follow up.   Recently saw Dr. Karie Schwalbe and ordered epidural for low back pain. Great relief with this. He has decreased gabapentin due to better pain control and not taking norco. He is having some right sided neck pain and stiffiness. No numbness,tingling, radiation of pain down arm. No injury to neck.   BP doing well at home. She is on amlodipine,  hydralazine, valsartan/HCTZ and doing well. No concerns. No lower ext edema. Denies any CP or palpitations.    .. Active Ambulatory Problems    Diagnosis Date Noted   Acquired hypothyroidism 08/11/2014   Essential hypertension 08/11/2014   Fatigue 11/04/2016   Chronic pain syndrome 05/29/2018   Muscle cramps 09/03/2019   Lumbar spondylosis 12/03/2019   Right hand pain 12/03/2019   Primary osteoarthritis involving multiple joints 12/03/2019   Allergic sinusitis 06/03/2020   Hand cramp 04/14/2021   Thrombocytopenia (HCC) 01/17/2022   Abnormal CT scan of lung 03/08/2022   Centrilobular emphysema (HCC) 03/08/2022   Intermittent chest pain 03/08/2022   Palpitations 03/08/2022   Newly recognized heart murmur 03/08/2022   Multiple lung nodules on CT 03/25/2022   Abnormal CT scan, lung 03/25/2022   History of lung cancer 03/25/2022   Thoracic aortic aneurysm 40 mm very of CT from 2024 04/20/2022   Neck pain on right side 06/14/2022   Resolved Ambulatory Problems    Diagnosis Date Noted   Hyperlipidemia 08/11/2014   Peripheral neuropathy (HCC) 08/11/2014   Non-small cell cancer of right lung (HCC) 08/11/2014   Acute bronchitis 01/08/2015   Shoulder impingement 02/24/2015   Hyperglycemia 04/14/2015   Prediabetes  04/15/2015   Dermatitis 10/18/2015   Cyst of pharynx 01/12/2016   Aortic atherosclerosis (HCC) 01/12/2016   Elevated serum creatinine 02/10/2016   Stress at work 11/04/2016   Adhesive capsulitis 12/27/2016   Nausea 04/20/2017   Epigastric pain 04/20/2017   PUD (peptic ulcer disease) 04/20/2017   Metatarsal stress fracture of right foot 01/15/2018   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 03/02/2018   Injury of right knee 09/03/2019   Elevated liver enzymes 09/03/2019   Chronic pain of right knee 12/03/2019   NSAID induced gastritis 12/03/2019   Polyarthritis 02/16/2021   Leg cramps 04/14/2021   EKG abnormalities 08/27/2021   Gastroesophageal reflux disease 08/27/2021   CAD (coronary artery disease) 03/25/2022   Aortic aneurysm, abdominal (HCC) 03/25/2022   Past Medical History:  Diagnosis Date   Hypertension    Lung cancer (HCC)    Thyroid disease      ROS    Objective:     BP 139/71   Pulse 85   Ht 5\' 8"  (1.727 m)   Wt 192 lb (87.1 kg)   SpO2 97%   BMI 29.19 kg/m  BP Readings from Last 3 Encounters:  06/13/22 139/71  05/30/22 (!) 184/92  05/16/22 (!) 170/80   Wt Readings from Last 3 Encounters:  06/13/22 192 lb (87.1 kg)  05/16/22 189 lb (85.7 kg)  05/13/22 189 lb 1.9 oz (85.8 kg)      Physical Exam Constitutional:      Appearance: Normal appearance.  HENT:  Head: Normocephalic.  Neck:     Comments: Decreased ROM to the left due to neck pain on the right. Tight paraspinal muscles of cervical spine and tender to palpation.  Cardiovascular:     Rate and Rhythm: Normal rate and regular rhythm.  Pulmonary:     Effort: Pulmonary effort is normal.     Breath sounds: Normal breath sounds.     Comments: Absent right lower lung breath sounds due to resection.  Neurological:     Mental Status: He is alert.       Assessment & Plan:  [name] KitchenOryan was seen today for follow-up.  Diagnoses and all orders for this visit:  Essential hypertension -      amLODipine (NORVASC) 5 MG tablet; Take 1 tablet (5 mg total) by mouth daily.  Neck pain on right side  Multiple lung nodules on CT  Chronic pain syndrome    BP looks great! Refilled norvasc. Continue on same BP medication regimen.  Lung nodules managed and watched by oncology.  Discussed right sided neck pain and massage.  Consider icy hot patches and good supportive cervical pillow.  Continue gabapentin and cymbalta for chronic pain.    Tandy Gaw, PA-C

## 2022-06-14 ENCOUNTER — Encounter: Payer: Self-pay | Admitting: Physician Assistant

## 2022-06-14 DIAGNOSIS — M542 Cervicalgia: Secondary | ICD-10-CM | POA: Insufficient documentation

## 2022-06-18 DIAGNOSIS — G4733 Obstructive sleep apnea (adult) (pediatric): Secondary | ICD-10-CM | POA: Diagnosis not present

## 2022-06-27 DIAGNOSIS — R112 Nausea with vomiting, unspecified: Secondary | ICD-10-CM | POA: Diagnosis not present

## 2022-06-27 DIAGNOSIS — Z79899 Other long term (current) drug therapy: Secondary | ICD-10-CM | POA: Diagnosis not present

## 2022-06-27 DIAGNOSIS — Z7951 Long term (current) use of inhaled steroids: Secondary | ICD-10-CM | POA: Diagnosis not present

## 2022-06-27 DIAGNOSIS — R1084 Generalized abdominal pain: Secondary | ICD-10-CM | POA: Diagnosis not present

## 2022-06-27 DIAGNOSIS — R918 Other nonspecific abnormal finding of lung field: Secondary | ICD-10-CM | POA: Diagnosis not present

## 2022-06-27 DIAGNOSIS — I10 Essential (primary) hypertension: Secondary | ICD-10-CM | POA: Diagnosis not present

## 2022-06-27 DIAGNOSIS — R0689 Other abnormalities of breathing: Secondary | ICD-10-CM | POA: Diagnosis not present

## 2022-06-27 DIAGNOSIS — J986 Disorders of diaphragm: Secondary | ICD-10-CM | POA: Diagnosis not present

## 2022-06-27 DIAGNOSIS — Z7982 Long term (current) use of aspirin: Secondary | ICD-10-CM | POA: Diagnosis not present

## 2022-06-27 DIAGNOSIS — I2510 Atherosclerotic heart disease of native coronary artery without angina pectoris: Secondary | ICD-10-CM | POA: Diagnosis not present

## 2022-06-27 DIAGNOSIS — J432 Centrilobular emphysema: Secondary | ICD-10-CM | POA: Diagnosis not present

## 2022-06-27 DIAGNOSIS — Z888 Allergy status to other drugs, medicaments and biological substances status: Secondary | ICD-10-CM | POA: Diagnosis not present

## 2022-06-27 DIAGNOSIS — E785 Hyperlipidemia, unspecified: Secondary | ICD-10-CM | POA: Diagnosis not present

## 2022-06-27 DIAGNOSIS — R1013 Epigastric pain: Secondary | ICD-10-CM | POA: Diagnosis not present

## 2022-06-27 DIAGNOSIS — N281 Cyst of kidney, acquired: Secondary | ICD-10-CM | POA: Diagnosis not present

## 2022-06-27 DIAGNOSIS — D7389 Other diseases of spleen: Secondary | ICD-10-CM | POA: Diagnosis not present

## 2022-06-27 DIAGNOSIS — R109 Unspecified abdominal pain: Secondary | ICD-10-CM | POA: Diagnosis not present

## 2022-06-27 DIAGNOSIS — Z79891 Long term (current) use of opiate analgesic: Secondary | ICD-10-CM | POA: Diagnosis not present

## 2022-06-27 DIAGNOSIS — E039 Hypothyroidism, unspecified: Secondary | ICD-10-CM | POA: Diagnosis not present

## 2022-06-27 DIAGNOSIS — Z85118 Personal history of other malignant neoplasm of bronchus and lung: Secondary | ICD-10-CM | POA: Diagnosis not present

## 2022-06-27 DIAGNOSIS — K219 Gastro-esophageal reflux disease without esophagitis: Secondary | ICD-10-CM | POA: Diagnosis not present

## 2022-06-28 DIAGNOSIS — Z789 Other specified health status: Secondary | ICD-10-CM | POA: Diagnosis not present

## 2022-06-28 DIAGNOSIS — Z791 Long term (current) use of non-steroidal anti-inflammatories (NSAID): Secondary | ICD-10-CM | POA: Diagnosis not present

## 2022-06-28 DIAGNOSIS — I10 Essential (primary) hypertension: Secondary | ICD-10-CM | POA: Diagnosis not present

## 2022-06-28 DIAGNOSIS — D539 Nutritional anemia, unspecified: Secondary | ICD-10-CM | POA: Diagnosis not present

## 2022-06-28 DIAGNOSIS — F1090 Alcohol use, unspecified, uncomplicated: Secondary | ICD-10-CM | POA: Diagnosis not present

## 2022-06-28 DIAGNOSIS — R112 Nausea with vomiting, unspecified: Secondary | ICD-10-CM | POA: Diagnosis not present

## 2022-06-28 DIAGNOSIS — R1013 Epigastric pain: Secondary | ICD-10-CM | POA: Diagnosis not present

## 2022-06-30 DIAGNOSIS — R112 Nausea with vomiting, unspecified: Secondary | ICD-10-CM | POA: Diagnosis not present

## 2022-06-30 DIAGNOSIS — I10 Essential (primary) hypertension: Secondary | ICD-10-CM | POA: Diagnosis not present

## 2022-06-30 DIAGNOSIS — R1013 Epigastric pain: Secondary | ICD-10-CM | POA: Diagnosis not present

## 2022-07-18 DIAGNOSIS — G4733 Obstructive sleep apnea (adult) (pediatric): Secondary | ICD-10-CM | POA: Diagnosis not present

## 2022-07-26 ENCOUNTER — Ambulatory Visit (HOSPITAL_BASED_OUTPATIENT_CLINIC_OR_DEPARTMENT_OTHER)
Admission: RE | Admit: 2022-07-26 | Discharge: 2022-07-26 | Disposition: A | Discharge status: Home / Self Care | Payer: Medicare Other | Source: Ambulatory Visit | Attending: Hematology & Oncology | Admitting: Hematology & Oncology

## 2022-07-26 DIAGNOSIS — R918 Other nonspecific abnormal finding of lung field: Secondary | ICD-10-CM | POA: Insufficient documentation

## 2022-07-26 DIAGNOSIS — D631 Anemia in chronic kidney disease: Secondary | ICD-10-CM | POA: Insufficient documentation

## 2022-07-26 DIAGNOSIS — N183 Chronic kidney disease, stage 3 unspecified: Secondary | ICD-10-CM | POA: Insufficient documentation

## 2022-07-26 DIAGNOSIS — R911 Solitary pulmonary nodule: Secondary | ICD-10-CM | POA: Diagnosis not present

## 2022-07-27 ENCOUNTER — Encounter: Payer: Self-pay | Admitting: *Deleted

## 2022-08-02 ENCOUNTER — Other Ambulatory Visit: Payer: Self-pay | Admitting: Physician Assistant

## 2022-08-02 ENCOUNTER — Other Ambulatory Visit: Payer: Self-pay | Admitting: Cardiology

## 2022-08-05 ENCOUNTER — Inpatient Hospital Stay: Payer: Medicare Other | Attending: Hematology & Oncology

## 2022-08-05 ENCOUNTER — Inpatient Hospital Stay: Payer: Medicare Other | Admitting: Hematology & Oncology

## 2022-08-05 ENCOUNTER — Other Ambulatory Visit: Payer: Self-pay

## 2022-08-05 VITALS — BP 150/66 | HR 70 | Temp 97.7°F | Resp 19 | Ht 68.0 in | Wt 190.0 lb

## 2022-08-05 DIAGNOSIS — D631 Anemia in chronic kidney disease: Secondary | ICD-10-CM

## 2022-08-05 DIAGNOSIS — R918 Other nonspecific abnormal finding of lung field: Secondary | ICD-10-CM | POA: Insufficient documentation

## 2022-08-05 DIAGNOSIS — D696 Thrombocytopenia, unspecified: Secondary | ICD-10-CM

## 2022-08-05 DIAGNOSIS — D649 Anemia, unspecified: Secondary | ICD-10-CM | POA: Diagnosis not present

## 2022-08-05 LAB — CMP (CANCER CENTER ONLY)
ALT: 9 U/L (ref 0–44)
AST: 13 U/L — ABNORMAL LOW (ref 15–41)
Albumin: 3.9 g/dL (ref 3.5–5.0)
Alkaline Phosphatase: 56 U/L (ref 38–126)
Anion gap: 8 (ref 5–15)
BUN: 16 mg/dL (ref 8–23)
CO2: 25 mmol/L (ref 22–32)
Calcium: 9.6 mg/dL (ref 8.9–10.3)
Chloride: 106 mmol/L (ref 98–111)
Creatinine: 1.03 mg/dL (ref 0.61–1.24)
GFR, Estimated: >60 mL/min (ref >60)
Glucose, Bld: 94 mg/dL (ref 70–99)
Potassium: 4.2 mmol/L (ref 3.5–5.1)
Sodium: 139 mmol/L (ref 135–145)
Total Bilirubin: 0.4 mg/dL (ref 0.3–1.2)
Total Protein: 7.1 g/dL (ref 6.5–8.1)

## 2022-08-05 LAB — FERRITIN: Ferritin: 20 ng/mL — ABNORMAL LOW (ref 24–336)

## 2022-08-05 LAB — CBC WITH DIFFERENTIAL (CANCER CENTER ONLY)
Abs Immature Granulocytes: 0.05 10*3/uL (ref 0.00–0.07)
Basophils Absolute: 0.1 10*3/uL (ref 0.0–0.1)
Basophils Relative: 2 %
Eosinophils Absolute: 0.3 10*3/uL (ref 0.0–0.5)
Eosinophils Relative: 5 %
HCT: 32.1 % — ABNORMAL LOW (ref 39.0–52.0)
Hemoglobin: 10.7 g/dL — ABNORMAL LOW (ref 13.0–17.0)
Immature Granulocytes: 1 %
Lymphocytes Relative: 23 %
Lymphs Abs: 1.2 10*3/uL (ref 0.7–4.0)
MCH: 30.8 pg (ref 26.0–34.0)
MCHC: 33.3 g/dL (ref 30.0–36.0)
MCV: 92.5 fL (ref 80.0–100.0)
Monocytes Absolute: 0.7 10*3/uL (ref 0.1–1.0)
Monocytes Relative: 13 %
Neutro Abs: 2.9 10*3/uL (ref 1.7–7.7)
Neutrophils Relative %: 56 %
Platelet Count: 227 10*3/uL (ref 150–400)
RBC: 3.47 MIL/uL — ABNORMAL LOW (ref 4.22–5.81)
RDW: 15.5 % (ref 11.5–15.5)
WBC Count: 5.2 10*3/uL (ref 4.0–10.5)
nRBC: 0 % (ref 0.0–0.2)

## 2022-08-05 LAB — IRON AND IRON BINDING CAPACITY (CC-WL,HP ONLY)
Iron: 82 ug/dL (ref 45–182)
Saturation Ratios: 24 % (ref 17.9–39.5)
TIBC: 346 ug/dL (ref 250–450)
UIBC: 264 ug/dL (ref 117–376)

## 2022-08-05 LAB — RETICULOCYTES
Immature Retic Fract: 9.2 % (ref 2.3–15.9)
RBC.: 3.46 MIL/uL — ABNORMAL LOW (ref 4.22–5.81)
Retic Count, Absolute: 60.9 10*3/uL (ref 19.0–186.0)
Retic Ct Pct: 1.8 % (ref 0.4–3.1)

## 2022-08-05 LAB — VITAMIN B12: Vitamin B-12: 719 pg/mL (ref 180–914)

## 2022-08-05 NOTE — Progress Notes (Signed)
Hematology and Oncology Follow Up Visit  Walter Wilkerson [id number] 11/14/1950 72 y.o. 08/05/2022   Principle Diagnosis:  Multiple pulmonary nodules -history of tobacco use Normochromic normocytic anemia  Current Therapy:   Observation     Interim History:  Walter Wilkerson is back for follow-up.  He is doing quite well.  He is quite tanned.  He just got back from the [place].  [place] is where his wife who is from.  He and his wife, after she retires, we will likely move down there.  He had a CT scan that was done on 07/26/2022.  The CT scan did not show anything that looked like malignancy with his lungs.  He had pulmonary nodules.  Some of these had regressed in size.  Some also had resolved.  For right now, I really not too concerned about the lung nodules.  I am little more worried about the anemia.  He still has some anemia.  When we checked an erythropoietin level back in September 2023, it was only 10.  This might be the problem.  He has had no bleeding.  Is been no change in bowel or bladder habits.  He has had no nausea or vomiting.  He has had good appetite.  Not a vegetarian.  Overall, I would say that his performance status is probably ECOG 0.     Medications:  Current Outpatient Medications:    pantoprazole (PROTONIX) 40 MG tablet, Take 40 mg by mouth daily., Disp: , Rfl:    promethazine (PHENERGAN) 12.5 MG tablet, Take 12.5 mg by mouth every 6 (six) hours as needed for nausea., Disp: , Rfl:    albuterol (VENTOLIN HFA) 108 (90 Base) MCG/ACT inhaler, Inhale 2 puffs into the lungs every 4 (four) hours as needed. (Patient taking differently: Inhale 2 puffs into the lungs every 4 (four) hours as needed for wheezing or shortness of breath.), Disp: 6.7 g, Rfl: 0   amLODipine (NORVASC) 5 MG tablet, Take 1 tablet (5 mg total) by mouth daily., Disp: 90 tablet, Rfl: 1   aspirin EC 81 MG tablet, Take 81 mg by mouth daily., Disp: , Rfl:    atorvastatin (LIPITOR) 40 MG tablet, Take 1  tablet (40 mg total) by mouth daily., Disp: 90 tablet, Rfl: 3   cetirizine (ZYRTEC) 10 MG tablet, Take 10 mg by mouth daily., Disp: , Rfl:    cyanocobalamin 1000 MCG tablet, Take 1,000 mcg by mouth daily., Disp: , Rfl:    DULoxetine (CYMBALTA) 30 MG capsule, Take 1 capsule (30 mg total) by mouth daily., Disp: 90 capsule, Rfl: 1   famotidine (PEPCID) 20 MG tablet, Take 1 tablet (20 mg total) by mouth 2 (two) times daily., Disp: 180 tablet, Rfl: 3   Fluticasone-Umeclidin-Vilant (TRELEGY ELLIPTA) 100-62.5-25 MCG/ACT AEPB, Inhale 1 puff into the lungs daily in the afternoon., Disp: 60 each, Rfl: 5   gabapentin (NEURONTIN) 300 MG capsule, Take 2 capsules (600 mg total) by mouth 3 (three) times daily. One tab PO qHS for a week, then BID for a week, then TID. May double weekly to a max of 3,600mg /day, Disp: , Rfl:    hydrALAZINE (APRESOLINE) 50 MG tablet, TAKE ONE TABLET BY MOUTH THREE TIMES DAILY, Disp: 270 tablet, Rfl: 0   levothyroxine (SYNTHROID) 175 MCG tablet, Take 1 tablet (175 mcg total) by mouth daily before breakfast., Disp: 90 tablet, Rfl: 1   Multiple Vitamin (MULTIVITAMIN) tablet, Take 1 tablet by mouth daily., Disp: , Rfl:    predniSONE (DELTASONE) 1  MG tablet, Take 1 tablet (1 mg total) by mouth daily with breakfast., Disp: 90 tablet, Rfl: 1   valsartan-hydrochlorothiazide (DIOVAN-HCT) 320-25 MG tablet, TAKE ONE TABLET BY MOUTH EVERY DAY, Disp: 90 tablet, Rfl: 1  Allergies:  Allergies  Allergen Reactions   Pregabalin Other (See Comments)    Elevated LFTs   Lipitor [Atorvastatin Calcium] Other (See Comments)    Myalgias.    Lisinopril Nausea Only   Merbromin Hives   Thimerosal (Thiomersal) Hives    Past Medical History, Surgical history, Social history, and Family History were reviewed and updated.  Review of Systems: Review of Systems  Constitutional: Negative.   HENT:  Negative.    Eyes: Negative.   Respiratory: Negative.    Cardiovascular: Negative.   Gastrointestinal:  Negative.   Endocrine: Negative.   Genitourinary: Negative.    Musculoskeletal:  Positive for back pain.  Skin: Negative.   Neurological: Negative.   Hematological: Negative.   Psychiatric/Behavioral: Negative.      Physical Exam:  height is 5\' 8"  (1.727 m) and weight is 190 lb (86.2 kg). His oral temperature is 97.7 F (36.5 C). His blood pressure is 150/66 (abnormal) and his pulse is 70. His respiration is 19 and oxygen saturation is 99%.   Wt Readings from Last 3 Encounters:  08/05/22 190 lb (86.2 kg)  06/13/22 192 lb (87.1 kg)  05/16/22 189 lb (85.7 kg)    Physical Exam Vitals reviewed.  HENT:     Head: Normocephalic and atraumatic.  Eyes:     Pupils: Pupils are equal, round, and reactive to light.  Cardiovascular:     Rate and Rhythm: Normal rate and regular rhythm.     Heart sounds: Normal heart sounds.  Pulmonary:     Effort: Pulmonary effort is normal.     Breath sounds: Normal breath sounds.  Abdominal:     General: Bowel sounds are normal.     Palpations: Abdomen is soft.  Musculoskeletal:        General: No tenderness or deformity. Normal range of motion.     Cervical back: Normal range of motion.  Lymphadenopathy:     Cervical: No cervical adenopathy.  Skin:    General: Skin is warm and dry.     Findings: No erythema or rash.  Neurological:     Mental Status: He is alert and oriented to person, place, and time.  Psychiatric:        Behavior: Behavior normal.        Thought Content: Thought content normal.        Judgment: Judgment normal.     Lab Results  Component Value Date   WBC 5.2 08/05/2022   HGB 10.7 (L) 08/05/2022   HCT 32.1 (L) 08/05/2022   MCV 92.5 08/05/2022   PLT 227 08/05/2022     Chemistry      Component Value Date/Time   NA 139 08/05/2022 0840   NA 137 04/01/2013 0000   K 4.2 08/05/2022 0840   CL 106 08/05/2022 0840   CO2 25 08/05/2022 0840   BUN 16 08/05/2022 0840   CREATININE 1.03 08/05/2022 0840   CREATININE 1.44 (H)  01/11/2022 0945   GLU 101 04/01/2013 0000      Component Value Date/Time   CALCIUM 9.6 08/05/2022 0840   ALKPHOS 56 08/05/2022 0840   AST 13 (L) 08/05/2022 0840   ALT 9 08/05/2022 0840   BILITOT 0.4 08/05/2022 0840      Impression and Plan: Walter Wilkerson is  a very nice 72 year old white male.  He is incredibly interesting to talk to.  He comes in with his wife.  He served in the [employer].  As far as the lung nodule are concerned, I think we just hold off on doing a CT scan probably for about 6 months.  Again I am more worried about the anemia.  We will recheck his erythropoietin level.  I will also check a iron studies and B12 level on him.  He is really not symptomatic right now.  I think we just watch this for right now.  I would like to get him back in 3 months.  I will try to get him back before the Holiday season starts to make sure that we optimize his blood count.   Josph Macho, MD 7/26/20249:39 AM

## 2022-08-11 HISTORY — PX: UPPER GI ENDOSCOPY: SHX6162

## 2022-09-01 DIAGNOSIS — M25561 Pain in right knee: Secondary | ICD-10-CM | POA: Diagnosis not present

## 2022-09-02 DIAGNOSIS — K315 Obstruction of duodenum: Secondary | ICD-10-CM | POA: Diagnosis not present

## 2022-09-02 DIAGNOSIS — K2950 Unspecified chronic gastritis without bleeding: Secondary | ICD-10-CM | POA: Diagnosis not present

## 2022-09-02 DIAGNOSIS — K222 Esophageal obstruction: Secondary | ICD-10-CM | POA: Diagnosis not present

## 2022-09-02 DIAGNOSIS — K259 Gastric ulcer, unspecified as acute or chronic, without hemorrhage or perforation: Secondary | ICD-10-CM | POA: Diagnosis not present

## 2022-09-02 DIAGNOSIS — R1013 Epigastric pain: Secondary | ICD-10-CM | POA: Diagnosis not present

## 2022-09-02 LAB — HM COLONOSCOPY

## 2022-09-09 ENCOUNTER — Encounter: Payer: Self-pay | Admitting: Physician Assistant

## 2022-09-09 ENCOUNTER — Other Ambulatory Visit: Payer: Self-pay | Admitting: Sports Medicine

## 2022-09-09 ENCOUNTER — Ambulatory Visit (INDEPENDENT_AMBULATORY_CARE_PROVIDER_SITE_OTHER): Payer: Medicare Other | Admitting: Physician Assistant

## 2022-09-09 VITALS — BP 169/75 | HR 80 | Ht 68.0 in | Wt 192.5 lb

## 2022-09-09 DIAGNOSIS — M130 Polyarthritis, unspecified: Secondary | ICD-10-CM

## 2022-09-09 DIAGNOSIS — M47816 Spondylosis without myelopathy or radiculopathy, lumbar region: Secondary | ICD-10-CM

## 2022-09-09 DIAGNOSIS — R1013 Epigastric pain: Secondary | ICD-10-CM | POA: Diagnosis not present

## 2022-09-09 DIAGNOSIS — D539 Nutritional anemia, unspecified: Secondary | ICD-10-CM | POA: Diagnosis not present

## 2022-09-09 DIAGNOSIS — I10 Essential (primary) hypertension: Secondary | ICD-10-CM | POA: Diagnosis not present

## 2022-09-09 MED ORDER — GABAPENTIN 300 MG PO CAPS
ORAL_CAPSULE | ORAL | 1 refills | Status: DC
Start: 2022-09-09 — End: 2022-12-20

## 2022-09-09 MED ORDER — HYDRALAZINE HCL 100 MG PO TABS: 100.0000 mg | ORAL_TABLET | Freq: Three times a day (TID) | ORAL | 1 refills | Status: DC

## 2022-09-09 NOTE — Patient Instructions (Addendum)
Will send message for Dr. Karie Schwalbe to order another epidural injection Increase hydralazine to 100mg  three times a day sent new rx Increase gabapentin to 1-2 tablets up to three times a day

## 2022-09-09 NOTE — Progress Notes (Signed)
Established Patient Office Visit  Subjective   Patient ID: Lynn Wagaman, male    DOB: [birth year]  Age: 72 y.o. MRN: [id number]  No chief complaint on file.   HPI Pt is a 72 yo male with HTN, OA and chronic pain, lumbar spondylosis, hypothyroidism, anemia, epigastric pain who presents to the clinic to go over medications and "make sure he is on the right track".   Pt is not checking BP at home. He denies any CP, palpitations, headaches or vision changes. He is taking norvasc, diovan HCT and hydralazine regularly.   He recently had EGD which found peptic ulcer likely due to NSAID and alcohol use. He is not due for colonoscopy.  He is not taking diclofenac at this time. He is wondering if he can take anything. He does take prednisone 1mg  daily. He is taking pantoprazole daily. Epigastric pain is better.   He did get epidural injection and helped for about 3 weeks. He would like to try another one for his lumbar back pain.     .. Active Ambulatory Problems    Diagnosis Date Noted   Acquired hypothyroidism 08/11/2014   Essential hypertension 08/11/2014   Fatigue 11/04/2016   Epigastric pain 04/20/2017   Chronic pain syndrome 05/29/2018   Muscle cramps 09/03/2019   Lumbar spondylosis 12/03/2019   Right hand pain 12/03/2019   Primary osteoarthritis involving multiple joints 12/03/2019   Allergic sinusitis 06/03/2020   Polyarthritis 02/16/2021   Hand cramp 04/14/2021   Thrombocytopenia (HCC) 01/17/2022   Abnormal CT scan of lung 03/08/2022   Centrilobular emphysema (HCC) 03/08/2022   Intermittent chest pain 03/08/2022   Palpitations 03/08/2022   Newly recognized heart murmur 03/08/2022   Multiple lung nodules on CT 03/25/2022   Abnormal CT scan, lung 03/25/2022   History of lung cancer 03/25/2022   Thoracic aortic aneurysm 40 mm very of CT from 2024 04/20/2022   Neck pain on right side 06/14/2022   Macrocytic anemia 09/09/2022   Resolved Ambulatory Problems    Diagnosis  Date Noted   Hyperlipidemia 08/11/2014   Peripheral neuropathy (HCC) 08/11/2014   Non-small cell cancer of right lung (HCC) 08/11/2014   Acute bronchitis 01/08/2015   Shoulder impingement 02/24/2015   Hyperglycemia 04/14/2015   Prediabetes 04/15/2015   Dermatitis 10/18/2015   Cyst of pharynx 01/12/2016   Aortic atherosclerosis (HCC) 01/12/2016   Elevated serum creatinine 02/10/2016   Stress at work 11/04/2016   Adhesive capsulitis 12/27/2016   Nausea 04/20/2017   PUD (peptic ulcer disease) 04/20/2017   Metatarsal stress fracture of right foot 01/15/2018   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 03/02/2018   Injury of right knee 09/03/2019   Elevated liver enzymes 09/03/2019   Chronic pain of right knee 12/03/2019   NSAID induced gastritis 12/03/2019   Leg cramps 04/14/2021   EKG abnormalities 08/27/2021   Gastroesophageal reflux disease 08/27/2021   CAD (coronary artery disease) 03/25/2022   Aortic aneurysm, abdominal (HCC) 03/25/2022   Past Medical History:  Diagnosis Date   Hypertension    Lung cancer (HCC)    Thyroid disease      ROS See HPI.    Objective:     BP (!) 169/75   Pulse 80   Ht 5\' 8"  (1.727 m)   Wt 192 lb 8 oz (87.3 kg)   SpO2 95%   BMI 29.27 kg/m  BP Readings from Last 3 Encounters:  09/09/22 (!) 169/75  08/05/22 (!) 150/66  06/13/22 139/71   Wt  Readings from Last 3 Encounters:  09/09/22 192 lb 8 oz (87.3 kg)  08/05/22 190 lb (86.2 kg)  06/13/22 192 lb (87.1 kg)      Physical Exam Constitutional:      Appearance: Normal appearance.  HENT:     Head: Normocephalic.  Cardiovascular:     Rate and Rhythm: Normal rate and regular rhythm.  Pulmonary:     Effort: Pulmonary effort is normal.  Musculoskeletal:     Right lower leg: No edema.     Left lower leg: No edema.  Neurological:     General: No focal deficit present.     Mental Status: He is alert and oriented to person, place, and time.  Psychiatric:        Mood and  Affect: Mood normal.          Assessment & Plan:  [name] KitchenDiagnoses and all orders for this visit:  Essential hypertension -     hydrALAZINE (APRESOLINE) 100 MG tablet; Take 1 tablet (100 mg total) by mouth 3 (three) times daily.  Lumbar spondylosis -     gabapentin (NEURONTIN) 300 MG capsule; Take 1-2 tablets up to three times a day.  Polyarthritis  Macrocytic anemia  Epigastric pain   BP not to goal, 2nd recheck not much better Keep log at home continue norvasc and diovan/hct Increase hydralazine to 100mg  TID Recheck BP nurse visit in 4 weeks  Continue prednisone 1mg  for now but stop all other NsAIDs Ok to stop famotidine Continue pantoprazole daily  Pt would like another epidural injection Will route note to Dr. Karie Schwalbe     Return in about 4 weeks (around 10/07/2022) for nurse visit BP check.    Tandy Gaw, PA-C

## 2022-09-09 NOTE — Addendum Note (Signed)
Addended by: Monica Becton on: 09/09/2022 04:52 PM   Modules accepted: Orders

## 2022-09-19 ENCOUNTER — Ambulatory Visit
Admission: RE | Admit: 2022-09-19 | Discharge: 2022-09-19 | Disposition: A | Discharge status: Home / Self Care | Payer: Medicare Other | Source: Ambulatory Visit | Attending: Sports Medicine | Admitting: Sports Medicine

## 2022-09-19 DIAGNOSIS — M47816 Spondylosis without myelopathy or radiculopathy, lumbar region: Secondary | ICD-10-CM

## 2022-09-19 DIAGNOSIS — M47817 Spondylosis without myelopathy or radiculopathy, lumbosacral region: Secondary | ICD-10-CM | POA: Diagnosis not present

## 2022-09-19 MED ORDER — METHYLPREDNISOLONE ACETATE 40 MG/ML INJ SUSP (RADIOLOG
80.0000 mg | Freq: Once | INTRAMUSCULAR | Status: AC
  Administered 2022-09-19: 80 mg via EPIDURAL

## 2022-09-19 MED ORDER — IOPAMIDOL (ISOVUE-M 200) INJECTION 41%
1.0000 mL | Freq: Once | INTRAMUSCULAR | Status: AC
  Administered 2022-09-19: 1 mL via EPIDURAL

## 2022-09-19 NOTE — Discharge Instructions (Signed)

## 2022-09-27 IMAGING — DX DG HIP (WITH OR WITHOUT PELVIS) 2-3V*L*
3 series · 3 of 3 positions shown · non-contrast
Comparison: None.

CLINICAL DATA: Left hip pain over the last 3 months.

EXAM:
DG HIP (WITH OR WITHOUT PELVIS) 2-3V LEFT

[pelvis ap]
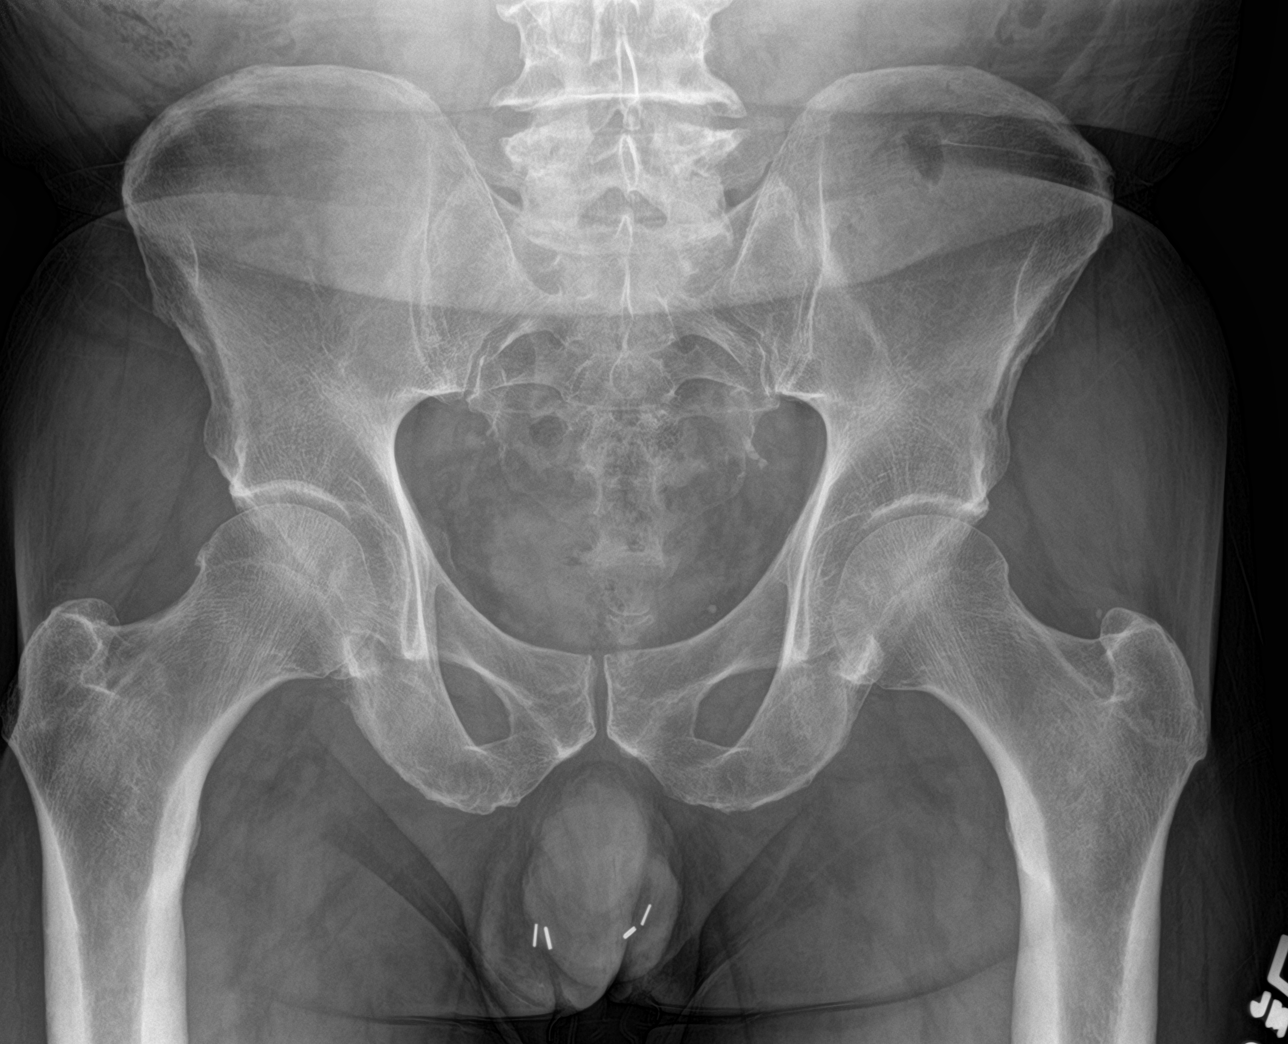

[hip ap]
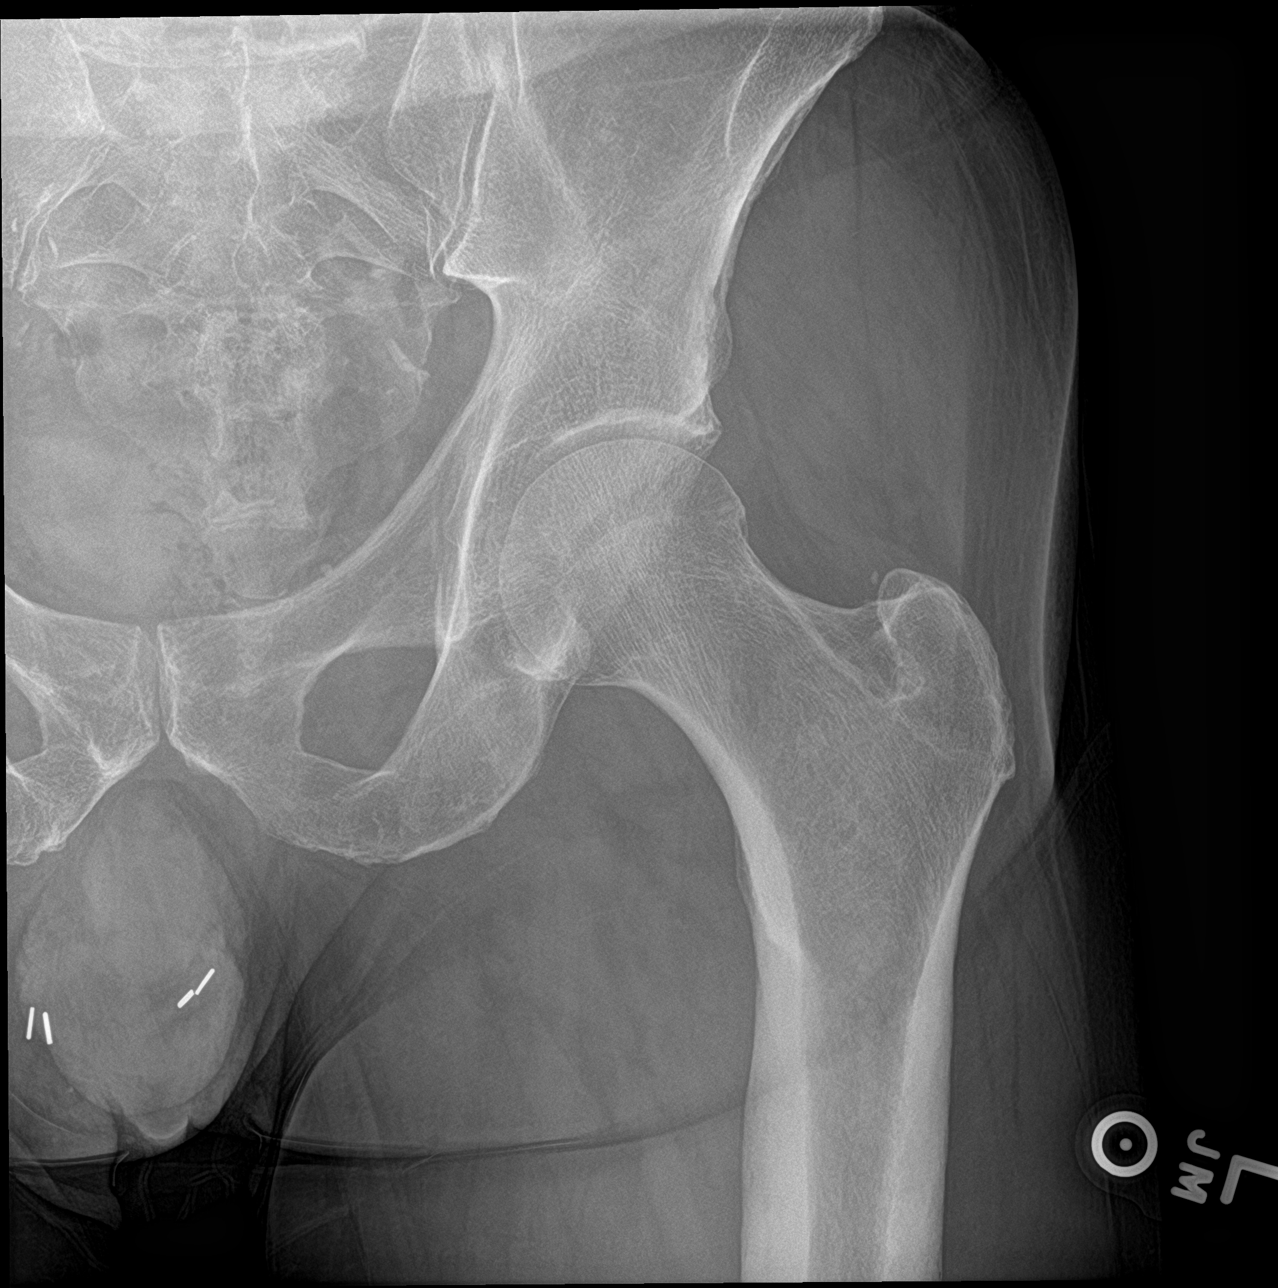

[hip frog leg]
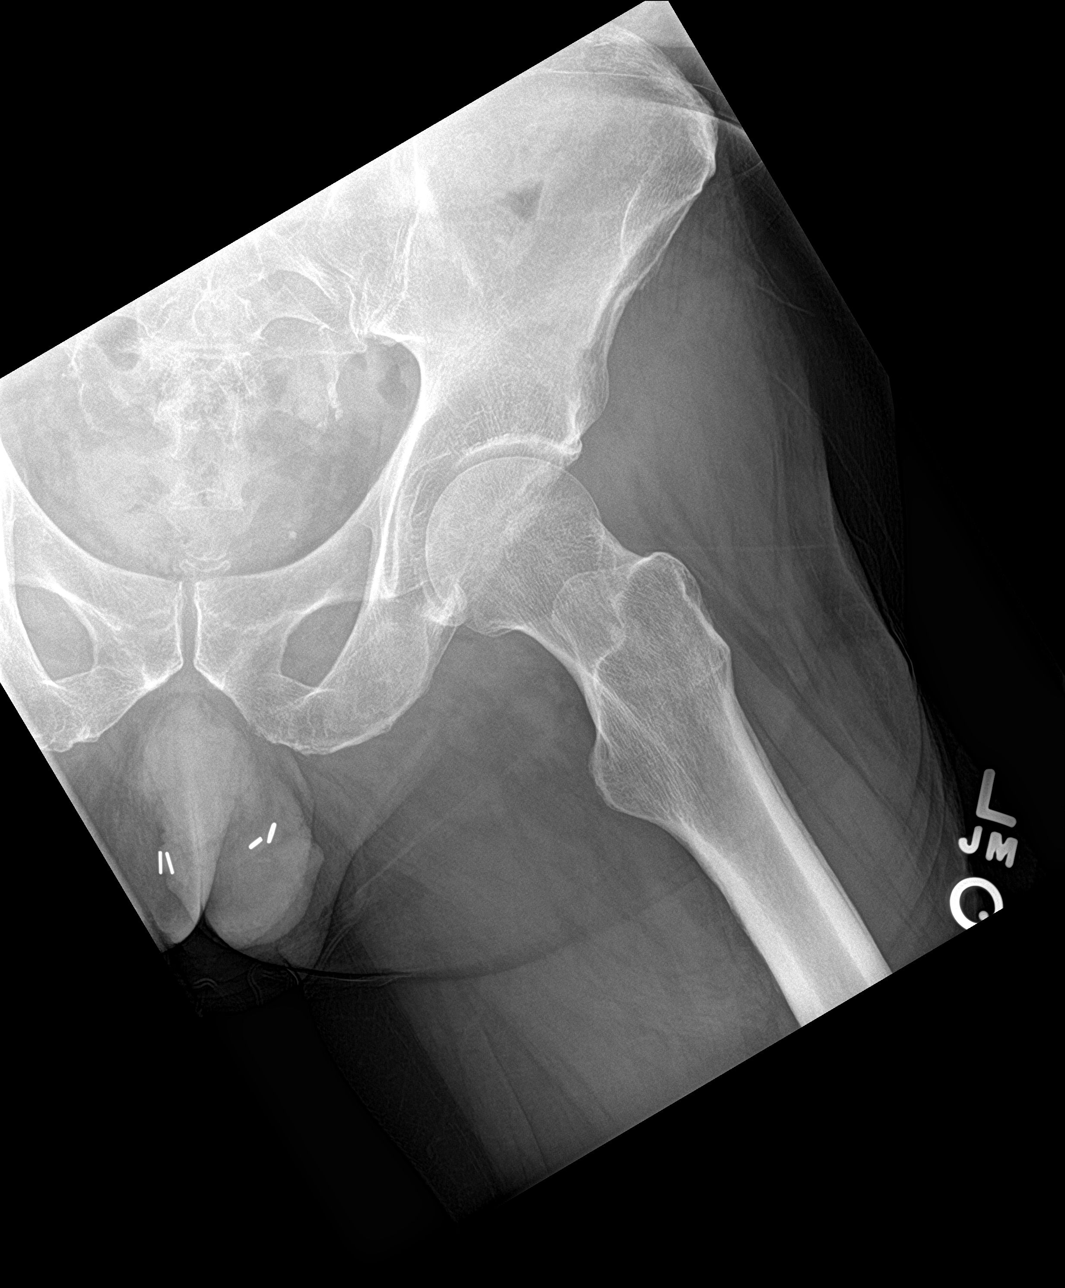

[3 of 3 positions shown; findings below may reference images not displayed]

FINDINGS: No evidence of hip joint degenerative disease. Joint space is
normal. No osteophytes. No sclerotic areas or cystic areas.
Sacroiliac joints, right hip and symphysis pubis also normal.
IMPRESSION: Normal radiographs.

## 2022-10-07 ENCOUNTER — Ambulatory Visit (INDEPENDENT_AMBULATORY_CARE_PROVIDER_SITE_OTHER): Payer: Medicare Other

## 2022-10-07 VITALS — BP 123/73 | HR 93

## 2022-10-07 DIAGNOSIS — I10 Essential (primary) hypertension: Secondary | ICD-10-CM | POA: Diagnosis not present

## 2022-10-07 NOTE — Progress Notes (Signed)
Patient returned today for a BP check, patient is currently taking Amlodipine 5 mg daily and valsartan-hydrochlorothiazide 320-25 mg daily he denies CP,dizziness and palpitations but he does complain of SOB but he states that's due to him having a 3rd of his lung his BP was 159/71 and after sitting for 10 minutes we git it down to 123/73 so at this time he is at goal with that last BP reading so no changes for now but I did recommend him following up in 2 weeks for another BP check and keep taking his BP at home and he does have a follow up coming up soon with Lesly Rubenstein so he will keep that appointment.

## 2022-10-10 ENCOUNTER — Encounter: Payer: Self-pay | Admitting: Physician Assistant

## 2022-10-13 ENCOUNTER — Other Ambulatory Visit: Payer: Self-pay | Admitting: Physician Assistant

## 2022-10-13 DIAGNOSIS — M130 Polyarthritis, unspecified: Secondary | ICD-10-CM

## 2022-10-17 ENCOUNTER — Ambulatory Visit (INDEPENDENT_AMBULATORY_CARE_PROVIDER_SITE_OTHER): Payer: Medicare Other | Admitting: Physician Assistant

## 2022-10-17 ENCOUNTER — Encounter: Payer: Self-pay | Admitting: Physician Assistant

## 2022-10-17 VITALS — BP 117/48 | HR 94 | Ht 68.5 in | Wt 193.5 lb

## 2022-10-17 DIAGNOSIS — L739 Follicular disorder, unspecified: Secondary | ICD-10-CM | POA: Diagnosis not present

## 2022-10-17 MED ORDER — CEPHALEXIN 500 MG PO CAPS: 500.0000 mg | ORAL_CAPSULE | Freq: Two times a day (BID) | ORAL | 0 refills | Status: DC

## 2022-10-17 MED ORDER — MUPIROCIN 2 % EX OINT: TOPICAL_OINTMENT | CUTANEOUS | 0 refills | Status: DC

## 2022-10-17 NOTE — Progress Notes (Unsigned)
Acute Office Visit  Subjective:     Patient ID: Walter Wilkerson, male    DOB: 03/01/1950, 72 y.o.   MRN: [id number]  Chief Complaint  Patient presents with   lesion scalp    Patient c/o  small "bumps" base of scalp that are tender to the touch x couple of weeks.     HPI  Patient is in today for bumps over occipital scalp that are tender for last week or so. At times they have gone away on their own but this time seem to not want to resolve this time. He has not really done anything to make them go away. He has put some neosporin on them at one point.   .. Active Ambulatory Problems    Diagnosis Date Noted   Acquired hypothyroidism 08/11/2014   Essential hypertension 08/11/2014   Fatigue 11/04/2016   Epigastric pain 04/20/2017   Chronic pain syndrome 05/29/2018   Muscle cramps 09/03/2019   Lumbar spondylosis 12/03/2019   Right hand pain 12/03/2019   Primary osteoarthritis involving multiple joints 12/03/2019   Allergic sinusitis 06/03/2020   Polyarthritis 02/16/2021   Hand cramp 04/14/2021   Thrombocytopenia (HCC) 01/17/2022   Abnormal CT scan of lung 03/08/2022   Centrilobular emphysema (HCC) 03/08/2022   Intermittent chest pain 03/08/2022   Palpitations 03/08/2022   Newly recognized heart murmur 03/08/2022   Multiple lung nodules on CT 03/25/2022   Abnormal CT scan, lung 03/25/2022   History of lung cancer 03/25/2022   Thoracic aortic aneurysm 40 mm very of CT from 2024 04/20/2022   Neck pain on right side 06/14/2022   Macrocytic anemia 09/09/2022   Resolved Ambulatory Problems    Diagnosis Date Noted   Hyperlipidemia 08/11/2014   Peripheral neuropathy (HCC) 08/11/2014   Non-small cell cancer of right lung (HCC) 08/11/2014   Acute bronchitis 01/08/2015   Shoulder impingement 02/24/2015   Hyperglycemia 04/14/2015   Prediabetes 04/15/2015   Dermatitis 10/18/2015   Cyst of pharynx 01/12/2016   Aortic atherosclerosis (HCC) 01/12/2016   Elevated serum creatinine  02/10/2016   Stress at work 11/04/2016   Adhesive capsulitis 12/27/2016   Nausea 04/20/2017   PUD (peptic ulcer disease) 04/20/2017   Metatarsal stress fracture of right foot 01/15/2018   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 03/02/2018   Injury of right knee 09/03/2019   Elevated liver enzymes 09/03/2019   Chronic pain of right knee 12/03/2019   NSAID induced gastritis 12/03/2019   Leg cramps 04/14/2021   EKG abnormalities 08/27/2021   Gastroesophageal reflux disease 08/27/2021   CAD (coronary artery disease) 03/25/2022   Aortic aneurysm, abdominal (HCC) 03/25/2022   Past Medical History:  Diagnosis Date   Hypertension    Lung cancer (HCC)    Thyroid disease      ROS See HPI.      Objective:    BP (!) 117/48   Pulse 94   Ht 5' 8.5" (1.74 m)   Wt 193 lb 8 oz (87.8 kg)   SpO2 96%   BMI 28.99 kg/m  BP Readings from Last 3 Encounters:  10/17/22 (!) 117/48  10/07/22 123/73  09/19/22 125/80   Wt Readings from Last 3 Encounters:  10/17/22 193 lb 8 oz (87.8 kg)  09/09/22 192 lb 8 oz (87.3 kg)  08/05/22 190 lb (86.2 kg)      Physical Exam 2 dime sized erythematous papules over occipital area of scalp in hair follicles with fine scales over top that are tender to touch  Assessment & Plan:  [name] KitchenMerlon Vozza" was seen today for lesion scalp.  Diagnoses and all orders for this visit:  Folliculitis -     cephALEXin (KEFLEX) 500 MG capsule; Take 1 capsule (500 mg total) by mouth 2 (two) times daily. -     mupirocin ointment (BACTROBAN) 2 %; Apply to back of scalp where bumps are as needed for up to 5 days.   Keflex to start Bactroban to use as neede HO to discuss prevention Follow up as needed or if symptoms persist  Tandy Gaw, PA-C

## 2022-10-17 NOTE — Patient Instructions (Signed)
Folliculitis  Folliculitis occurs when hair follicles become inflamed. A hair follicle is a tiny opening in your skin where your hair grows from. This condition often occurs on the scalp, thighs, legs, back, and buttocks but can happen anywhere on the body. What are the causes? A common cause of this condition is an infection from bacteria. The type of folliculitis caused by bacteria can last a long time or go away and come back. The bacteria can live anywhere on your skin. They are often found in the nostrils. Other causes may include: An infection from a fungus. An infection from a virus. Your skin touching some chemicals, such as oils and tars. Shaving or waxing. Greasy ointments or creams put on the skin. What increases the risk? You are more likely to develop this condition if: Your body has a weak disease-fighting system (immune system). You have diabetes. You are obese. What are the signs or symptoms? Symptoms of this condition include: Redness. Soreness. Swelling. Itching. Small white or yellow, itchy spots filled with pus (pustules) that appear over a red area. If the infection goes deep into the follicle, these may turn into a boil (furuncle). A group of boils (carbuncle). These tend to form in hairy, sweaty areas of the body. How is this diagnosed? This condition is diagnosed with a skin exam. Your health care provider may take a sample of one of the pustules or boils to test in a lab. How is this treated? This condition may be treated by: Putting a warm, wet cloth (warm compress) on the affected areas. Taking antibiotics or applying them to the skin. Applying or bathing with a solution that kills germs (antiseptic). Taking an over-the-counter medicine. This can help with itching. Having a procedure to drain pustules or boils. This may be done if a pustule or boil contains a lot of pus or fluid. Having laser hair removal. This may be done when the condition lasts for a  long time. Follow these instructions at home: Managing pain and swelling  If directed, apply heat to the affected area as often as told by your health care provider. Use the heat source that your health care provider recommends, such as a moist heat pack or a heating pad. Place a towel between your skin and the heat source. Leave the heat on for 20-30 minutes. If your skin turns bright red, remove the heat right away to prevent burns. The risk of burns is higher if you cannot feel pain, heat, or cold. General instructions Take over-the-counter and prescription medicines only as told by your health care provider. If you were prescribed antibiotics, take or apply them as told by your health care provider. Do not stop using the antibiotic even if you start to feel better. Check your irritated area every day for signs of infection. Check for: More redness, swelling, or pain. Fluid or blood. Warmth. Pus or a bad smell. Do not shave irritated skin. Keep all follow-up visits. Your health care provider will check if the treatments are helping. Contact a health care provider if: You have a fever. You have any signs of infection. Red streaks are spreading from the affected area. This information is not intended to replace advice given to you by your health care provider. Make sure you discuss any questions you have with your health care provider. Document Revised: 06/01/2021 Document Reviewed: 06/01/2021 Elsevier Patient Education  2024 [employer].

## 2022-10-18 ENCOUNTER — Encounter: Payer: Self-pay | Admitting: Physician Assistant

## 2022-10-21 ENCOUNTER — Other Ambulatory Visit: Payer: Self-pay | Admitting: Cardiology

## 2022-10-21 ENCOUNTER — Ambulatory Visit: Payer: Medicare Other

## 2022-10-31 ENCOUNTER — Other Ambulatory Visit: Payer: Self-pay | Admitting: Physician Assistant

## 2022-10-31 DIAGNOSIS — I10 Essential (primary) hypertension: Secondary | ICD-10-CM

## 2022-11-02 ENCOUNTER — Other Ambulatory Visit: Payer: Self-pay | Admitting: Physician Assistant

## 2022-11-02 DIAGNOSIS — I10 Essential (primary) hypertension: Secondary | ICD-10-CM

## 2022-11-04 ENCOUNTER — Ambulatory Visit (INDEPENDENT_AMBULATORY_CARE_PROVIDER_SITE_OTHER): Payer: Medicare Other | Admitting: Physician Assistant

## 2022-11-04 ENCOUNTER — Encounter: Payer: Self-pay | Admitting: Physician Assistant

## 2022-11-04 VITALS — BP 118/69 | HR 98 | Temp 99.3°F | Ht 68.5 in | Wt 191.2 lb

## 2022-11-04 DIAGNOSIS — R059 Cough, unspecified: Secondary | ICD-10-CM | POA: Diagnosis not present

## 2022-11-04 DIAGNOSIS — J189 Pneumonia, unspecified organism: Secondary | ICD-10-CM | POA: Diagnosis not present

## 2022-11-04 DIAGNOSIS — R6889 Other general symptoms and signs: Secondary | ICD-10-CM | POA: Diagnosis not present

## 2022-11-04 DIAGNOSIS — J358 Other chronic diseases of tonsils and adenoids: Secondary | ICD-10-CM

## 2022-11-04 LAB — POCT INFLUENZA A/B
Influenza A, POC: NEGATIVE
Influenza B, POC: NEGATIVE

## 2022-11-04 LAB — POC COVID19 BINAXNOW: SARS Coronavirus 2 Ag: NEGATIVE

## 2022-11-04 MED ORDER — PREDNISONE 20 MG PO TABS: ORAL_TABLET | ORAL | 0 refills | Status: DC

## 2022-11-04 MED ORDER — DOXYCYCLINE HYCLATE 100 MG PO TABS: 100.0000 mg | ORAL_TABLET | Freq: Two times a day (BID) | ORAL | 0 refills | Status: DC

## 2022-11-04 NOTE — Progress Notes (Signed)
Acute Office Visit  Subjective:     Patient ID: Walter Wilkerson, male    DOB: 12-05-1950, 72 y.o.   MRN: [id number]  Chief Complaint  Patient presents with   Cough    C/o  muscle/joint pains, headache, shortness of breath, wheezing , cough producing clear to gray phlegm x 1  week. denies fever. 2 home covid tests negative.     HPI Patient is in today with Flu like symptoms for 1 week. He took 2 home covid test and were negative. He has COPD and his breathing seems to be getting worse. He has productive cough, body aches, fatigue, SOB, chest tightness. He is using his albuterol inhaler daily. No other sick contacts.   .. Active Ambulatory Problems    Diagnosis Date Noted   Acquired hypothyroidism 08/11/2014   Essential hypertension 08/11/2014   Fatigue 11/04/2016   Epigastric pain 04/20/2017   Chronic pain syndrome 05/29/2018   Muscle cramps 09/03/2019   Lumbar spondylosis 12/03/2019   Right hand pain 12/03/2019   Primary osteoarthritis involving multiple joints 12/03/2019   Allergic sinusitis 06/03/2020   Polyarthritis 02/16/2021   Hand cramp 04/14/2021   Thrombocytopenia (HCC) 01/17/2022   Abnormal CT scan of lung 03/08/2022   Centrilobular emphysema (HCC) 03/08/2022   Intermittent chest pain 03/08/2022   Palpitations 03/08/2022   Newly recognized heart murmur 03/08/2022   Multiple lung nodules on CT 03/25/2022   Abnormal CT scan, lung 03/25/2022   History of lung cancer 03/25/2022   Thoracic aortic aneurysm 40 mm very of CT from 2024 04/20/2022   Neck pain on right side 06/14/2022   Macrocytic anemia 09/09/2022   Resolved Ambulatory Problems    Diagnosis Date Noted   Hyperlipidemia 08/11/2014   Peripheral neuropathy (HCC) 08/11/2014   Non-small cell cancer of right lung (HCC) 08/11/2014   Acute bronchitis 01/08/2015   Shoulder impingement 02/24/2015   Hyperglycemia 04/14/2015   Prediabetes 04/15/2015   Dermatitis 10/18/2015   Cyst of pharynx 01/12/2016    Aortic atherosclerosis (HCC) 01/12/2016   Elevated serum creatinine 02/10/2016   Stress at work 11/04/2016   Adhesive capsulitis 12/27/2016   Nausea 04/20/2017   PUD (peptic ulcer disease) 04/20/2017   Metatarsal stress fracture of right foot 01/15/2018   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 03/02/2018   Injury of right knee 09/03/2019   Elevated liver enzymes 09/03/2019   Chronic pain of right knee 12/03/2019   NSAID induced gastritis 12/03/2019   Leg cramps 04/14/2021   EKG abnormalities 08/27/2021   Gastroesophageal reflux disease 08/27/2021   CAD (coronary artery disease) 03/25/2022   Aortic aneurysm, abdominal (HCC) 03/25/2022   Past Medical History:  Diagnosis Date   Hypertension    Lung cancer (HCC)    Thyroid disease     ROS  See HPI.     Objective:    BP 118/69   Pulse 98   Temp 99.3 F (37.4 C)   Ht 5' 8.5" (1.74 m)   Wt 191 lb 4 oz (86.8 kg)   SpO2 95%   BMI 28.66 kg/m  BP Readings from Last 3 Encounters:  11/04/22 118/69  10/17/22 (!) 117/48  10/07/22 123/73   Wt Readings from Last 3 Encounters:  11/04/22 191 lb 4 oz (86.8 kg)  10/17/22 193 lb 8 oz (87.8 kg)  09/09/22 192 lb 8 oz (87.3 kg)  .[name] Kitchen Results for orders placed or performed in visit on 11/04/22  POC COVID-19  Result Value Ref Range  SARS Coronavirus 2 Ag Negative Negative  POCT Influenza A/B  Result Value Ref Range   Influenza A, POC Negative Negative   Influenza B, POC Negative Negative       Physical Exam Constitutional:      Appearance: He is ill-appearing.  HENT:     Head: Normocephalic.     Right Ear: Tympanic membrane, ear canal and external ear normal. There is no impacted cerumen.     Left Ear: Tympanic membrane, ear canal and external ear normal. There is no impacted cerumen.     Nose: Nose normal.     Mouth/Throat:     Mouth: Mucous membranes are moist.  Eyes:     Conjunctiva/sclera: Conjunctivae normal.     Pupils: Pupils are equal, round, and reactive  to light.  Cardiovascular:     Rate and Rhythm: Normal rate and regular rhythm.  Pulmonary:     Comments: Cough on deep breathing Crackles left upper lungs Musculoskeletal:     Cervical back: Normal range of motion and neck supple. No tenderness.  Lymphadenopathy:     Cervical: No cervical adenopathy.  Neurological:     General: No focal deficit present.     Mental Status: He is alert and oriented to person, place, and time.  Psychiatric:        Mood and Affect: Mood normal.          Assessment & Plan:  [name] KitchenShingo Deignan" was seen today for cough.  Diagnoses and all orders for this visit:  Community acquired pneumonia of left upper lobe of lung -     doxycycline (VIBRA-TABS) 100 MG tablet; Take 1 tablet (100 mg total) by mouth 2 (two) times daily. -     predniSONE (DELTASONE) 20 MG tablet; Take 3 tablets for 3 days, take 2 tablets for 3 days, take 1 tablet for 3 days, take 1/2 tablet for 4 days.  Cough, unspecified type -     POC COVID-19 -     POCT Influenza A/B  Flu-like symptoms -     POC COVID-19 -     POCT Influenza A/B   Vitals stable today Negative flu and covid today Pulse ox is down to 95 percent Pt has COPD Crackles in left upper lung Will treat for pneumonia with doxycycline and prednisone Rest and hydrate Follow up if worsening or not feeling better  Tandy Gaw, PA-C

## 2022-11-04 NOTE — Patient Instructions (Signed)

## 2022-11-07 ENCOUNTER — Inpatient Hospital Stay: Payer: Medicare Other | Admitting: Hematology & Oncology

## 2022-11-07 ENCOUNTER — Inpatient Hospital Stay: Payer: Medicare Other

## 2022-11-07 DIAGNOSIS — J358 Other chronic diseases of tonsils and adenoids: Secondary | ICD-10-CM | POA: Insufficient documentation

## 2022-11-07 NOTE — Addendum Note (Signed)
Addended by: Jomarie Longs on: 11/07/2022 09:22 AM   Modules accepted: Orders

## 2022-11-08 ENCOUNTER — Other Ambulatory Visit: Payer: Self-pay | Admitting: Physician Assistant

## 2022-11-21 ENCOUNTER — Ambulatory Visit (INDEPENDENT_AMBULATORY_CARE_PROVIDER_SITE_OTHER): Payer: Medicare Other | Admitting: Family Medicine

## 2022-11-21 ENCOUNTER — Inpatient Hospital Stay: Payer: Medicare Other | Attending: Hematology & Oncology

## 2022-11-21 ENCOUNTER — Encounter: Payer: Self-pay | Admitting: Family Medicine

## 2022-11-21 ENCOUNTER — Inpatient Hospital Stay: Payer: Medicare Other | Admitting: Hematology & Oncology

## 2022-11-21 ENCOUNTER — Ambulatory Visit: Payer: Medicare Other

## 2022-11-21 VITALS — BP 90/52 | HR 91 | Temp 98.4°F | Resp 20 | Ht 68.5 in | Wt 189.4 lb

## 2022-11-21 DIAGNOSIS — R918 Other nonspecific abnormal finding of lung field: Secondary | ICD-10-CM | POA: Diagnosis not present

## 2022-11-21 DIAGNOSIS — Z87891 Personal history of nicotine dependence: Secondary | ICD-10-CM | POA: Insufficient documentation

## 2022-11-21 DIAGNOSIS — R770 Abnormality of albumin: Secondary | ICD-10-CM | POA: Insufficient documentation

## 2022-11-21 DIAGNOSIS — D696 Thrombocytopenia, unspecified: Secondary | ICD-10-CM | POA: Insufficient documentation

## 2022-11-21 DIAGNOSIS — D509 Iron deficiency anemia, unspecified: Secondary | ICD-10-CM | POA: Insufficient documentation

## 2022-11-21 DIAGNOSIS — R0602 Shortness of breath: Secondary | ICD-10-CM | POA: Insufficient documentation

## 2022-11-21 DIAGNOSIS — R9389 Abnormal findings on diagnostic imaging of other specified body structures: Secondary | ICD-10-CM

## 2022-11-21 DIAGNOSIS — R059 Cough, unspecified: Secondary | ICD-10-CM | POA: Diagnosis not present

## 2022-11-21 DIAGNOSIS — J189 Pneumonia, unspecified organism: Secondary | ICD-10-CM

## 2022-11-21 DIAGNOSIS — J929 Pleural plaque without asbestos: Secondary | ICD-10-CM | POA: Diagnosis not present

## 2022-11-21 HISTORY — DX: Pneumonia, unspecified organism: J18.9

## 2022-11-21 MED ORDER — AZITHROMYCIN 250 MG PO TABS: ORAL_TABLET | ORAL | 0 refills | Status: DC

## 2022-11-21 MED ORDER — AMOXICILLIN-POT CLAVULANATE 875-125 MG PO TABS: 1.0000 | ORAL_TABLET | Freq: Two times a day (BID) | ORAL | 0 refills | Status: DC

## 2022-11-21 NOTE — Assessment & Plan Note (Signed)
Ongoing shortness of breath worse with exertion. Will get d-dimer. If positive will get CT angio to rule out PE.

## 2022-11-21 NOTE — Progress Notes (Signed)
Established Patient Office Visit  Subjective   Patient ID: Walter Wilkerson, male    DOB: 1950-11-01  Age: 72 y.o. MRN: [id number]  Chief Complaint  Patient presents with   Cough    Patient states that he has a persistent cough that he has been treated for but states that it still has not stopped he states that he was told by PCP that it could be Pneumonia. Patient complains of shortness of breath, fatigue, and body soreness. Was tested for both Covid and Flu both were negative .    HPI  Presents today for an acute visit with complaint of Prescribed doxycycline 100 mg BID x 10 days (completed on 11/5) and prednisone per PCP on 10/25. Ongoing issues with shortness of breath with exertion. Has not had x-ray.  Symptoms have been present  2 weeks  Associated symptoms include: shortness of breath.  Pertinent negatives: no fever or chills Pain severity: with deep inspiration  Treatments tried include : doxy and prednisone  Treatment effective : not effective in relieving symptoms.  Sick contacts : none  History of lung cancer in past with surgery.    Review of Systems  Respiratory:  Positive for cough and shortness of breath.       Objective:     BP (!) 90/52   Pulse 91   Temp 98.4 F (36.9 C) (Oral)   Resp 20   Ht 5' 8.5" (1.74 m)   Wt 189 lb 6.4 oz (85.9 kg)   SpO2 94%   BMI 28.38 kg/m  BP Readings from Last 3 Encounters:  11/21/22 (!) 90/52  11/04/22 118/69  10/17/22 (!) 117/48      Physical Exam Vitals and nursing note reviewed.  Constitutional:      General: He is not in acute distress.    Appearance: Normal appearance. He is ill-appearing.  Cardiovascular:     Rate and Rhythm: Normal rate and regular rhythm.     Heart sounds: Normal heart sounds.  Pulmonary:     Effort: Pulmonary effort is normal. No respiratory distress.     Breath sounds: Rales (left lobe) present.  Skin:    General: Skin is warm and dry.  Neurological:     General: No focal deficit  present.     Mental Status: He is alert. Mental status is at baseline.  Psychiatric:        Mood and Affect: Mood normal.        Behavior: Behavior normal.        Thought Content: Thought content normal.        Judgment: Judgment normal.     No results found for any visits on 11/21/22.    The ASCVD Risk score (Arnett DK, et al., 2019) failed to calculate for the following reasons:   The valid HDL cholesterol range is 20 to 100 mg/dL    Assessment & Plan:   Problem List Items Addressed This Visit     Community acquired pneumonia of left upper lobe of lung - Primary    Treated per PCP with doxycycline 100 mg BID x 10 days. Has completed this course. Ongoing shortness of breath worse with exertion. History of lung cancer with lobectomy. Will send for chest x-ray today. Due to history of worsening in symptoms, Augmentin 875-125 mg BID x 10 days  and azithromycin 500 mg today, then followed by 250 mg x 4 days. Will check CBC and d-dimer today.   Follow-up with PCP if symptoms do  not resolve.       Relevant Medications   amoxicillin-clavulanate (AUGMENTIN) 875-125 MG tablet   azithromycin (ZITHROMAX) 250 MG tablet   Other Relevant Orders   DG Chest 2 View   CBC with Differential/Platelet   Shortness of breath    Ongoing shortness of breath worse with exertion. Will get d-dimer. If positive will get CT angio to rule out PE.        Relevant Orders   DG Chest 2 View   D-Dimer, Quantitative  Agrees with plan of care discussed.  Questions answered.   Return if symptoms worsen or fail to improve.    Novella Olive, FNP

## 2022-11-21 NOTE — Assessment & Plan Note (Addendum)
Treated per PCP with doxycycline 100 mg BID x 10 days. Has completed this course. Ongoing shortness of breath worse with exertion. History of lung cancer with lobectomy. Will send for chest x-ray today. Due to history of worsening in symptoms, Augmentin 875-125 mg BID x 10 days  and azithromycin 500 mg today, then followed by 250 mg x 4 days. Will check CBC and d-dimer today.   Follow-up with PCP if symptoms do not resolve.

## 2022-11-22 ENCOUNTER — Other Ambulatory Visit: Payer: Self-pay | Admitting: Family Medicine

## 2022-11-22 ENCOUNTER — Ambulatory Visit (HOSPITAL_BASED_OUTPATIENT_CLINIC_OR_DEPARTMENT_OTHER)
Admission: RE | Admit: 2022-11-22 | Discharge: 2022-11-22 | Disposition: A | Discharge status: Home / Self Care | Payer: Medicare Other | Source: Ambulatory Visit | Attending: Family Medicine | Admitting: Family Medicine

## 2022-11-22 ENCOUNTER — Ambulatory Visit: Payer: Medicare Other | Admitting: Family Medicine

## 2022-11-22 DIAGNOSIS — R7989 Other specified abnormal findings of blood chemistry: Secondary | ICD-10-CM

## 2022-11-22 DIAGNOSIS — R051 Acute cough: Secondary | ICD-10-CM | POA: Insufficient documentation

## 2022-11-22 DIAGNOSIS — I3139 Other pericardial effusion (noninflammatory): Secondary | ICD-10-CM | POA: Diagnosis not present

## 2022-11-22 DIAGNOSIS — R0602 Shortness of breath: Secondary | ICD-10-CM | POA: Diagnosis not present

## 2022-11-22 DIAGNOSIS — J439 Emphysema, unspecified: Secondary | ICD-10-CM | POA: Diagnosis not present

## 2022-11-22 DIAGNOSIS — I2510 Atherosclerotic heart disease of native coronary artery without angina pectoris: Secondary | ICD-10-CM | POA: Diagnosis not present

## 2022-11-22 DIAGNOSIS — J9811 Atelectasis: Secondary | ICD-10-CM | POA: Diagnosis not present

## 2022-11-22 HISTORY — DX: Other specified abnormal findings of blood chemistry: R79.89

## 2022-11-22 LAB — CBC WITH DIFFERENTIAL/PLATELET
Basophils Absolute: 0.1 10*3/uL (ref 0.0–0.2)
Basos: 1 %
EOS (ABSOLUTE): 0.1 10*3/uL (ref 0.0–0.4)
Eos: 1 %
Hematocrit: 27.6 % — ABNORMAL LOW (ref 37.5–51.0)
Hemoglobin: 8.7 g/dL — ABNORMAL LOW (ref 13.0–17.7)
Immature Grans (Abs): 0.1 10*3/uL (ref 0.0–0.1)
Immature Granulocytes: 1 %
Lymphocytes Absolute: 0.9 10*3/uL (ref 0.7–3.1)
Lymphs: 8 %
MCH: 27.8 pg (ref 26.6–33.0)
MCHC: 31.5 g/dL (ref 31.5–35.7)
MCV: 88 fL (ref 79–97)
Monocytes Absolute: 0.9 10*3/uL (ref 0.1–0.9)
Monocytes: 8 %
Neutrophils Absolute: 9 10*3/uL — ABNORMAL HIGH (ref 1.4–7.0)
Neutrophils: 81 %
RBC: 3.13 x10E6/uL — ABNORMAL LOW (ref 4.14–5.80)
RDW: 13.8 % (ref 11.6–15.4)
WBC: 11 10*3/uL — ABNORMAL HIGH (ref 3.4–10.8)

## 2022-11-22 LAB — D-DIMER, QUANTITATIVE: D-DIMER: 2.51 mg{FEU}/L — ABNORMAL HIGH (ref 0.00–0.49)

## 2022-11-22 MED ORDER — IOHEXOL 350 MG/ML SOLN
75.0000 mL | Freq: Once | INTRAVENOUS | Status: AC | PRN
  Administered 2022-11-22: 75 mL via INTRAVENOUS

## 2022-11-24 ENCOUNTER — Encounter: Payer: Self-pay | Admitting: Sports Medicine

## 2022-11-24 ENCOUNTER — Ambulatory Visit (INDEPENDENT_AMBULATORY_CARE_PROVIDER_SITE_OTHER): Payer: Medicare Other | Admitting: Sports Medicine

## 2022-11-24 VITALS — BP 99/61 | HR 95 | Ht 68.5 in | Wt 189.0 lb

## 2022-11-24 DIAGNOSIS — D509 Iron deficiency anemia, unspecified: Secondary | ICD-10-CM | POA: Diagnosis not present

## 2022-11-24 DIAGNOSIS — D696 Thrombocytopenia, unspecified: Secondary | ICD-10-CM | POA: Insufficient documentation

## 2022-11-24 DIAGNOSIS — D649 Anemia, unspecified: Secondary | ICD-10-CM

## 2022-11-24 DIAGNOSIS — I3139 Other pericardial effusion (noninflammatory): Secondary | ICD-10-CM

## 2022-11-24 LAB — CBC
Hematocrit: 26.2 % — ABNORMAL LOW (ref 37.5–51.0)
Hemoglobin: 8.8 g/dL — ABNORMAL LOW (ref 13.0–17.7)
MCH: 28.2 pg (ref 26.6–33.0)
MCHC: 33.6 g/dL (ref 31.5–35.7)
MCV: 84 fL (ref 79–97)
Platelets: 72 10*3/uL — CL (ref 150–450)
RBC: 3.12 x10E6/uL — ABNORMAL LOW (ref 4.14–5.80)
RDW: 15.3 % (ref 11.6–15.4)
WBC: 7.5 10*3/uL (ref 3.4–10.8)

## 2022-11-24 LAB — HEMOCCULT GUIAC POC 1CARD (OFFICE): Fecal Occult Blood, POC: NEGATIVE

## 2022-11-24 LAB — COMPREHENSIVE METABOLIC PANEL
ALT: 9 [IU]/L (ref 0–44)
AST: 14 [IU]/L (ref 0–40)
Albumin: 3.1 g/dL — ABNORMAL LOW (ref 3.8–4.8)
Alkaline Phosphatase: 101 [IU]/L (ref 44–121)
BUN/Creatinine Ratio: 12 (ref 10–24)
BUN: 15 mg/dL (ref 8–27)
Bilirubin Total: 0.4 mg/dL (ref 0.0–1.2)
CO2: 21 mmol/L (ref 20–29)
Calcium: 8.7 mg/dL (ref 8.6–10.2)
Chloride: 96 mmol/L (ref 96–106)
Creatinine, Ser: 1.24 mg/dL (ref 0.76–1.27)
Globulin, Total: 3.4 g/dL (ref 1.5–4.5)
Glucose: 96 mg/dL (ref 70–99)
Potassium: 4.2 mmol/L (ref 3.5–5.2)
Sodium: 133 mmol/L — ABNORMAL LOW (ref 134–144)
Total Protein: 6.5 g/dL (ref 6.0–8.5)
eGFR: 61 mL/min/{1.73_m2} (ref >59)

## 2022-11-24 LAB — SEDIMENTATION RATE: Sed Rate: 117 mm/h — ABNORMAL HIGH (ref 0–30)

## 2022-11-24 NOTE — Addendum Note (Signed)
Addended by: Monica Becton on: 11/24/2022 01:59 PM   Modules accepted: Orders

## 2022-11-24 NOTE — Assessment & Plan Note (Addendum)
This is a very pleasant 72 year old male with a history of a right lower lobectomy for lung cancer, approximately 3 to 4 weeks ago he recalls waking up with muscle aches and body aches, fatigue, cough. No melena, hematochezia, hematemesis. He did some COVID and flu testing which was negative, he did see his primary care provider who did antibiotics and steroids, neither of which helped significantly, he then presented with persistent symptoms of shortness of breath, achiness, coughing to our sister practice [facility], lab work was done that did show fairly severe anemia, which is a significant change since his prior CBC, ultimately a D-dimer was positive, CT angiography was performed as well as chest x-ray, this was negative for infiltrate, negative for pulmonary embolism. What was noted was his baseline COPD and a significant pericardial effusion, not present on an echo in May. He presents for additional advice today, he is still short of breath, his cough has improved. He is speaking full sentences and not using accessory muscles. Lung sounds are clear, cardiac sounds are distant. Abdominal exam is normal and he has 1+ pitting edema bilateral and symmetric. His weight is the same as his baseline, maybe a little bit less. Hemoccult was negative today. I think that his symptomatology is related more to his anemia and pericardial effusion and less of a primary pulmonary pathology. We will get a stat echocardiogram, I would like additional blood work to get a second data point on his anemia, we will also add a full anemia panel to determine the etiology including potential bone marrow failure, I would like cardiology to weigh in as well regarding his pericardial effusion.  We will have a low threshold for hospitalization if his symptoms worsen however he is stable today.

## 2022-11-24 NOTE — Progress Notes (Addendum)
Procedures performed today:    None.  Independent interpretation of notes and tests performed by another provider:   I personally reviewed his chest x-ray and chest CT, I do see evidence of a right lower lung lobectomy, moderate to severe COPD with emphysematous changes, as well as a moderate pericardial effusion.  Brief History, Exam, Impression, and Recommendations:    Pericardial effusion This is a very pleasant 72 year old male with a history of a right lower lobectomy for lung cancer, approximately 3 to 4 weeks ago he recalls waking up with muscle aches and body aches, fatigue, cough. No melena, hematochezia, hematemesis. He did some COVID and flu testing which was negative, he did see his primary care provider who did antibiotics and steroids, neither of which helped significantly, he then presented with persistent symptoms of shortness of breath, achiness, coughing to our sister practice [facility], lab work was done that did show fairly severe anemia, which is a significant change since his prior CBC, ultimately a D-dimer was positive, CT angiography was performed as well as chest x-ray, this was negative for infiltrate, negative for pulmonary embolism. What was noted was his baseline COPD and a significant pericardial effusion, not present on an echo in May. He presents for additional advice today, he is still short of breath, his cough has improved. He is speaking full sentences and not using accessory muscles. Lung sounds are clear, cardiac sounds are distant. Abdominal exam is normal and he has 1+ pitting edema bilateral and symmetric. His weight is the same as his baseline, maybe a little bit less. Hemoccult was negative today. I think that his symptomatology is related more to his anemia and pericardial effusion and less of a primary pulmonary pathology. We will get a stat echocardiogram, I would like additional blood work to get a second data point on his anemia, we  will also add a full anemia panel to determine the etiology including potential bone marrow failure, I would like cardiology to weigh in as well regarding his pericardial effusion.  We will have a low threshold for hospitalization if his symptoms worsen however he is stable today.  Thrombocytopenia with normocytic iron deficiency anemia This is a very pleasant 72 year old male with a history of a right lower lobectomy for lung cancer, approximately 3 to 4 weeks ago he recalls waking up with muscle aches and body aches, fatigue, cough. No melena, hematochezia, hematemesis. He did some COVID and flu testing which was negative, he did see his primary care provider who did antibiotics and steroids, neither of which helped significantly, he then presented with persistent symptoms of shortness of breath, achiness, coughing to our sister practice [facility], lab work was done that did show fairly severe anemia, which is a significant change since his prior CBC, ultimately a D-dimer was positive, CT angiography was performed as well as chest x-ray, this was negative for infiltrate, negative for pulmonary embolism. What was noted was his baseline COPD and a significant pericardial effusion, not present on an echo in May. He presents for additional advice today, he is still short of breath, his cough has improved. He is speaking full sentences and not using accessory muscles. Lung sounds are clear, cardiac sounds are distant. Abdominal exam is normal and he has 1+ pitting edema bilateral and symmetric. His weight is the same as his baseline, maybe a little bit less. Hemoccult was negative today. I think that his symptomatology is related more to his anemia and pericardial effusion and  less of a primary pulmonary pathology. We will get a stat echocardiogram, I would like additional blood work to get a second data point on his anemia, we will also add a full anemia panel to determine the etiology including  potential bone marrow failure, I would like cardiology to weigh in as well regarding his pericardial effusion.  We will have a low threshold for hospitalization if his symptoms worsen however he is stable today.  Update:  Still awaiting other labs to populate in, anemia has stabilized however platelet count is now down to 72,000.  Combined with the elevated ESR of over 100,000 this raises the suspicion of an autoimmune, likely postviral process such as ITP, considering that he is down two cell lines, Evan's syndrome also comes to mind.  Because of the above I would also like an urgent consultation with hematology.  Update:  As more labs populate in it is confirmed that this is iron deficiency anemia.  We will add iron sulfate 3 times daily. Within normal Hemoccult I am at a loss where the iron deficiency came from especially considering a normal diet.  9 times out of 10 the iron losses in a male his age is still gastrointestinal meaning stomach ulcer, gastritis, or colon mass.  I would like him to discuss this with Dr. Elpidio Anis, his gastroenterologist.  He did have an upper endoscopy this year and a colonoscopy 2 years ago so not urgent but she needs to know so that she can help me determine if he needs additional lower endoscopy.  Iron deficiency anemia As more labs populate in it is confirmed that this is iron deficiency anemia.  We will add iron sulfate 3 times daily. Within normal Hemoccult I am at a loss where the iron deficiency came from especially considering a normal diet.  9 times out of 10 the iron losses in a male his age is still gastrointestinal meaning stomach ulcer, gastritis, or colon mass.  I would like him to discuss this with Dr. Elpidio Anis, his gastroenterologist.  He did have an upper endoscopy this year and a colonoscopy 2 years ago so not urgent but she needs to know so that she can help me determine if he needs additional lower endoscopy.  We did consider  hospitalization.  ____________________________________________ Ihor Austin. Benjamin Stain, M.D., ABFM., CAQSM., AME. Primary Care and Sports Medicine [facility]  Adjunct Professor of Family Medicine  [employer]  Restaurant manager, fast food

## 2022-11-24 NOTE — Assessment & Plan Note (Addendum)
This is a very pleasant 72 year old male with a history of a right lower lobectomy for lung cancer, approximately 3 to 4 weeks ago he recalls waking up with muscle aches and body aches, fatigue, cough. No melena, hematochezia, hematemesis. He did some COVID and flu testing which was negative, he did see his primary care provider who did antibiotics and steroids, neither of which helped significantly, he then presented with persistent symptoms of shortness of breath, achiness, coughing to our sister practice [facility], lab work was done that did show fairly severe anemia, which is a significant change since his prior CBC, ultimately a D-dimer was positive, CT angiography was performed as well as chest x-ray, this was negative for infiltrate, negative for pulmonary embolism. What was noted was his baseline COPD and a significant pericardial effusion, not present on an echo in May. He presents for additional advice today, he is still short of breath, his cough has improved. He is speaking full sentences and not using accessory muscles. Lung sounds are clear, cardiac sounds are distant. Abdominal exam is normal and he has 1+ pitting edema bilateral and symmetric. His weight is the same as his baseline, maybe a little bit less. Hemoccult was negative today. I think that his symptomatology is related more to his anemia and pericardial effusion and less of a primary pulmonary pathology. We will get a stat echocardiogram, I would like additional blood work to get a second data point on his anemia, we will also add a full anemia panel to determine the etiology including potential bone marrow failure, I would like cardiology to weigh in as well regarding his pericardial effusion.  We will have a low threshold for hospitalization if his symptoms worsen however he is stable today.  Update:  Still awaiting other labs to populate in, anemia has stabilized however platelet count is now down to 72,000.  Combined  with the elevated ESR of over 100,000 this raises the suspicion of an autoimmune, likely postviral process such as ITP, considering that he is down two cell lines, Evan's syndrome also comes to mind.  Because of the above I would also like an urgent consultation with hematology.  Update:  As more labs populate in it is confirmed that this is iron deficiency anemia.  We will add iron sulfate 3 times daily. Within normal Hemoccult I am at a loss where the iron deficiency came from especially considering a normal diet.  9 times out of 10 the iron losses in a male his age is still gastrointestinal meaning stomach ulcer, gastritis, or colon mass.  I would like him to discuss this with Dr. Elpidio Anis, his gastroenterologist.  He did have an upper endoscopy this year and a colonoscopy 2 years ago so not urgent but she needs to know so that she can help me determine if he needs additional lower endoscopy.

## 2022-11-25 ENCOUNTER — Ambulatory Visit (HOSPITAL_BASED_OUTPATIENT_CLINIC_OR_DEPARTMENT_OTHER): Payer: Medicare Other

## 2022-11-25 ENCOUNTER — Ambulatory Visit (HOSPITAL_BASED_OUTPATIENT_CLINIC_OR_DEPARTMENT_OTHER): Payer: Medicare Other | Admitting: Family

## 2022-11-25 ENCOUNTER — Encounter (HOSPITAL_BASED_OUTPATIENT_CLINIC_OR_DEPARTMENT_OTHER): Payer: Self-pay | Admitting: Family

## 2022-11-25 ENCOUNTER — Other Ambulatory Visit: Payer: Self-pay | Admitting: Sports Medicine

## 2022-11-25 VITALS — BP 102/50 | HR 94 | Ht 68.5 in | Wt 187.9 lb

## 2022-11-25 DIAGNOSIS — I7121 Aneurysm of the ascending aorta, without rupture: Secondary | ICD-10-CM | POA: Diagnosis not present

## 2022-11-25 DIAGNOSIS — E785 Hyperlipidemia, unspecified: Secondary | ICD-10-CM | POA: Diagnosis not present

## 2022-11-25 DIAGNOSIS — D696 Thrombocytopenia, unspecified: Secondary | ICD-10-CM

## 2022-11-25 DIAGNOSIS — R700 Elevated erythrocyte sedimentation rate: Secondary | ICD-10-CM

## 2022-11-25 DIAGNOSIS — I10 Essential (primary) hypertension: Secondary | ICD-10-CM

## 2022-11-25 DIAGNOSIS — G4733 Obstructive sleep apnea (adult) (pediatric): Secondary | ICD-10-CM

## 2022-11-25 DIAGNOSIS — R0609 Other forms of dyspnea: Secondary | ICD-10-CM

## 2022-11-25 DIAGNOSIS — D649 Anemia, unspecified: Secondary | ICD-10-CM

## 2022-11-25 DIAGNOSIS — I3139 Other pericardial effusion (noninflammatory): Secondary | ICD-10-CM

## 2022-11-25 DIAGNOSIS — I2510 Atherosclerotic heart disease of native coronary artery without angina pectoris: Secondary | ICD-10-CM | POA: Diagnosis not present

## 2022-11-25 DIAGNOSIS — D509 Iron deficiency anemia, unspecified: Secondary | ICD-10-CM

## 2022-11-25 DIAGNOSIS — R600 Localized edema: Secondary | ICD-10-CM

## 2022-11-25 DIAGNOSIS — R7982 Elevated C-reactive protein (CRP): Secondary | ICD-10-CM

## 2022-11-25 LAB — ECHOCARDIOGRAM COMPLETE
AR max vel: 2 cm2
AV Area VTI: 2.24 cm2
AV Area mean vel: 2.03 cm2
AV Mean grad: 8 mm[Hg]
AV Peak grad: 14.6 mm[Hg]
Ao pk vel: 1.91 m/s
Area-P 1/2: 4.15 cm2
Height: 68.5 in
S' Lateral: 2.51 cm
Weight: 3006.4 [oz_av]

## 2022-11-25 LAB — SPECIMEN STATUS REPORT

## 2022-11-25 MED ORDER — FERROUS SULFATE 325 (65 FE) MG PO TBEC: 325.0000 mg | DELAYED_RELEASE_TABLET | Freq: Three times a day (TID) | ORAL | 11 refills | Status: DC

## 2022-11-25 NOTE — Progress Notes (Signed)
Cardiology Office Note:  .   Date:  11/25/2022  ID:  [name], DOB [birth year], MRN [id number] PCP: Nolene Ebbs  [facility] Providers Cardiologist:  None    History of Present Illness: .   Mats Heaster is a 72 y.o. male with hx of ascending aortic aneurysm, HTN, lung cancer s/p resection of RLL, palpitations, IDA, coronary calcification on CT, COPD, HLD, OSA on CPAP.   Chart reviewed: 3- weeks ago waking up with muscle aches, body aches, fever, cough. COVID and flu testing were negative. Treated with abx, steroids without significant improvement. Saw primary care team with elevated d-dimer recommended for CT. CT 11/22/22 no PE but did show small pericardial effusion, coronary calcification, aortic atherosclerosis, emphysema. PCP team referred for echocardiogram (scheduled for later today) and cardiology follow up.   Discussed the use of AI scribe software for clinical note transcription with the patient, who gave verbal consent to proceed.  Mr. Mcmanigal presents today with his wife with a three to four-week history of generalized body aches, lack of motivation, and shortness of breath. The symptoms began suddenly, with the patient waking up feeling as if he had been hit by a truck. The patient was initially diagnosed with pneumonia and treated with antibiotics and steroids. He is understandably very concerned regarding pericardial effusion. The patient also reports experiencing chest pain, both at rest and during activity described as sharp, lasting for a minute, and comes and goes. This has been ongoing for some time and is unchanged since recent illness. No exertional chest discomfort. No change in pain with position changes (i.e., does not resolve with leaning forward). The patient also reports occasional swelling in the ankles this is unchanged from prior. The patient uses a CPAP machine for sleep apnea but reports waking up multiple times at night and experiencing  shortness of breath upon lying down. The CPAP is new over the last 6 months and encouraged to trial different mask through DME company. His most bothersome symptom is exertional dyspnea and generalized fatigue.     11/24/22 sed rate 117, CRP 88, BNP still pending, Na 133, creatinine 1.24, GFR 61, Hb 8.8, Hct 26.2, Plt 72,   Echo after clinic visit 11/25/22 normal LVEF 60-65%, no RWMA, mild LVH, normal diastolic parameters, small pericardial effusion with no tamponade, no significant valvular abnormalities, mild dilation ascending aorta 41mm.   ROS: Please see the history of present illness.    All other systems reviewed and are negative.   Studies Reviewed: [name] Kitchen   EKG Interpretation Date/Time:  Friday November 25 2022 10:18:15 EST Ventricular Rate:  80 PR Interval:  198 QRS Duration:  86 QT Interval:  334 QTC Calculation: 385 R Axis:   62  Text Interpretation: Normal sinus rhythm No acute changes Nonspecific IVCD Confirmed by Gillian Shields ([id number]) on 11/25/2022 10:24:01 AM    Cardiac Studies & Procedures     STRESS TESTS  MYOCARDIAL PERFUSION IMAGING 09/08/2021  Narrative   The study is normal. The study is low risk.   No ST deviation was noted.   LV perfusion is normal. There is no evidence of ischemia. There is no evidence of infarction.   Left ventricular function is normal. Nuclear stress EF: 69 %. The left ventricular ejection fraction is hyperdynamic (>65%). End diastolic cavity size is normal. End systolic cavity size is normal.   ECHOCARDIOGRAM  ECHOCARDIOGRAM COMPLETE 11/25/2022  Narrative ECHOCARDIOGRAM REPORT    Patient Name:   FABIAN ROS Date of Exam: 11/25/2022  Medical Rec #:  [id number]      Height:       68.5 in Accession #:    [id number]     Weight:       189.0 lb Date of Birth:  03-Jan-1951      BSA:          2.006 m Patient Age:    72 years       BP:           102/50 mmHg Patient Gender: M              HR:           82 bpm. Exam Location:   Outpatient  Procedure: 2D Echo, 3D Echo, Cardiac Doppler, Color Doppler and Strain Analysis  Indications:    Pericardial Effusion  History:        Patient has prior history of Echocardiogram examinations, most recent 05/11/2022. Risk Factors:Hypertension and Former Smoker. Thoracic aortic aneurysm 40 mm very of CT from 2024.  Sonographer:    Jeryl Columbia RDCS Referring Phys: [phone] Ihor [facility]   Sonographer Comments: Patient reports increased shortness of breath when laying flat. IMPRESSIONS   1. Left ventricular ejection fraction, by estimation, is 60 to 65%. The left ventricle has normal function. The left ventricle has no regional wall motion abnormalities. There is mild left ventricular hypertrophy. Left ventricular diastolic parameters were normal. 2. Right ventricular systolic function is normal. The right ventricular size is normal. 3. A small pericardial effusion is present. The pericardial effusion is anterior to the right ventricle. 4. The mitral valve is normal in structure. No evidence of mitral valve regurgitation. No evidence of mitral stenosis. 5. The aortic valve is tricuspid. There is mild calcification of the aortic valve. Aortic valve regurgitation is not visualized. Aortic valve sclerosis is present, with no evidence of aortic valve stenosis. 6. Aortic dilatation noted. There is mild dilatation of the ascending aorta, measuring 41 mm. 7. The inferior vena cava is normal in size with greater than 50% respiratory variability, suggesting right atrial pressure of 3 mmHg.  FINDINGS Left Ventricle: Left ventricular ejection fraction, by estimation, is 60 to 65%. The left ventricle has normal function. The left ventricle has no regional wall motion abnormalities. The left ventricular internal cavity size was normal in size. There is mild left ventricular hypertrophy. Left ventricular diastolic parameters were normal.  Right Ventricle: The right ventricular size is  normal. No increase in right ventricular wall thickness. Right ventricular systolic function is normal.  Left Atrium: Left atrial size was normal in size.  Right Atrium: Right atrial size was normal in size.  Pericardium: A small pericardial effusion is present. The pericardial effusion is anterior to the right ventricle.  Mitral Valve: The mitral valve is normal in structure. No evidence of mitral valve regurgitation. No evidence of mitral valve stenosis.  Tricuspid Valve: The tricuspid valve is normal in structure. Tricuspid valve regurgitation is trivial. No evidence of tricuspid stenosis.  Aortic Valve: The aortic valve is tricuspid. There is mild calcification of the aortic valve. Aortic valve regurgitation is not visualized. Aortic valve sclerosis is present, with no evidence of aortic valve stenosis. Aortic valve mean gradient measures 8.0 mmHg. Aortic valve peak gradient measures 14.6 mmHg. Aortic valve area, by VTI measures 2.24 cm.  Pulmonic Valve: The pulmonic valve was normal in structure. Pulmonic valve regurgitation is not visualized. No evidence of pulmonic stenosis.  Aorta: Aortic dilatation noted. There is mild dilatation of the  ascending aorta, measuring 41 mm.  Venous: The inferior vena cava is normal in size with greater than 50% respiratory variability, suggesting right atrial pressure of 3 mmHg.  IAS/Shunts: No atrial level shunt detected by color flow Doppler.   LEFT VENTRICLE PLAX 2D LVIDd:         4.45 cm   Diastology LVIDs:         2.51 cm   LV e' medial:    8.81 cm/s LV PW:         1.36 cm   LV E/e' medial:  6.9 LV IVS:        1.19 cm   LV e' lateral:   7.51 cm/s LVOT diam:     1.90 cm   LV E/e' lateral: 8.1 LV SV:         78 LV SV Index:   39 LVOT Area:     2.84 cm  3D Volume EF: 3D EF:        66 % LV EDV:       110 ml LV ESV:       38 ml LV SV:        72 ml  RIGHT VENTRICLE RV Basal diam:  3.69 cm RV Mid diam:    2.57 cm RV S prime:     14.80  cm/s TAPSE (M-mode): 2.6 cm  LEFT ATRIUM             Index        RIGHT ATRIUM           Index LA diam:        3.10 cm 1.55 cm/m   RA Area:     19.60 cm LA Vol (A2C):   53.9 ml 26.87 ml/m  RA Volume:   52.40 ml  26.12 ml/m LA Vol (A4C):   46.9 ml 23.38 ml/m LA Biplane Vol: 52.5 ml 26.17 ml/m AORTIC VALVE AV Area (Vmax):    2.00 cm AV Area (Vmean):   2.03 cm AV Area (VTI):     2.24 cm AV Vmax:           191.00 cm/s AV Vmean:          128.000 cm/s AV VTI:            0.350 m AV Peak Grad:      14.6 mmHg AV Mean Grad:      8.0 mmHg LVOT Vmax:         135.00 cm/s LVOT Vmean:        91.700 cm/s LVOT VTI:          0.276 m LVOT/AV VTI ratio: 0.79  AORTA Ao Root diam: 3.50 cm Ao Asc diam:  4.10 cm  MITRAL VALVE               TRICUSPID VALVE MV Area (PHT): 4.15 cm    TR Peak grad:   11.3 mmHg MV Decel Time: 183 msec    TR Vmax:        168.00 cm/s MV E velocity: 60.80 cm/s MV A velocity: 73.40 cm/s  SHUNTS MV E/A ratio:  0.83        Systemic VTI:  0.28 m Systemic Diam: 1.90 cm  Charlton Haws MD Electronically signed by Charlton Haws MD Signature Date/Time: 11/25/2022/12:46:22 PM    Final    MONITORS  LONG TERM MONITOR (3-14 DAYS) 05/17/2022  Narrative Patch Wear Time:  13 days and 23 hours (2024-04-10T10:40:34-0400 to 2024-04-24T10:40:26-0400)  Patient had a min HR of 59 bpm, max HR of 179 bpm, and avg HR of 81 bpm. Predominant underlying rhythm was Sinus Rhythm. 12 Supraventricular Tachycardia runs occurred, the run with the fastest interval lasting 6 beats with a max rate of 179 bpm, the longest lasting 13 beats with an avg rate of 137 bpm. Some episodes of Supraventricular Tachycardia may be possible Atrial Tachycardia with variable block. Isolated SVEs were rare (<1.0%), SVE Couplets were rare (<1.0%), and SVE Triplets were rare (<1.0%). Isolated VEs were rare (<1.0%), VE Couplets were rare (<1.0%), and no VE Triplets were present. Ventricular Trigeminy was  present.  Summary and conclusions: 12 episode of supraventricular tachycardia longest episode 13 beats at a rate of 137           Risk Assessment/Calculations:             Physical Exam:   VS:  BP (!) 102/50 (BP Location: Left Arm, Patient Position: Sitting, Cuff Size: Normal)   Pulse 94   Ht 5' 8.5" (1.74 m)   Wt 187 lb 14.4 oz (85.2 kg)   SpO2 94%   BMI 28.15 kg/m    Wt Readings from Last 3 Encounters:  11/25/22 187 lb 14.4 oz (85.2 kg)  11/24/22 189 lb (85.7 kg)  11/21/22 189 lb 6.4 oz (85.9 kg)    GEN: Well nourished, well developed in no acute distress NECK: No JVD; No carotid bruits CARDIAC: RRR, no murmurs, rubs, gallops RESPIRATORY:  Clear to auscultation without rales, wheezing or rhonchi  ABDOMEN: Soft, non-tender, non-distended EXTREMITIES:  No edema; No deformity   ASSESSMENT AND PLAN: .       Pericardial Effusion Small effusion noted on CT 11/22/22. Unclear if this is post-infectious or alternate etiology. Given small size, low suspicion contributory to symptoms. Hesitant to utilize Lasix given mild hyponatremia Na 133 on labs yesterday. Echocardiogram scheduled for further evaluation. -Perform echocardiogram this afternoon. If significant effusion or tamponade, plan for Lasix. If no significant effusion or tamponade, Addendum after visit: Echo normal LVEF, small pericardial effusion, no tamponade. Unlikely contributory to symptoms. No indication for diuretic, body will self absorb. Left him VM with these recommendations  Shortness of Breath / Elevated CRP and Sed Rate Dyspnea present both at rest and with exertion. More noticeable on exertion. Elevated CRP/sed rate likely post infectious. No chest pain consistent with pericarditis and EKG no changes, no further cardiac workup recommended.  -Anticipate dyspnea etiology anemia. IDA management, as below. Will route note to Dr. Myna Hidalgo (Mr. Zanghi previously established) for further guidance.   Iron Deficiency  Anemia 11/24/22 Hemoglobin dropped to 8.7 (prior  3 mos ago Hb 10.7) with iron saturation at 9%. History of poor tolerance to oral iron. -PCP recommended start iron sulfate three times a day. Previously did not tolerate iron with nausea, advised to take with food and plenty of fluids. Encouraged to trial gummy or SlowFe.  HTN  Now with hypotension in setting of anemia. -Stop Amlodipine. Continue Hydralazine 100mg  TID. Check in via MyChart in 1 week. If persistent hypotension, reduce dose to 50mg  TID.  Coronary calcification on CT Noted by CT 11/22/22.  -GDMT includes Aspirin, Atorvastatin.  -Present symptoms atypical for angina, no indication for ischemic evaluation at this time.  Sleep Apnea Patient uses CPAP but reports waking up at night and feeling unrested. -Continue CPAP use. Encouraged to trial alternate mask with DME provider.   Swelling in Ankles  No recent worsening. Intermittent symptoms. Likely venous insufficiency. No  further evaluation at this time.   Dilated ascending aorta Stable at 41mm by recent CT. Continue optimal BP control. Avoid fluoroquinolones. Recommend repeat annual imaging, can be coordinated at follow up.           Dispo: follow up in 2 months  Signed, Alver Sorrow, NP

## 2022-11-25 NOTE — Assessment & Plan Note (Signed)
As more labs populate in it is confirmed that this is iron deficiency anemia.  We will add iron sulfate 3 times daily. Within normal Hemoccult I am at a loss where the iron deficiency came from especially considering a normal diet.  9 times out of 10 the iron losses in a male his age is still gastrointestinal meaning stomach ulcer, gastritis, or colon mass.  I would like him to discuss this with Dr. Elpidio Anis, his gastroenterologist.  He did have an upper endoscopy this year and a colonoscopy 2 years ago so not urgent but she needs to know so that she can help me determine if he needs additional lower endoscopy.

## 2022-11-25 NOTE — Patient Instructions (Addendum)
Medication Instructions:    STOP Amlodipine  You can try over the counter iron supplements: gummies or slow release!   *If you need a refill on your cardiac medications before your next appointment, please call your pharmacy*   Follow-Up: At [facility], you and your health needs are our priority.  As part of our continuing mission to provide you with exceptional heart care, we have created designated Provider Care Teams.  These Care Teams include your primary Cardiologist (physician) and Advanced Practice Providers (APPs -  Physician Assistants and Nurse Practitioners) who all work together to provide you with the care you need, when you need it.  We recommend signing up for the patient portal called "MyChart".  Sign up information is provided on this After Visit Summary.  MyChart is used to connect with patients for Virtual Visits (Telemedicine).  Patients are able to view lab/test results, encounter notes, upcoming appointments, etc.  Non-urgent messages can be sent to your provider as well.   To learn more about what you can do with MyChart, go to [email or web address].    Your next appointment:   2 month(s)  Provider:   Gypsy Balsam, MD or Gillian Shields, NP

## 2022-11-26 LAB — B12 AND FOLATE PANEL
Folate: 14.9 ng/mL (ref >3.0)
Vitamin B-12: 747 pg/mL (ref 232–1245)

## 2022-11-26 LAB — IRON,TIBC AND FERRITIN PANEL
Ferritin: 228 ng/mL (ref 30–400)
Iron Saturation: 9 % — CL (ref 15–55)
Iron: 21 ug/dL — ABNORMAL LOW (ref 38–169)
Total Iron Binding Capacity: 247 ug/dL — ABNORMAL LOW (ref 250–450)
UIBC: 226 ug/dL (ref 111–343)

## 2022-11-26 LAB — BRAIN NATRIURETIC PEPTIDE: BNP: 90.1 pg/mL (ref 0.0–100.0)

## 2022-11-26 LAB — C-REACTIVE PROTEIN: CRP: 88 mg/L — ABNORMAL HIGH (ref 0–10)

## 2022-11-26 LAB — RETICULOCYTES: Retic Ct Pct: 1.7 % (ref 0.6–2.6)

## 2022-11-29 ENCOUNTER — Other Ambulatory Visit: Payer: Self-pay | Admitting: Physician Assistant

## 2022-11-29 DIAGNOSIS — E782 Mixed hyperlipidemia: Secondary | ICD-10-CM

## 2022-11-30 ENCOUNTER — Other Ambulatory Visit: Payer: Self-pay

## 2022-11-30 ENCOUNTER — Inpatient Hospital Stay: Payer: Medicare Other | Admitting: Medical Oncology

## 2022-11-30 ENCOUNTER — Inpatient Hospital Stay: Payer: Medicare Other

## 2022-11-30 ENCOUNTER — Encounter: Payer: Self-pay | Admitting: Medical Oncology

## 2022-11-30 VITALS — BP 127/63 | HR 91 | Temp 98.6°F | Resp 19 | Ht 68.0 in | Wt 189.0 lb

## 2022-11-30 DIAGNOSIS — D696 Thrombocytopenia, unspecified: Secondary | ICD-10-CM

## 2022-11-30 DIAGNOSIS — R770 Abnormality of albumin: Secondary | ICD-10-CM | POA: Diagnosis not present

## 2022-11-30 DIAGNOSIS — D509 Iron deficiency anemia, unspecified: Secondary | ICD-10-CM | POA: Diagnosis present

## 2022-11-30 DIAGNOSIS — D649 Anemia, unspecified: Secondary | ICD-10-CM

## 2022-11-30 DIAGNOSIS — Z87891 Personal history of nicotine dependence: Secondary | ICD-10-CM | POA: Diagnosis not present

## 2022-11-30 DIAGNOSIS — R918 Other nonspecific abnormal finding of lung field: Secondary | ICD-10-CM | POA: Diagnosis not present

## 2022-11-30 LAB — CBC WITH DIFFERENTIAL (CANCER CENTER ONLY)
Abs Immature Granulocytes: 0.1 10*3/uL — ABNORMAL HIGH (ref 0.00–0.07)
Basophils Absolute: 0.1 10*3/uL (ref 0.0–0.1)
Basophils Relative: 1 %
Eosinophils Absolute: 0.4 10*3/uL (ref 0.0–0.5)
Eosinophils Relative: 4 %
HCT: 23.3 % — ABNORMAL LOW (ref 39.0–52.0)
Hemoglobin: 7.7 g/dL — ABNORMAL LOW (ref 13.0–17.0)
Immature Granulocytes: 1 %
Lymphocytes Relative: 11 %
Lymphs Abs: 1 10*3/uL (ref 0.7–4.0)
MCH: 27.7 pg (ref 26.0–34.0)
MCHC: 33 g/dL (ref 30.0–36.0)
MCV: 83.8 fL (ref 80.0–100.0)
Monocytes Absolute: 0.9 10*3/uL (ref 0.1–1.0)
Monocytes Relative: 10 %
Neutro Abs: 6.5 10*3/uL (ref 1.7–7.7)
Neutrophils Relative %: 73 %
Platelet Count: 522 10*3/uL — ABNORMAL HIGH (ref 150–400)
RBC: 2.78 MIL/uL — ABNORMAL LOW (ref 4.22–5.81)
RDW: 14.8 % (ref 11.5–15.5)
WBC Count: 8.9 10*3/uL (ref 4.0–10.5)
nRBC: 0 % (ref 0.0–0.2)

## 2022-11-30 LAB — FERRITIN: Ferritin: 155 ng/mL (ref 24–336)

## 2022-11-30 LAB — CMP (CANCER CENTER ONLY)
ALT: 10 U/L (ref 0–44)
AST: 12 U/L — ABNORMAL LOW (ref 15–41)
Albumin: 2.9 g/dL — ABNORMAL LOW (ref 3.5–5.0)
Alkaline Phosphatase: 80 U/L (ref 38–126)
Anion gap: 8 (ref 5–15)
BUN: 17 mg/dL (ref 8–23)
CO2: 25 mmol/L (ref 22–32)
Calcium: 9.3 mg/dL (ref 8.9–10.3)
Chloride: 100 mmol/L (ref 98–111)
Creatinine: 1.15 mg/dL (ref 0.61–1.24)
GFR, Estimated: >60 mL/min (ref >60)
Glucose, Bld: 110 mg/dL — ABNORMAL HIGH (ref 70–99)
Potassium: 4.5 mmol/L (ref 3.5–5.1)
Sodium: 133 mmol/L — ABNORMAL LOW (ref 135–145)
Total Bilirubin: 0.5 mg/dL (ref <1.2)
Total Protein: 7 g/dL (ref 6.5–8.1)

## 2022-11-30 LAB — RETICULOCYTES
Immature Retic Fract: 15.8 % (ref 2.3–15.9)
RBC.: 2.8 MIL/uL — ABNORMAL LOW (ref 4.22–5.81)
Retic Count, Absolute: 49.3 10*3/uL (ref 19.0–186.0)
Retic Ct Pct: 1.8 % (ref 0.4–3.1)

## 2022-11-30 LAB — CULTURE, BLOOD (SINGLE)

## 2022-11-30 LAB — IRON AND IRON BINDING CAPACITY (CC-WL,HP ONLY)
Iron: 12 ug/dL — ABNORMAL LOW (ref 45–182)
Saturation Ratios: 4 % — ABNORMAL LOW (ref 17.9–39.5)
TIBC: 276 ug/dL (ref 250–450)
UIBC: 264 ug/dL (ref 117–376)

## 2022-11-30 LAB — PREPARE RBC (CROSSMATCH)

## 2022-11-30 LAB — SAVE SMEAR(SSMR), FOR PROVIDER SLIDE REVIEW

## 2022-11-30 LAB — LACTATE DEHYDROGENASE: LDH: 137 U/L (ref 98–192)

## 2022-11-30 NOTE — Progress Notes (Signed)
Hematology and Oncology Follow Up Visit  Draco Donate [id number] 1951/01/08 72 y.o. 11/30/2022   Principle Diagnosis:  Multiple pulmonary nodules -history of tobacco use Normochromic normocytic anemia  Current Therapy:   Observation     Interim History:  Mr. Hartter is back for follow-up earlier than planned due to symptoms of fatigue, SOB and worsening Hgb. He was seen on 11/24/2022 by his PCP for his symptoms and found to have an iron saturation of 9%, ferritin of 228, B12 of 747, folate of 14.9.   He has a history of anemia of unknown cause which has significantly worsened over the past month. No known bleeding, bruising episodes. Last colonoscopy 05/14/2020,  due again in 2025. Last EGD 08/2022 showed mild chronic inactive gastritis which was negative for H. Pylori.   No known history of autoimmune disease.   He denies any night sweats. No unintentional weight loss. No major change in bowel or bladder habits.  He has had no nausea or vomiting.  He has had good appetite.  Not a vegetarian.  No chest pains, new back pain or abdominal pain.   Overall, I would say that his performance status is probably ECOG 0.    Wt Readings from Last 3 Encounters:  11/30/22 189 lb (85.7 kg)  11/25/22 187 lb 14.4 oz (85.2 kg)  11/24/22 189 lb (85.7 kg)    Medications:  Current Outpatient Medications:    albuterol (VENTOLIN HFA) 108 (90 Base) MCG/ACT inhaler, Inhale 2 puffs into the lungs every 4 (four) hours as needed. (Patient taking differently: Inhale 2 puffs into the lungs every 4 (four) hours as needed for wheezing or shortness of breath.), Disp: 6.7 g, Rfl: 0   aspirin EC 81 MG tablet, Take 81 mg by mouth daily., Disp: , Rfl:    atorvastatin (LIPITOR) 40 MG tablet, Take 1 tablet (40 mg total) by mouth daily., Disp: 90 tablet, Rfl: 3   DULoxetine (CYMBALTA) 30 MG capsule, Take 1 capsule (30 mg total) by mouth daily., Disp: 90 capsule, Rfl: 1   ferrous sulfate 325 (65 FE) MG EC tablet,  Take 1 tablet (325 mg total) by mouth 3 (three) times daily with meals., Disp: 90 tablet, Rfl: 11   Fluticasone-Umeclidin-Vilant (TRELEGY ELLIPTA) 100-62.5-25 MCG/ACT AEPB, Inhale 1 puff into the lungs daily in the afternoon., Disp: 60 each, Rfl: 5   gabapentin (NEURONTIN) 300 MG capsule, Take 1-2 tablets up to three times a day., Disp: 180 capsule, Rfl: 1   hydrALAZINE (APRESOLINE) 100 MG tablet, Take 1 tablet (100 mg total) by mouth 3 (three) times daily., Disp: 90 tablet, Rfl: 1   levothyroxine (SYNTHROID) 175 MCG tablet, Take 1 tablet (175 mcg total) by mouth daily before breakfast., Disp: 90 tablet, Rfl: 1   Multiple Vitamin (MULTIVITAMIN) tablet, Take 1 tablet by mouth daily., Disp: , Rfl:    mupirocin ointment (BACTROBAN) 2 %, Apply to back of scalp where bumps are as needed for up to 5 days., Disp: 30 g, Rfl: 0   valsartan-hydrochlorothiazide (DIOVAN-HCT) 320-25 MG tablet, TAKE ONE TABLET BY MOUTH EVERY DAY, Disp: 90 tablet, Rfl: 1  Allergies:  Allergies  Allergen Reactions   Pregabalin Other (See Comments)    Elevated LFTs   Atorvastatin Other (See Comments)    Myalgias.     Myalgias.   Lipitor [Atorvastatin Calcium] Other (See Comments)    Myalgias.    Lisinopril Nausea Only   Merbromin Hives   Thimerosal (Thiomersal) Hives    Past Medical History, Surgical history,  Social history, and Family History were reviewed and updated.  Review of Systems: Review of Systems  Constitutional: Negative.   HENT:  Negative.    Eyes: Negative.   Respiratory: Negative.    Cardiovascular: Negative.   Gastrointestinal: Negative.   Endocrine: Negative.   Genitourinary: Negative.    Musculoskeletal:  Positive for back pain (chronic unchanged).  Skin: Negative.   Neurological: Negative.   Hematological: Negative.   Psychiatric/Behavioral: Negative.      Physical Exam:  height is 5\' 8"  (1.727 m) and weight is 189 lb (85.7 kg). His oral temperature is 98.6 F (37 C). His blood  pressure is 127/63 and his pulse is 91. His respiration is 19 and oxygen saturation is 99%.   Wt Readings from Last 3 Encounters:  11/30/22 189 lb (85.7 kg)  11/25/22 187 lb 14.4 oz (85.2 kg)  11/24/22 189 lb (85.7 kg)    Physical Exam Vitals reviewed.  HENT:     Head: Normocephalic and atraumatic.  Eyes:     Pupils: Pupils are equal, round, and reactive to light.  Cardiovascular:     Rate and Rhythm: Normal rate and regular rhythm.     Heart sounds: Normal heart sounds.  Pulmonary:     Effort: Pulmonary effort is normal.     Breath sounds: Normal breath sounds.  Abdominal:     General: Bowel sounds are normal.     Palpations: Abdomen is soft.  Musculoskeletal:        General: No tenderness or deformity. Normal range of motion.     Cervical back: Normal range of motion.  Lymphadenopathy:     Cervical: No cervical adenopathy.  Skin:    General: Skin is warm and dry.     Findings: No erythema or rash.  Neurological:     Mental Status: He is alert and oriented to person, place, and time.  Psychiatric:        Behavior: Behavior normal.        Thought Content: Thought content normal.        Judgment: Judgment normal.     Lab Results  Component Value Date   WBC 8.9 11/30/2022   HGB 7.7 (L) 11/30/2022   HCT 23.3 (L) 11/30/2022   MCV 83.8 11/30/2022   PLT 522 (H) 11/30/2022     Chemistry      Component Value Date/Time   NA 133 (L) 11/24/2022 0928   K 4.2 11/24/2022 0928   CL 96 11/24/2022 0928   CO2 21 11/24/2022 0928   BUN 15 11/24/2022 0928   CREATININE 1.24 11/24/2022 0928   CREATININE 1.03 08/05/2022 0840   CREATININE 1.44 (H) 01/11/2022 0945   GLU 101 04/01/2013 0000      Component Value Date/Time   CALCIUM 8.7 11/24/2022 0928   ALKPHOS 101 11/24/2022 0928   AST 14 11/24/2022 0928   AST 13 (L) 08/05/2022 0840   ALT 9 11/24/2022 0928   ALT 9 08/05/2022 0840   BILITOT 0.4 11/24/2022 0928   BILITOT 0.4 08/05/2022 0840     Encounter Diagnoses  Name  Primary?   Anemia, unspecified type Yes   Thrombocytopenia (HCC)    Symptomatic anemia     Impression and Plan: Mr. Rumph is a very nice 72 year old white male with symptomatic anemia. Anemia has worsened fairly quickly over the past month without evidence of bleeding. Case reviewed with Dr. Myna Hidalgo.   Recent labs from 11/21/2022 show a B12 level of 747, folate of 14.9, iron saturation  of 9%, ferritin of 228 (likely falsely elevated), and Hgb of 8.7. Recent Occult testing was negative. Today his Hgb dropped to 7.7. Platelets are 522.   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 This along with his lower than expected for Hgb retic count and low albumin are all suggestive of a myeloproliferative disorder; specific concern for potential multiple myeloma. We will obtain a bone marrow biopsy and multiple myeloma labs.  He will return tomorrow for one unit of blood as well as next week for consideration of additional blood products.   We discussed and reviewed red flag signs and symptoms for other the weekend.    Rushie Chestnut, PA-C 11/20/20249:32 AM

## 2022-12-01 ENCOUNTER — Inpatient Hospital Stay: Payer: Medicare Other

## 2022-12-01 DIAGNOSIS — R770 Abnormality of albumin: Secondary | ICD-10-CM | POA: Diagnosis not present

## 2022-12-01 DIAGNOSIS — D696 Thrombocytopenia, unspecified: Secondary | ICD-10-CM | POA: Diagnosis not present

## 2022-12-01 DIAGNOSIS — R918 Other nonspecific abnormal finding of lung field: Secondary | ICD-10-CM | POA: Diagnosis not present

## 2022-12-01 DIAGNOSIS — Z87891 Personal history of nicotine dependence: Secondary | ICD-10-CM | POA: Diagnosis not present

## 2022-12-01 DIAGNOSIS — D649 Anemia, unspecified: Secondary | ICD-10-CM

## 2022-12-01 DIAGNOSIS — D509 Iron deficiency anemia, unspecified: Secondary | ICD-10-CM | POA: Diagnosis not present

## 2022-12-01 LAB — ERYTHROPOIETIN: Erythropoietin: 30.5 m[IU]/mL — ABNORMAL HIGH (ref 2.6–18.5)

## 2022-12-01 LAB — KAPPA/LAMBDA LIGHT CHAINS
Kappa free light chain: 78.1 mg/L — ABNORMAL HIGH (ref 3.3–19.4)
Kappa, lambda light chain ratio: 0.99 (ref 0.26–1.65)
Lambda free light chains: 79.2 mg/L — ABNORMAL HIGH (ref 5.7–26.3)

## 2022-12-01 LAB — ABO/RH: ABO/RH(D): A POS

## 2022-12-01 MED ORDER — DIPHENHYDRAMINE HCL 25 MG PO CAPS
25.0000 mg | ORAL_CAPSULE | Freq: Once | ORAL | Status: AC
  Administered 2022-12-01: 25 mg via ORAL
  Filled 2022-12-01: qty 1

## 2022-12-01 MED ORDER — ACETAMINOPHEN 325 MG PO TABS
650.0000 mg | ORAL_TABLET | Freq: Once | ORAL | Status: AC
  Administered 2022-12-01: 650 mg via ORAL
  Filled 2022-12-01: qty 2

## 2022-12-01 MED ORDER — SODIUM CHLORIDE 0.9% IV SOLUTION
250.0000 mL | INTRAVENOUS | Status: DC
  Administered 2022-12-01: 100 mL via INTRAVENOUS

## 2022-12-02 ENCOUNTER — Other Ambulatory Visit: Payer: Self-pay | Admitting: *Deleted

## 2022-12-02 ENCOUNTER — Other Ambulatory Visit (HOSPITAL_COMMUNITY): Payer: Medicare Other

## 2022-12-02 DIAGNOSIS — D696 Thrombocytopenia, unspecified: Secondary | ICD-10-CM

## 2022-12-02 DIAGNOSIS — D649 Anemia, unspecified: Secondary | ICD-10-CM

## 2022-12-02 LAB — BPAM RBC
Blood Product Expiration Date: 202412142359
ISSUE DATE / TIME: 202411211116
Unit Type and Rh: 6200

## 2022-12-02 LAB — TYPE AND SCREEN
ABO/RH(D): A POS
Antibody Screen: NEGATIVE
Unit division: 0

## 2022-12-04 LAB — MULTIPLE MYELOMA PANEL, SERUM
Albumin SerPl Elph-Mcnc: 2.3 g/dL — ABNORMAL LOW (ref 2.9–4.4)
Albumin/Glob SerPl: 0.6 — ABNORMAL LOW (ref 0.7–1.7)
Alpha 1: 0.5 g/dL — ABNORMAL HIGH (ref 0.0–0.4)
Alpha2 Glob SerPl Elph-Mcnc: 1.1 g/dL — ABNORMAL HIGH (ref 0.4–1.0)
B-Globulin SerPl Elph-Mcnc: 1.1 g/dL (ref 0.7–1.3)
Gamma Glob SerPl Elph-Mcnc: 1.5 g/dL (ref 0.4–1.8)
Globulin, Total: 4.2 g/dL — ABNORMAL HIGH (ref 2.2–3.9)
IgA: 460 mg/dL — ABNORMAL HIGH (ref 61–437)
IgG (Immunoglobin G), Serum: 1520 mg/dL (ref 603–1613)
IgM (Immunoglobulin M), Srm: 145 mg/dL — ABNORMAL HIGH (ref 15–143)
Total Protein ELP: 6.5 g/dL (ref 6.0–8.5)

## 2022-12-05 ENCOUNTER — Other Ambulatory Visit: Payer: Self-pay

## 2022-12-05 ENCOUNTER — Inpatient Hospital Stay: Payer: Medicare Other

## 2022-12-05 ENCOUNTER — Inpatient Hospital Stay: Payer: Medicare Other | Admitting: Medical Oncology

## 2022-12-05 VITALS — BP 138/64 | HR 93 | Temp 98.5°F | Resp 20 | Wt 191.1 lb

## 2022-12-05 VITALS — BP 121/68 | HR 79

## 2022-12-05 DIAGNOSIS — D509 Iron deficiency anemia, unspecified: Secondary | ICD-10-CM

## 2022-12-05 DIAGNOSIS — R918 Other nonspecific abnormal finding of lung field: Secondary | ICD-10-CM | POA: Diagnosis not present

## 2022-12-05 DIAGNOSIS — D696 Thrombocytopenia, unspecified: Secondary | ICD-10-CM

## 2022-12-05 DIAGNOSIS — D649 Anemia, unspecified: Secondary | ICD-10-CM

## 2022-12-05 DIAGNOSIS — R770 Abnormality of albumin: Secondary | ICD-10-CM | POA: Diagnosis not present

## 2022-12-05 DIAGNOSIS — Z87891 Personal history of nicotine dependence: Secondary | ICD-10-CM | POA: Diagnosis not present

## 2022-12-05 LAB — CBC WITH DIFFERENTIAL (CANCER CENTER ONLY)
Abs Immature Granulocytes: 0.1 10*3/uL — ABNORMAL HIGH (ref 0.00–0.07)
Basophils Absolute: 0.1 10*3/uL (ref 0.0–0.1)
Basophils Relative: 1 %
Eosinophils Absolute: 0.4 10*3/uL (ref 0.0–0.5)
Eosinophils Relative: 4 %
HCT: 26.4 % — ABNORMAL LOW (ref 39.0–52.0)
Hemoglobin: 8.6 g/dL — ABNORMAL LOW (ref 13.0–17.0)
Immature Granulocytes: 1 %
Lymphocytes Relative: 12 %
Lymphs Abs: 1.1 10*3/uL (ref 0.7–4.0)
MCH: 27.3 pg (ref 26.0–34.0)
MCHC: 32.6 g/dL (ref 30.0–36.0)
MCV: 83.8 fL (ref 80.0–100.0)
Monocytes Absolute: 0.9 10*3/uL (ref 0.1–1.0)
Monocytes Relative: 10 %
Neutro Abs: 7.1 10*3/uL (ref 1.7–7.7)
Neutrophils Relative %: 72 %
Platelet Count: 654 10*3/uL — ABNORMAL HIGH (ref 150–400)
RBC: 3.15 MIL/uL — ABNORMAL LOW (ref 4.22–5.81)
RDW: 15.2 % (ref 11.5–15.5)
WBC Count: 9.6 10*3/uL (ref 4.0–10.5)
nRBC: 0 % (ref 0.0–0.2)

## 2022-12-05 LAB — SAMPLE TO BLOOD BANK

## 2022-12-05 MED ORDER — SODIUM CHLORIDE 0.9 % IV SOLN: INTRAVENOUS | Status: DC

## 2022-12-05 MED ORDER — SODIUM CHLORIDE 0.9 % IV SOLN
300.0000 mg | Freq: Once | INTRAVENOUS | Status: AC
  Administered 2022-12-05: 300 mg via INTRAVENOUS
  Filled 2022-12-05: qty 300

## 2022-12-05 NOTE — Progress Notes (Signed)
Hematology and Oncology Follow Up Visit  Walter Wilkerson [id number] 07-03-1950 72 y.o. 12/05/2022   Principle Diagnosis:  Multiple pulmonary nodules -history of tobacco use Normochromic normocytic anemia  Current Therapy:   Bone Marrow Biopsy Pending IV Iron -Venover 300 mg planned for today     Interim History:  Walter Wilkerson was seen for follow up earlier than planned due to symptoms of fatigue, SOB and worsening Hgb. He was seen on 11/24/2022 by his PCP for his symptoms and found to have an iron saturation of 9%, ferritin of 228, B12 of 747, folate of 14.9. When we saw him on 11/30/2022 his Hgb was 7.7 with an iron saturation of 4% and ferritin of 115. He had multiple myeloma labs drawn which did not show an M-protein but did show elevated IgA level (460), IgM (145), Globulin total (4.2), elevated kappa and lambda light chains (78.1 and 79.2 respectively). LDH was normal at 137.   He has a history of anemia of unknown cause which has significantly worsened over the past month. No known bleeding, bruising episodes. Last colonoscopy 05/14/2020,  due again in 2025. Last EGD 08/2022 showed mild chronic inactive gastritis which was negative for H. Pylori. His only concern other than fatigue is SOB. He has had extensive work up including EKG, CTA (11/22/2022)  No known history of autoimmune disease.   Understandably, He does have some fairly significant anxiety regarding current work up and how he is feeling. This likely is a contributing factor.   He denies any night sweats. No unintentional weight loss. No major change in bowel or bladder habits.  He has had no nausea or vomiting.  He has had good appetite.  Not a vegetarian.  No chest pains, new back pain or abdominal pain.   Overall, I would say that his performance status is probably ECOG 0.    Wt Readings from Last 3 Encounters:  12/05/22 191 lb 1.9 oz (86.7 kg)  11/30/22 189 lb (85.7 kg)  11/25/22 187 lb 14.4 oz (85.2 kg)     Medications:  Current Outpatient Medications:    albuterol (VENTOLIN HFA) 108 (90 Base) MCG/ACT inhaler, Inhale 2 puffs into the lungs every 4 (four) hours as needed. (Patient taking differently: Inhale 2 puffs into the lungs every 4 (four) hours as needed for wheezing or shortness of breath.), Disp: 6.7 g, Rfl: 0   aspirin EC 81 MG tablet, Take 81 mg by mouth daily., Disp: , Rfl:    atorvastatin (LIPITOR) 40 MG tablet, Take 1 tablet (40 mg total) by mouth daily., Disp: 90 tablet, Rfl: 3   DULoxetine (CYMBALTA) 30 MG capsule, Take 1 capsule (30 mg total) by mouth daily., Disp: 90 capsule, Rfl: 1   ferrous sulfate 325 (65 FE) MG EC tablet, Take 1 tablet (325 mg total) by mouth 3 (three) times daily with meals., Disp: 90 tablet, Rfl: 11   Fluticasone-Umeclidin-Vilant (TRELEGY ELLIPTA) 100-62.5-25 MCG/ACT AEPB, Inhale 1 puff into the lungs daily in the afternoon., Disp: 60 each, Rfl: 5   gabapentin (NEURONTIN) 300 MG capsule, Take 1-2 tablets up to three times a day., Disp: 180 capsule, Rfl: 1   hydrALAZINE (APRESOLINE) 100 MG tablet, Take 1 tablet (100 mg total) by mouth 3 (three) times daily., Disp: 90 tablet, Rfl: 1   levothyroxine (SYNTHROID) 175 MCG tablet, Take 1 tablet (175 mcg total) by mouth daily before breakfast., Disp: 90 tablet, Rfl: 1   Multiple Vitamin (MULTIVITAMIN) tablet, Take 1 tablet by mouth daily., Disp: , Rfl:  mupirocin ointment (BACTROBAN) 2 %, Apply to back of scalp where bumps are as needed for up to 5 days., Disp: 30 g, Rfl: 0   valsartan-hydrochlorothiazide (DIOVAN-HCT) 320-25 MG tablet, TAKE ONE TABLET BY MOUTH EVERY DAY, Disp: 90 tablet, Rfl: 1  Allergies:  Allergies  Allergen Reactions   Pregabalin Other (See Comments)    Elevated LFTs   Atorvastatin Other (See Comments)    Myalgias.     Myalgias.   Lipitor [Atorvastatin Calcium] Other (See Comments)    Myalgias.    Lisinopril Nausea Only   Merbromin Hives   Thimerosal (Thiomersal) Hives    Past  Medical History, Surgical history, Social history, and Family History were reviewed and updated.  Review of Systems: Review of Systems  Constitutional: Negative.   HENT:  Negative.    Eyes: Negative.   Respiratory: Negative.    Cardiovascular: Negative.   Gastrointestinal: Negative.   Endocrine: Negative.   Genitourinary: Negative.    Musculoskeletal:  Positive for back pain (chronic unchanged).  Skin: Negative.   Neurological: Negative.   Hematological: Negative.   Psychiatric/Behavioral: Negative.      Physical Exam:  weight is 191 lb 1.9 oz (86.7 kg). His oral temperature is 98.5 F (36.9 C). His blood pressure is 138/64 and his pulse is 93. His respiration is 20 and oxygen saturation is 99%.   Wt Readings from Last 3 Encounters:  12/05/22 191 lb 1.9 oz (86.7 kg)  11/30/22 189 lb (85.7 kg)  11/25/22 187 lb 14.4 oz (85.2 kg)    Physical Exam Vitals reviewed.  HENT:     Head: Normocephalic and atraumatic.  Eyes:     Pupils: Pupils are equal, round, and reactive to light.  Cardiovascular:     Rate and Rhythm: Normal rate and regular rhythm.     Heart sounds: Normal heart sounds.  Pulmonary:     Effort: Pulmonary effort is normal.     Breath sounds: Normal breath sounds.  Abdominal:     General: Bowel sounds are normal.     Palpations: Abdomen is soft.  Musculoskeletal:        General: No tenderness or deformity. Normal range of motion.     Cervical back: Normal range of motion.  Lymphadenopathy:     Cervical: No cervical adenopathy.  Skin:    General: Skin is warm and dry.     Findings: No erythema or rash.  Neurological:     Mental Status: He is alert and oriented to person, place, and time.  Psychiatric:        Behavior: Behavior normal.        Thought Content: Thought content normal.        Judgment: Judgment normal.     Lab Results  Component Value Date   WBC 9.6 12/05/2022   HGB 8.6 (L) 12/05/2022   HCT 26.4 (L) 12/05/2022   MCV 83.8 12/05/2022    PLT 654 (H) 12/05/2022     Chemistry      Component Value Date/Time   NA 133 (L) 11/30/2022 0909   NA 133 (L) 11/24/2022 0928   K 4.5 11/30/2022 0909   CL 100 11/30/2022 0909   CO2 25 11/30/2022 0909   BUN 17 11/30/2022 0909   BUN 15 11/24/2022 0928   CREATININE 1.15 11/30/2022 0909   CREATININE 1.44 (H) 01/11/2022 0945   GLU 101 04/01/2013 0000      Component Value Date/Time   CALCIUM 9.3 11/30/2022 0909   ALKPHOS 80 11/30/2022  0909   AST 12 (L) 11/30/2022 0909   ALT 10 11/30/2022 0909   BILITOT 0.5 11/30/2022 0102     Encounter Diagnoses  Name Primary?   Thrombocytopenia (HCC) Yes   Symptomatic anemia    Iron deficiency anemia, unspecified iron deficiency anemia type     Impression and Plan: Walter Wilkerson is a very nice 72 year old white male with symptomatic anemia. Anemia has worsened fairly quickly over the past month without evidence of bleeding.  Review of his CBC today shows that he has held onto his unit of blood well. This is good and suggests that he will do well with iron infusions.  IV Iron today- Discussed how this is performed along with common risks/benefits/precautions.  He has his bone marrow biopsy this Friday If his SOB worsens or he has any red flag signs and symptoms that we discussed I have suggest ER evaluation  Returning at 1:45 for iron today Cancel unit of blood appointment for tomorrow  RTC 1 week MD or APP, labs (CBC w/, LDH, save smear, sample to blood bank, retic) + iron RTC next Tuesday 1 unit of blood  RTC 2 weeks MD or APP, labs (CBC w/, LDH, save smear, sample to blood bank, retic) + IV iron   Rushie Chestnut, PA-C [phone] AM

## 2022-12-05 NOTE — Patient Instructions (Signed)
Iron Sucrose Injection What is this medication? IRON SUCROSE (EYE ern SOO krose) treats low levels of iron (iron deficiency anemia) in people with kidney disease. Iron is a mineral that plays an important role in making red blood cells, which carry oxygen from your lungs to the rest of your body. This medicine may be used for other purposes; ask your health care provider or pharmacist if you have questions. COMMON BRAND NAME(S): Venofer What should I tell my care team before I take this medication? They need to know if you have any of these conditions: Anemia not caused by low iron levels Heart disease High levels of iron in the blood Kidney disease Liver disease An unusual or allergic reaction to iron, other medications, foods, dyes, or preservatives Pregnant or trying to get pregnant Breastfeeding How should I use this medication? This medication is for infusion into a vein. It is given in a hospital or clinic setting. Talk to your care team about the use of this medication in children. While this medication may be prescribed for children as young as 2 years for selected conditions, precautions do apply. Overdosage: If you think you have taken too much of this medicine contact a poison control center or emergency room at once. NOTE: This medicine is only for you. Do not share this medicine with others. What if I miss a dose? Keep appointments for follow-up doses. It is important not to miss your dose. Call your care team if you are unable to keep an appointment. What may interact with this medication? Do not take this medication with any of the following: Deferoxamine Dimercaprol Other iron products This medication may also interact with the following: Chloramphenicol Deferasirox This list may not describe all possible interactions. Give your health care provider a list of all the medicines, herbs, non-prescription drugs, or dietary supplements you use. Also tell them if you smoke,  drink alcohol, or use illegal drugs. Some items may interact with your medicine. What should I watch for while using this medication? Visit your care team regularly. Tell your care team if your symptoms do not start to get better or if they get worse. You may need blood work done while you are taking this medication. You may need to follow a special diet. Talk to your care team. Foods that contain iron include: whole grains/cereals, dried fruits, beans, or peas, leafy green vegetables, and organ meats (liver, kidney). What side effects may I notice from receiving this medication? Side effects that you should report to your care team as soon as possible: Allergic reactions--skin rash, itching, hives, swelling of the face, lips, tongue, or throat Low blood pressure--dizziness, feeling faint or lightheaded, blurry vision Shortness of breath Side effects that usually do not require medical attention (report to your care team if they continue or are bothersome): Flushing Headache Joint pain Muscle pain Nausea Pain, redness, or irritation at injection site This list may not describe all possible side effects. Call your doctor for medical advice about side effects. You may report side effects to FDA at 1-800-FDA-1088. Where should I keep my medication? This medication is given in a hospital or clinic. It will not be stored at home. NOTE: This sheet is a summary. It may not cover all possible information. If you have questions about this medicine, talk to your doctor, pharmacist, or health care provider.  2024 [employer]/Gold Standard (2022-06-03 00:00:00)

## 2022-12-06 ENCOUNTER — Other Ambulatory Visit: Payer: Self-pay | Admitting: Radiology

## 2022-12-06 ENCOUNTER — Inpatient Hospital Stay: Payer: Medicare Other

## 2022-12-06 DIAGNOSIS — D649 Anemia, unspecified: Secondary | ICD-10-CM

## 2022-12-07 NOTE — H&P (Signed)
Chief Complaint: Patient was seen in consultation today for bone marrow biopsy and aspiration for evaluation of anemia and thrombocytopenia, at the request of Covington,Sarah M, PA-C  Supervising Physician: Richarda Overlie  Patient Status: [facility] - Out-pt  History of Present Illness: Walter Wilkerson is a 72 y.o. male   FULL Code status per patient.  Patient is a 72 year old white male with symptomatic anemia. Anemia has worsened fairly quickly recently without evidence of bleeding. Patient's primary concern other than fatigue is SOB. He has had extensive work up including EKG, CTA (11/22/2022)  Labs on 11/11 show a B12 level of 747, folate of 14.9, iron saturation of 9%, ferritin of 228 (likely falsely elevated), and Hgb of 8.7. Recent Occult testing was negative. Today his Hgb dropped to 7.7. Platelets are 522.   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 However, Multiple Myeloma labs did not show an M-protein but did show elevated IgA level (460), IgM (145), Globulin total (4.2), elevated kappa and lambda light chains (78.1 and 79.2 respectively). LDH was normal at 137.   Patient received 1 unit PRBCs 11/21.  He denies any night sweats. No unintentional weight loss. No major change in bowel or bladder habits.  He has had no nausea or vomiting.  He has had good appetite.  Not a vegetarian. No chest pains, new back pain or abdominal pain.   IR was requested for bone marrow biopsy and aspiration. Request reviewed and approved. Patient is scheduled for same in IR today.    Past Medical History:  Diagnosis Date   Hypertension    Lung cancer [facility])    Thyroid disease     Past Surgical History:  Procedure Laterality Date   LUNG LOBECTOMY Right 2008   RLL resection   R knee arthroscopy     REPLACEMENT TOTAL KNEE Right 09/15/2021   Dr. Jodi Geralds   VASECTOMY      Allergies: Pregabalin, Atorvastatin, Lipitor [atorvastatin calcium], Lisinopril, Merbromin, and Thimerosal  (thiomersal)  Medications: Prior to Admission medications   Medication Sig Start Date End Date Taking? Authorizing Provider  albuterol (VENTOLIN HFA) 108 (90 Base) MCG/ACT inhaler Inhale 2 puffs into the lungs every 4 (four) hours as needed. Patient taking differently: Inhale 2 puffs into the lungs every 4 (four) hours as needed for wheezing or shortness of breath. 12/08/20   Jomarie Longs, PA-C  aspirin EC 81 MG tablet Take 81 mg by mouth daily.    [provider]  atorvastatin (LIPITOR) 40 MG tablet Take 1 tablet (40 mg total) by mouth daily. 11/29/22   Breeback, Lonna Cobb, PA-C  DULoxetine (CYMBALTA) 30 MG capsule Take 1 capsule (30 mg total) by mouth daily. 06/09/22   Agapito Games, MD  ferrous sulfate 325 (65 FE) MG EC tablet Take 1 tablet (325 mg total) by mouth 3 (three) times daily with meals. 11/25/22   Monica Becton, MD  Fluticasone-Umeclidin-Vilant (TRELEGY ELLIPTA) 100-62.5-25 MCG/ACT AEPB Inhale 1 puff into the lungs daily in the afternoon. 04/05/22   Coralyn Helling, MD  gabapentin (NEURONTIN) 300 MG capsule Take 1-2 tablets up to three times a day. 09/09/22   Breeback, Jade L, PA-C  hydrALAZINE (APRESOLINE) 100 MG tablet Take 1 tablet (100 mg total) by mouth 3 (three) times daily. 11/02/22   Jomarie Longs, PA-C  levothyroxine (SYNTHROID) 175 MCG tablet Take 1 tablet (175 mcg total) by mouth daily before breakfast. 11/11/22   Breeback, Jade L, PA-C  Multiple Vitamin (MULTIVITAMIN) tablet Take 1  tablet by mouth daily.    [provider]  mupirocin ointment (BACTROBAN) 2 % Apply to back of scalp where bumps are as needed for up to 5 days. 10/17/22   Breeback, Jade L, PA-C  valsartan-hydrochlorothiazide (DIOVAN-HCT) 320-25 MG tablet TAKE ONE TABLET BY MOUTH EVERY DAY 10/31/22   Jomarie Longs, PA-C     Family History  Problem Relation Age of Onset   Heart attack Father    Hyperlipidemia Father    Hypertension Father    Heart disease Father     Diabetes Other        grandmother     Social History   Socioeconomic History   Marital status: Married    Spouse name: Walter Wilkerson   Number of children: 3   Years of education: 12   Highest education level: 12th grade  Occupational History   Occupation: [employer]    Comment: part time  Tobacco Use   Smoking status: Former    Current packs/day: 0.00    Average packs/day: 1 pack/day for 30.0 years (30.0 ttl pk-yrs)    Types: Cigarettes    Start date: 08/11/1974    Quit date: 08/10/2004    Years since quitting: 18.3   Smokeless tobacco: Never  Vaping Use   Vaping status: Never Used  Substance and Sexual Activity   Alcohol use: Yes    Alcohol/week: 21.0 standard drinks of alcohol    Types: 21 Shots of liquor per week    Comment: 1-2 shots every night   Drug use: No   Sexual activity: Not Currently    Partners: Female  Other Topics Concern   Not on file  Social History Narrative   Lives with his wife and son. 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   Social Determinants of Health   Financial Resource Strain: Low Risk  (06/28/2022)   Received from [facility]   Overall Financial Resource Strain (CARDIA)    Difficulty of Paying Living Expenses: Not hard at all  Food Insecurity: No Food Insecurity (06/28/2022)   Received from [facility]   Hunger Vital Sign    Worried About Running Out of Food in the Last Year: Never true    Ran Out of Food in the Last Year: Never true  Transportation Needs: No Transportation Needs (06/28/2022)   Received from [facility] - Transportation    Lack of Transportation (Medical): No    Lack of Transportation (Non-Medical): No  Physical Activity: Unknown (06/28/2022)   Received from [facility]   Exercise Vital Sign    Days of Exercise per Week: 4 days    Minutes of Exercise per Session: Not on file  Recent Concern: Physical Activity - Inactive (04/18/2022)   Exercise Vital Sign    Days of Exercise per Week: 0 days     Minutes of Exercise per Session: 0 min  Stress: No Stress Concern Present (06/28/2022)   Received from [facility] - Occupational Stress Questionnaire    Feeling of Stress : Not at all  Social Connections: Socially Integrated (06/28/2022)   Received from [facility]   Social Network    How would you rate your social network (family, work, friends)?: Good participation with social networks   Review of Systems: A 12 point ROS discussed and pertinent positives are indicated in the HPI above.  All other systems are negative.  Review of Systems  Constitutional:  Positive for fatigue. Negative  for chills and fever.  Respiratory:  Positive for cough, chest tightness and shortness of breath.   Cardiovascular:  Negative for chest pain.       Chest wall pain from coughing  Gastrointestinal:  Negative for abdominal pain.  Skin:  Negative for color change.  Psychiatric/Behavioral:  Positive for behavioral problems. Negative for confusion.     Vital Signs: BP (!) 161/85 (BP Location: Right Arm)   Pulse (!) 109   Temp 98.9 F (37.2 C) (Temporal)   Resp 20   Ht 5\' 8"  (1.727 m)   Wt 191 lb 1.9 oz (86.7 kg)   SpO2 97%   BMI 29.06 kg/m   Advance Care Plan: The advanced care plan/surrogate decision maker was discussed at the time of visit and documented in the medical record.    Physical Exam Constitutional:      Appearance: Normal appearance. He is ill-appearing.  HENT:     Mouth/Throat:     Mouth: Mucous membranes are moist.  Cardiovascular:     Rate and Rhythm: Regular rhythm. Tachycardia present.     Pulses: Normal pulses.     Heart sounds: Murmur heard.  Pulmonary:     Comments: Shortness of breath with coughing Abdominal:     Tenderness: There is no abdominal tenderness.  Musculoskeletal:        General: Normal range of motion.  Skin:    General: Skin is warm and dry.  Neurological:     Mental Status: He is alert and oriented to  person, place, and time.  Psychiatric:        Mood and Affect: Mood normal.        Behavior: Behavior normal.        Thought Content: Thought content normal.        Judgment: Judgment normal.     Imaging: ECHOCARDIOGRAM COMPLETE  Result Date: 11/25/2022    ECHOCARDIOGRAM REPORT   Patient Name:   SULLIVAN KARWOWSKI Date of Exam: 11/25/2022 Medical Rec #:  [id number]      Height:       68.5 in Accession #:    [id number]     Weight:       189.0 lb Date of Birth:  1950-03-26      BSA:          2.006 m Patient Age:    72 years       BP:           102/50 mmHg Patient Gender: M              HR:           82 bpm. Exam Location:  Outpatient Procedure: 2D Echo, 3D Echo, Cardiac Doppler, Color Doppler and Strain Analysis Indications:    Pericardial Effusion  History:        Patient has prior history of Echocardiogram examinations, most                 recent 05/11/2022. Risk Factors:Hypertension and Former Smoker.                 Thoracic aortic aneurysm 40 mm very of CT from 2024.  Sonographer:    Jeryl Columbia RDCS Referring Phys: [phone] [facility]  Sonographer Comments: Patient reports increased shortness of breath when laying flat. IMPRESSIONS  1. Left ventricular ejection fraction, by estimation, is 60 to 65%. The left ventricle has normal function. The left ventricle has no regional wall motion abnormalities. There is mild  left ventricular hypertrophy. Left ventricular diastolic parameters were normal.  2. Right ventricular systolic function is normal. The right ventricular size is normal.  3. A small pericardial effusion is present. The pericardial effusion is anterior to the right ventricle.  4. The mitral valve is normal in structure. No evidence of mitral valve regurgitation. No evidence of mitral stenosis.  5. The aortic valve is tricuspid. There is mild calcification of the aortic valve. Aortic valve regurgitation is not visualized. Aortic valve sclerosis is present, with no evidence of aortic  valve stenosis.  6. Aortic dilatation noted. There is mild dilatation of the ascending aorta, measuring 41 mm.  7. The inferior vena cava is normal in size with greater than 50% respiratory variability, suggesting right atrial pressure of 3 mmHg. FINDINGS  Left Ventricle: Left ventricular ejection fraction, by estimation, is 60 to 65%. The left ventricle has normal function. The left ventricle has no regional wall motion abnormalities. The left ventricular internal cavity size was normal in size. There is  mild left ventricular hypertrophy. Left ventricular diastolic parameters were normal. Right Ventricle: The right ventricular size is normal. No increase in right ventricular wall thickness. Right ventricular systolic function is normal. Left Atrium: Left atrial size was normal in size. Right Atrium: Right atrial size was normal in size. Pericardium: A small pericardial effusion is present. The pericardial effusion is anterior to the right ventricle. Mitral Valve: The mitral valve is normal in structure. No evidence of mitral valve regurgitation. No evidence of mitral valve stenosis. Tricuspid Valve: The tricuspid valve is normal in structure. Tricuspid valve regurgitation is trivial. No evidence of tricuspid stenosis. Aortic Valve: The aortic valve is tricuspid. There is mild calcification of the aortic valve. Aortic valve regurgitation is not visualized. Aortic valve sclerosis is present, with no evidence of aortic valve stenosis. Aortic valve mean gradient measures 8.0 mmHg. Aortic valve peak gradient measures 14.6 mmHg. Aortic valve area, by VTI measures 2.24 cm. Pulmonic Valve: The pulmonic valve was normal in structure. Pulmonic valve regurgitation is not visualized. No evidence of pulmonic stenosis. Aorta: Aortic dilatation noted. There is mild dilatation of the ascending aorta, measuring 41 mm. Venous: The inferior vena cava is normal in size with greater than 50% respiratory variability, suggesting right  atrial pressure of 3 mmHg. IAS/Shunts: No atrial level shunt detected by color flow Doppler.  LEFT VENTRICLE PLAX 2D LVIDd:         4.45 cm   Diastology LVIDs:         2.51 cm   LV e' medial:    8.81 cm/s LV PW:         1.36 cm   LV E/e' medial:  6.9 LV IVS:        1.19 cm   LV e' lateral:   7.51 cm/s LVOT diam:     1.90 cm   LV E/e' lateral: 8.1 LV SV:         78 LV SV Index:   39 LVOT Area:     2.84 cm                           3D Volume EF:                          3D EF:        66 %  LV EDV:       110 ml                          LV ESV:       38 ml                          LV SV:        72 ml RIGHT VENTRICLE RV Basal diam:  3.69 cm RV Mid diam:    2.57 cm RV S prime:     14.80 cm/s TAPSE (M-mode): 2.6 cm LEFT ATRIUM             Index        RIGHT ATRIUM           Index LA diam:        3.10 cm 1.55 cm/m   RA Area:     19.60 cm LA Vol (A2C):   53.9 ml 26.87 ml/m  RA Volume:   52.40 ml  26.12 ml/m LA Vol (A4C):   46.9 ml 23.38 ml/m LA Biplane Vol: 52.5 ml 26.17 ml/m  AORTIC VALVE AV Area (Vmax):    2.00 cm AV Area (Vmean):   2.03 cm AV Area (VTI):     2.24 cm AV Vmax:           191.00 cm/s AV Vmean:          128.000 cm/s AV VTI:            0.350 m AV Peak Grad:      14.6 mmHg AV Mean Grad:      8.0 mmHg LVOT Vmax:         135.00 cm/s LVOT Vmean:        91.700 cm/s LVOT VTI:          0.276 m LVOT/AV VTI ratio: 0.79  AORTA Ao Root diam: 3.50 cm Ao Asc diam:  4.10 cm MITRAL VALVE               TRICUSPID VALVE MV Area (PHT): 4.15 cm    TR Peak grad:   11.3 mmHg MV Decel Time: 183 msec    TR Vmax:        168.00 cm/s MV E velocity: 60.80 cm/s MV A velocity: 73.40 cm/s  SHUNTS MV E/A ratio:  0.83        Systemic VTI:  0.28 m                            Systemic Diam: 1.90 cm Charlton Haws MD Electronically signed by Charlton Haws MD Signature Date/Time: 11/25/2022/12:46:22 PM    Final    CT Angio Chest Pulmonary Embolism (PE) W or WO Contrast  Result Date: 11/22/2022 CLINICAL DATA:   Shortness of breath, elevated D-dimer level. EXAM: CT ANGIOGRAPHY CHEST WITH CONTRAST TECHNIQUE: Multidetector CT imaging of the chest was performed using the standard protocol during bolus administration of intravenous contrast. Multiplanar CT image reconstructions and MIPs were obtained to evaluate the vascular anatomy. RADIATION DOSE REDUCTION: This exam was performed according to the departmental dose-optimization program which includes automated exposure control, adjustment of the mA and/or kV according to patient size and/or use of iterative reconstruction technique. CONTRAST:  75mL OMNIPAQUE IOHEXOL 350 MG/ML SOLN COMPARISON:  July 26, 2022. FINDINGS: Cardiovascular: Satisfactory opacification of the pulmonary arteries to the segmental level. No evidence  of pulmonary embolism. Normal heart size. Small pericardial effusion. Coronary artery calcifications are noted. Mediastinum/Nodes: No enlarged mediastinal, hilar, or axillary lymph nodes. Thyroid gland, trachea, and esophagus demonstrate no significant findings. Lungs/Pleura: Status post right lower lobectomy. No pneumothorax is noted. Emphysematous disease is noted. Left posterior basilar subsegmental atelectasis is noted. Minimal right basilar subsegmental atelectasis is noted as well. Upper Abdomen: No acute abnormality. Musculoskeletal: No chest wall abnormality. No acute or significant osseous findings. Review of the MIP images confirms the above findings. IMPRESSION: No evidence of pulmonary embolus. Small pericardial effusion. Status post right lower lobectomy. Bibasilar subsegmental atelectasis. Coronary artery calcifications are noted. Aortic Atherosclerosis (ICD10-I70.0) and Emphysema (ICD10-J43.9). Electronically Signed   By: Lupita Raider M.D.   On: 11/22/2022 20:49   DG Chest 2 View  Result Date: 11/21/2022 CLINICAL DATA:  Ongoing cough EXAM: CHEST - 2 VIEW COMPARISON:  CT 07/26/2022. FINDINGS: Hyperinflation. Chronic interstitial  appearing changes. Apical pleural thickening. Small right pleural effusion. Elevated right hemidiaphragm. No consolidation or pneumothorax. Calcified aorta. Normal cardiopericardial silhouette. Degenerative changes along the spine. IMPRESSION: Hyperinflation.  Chronic interstitial changes. Elevated right hemidiaphragm with a very small right pleural effusion versus pleural thickening. No acute consolidation. Electronically Signed   By: Karen Kays M.D.   On: 11/21/2022 19:18    Labs:  CBC: Recent Labs    11/21/22 1612 11/24/22 0928 11/30/22 0909 12/05/22 1104  WBC 11.0* 7.5 8.9 9.6  HGB 8.7* 8.8* 7.7* 8.6*  HCT 27.6* 26.2* 23.3* 26.4*  PLT Comment 72* 522* 654*    COAGS: No results for input(s): "INR", "APTT" in the last 8760 hours.  BMP: Recent Labs    03/30/22 1102 05/13/22 1439 08/05/22 0840 11/24/22 0928 11/30/22 0909  NA 136 137 139 133* 133*  K 4.9 4.6 4.2 4.2 4.5  CL 101 105 106 96 100  CO2 25 26 25 21 25   GLUCOSE 95 115* 94 96 110*  BUN 28* 24* 16 15 17   CALCIUM 9.7 9.5 9.6 8.7 9.3  CREATININE 1.35* 1.29* 1.03 1.24 1.15  GFRNONAA 56* 59* >60  --  >60    LIVER FUNCTION TESTS: Recent Labs    05/13/22 1439 08/05/22 0840 11/24/22 0928 11/30/22 0909  BILITOT 0.4 0.4 0.4 0.5  AST 14* 13* 14 12*  ALT 12 9 9 10   ALKPHOS 72 56 101 80  PROT 7.1 7.1 6.5 7.0  ALBUMIN 3.8 3.9 3.1* 2.9*    TUMOR MARKERS: Recent Labs    05/13/22 1439  CEA 3.42    Assessment and Plan:  Patient is a 72 year old white male with symptomatic anemia. Anemia has worsened fairly quickly recently without evidence of bleeding. Patient's primary concern other than fatigue is SOB. He has had extensive work up. 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 However, Multiple Myeloma labs not consistent. Patient received 1 unit PRBCs 11/21.  IR was requested for bone marrow biopsy and aspiration. Request reviewed and approved. Patient is scheduled for same in IR today.  Patient  is short of breath and coughing on examination this morning. Will consider placement on side.  Risks and benefits of bone marrow biopsy and aspiration was discussed with the patient and/or patient's family including, but not limited to bleeding, infection, damage to adjacent structures or low yield requiring additional tests.  All of the questions were answered and there is agreement to proceed.  Consent signed and in chart.    Thank you for this interesting consult.  I greatly enjoyed meeting [employer]  and look forward to participating in their care.  A copy of this report was sent to the requesting provider on this date.  Electronically Signed: Sable Feil, PA-C 12/09/2022, 10:13 AM   I spent a total of  30 Minutes   in face to face in clinical consultation, greater than 50% of which was counseling/coordinating care for bone marrow biopsy and aspiration.

## 2022-12-08 ENCOUNTER — Other Ambulatory Visit: Payer: Self-pay | Admitting: Family Medicine

## 2022-12-08 DIAGNOSIS — G894 Chronic pain syndrome: Secondary | ICD-10-CM

## 2022-12-09 ENCOUNTER — Ambulatory Visit (HOSPITAL_COMMUNITY)
Admission: RE | Admit: 2022-12-09 | Discharge: 2022-12-09 | Disposition: A | Discharge status: Home / Self Care | Payer: Medicare Other | Source: Ambulatory Visit | Attending: Medical Oncology | Admitting: Medical Oncology

## 2022-12-09 ENCOUNTER — Other Ambulatory Visit: Payer: Self-pay

## 2022-12-09 ENCOUNTER — Encounter (HOSPITAL_COMMUNITY): Payer: Self-pay

## 2022-12-09 DIAGNOSIS — D696 Thrombocytopenia, unspecified: Secondary | ICD-10-CM | POA: Diagnosis not present

## 2022-12-09 DIAGNOSIS — D6489 Other specified anemias: Secondary | ICD-10-CM | POA: Diagnosis not present

## 2022-12-09 DIAGNOSIS — D72829 Elevated white blood cell count, unspecified: Secondary | ICD-10-CM | POA: Diagnosis not present

## 2022-12-09 DIAGNOSIS — C9000 Multiple myeloma not having achieved remission: Secondary | ICD-10-CM | POA: Insufficient documentation

## 2022-12-09 DIAGNOSIS — Z1379 Encounter for other screening for genetic and chromosomal anomalies: Secondary | ICD-10-CM | POA: Insufficient documentation

## 2022-12-09 DIAGNOSIS — D75839 Thrombocytosis, unspecified: Secondary | ICD-10-CM | POA: Diagnosis not present

## 2022-12-09 DIAGNOSIS — D649 Anemia, unspecified: Secondary | ICD-10-CM | POA: Diagnosis not present

## 2022-12-09 LAB — CBC WITH DIFFERENTIAL/PLATELET
Abs Immature Granulocytes: 0.22 10*3/uL — ABNORMAL HIGH (ref 0.00–0.07)
Basophils Absolute: 0.1 10*3/uL (ref 0.0–0.1)
Basophils Relative: 1 %
Eosinophils Absolute: 0.2 10*3/uL (ref 0.0–0.5)
Eosinophils Relative: 2 %
HCT: 27 % — ABNORMAL LOW (ref 39.0–52.0)
Hemoglobin: 8.8 g/dL — ABNORMAL LOW (ref 13.0–17.0)
Immature Granulocytes: 2 %
Lymphocytes Relative: 8 %
Lymphs Abs: 1.1 10*3/uL (ref 0.7–4.0)
MCH: 27.2 pg (ref 26.0–34.0)
MCHC: 32.6 g/dL (ref 30.0–36.0)
MCV: 83.3 fL (ref 80.0–100.0)
Monocytes Absolute: 1.2 10*3/uL — ABNORMAL HIGH (ref 0.1–1.0)
Monocytes Relative: 9 %
Neutro Abs: 10.5 10*3/uL — ABNORMAL HIGH (ref 1.7–7.7)
Neutrophils Relative %: 78 %
Platelets: 440 10*3/uL — ABNORMAL HIGH (ref 150–400)
RBC: 3.24 MIL/uL — ABNORMAL LOW (ref 4.22–5.81)
RDW: 15.9 % — ABNORMAL HIGH (ref 11.5–15.5)
WBC: 13.3 10*3/uL — ABNORMAL HIGH (ref 4.0–10.5)
nRBC: 0 % (ref 0.0–0.2)

## 2022-12-09 MED ORDER — MIDAZOLAM HCL 2 MG/2ML IJ SOLN
INTRAMUSCULAR | Status: AC | PRN
  Administered 2022-12-09: .5 mg via INTRAVENOUS

## 2022-12-09 MED ORDER — MIDAZOLAM HCL 2 MG/2ML IJ SOLN
INTRAMUSCULAR | Status: AC
Start: 2022-12-09 — End: ?
  Filled 2022-12-09: qty 2

## 2022-12-09 MED ORDER — MIDAZOLAM HCL 2 MG/2ML IJ SOLN
INTRAMUSCULAR | Status: AC | PRN
  Administered 2022-12-09: 1 mg via INTRAVENOUS

## 2022-12-09 MED ORDER — FENTANYL CITRATE (PF) 100 MCG/2ML IJ SOLN
INTRAMUSCULAR | Status: AC | PRN
  Administered 2022-12-09: 50 ug via INTRAVENOUS

## 2022-12-09 MED ORDER — FENTANYL CITRATE (PF) 100 MCG/2ML IJ SOLN
INTRAMUSCULAR | Status: AC | PRN
Start: 2022-12-09 — End: 2022-12-09
  Administered 2022-12-09: 50 ug via INTRAVENOUS

## 2022-12-09 MED ORDER — FENTANYL CITRATE (PF) 100 MCG/2ML IJ SOLN
INTRAMUSCULAR | Status: AC
  Filled 2022-12-09: qty 2

## 2022-12-09 MED ORDER — MIDAZOLAM HCL 2 MG/2ML IJ SOLN
INTRAMUSCULAR | Status: AC | PRN
Start: 2022-12-09 — End: 2022-12-09
  Administered 2022-12-09: 1 mg via INTRAVENOUS

## 2022-12-09 MED ORDER — SODIUM CHLORIDE 0.9 % IV SOLN: INTRAVENOUS | Status: DC

## 2022-12-09 MED ORDER — FENTANYL CITRATE (PF) 100 MCG/2ML IJ SOLN
INTRAMUSCULAR | Status: AC | PRN
  Administered 2022-12-09: 25 ug via INTRAVENOUS

## 2022-12-09 MED ORDER — LIDOCAINE HCL 1 % IJ SOLN
INTRAMUSCULAR | Status: AC | PRN
  Administered 2022-12-09: 10 mL via INTRADERMAL

## 2022-12-09 NOTE — Discharge Instructions (Signed)

## 2022-12-09 NOTE — Procedures (Signed)
Interventional Radiology Procedure:   Indications: Anemia  Procedure: CT guided bone marrow biopsy  Findings: 2 aspirates and 1 core from right ilium  Complications: None     EBL: Minimal, less than 10 ml  Plan: Discharge to home in one hour.   Adam R. Henn, MD  Pager: [phone]    

## 2022-12-12 ENCOUNTER — Inpatient Hospital Stay: Payer: Medicare Other | Attending: Hematology & Oncology

## 2022-12-12 ENCOUNTER — Encounter: Payer: Self-pay | Admitting: Medical Oncology

## 2022-12-12 ENCOUNTER — Inpatient Hospital Stay: Payer: Medicare Other

## 2022-12-12 ENCOUNTER — Inpatient Hospital Stay: Payer: Medicare Other | Admitting: Medical Oncology

## 2022-12-12 VITALS — BP 123/65 | HR 90 | Temp 97.9°F | Resp 20 | Ht 68.0 in | Wt 191.4 lb

## 2022-12-12 DIAGNOSIS — I2510 Atherosclerotic heart disease of native coronary artery without angina pectoris: Secondary | ICD-10-CM | POA: Diagnosis not present

## 2022-12-12 DIAGNOSIS — K219 Gastro-esophageal reflux disease without esophagitis: Secondary | ICD-10-CM | POA: Diagnosis not present

## 2022-12-12 DIAGNOSIS — R0602 Shortness of breath: Secondary | ICD-10-CM | POA: Diagnosis not present

## 2022-12-12 DIAGNOSIS — D509 Iron deficiency anemia, unspecified: Secondary | ICD-10-CM | POA: Insufficient documentation

## 2022-12-12 DIAGNOSIS — Z902 Acquired absence of lung [part of]: Secondary | ICD-10-CM | POA: Diagnosis not present

## 2022-12-12 DIAGNOSIS — R9431 Abnormal electrocardiogram [ECG] [EKG]: Secondary | ICD-10-CM | POA: Diagnosis not present

## 2022-12-12 DIAGNOSIS — R110 Nausea: Secondary | ICD-10-CM | POA: Diagnosis not present

## 2022-12-12 DIAGNOSIS — Z87891 Personal history of nicotine dependence: Secondary | ICD-10-CM | POA: Diagnosis not present

## 2022-12-12 DIAGNOSIS — J9811 Atelectasis: Secondary | ICD-10-CM | POA: Diagnosis not present

## 2022-12-12 DIAGNOSIS — I3139 Other pericardial effusion (noninflammatory): Secondary | ICD-10-CM | POA: Diagnosis not present

## 2022-12-12 DIAGNOSIS — I712 Thoracic aortic aneurysm, without rupture, unspecified: Secondary | ICD-10-CM | POA: Diagnosis not present

## 2022-12-12 DIAGNOSIS — G4733 Obstructive sleep apnea (adult) (pediatric): Secondary | ICD-10-CM | POA: Diagnosis not present

## 2022-12-12 DIAGNOSIS — D696 Thrombocytopenia, unspecified: Secondary | ICD-10-CM

## 2022-12-12 DIAGNOSIS — I517 Cardiomegaly: Secondary | ICD-10-CM | POA: Diagnosis not present

## 2022-12-12 DIAGNOSIS — Z7951 Long term (current) use of inhaled steroids: Secondary | ICD-10-CM | POA: Diagnosis not present

## 2022-12-12 DIAGNOSIS — D649 Anemia, unspecified: Secondary | ICD-10-CM

## 2022-12-12 DIAGNOSIS — E039 Hypothyroidism, unspecified: Secondary | ICD-10-CM | POA: Diagnosis not present

## 2022-12-12 DIAGNOSIS — R918 Other nonspecific abnormal finding of lung field: Secondary | ICD-10-CM | POA: Insufficient documentation

## 2022-12-12 DIAGNOSIS — J90 Pleural effusion, not elsewhere classified: Secondary | ICD-10-CM | POA: Diagnosis not present

## 2022-12-12 DIAGNOSIS — R0600 Dyspnea, unspecified: Secondary | ICD-10-CM | POA: Diagnosis not present

## 2022-12-12 DIAGNOSIS — J439 Emphysema, unspecified: Secondary | ICD-10-CM | POA: Diagnosis not present

## 2022-12-12 DIAGNOSIS — I5033 Acute on chronic diastolic (congestive) heart failure: Secondary | ICD-10-CM | POA: Diagnosis not present

## 2022-12-12 DIAGNOSIS — R911 Solitary pulmonary nodule: Secondary | ICD-10-CM | POA: Diagnosis not present

## 2022-12-12 DIAGNOSIS — Z79899 Other long term (current) drug therapy: Secondary | ICD-10-CM | POA: Diagnosis not present

## 2022-12-12 DIAGNOSIS — I110 Hypertensive heart disease with heart failure: Secondary | ICD-10-CM | POA: Diagnosis not present

## 2022-12-12 DIAGNOSIS — Z9989 Dependence on other enabling machines and devices: Secondary | ICD-10-CM | POA: Diagnosis not present

## 2022-12-12 DIAGNOSIS — Z888 Allergy status to other drugs, medicaments and biological substances status: Secondary | ICD-10-CM | POA: Diagnosis not present

## 2022-12-12 DIAGNOSIS — E871 Hypo-osmolality and hyponatremia: Secondary | ICD-10-CM | POA: Diagnosis not present

## 2022-12-12 DIAGNOSIS — M7989 Other specified soft tissue disorders: Secondary | ICD-10-CM | POA: Diagnosis not present

## 2022-12-12 DIAGNOSIS — R7989 Other specified abnormal findings of blood chemistry: Secondary | ICD-10-CM | POA: Diagnosis not present

## 2022-12-12 DIAGNOSIS — J9601 Acute respiratory failure with hypoxia: Secondary | ICD-10-CM | POA: Diagnosis not present

## 2022-12-12 DIAGNOSIS — R059 Cough, unspecified: Secondary | ICD-10-CM | POA: Diagnosis not present

## 2022-12-12 DIAGNOSIS — J440 Chronic obstructive pulmonary disease with acute lower respiratory infection: Secondary | ICD-10-CM | POA: Diagnosis not present

## 2022-12-12 LAB — CBC WITH DIFFERENTIAL (CANCER CENTER ONLY)
Abs Immature Granulocytes: 0.09 10*3/uL — ABNORMAL HIGH (ref 0.00–0.07)
Basophils Absolute: 0.1 10*3/uL (ref 0.0–0.1)
Basophils Relative: 0 %
Eosinophils Absolute: 0.1 10*3/uL (ref 0.0–0.5)
Eosinophils Relative: 1 %
HCT: 28.2 % — ABNORMAL LOW (ref 39.0–52.0)
Hemoglobin: 9.2 g/dL — ABNORMAL LOW (ref 13.0–17.0)
Immature Granulocytes: 1 %
Lymphocytes Relative: 8 %
Lymphs Abs: 1 10*3/uL (ref 0.7–4.0)
MCH: 27.1 pg (ref 26.0–34.0)
MCHC: 32.6 g/dL (ref 30.0–36.0)
MCV: 83.2 fL (ref 80.0–100.0)
Monocytes Absolute: 0.9 10*3/uL (ref 0.1–1.0)
Monocytes Relative: 8 %
Neutro Abs: 10 10*3/uL — ABNORMAL HIGH (ref 1.7–7.7)
Neutrophils Relative %: 82 %
Platelet Count: 571 10*3/uL — ABNORMAL HIGH (ref 150–400)
RBC: 3.39 MIL/uL — ABNORMAL LOW (ref 4.22–5.81)
RDW: 15.8 % — ABNORMAL HIGH (ref 11.5–15.5)
WBC Count: 12.2 10*3/uL — ABNORMAL HIGH (ref 4.0–10.5)
nRBC: 0 % (ref 0.0–0.2)

## 2022-12-12 LAB — RETIC PANEL
Immature Retic Fract: 24.4 % — ABNORMAL HIGH (ref 2.3–15.9)
RBC.: 3.34 MIL/uL — ABNORMAL LOW (ref 4.22–5.81)
Retic Count, Absolute: 73.1 10*3/uL (ref 19.0–186.0)
Retic Ct Pct: 2.2 % (ref 0.4–3.1)
Reticulocyte Hemoglobin: 28.5 pg (ref >27.9)

## 2022-12-12 LAB — SAVE SMEAR(SSMR), FOR PROVIDER SLIDE REVIEW

## 2022-12-12 LAB — SAMPLE TO BLOOD BANK

## 2022-12-12 NOTE — Progress Notes (Addendum)
Hematology and Oncology Follow Up Visit  Walter Wilkerson [id number] [birth year] 72 y.o. 12/12/2022   Principle Diagnosis:  Multiple pulmonary nodules -history of tobacco use Normochromic normocytic anemia  Current Therapy:   Bone Marrow Biopsy Pending IV Iron -Venover 300 mg planned for today     Interim History:  Walter Wilkerson was seen for follow up earlier than planned due to symptoms of fatigue, SOB and worsening Hgb. He was seen on 11/24/2022 by his PCP for his symptoms and found to have an iron saturation of 9%, ferritin of 228, B12 of 747, folate of 14.9. When we saw him on 11/30/2022 his Hgb was 7.7 with an iron saturation of 4% and ferritin of 115. He had multiple myeloma labs drawn which did not show an M-protein but did show elevated IgA level (460), IgM (145), Globulin total (4.2), elevated kappa and lambda light chains (78.1 and 79.2 respectively). LDH was normal at 137.   He has a history of anemia of unknown cause which has significantly worsened over the past month. No known bleeding, bruising episodes. Last colonoscopy 05/14/2020,  due again in 2025. Last EGD 08/2022 showed mild chronic inactive gastritis which was negative for H. Pylori. At his last visit last week he had an iron infusion. He reports that SOB has worsened since this time.   Today he reports that his SOB has continues to worsen. He is unable to sleep unless he is propped up otherwise he feels like he is being smothered.  No known history of autoimmune disease. CTA was non-acute on 11/22/2022, no known cardiac cause per cardiology, no concern for potential carbon monoxide poisoning, no previous kidney issues, no history of DM2/DM1. EKG on 11/25/2022 showed normal sinus rhythm.   Understandably, he does have some fairly significant anxiety regarding current work up and how he is feeling but he reports that he is not normally an anxious person.   He denies any night sweats. No unintentional weight loss. No major  change in bowel or bladder habits.  He has had no nausea or vomiting.  He has had good appetite.  Not a vegetarian. No swelling of the legs.   No chest pains, new back pain or abdominal pain.   Overall, I would say that his performance status is probably ECOG 0.    Wt Readings from Last 3 Encounters:  12/12/22 191 lb 6.4 oz (86.8 kg)  12/09/22 191 lb 1.9 oz (86.7 kg)  12/05/22 191 lb 1.9 oz (86.7 kg)    Medications:  Current Outpatient Medications:    albuterol (VENTOLIN HFA) 108 (90 Base) MCG/ACT inhaler, Inhale 2 puffs into the lungs every 4 (four) hours as needed. (Patient taking differently: Inhale 2 puffs into the lungs every 4 (four) hours as needed for wheezing or shortness of breath.), Disp: 6.7 g, Rfl: 0   aspirin EC 81 MG tablet, Take 81 mg by mouth daily., Disp: , Rfl:    atorvastatin (LIPITOR) 40 MG tablet, Take 1 tablet (40 mg total) by mouth daily., Disp: 90 tablet, Rfl: 3   DULoxetine (CYMBALTA) 30 MG capsule, Take 1 capsule (30 mg total) by mouth daily., Disp: 90 capsule, Rfl: 1   ferrous sulfate 325 (65 FE) MG EC tablet, Take 1 tablet (325 mg total) by mouth 3 (three) times daily with meals., Disp: 90 tablet, Rfl: 11   Fluticasone-Umeclidin-Vilant (TRELEGY ELLIPTA) 100-62.5-25 MCG/ACT AEPB, Inhale 1 puff into the lungs daily in the afternoon., Disp: 60 each, Rfl: 5   gabapentin (NEURONTIN) 300  MG capsule, Take 1-2 tablets up to three times a day., Disp: 180 capsule, Rfl: 1   hydrALAZINE (APRESOLINE) 100 MG tablet, Take 1 tablet (100 mg total) by mouth 3 (three) times daily., Disp: 90 tablet, Rfl: 1   levothyroxine (SYNTHROID) 175 MCG tablet, Take 1 tablet (175 mcg total) by mouth daily before breakfast., Disp: 90 tablet, Rfl: 1   Multiple Vitamin (MULTIVITAMIN) tablet, Take 1 tablet by mouth daily., Disp: , Rfl:    mupirocin ointment (BACTROBAN) 2 %, Apply to back of scalp where bumps are as needed for up to 5 days., Disp: 30 g, Rfl: 0   pantoprazole (PROTONIX) 40 MG tablet,  Take 40 mg by mouth 2 (two) times daily., Disp: , Rfl:    valsartan-hydrochlorothiazide (DIOVAN-HCT) 320-25 MG tablet, TAKE ONE TABLET BY MOUTH EVERY DAY, Disp: 90 tablet, Rfl: 1  Allergies:  Allergies  Allergen Reactions   Pregabalin Other (See Comments)    Elevated LFTs   Atorvastatin Other (See Comments)    Myalgias.       Lipitor [Atorvastatin Calcium] Other (See Comments)    Myalgias.    Lisinopril Nausea Only   Merbromin Hives   Thimerosal (Thiomersal) Hives    Past Medical History, Surgical history, Social history, and Family History were reviewed and updated.  Review of Systems: Review of Systems  Constitutional: Negative.   HENT:  Negative.    Eyes: Negative.   Respiratory: Negative.    Cardiovascular: Negative.   Gastrointestinal: Negative.   Endocrine: Negative.   Genitourinary: Negative.    Musculoskeletal:  Positive for back pain (chronic unchanged).  Skin: Negative.   Neurological: Negative.   Hematological: Negative.   Psychiatric/Behavioral: Negative.      Physical Exam:  height is 5\' 8"  (1.727 m) and weight is 191 lb 6.4 oz (86.8 kg). His oral temperature is 97.9 F (36.6 C). His blood pressure is 123/65 and his pulse is 90. His respiration is 20 and oxygen saturation is 100%.   Wt Readings from Last 3 Encounters:  12/12/22 191 lb 6.4 oz (86.8 kg)  12/09/22 191 lb 1.9 oz (86.7 kg)  12/05/22 191 lb 1.9 oz (86.7 kg)    Physical Exam Vitals reviewed.  Constitutional:      General: Distressed: mild.     Comments: He is able to speak in full sentences.   HENT:     Head: Normocephalic and atraumatic.     Comments: Lips are normal in color Eyes:     Pupils: Pupils are equal, round, and reactive to light.  Cardiovascular:     Rate and Rhythm: Normal rate and regular rhythm.     Heart sounds: Normal heart sounds.  Pulmonary:     Effort: Pulmonary effort is normal.     Comments: Decrease sounds of the right lung fields. No crackles, rhonchi or  wheezes auscultated. Some increased effort of breathing with some accessory muscle use  Abdominal:     General: Bowel sounds are normal.     Palpations: Abdomen is soft.  Musculoskeletal:        General: No tenderness or deformity. Normal range of motion.     Cervical back: Normal range of motion.  Lymphadenopathy:     Cervical: No cervical adenopathy.  Skin:    General: Skin is warm and dry.     Findings: No erythema or rash.     Comments: No evidence of cyanosis  Neurological:     Mental Status: He is alert and oriented to person,  place, and time.  Psychiatric:        Behavior: Behavior normal.        Thought Content: Thought content normal.        Judgment: Judgment normal.     Lab Results  Component Value Date   WBC 12.2 (H) 12/12/2022   HGB 9.2 (L) 12/12/2022   HCT 28.2 (L) 12/12/2022   MCV 83.2 12/12/2022   PLT 571 (H) 12/12/2022     Chemistry      Component Value Date/Time   NA 133 (L) 11/30/2022 0909   NA 133 (L) 11/24/2022 0928   K 4.5 11/30/2022 0909   CL 100 11/30/2022 0909   CO2 25 11/30/2022 0909   BUN 17 11/30/2022 0909   BUN 15 11/24/2022 0928   CREATININE 1.15 11/30/2022 0909   CREATININE 1.44 (H) 01/11/2022 0945   GLU 101 04/01/2013 0000      Component Value Date/Time   CALCIUM 9.3 11/30/2022 0909   ALKPHOS 80 11/30/2022 0909   AST 12 (L) 11/30/2022 0909   ALT 10 11/30/2022 0909   BILITOT 0.5 11/30/2022 1610     Encounter Diagnoses  Name Primary?   Symptomatic anemia Yes   SOB (shortness of breath)    Impression and Plan: Mr. Sayer is a very nice 72 year old white male with symptomatic anemia. Anemia has worsened fairly quickly over the past month without evidence of bleeding.  Bone marrow biopsy results pending SOB has worsened despite administration of IV iron and improvement in his anemia. I do not believe that his SOB is solely due to his anemia SOB not improved with his COPD medications/inhalers.  No peripheral or abdominal edema.   I suspect fluid build up, an autoimmune component vs other. Unable to collect heavy metal screening today.  He is not improving despite correction of his anemia. I have suggested additional work up. He likely needs a repeat chest CT. I have discussed this with patient. Due to how he appears I have suggested the ER. Luckily vital signs are stable including his oxygen of 100%. They elect private vehicle transfer to the ER near their home.   Cancel unit of blood appointment for tomorrow  RTC 1 week MD or APP, labs (CBC w/, LDH, save smear, sample to blood bank, retic) + iron   Rushie Chestnut, PA-C [phone] AM

## 2022-12-13 ENCOUNTER — Inpatient Hospital Stay: Payer: Medicare Other

## 2022-12-14 ENCOUNTER — Telehealth: Payer: Self-pay | Admitting: Medical Oncology

## 2022-12-14 LAB — SURGICAL PATHOLOGY

## 2022-12-14 NOTE — Telephone Encounter (Signed)
Patient identity verified x 3 Spoke with patient and his wife regarding his BMB results that we have so far along with his immunoglobulins. At this time everything is suggesting a reactive origin along with iron deficiency. This is great news. He is still in the hospital being worked up for potential pneumonia vs acute onset CHF. We have him scheduled to be seen on Monday for iron and potential blood products on Tuesday

## 2022-12-16 ENCOUNTER — Telehealth: Payer: Self-pay

## 2022-12-16 NOTE — Patient Outreach (Signed)
error 

## 2022-12-16 NOTE — Transitions of Care (Post Inpatient/ED Visit) (Signed)
   12/16/2022  Name: Walter Wilkerson MRN: [id number] DOB: Nov 27, 1950  Today's TOC FU Call Status: Today's TOC FU Call Status:: Unsuccessful Call (1st Attempt) Unsuccessful Call (1st Attempt) Date: 12/16/22  Attempted to reach the patient regarding the most recent Inpatient/ED visit.  Follow Up Plan: Additional outreach attempts will be made to reach the patient to complete the Transitions of Care (Post Inpatient/ED visit) call.   Jodelle Gross RN, BSN, CCM RN Care Manager  Transitions of Care  [facility] - [facility]  [phone]

## 2022-12-19 ENCOUNTER — Inpatient Hospital Stay: Payer: Medicare Other | Admitting: Medical Oncology

## 2022-12-19 ENCOUNTER — Inpatient Hospital Stay: Payer: Medicare Other

## 2022-12-19 ENCOUNTER — Other Ambulatory Visit: Payer: Self-pay | Admitting: Physician Assistant

## 2022-12-19 ENCOUNTER — Encounter: Payer: Self-pay | Admitting: Medical Oncology

## 2022-12-19 ENCOUNTER — Telehealth: Payer: Self-pay

## 2022-12-19 ENCOUNTER — Other Ambulatory Visit: Payer: Self-pay | Admitting: Medical Oncology

## 2022-12-19 VITALS — BP 134/50 | HR 81 | Temp 97.7°F | Resp 19 | Ht 68.0 in | Wt 187.0 lb

## 2022-12-19 DIAGNOSIS — D649 Anemia, unspecified: Secondary | ICD-10-CM | POA: Diagnosis not present

## 2022-12-19 DIAGNOSIS — C3491 Malignant neoplasm of unspecified part of right bronchus or lung: Secondary | ICD-10-CM | POA: Diagnosis not present

## 2022-12-19 DIAGNOSIS — N183 Chronic kidney disease, stage 3 unspecified: Secondary | ICD-10-CM

## 2022-12-19 DIAGNOSIS — D696 Thrombocytopenia, unspecified: Secondary | ICD-10-CM

## 2022-12-19 DIAGNOSIS — R0602 Shortness of breath: Secondary | ICD-10-CM | POA: Diagnosis not present

## 2022-12-19 DIAGNOSIS — D509 Iron deficiency anemia, unspecified: Secondary | ICD-10-CM

## 2022-12-19 DIAGNOSIS — R918 Other nonspecific abnormal finding of lung field: Secondary | ICD-10-CM | POA: Diagnosis not present

## 2022-12-19 DIAGNOSIS — D631 Anemia in chronic kidney disease: Secondary | ICD-10-CM

## 2022-12-19 DIAGNOSIS — M47816 Spondylosis without myelopathy or radiculopathy, lumbar region: Secondary | ICD-10-CM

## 2022-12-19 DIAGNOSIS — Z87891 Personal history of nicotine dependence: Secondary | ICD-10-CM | POA: Diagnosis not present

## 2022-12-19 LAB — CBC WITH DIFFERENTIAL (CANCER CENTER ONLY)
Abs Immature Granulocytes: 0.05 10*3/uL (ref 0.00–0.07)
Basophils Absolute: 0.1 10*3/uL (ref 0.0–0.1)
Basophils Relative: 1 %
Eosinophils Absolute: 0.2 10*3/uL (ref 0.0–0.5)
Eosinophils Relative: 2 %
HCT: 27.2 % — ABNORMAL LOW (ref 39.0–52.0)
Hemoglobin: 8.9 g/dL — ABNORMAL LOW (ref 13.0–17.0)
Immature Granulocytes: 1 %
Lymphocytes Relative: 9 %
Lymphs Abs: 0.9 10*3/uL (ref 0.7–4.0)
MCH: 27.3 pg (ref 26.0–34.0)
MCHC: 32.7 g/dL (ref 30.0–36.0)
MCV: 83.4 fL (ref 80.0–100.0)
Monocytes Absolute: 0.7 10*3/uL (ref 0.1–1.0)
Monocytes Relative: 7 %
Neutro Abs: 8.1 10*3/uL — ABNORMAL HIGH (ref 1.7–7.7)
Neutrophils Relative %: 80 %
Platelet Count: 478 10*3/uL — ABNORMAL HIGH (ref 150–400)
RBC: 3.26 MIL/uL — ABNORMAL LOW (ref 4.22–5.81)
RDW: 16.4 % — ABNORMAL HIGH (ref 11.5–15.5)
WBC Count: 10.1 10*3/uL (ref 4.0–10.5)
nRBC: 0 % (ref 0.0–0.2)

## 2022-12-19 LAB — CMP (CANCER CENTER ONLY)
ALT: 8 U/L (ref 0–44)
AST: 10 U/L — ABNORMAL LOW (ref 15–41)
Albumin: 3.2 g/dL — ABNORMAL LOW (ref 3.5–5.0)
Alkaline Phosphatase: 84 U/L (ref 38–126)
Anion gap: 9 (ref 5–15)
BUN: 38 mg/dL — ABNORMAL HIGH (ref 8–23)
CO2: 26 mmol/L (ref 22–32)
Calcium: 9.4 mg/dL (ref 8.9–10.3)
Chloride: 98 mmol/L (ref 98–111)
Creatinine: 1.85 mg/dL — ABNORMAL HIGH (ref 0.61–1.24)
GFR, Estimated: 38 mL/min — ABNORMAL LOW (ref >60)
Glucose, Bld: 102 mg/dL — ABNORMAL HIGH (ref 70–99)
Potassium: 4.1 mmol/L (ref 3.5–5.1)
Sodium: 133 mmol/L — ABNORMAL LOW (ref 135–145)
Total Bilirubin: 0.7 mg/dL (ref <1.2)
Total Protein: 7.5 g/dL (ref 6.5–8.1)

## 2022-12-19 LAB — LACTATE DEHYDROGENASE: LDH: 141 U/L (ref 98–192)

## 2022-12-19 MED ORDER — SODIUM CHLORIDE 0.9 % IV SOLN
300.0000 mg | Freq: Once | INTRAVENOUS | Status: AC
  Administered 2022-12-19: 300 mg via INTRAVENOUS
  Filled 2022-12-19: qty 300

## 2022-12-19 MED ORDER — SODIUM CHLORIDE 0.9 % IV SOLN
INTRAVENOUS | Status: DC
Start: 2022-12-19 — End: 2022-12-19

## 2022-12-19 MED ORDER — LEVOTHYROXINE SODIUM 200 MCG PO TABS: 200.0000 ug | ORAL_TABLET | Freq: Every day | ORAL | 0 refills | Status: DC

## 2022-12-19 NOTE — Transitions of Care (Post Inpatient/ED Visit) (Signed)
   12/19/2022  Name: Delorean Duffy MRN: [id number] DOB: 05-23-1950  Today's TOC FU Call Status: Today's TOC FU Call Status:: Unsuccessful Call (2nd Attempt) Unsuccessful Call (2nd Attempt) Date: 12/19/22  Attempted to reach the patient regarding the most recent Inpatient/ED visit.  Follow Up Plan: Additional outreach attempts will be made to reach the patient to complete the Transitions of Care (Post Inpatient/ED visit) call.   Lonia Chimera, RN, BSN, CEN [employer]- Transition of Care Team.  [facility] [phone]

## 2022-12-19 NOTE — Patient Instructions (Signed)
Iron Sucrose Injection What is this medication? IRON SUCROSE (EYE ern SOO krose) treats low levels of iron (iron deficiency anemia) in people with kidney disease. Iron is a mineral that plays an important role in making red blood cells, which carry oxygen from your lungs to the rest of your body. This medicine may be used for other purposes; ask your health care provider or pharmacist if you have questions. COMMON BRAND NAME(S): Venofer What should I tell my care team before I take this medication? They need to know if you have any of these conditions: Anemia not caused by low iron levels Heart disease High levels of iron in the blood Kidney disease Liver disease An unusual or allergic reaction to iron, other medications, foods, dyes, or preservatives Pregnant or trying to get pregnant Breastfeeding How should I use this medication? This medication is for infusion into a vein. It is given in a hospital or clinic setting. Talk to your care team about the use of this medication in children. While this medication may be prescribed for children as young as 2 years for selected conditions, precautions do apply. Overdosage: If you think you have taken too much of this medicine contact a poison control center or emergency room at once. NOTE: This medicine is only for you. Do not share this medicine with others. What if I miss a dose? Keep appointments for follow-up doses. It is important not to miss your dose. Call your care team if you are unable to keep an appointment. What may interact with this medication? Do not take this medication with any of the following: Deferoxamine Dimercaprol Other iron products This medication may also interact with the following: Chloramphenicol Deferasirox This list may not describe all possible interactions. Give your health care provider a list of all the medicines, herbs, non-prescription drugs, or dietary supplements you use. Also tell them if you smoke,  drink alcohol, or use illegal drugs. Some items may interact with your medicine. What should I watch for while using this medication? Visit your care team regularly. Tell your care team if your symptoms do not start to get better or if they get worse. You may need blood work done while you are taking this medication. You may need to follow a special diet. Talk to your care team. Foods that contain iron include: whole grains/cereals, dried fruits, beans, or peas, leafy green vegetables, and organ meats (liver, kidney). What side effects may I notice from receiving this medication? Side effects that you should report to your care team as soon as possible: Allergic reactions--skin rash, itching, hives, swelling of the face, lips, tongue, or throat Low blood pressure--dizziness, feeling faint or lightheaded, blurry vision Shortness of breath Side effects that usually do not require medical attention (report to your care team if they continue or are bothersome): Flushing Headache Joint pain Muscle pain Nausea Pain, redness, or irritation at injection site This list may not describe all possible side effects. Call your doctor for medical advice about side effects. You may report side effects to FDA at 1-800-FDA-1088. Where should I keep my medication? This medication is given in a hospital or clinic. It will not be stored at home. NOTE: This sheet is a summary. It may not cover all possible information. If you have questions about this medicine, talk to your doctor, pharmacist, or health care provider.  2024 [employer]/Gold Standard (2022-06-03 00:00:00)

## 2022-12-19 NOTE — Progress Notes (Signed)
Hematology and Oncology Follow Up Visit  Walter Wilkerson [id number] 1950-11-13 72 y.o. 12/19/2022   Principle Diagnosis:  Multiple pulmonary nodules -history of tobacco use Normochromic normocytic anemia  Current Therapy:   Bone Marrow Biopsy Pending IV Iron -Venover 300 mg planned for today     Interim History:  Walter Wilkerson was seen for follow up earlier than planned due to symptoms of fatigue, SOB and worsening Hgb. He was seen on 11/24/2022 by his PCP for his symptoms and found to have an iron saturation of 9%, ferritin of 228, B12 of 747, folate of 14.9. When we saw him on 11/30/2022 his Hgb was 7.7 with an iron saturation of 4% and ferritin of 115. He had multiple myeloma labs drawn which did not show an M-protein but did show elevated IgA level (460), IgM (145), Globulin total (4.2), elevated kappa and lambda light chains (78.1 and 79.2 respectively). LDH was normal at 137.   He has a history of iron deficiency anemia which worsened over the past month. No known bleeding, bruising episodes. Last colonoscopy 05/14/2020,  due again in 2025. Last EGD 08/2022 showed mild chronic inactive gastritis which was negative for H. Pylori.   He has been started on IV iron. His first infusion was on 12/05/2022. He tolerated this well although his SOB may have worsened a bit after his IVF.   Since his last visit to our office he was seen in the hospital for his SOB. He was admitted for 4 days starting on 12/12/2022 for CHF and acute respiratory failure with hypoxia. He was treated with IV lasix and he was discharged home with 20 mg daily. He also has a referral to pulmonology and cardiology.   No known history of autoimmune disease. CTA was non-acute on 11/22/2022, no known cardiac cause per cardiology, no concern for potential carbon monoxide poisoning, no previous kidney issues, no history of DM2/DM1. EKG on 11/25/2022 showed normal sinus rhythm.   He denies any night sweats. No unintentional weight  loss. No major change in bowel or bladder habits.  He has had no nausea or vomiting.  He has had good appetite.  Not a vegetarian. No swelling of the legs.   No chest pains, new back pain or abdominal pain.   Overall, I would say that his performance status is probably ECOG 0.    Wt Readings from Last 3 Encounters:  12/19/22 187 lb (84.8 kg)  12/12/22 191 lb 6.4 oz (86.8 kg)  12/09/22 191 lb 1.9 oz (86.7 kg)    Medications:  Current Outpatient Medications:    albuterol (VENTOLIN HFA) 108 (90 Base) MCG/ACT inhaler, Inhale 2 puffs into the lungs every 4 (four) hours as needed. (Patient taking differently: Inhale 2 puffs into the lungs every 4 (four) hours as needed for wheezing or shortness of breath.), Disp: 6.7 g, Rfl: 0   aspirin EC 81 MG tablet, Take 81 mg by mouth daily., Disp: , Rfl:    atorvastatin (LIPITOR) 40 MG tablet, Take 1 tablet (40 mg total) by mouth daily., Disp: 90 tablet, Rfl: 3   doxycycline (VIBRA-TABS) 100 MG tablet, Take 100 mg by mouth 2 (two) times daily., Disp: , Rfl:    DULoxetine (CYMBALTA) 30 MG capsule, Take 1 capsule (30 mg total) by mouth daily., Disp: 90 capsule, Rfl: 1   ferrous sulfate 325 (65 FE) MG EC tablet, Take 1 tablet (325 mg total) by mouth 3 (three) times daily with meals., Disp: 90 tablet, Rfl: 11   Fluticasone-Umeclidin-Vilant (  TRELEGY ELLIPTA) 100-62.5-25 MCG/ACT AEPB, Inhale 1 puff into the lungs daily in the afternoon., Disp: 60 each, Rfl: 5   furosemide (LASIX) 20 MG tablet, Take 20 mg by mouth every other day as needed for edema., Disp: , Rfl:    gabapentin (NEURONTIN) 300 MG capsule, Take 1-2 tablets up to three times a day., Disp: 180 capsule, Rfl: 1   hydrALAZINE (APRESOLINE) 100 MG tablet, Take 1 tablet (100 mg total) by mouth 3 (three) times daily., Disp: 90 tablet, Rfl: 1   levothyroxine (SYNTHROID) 200 MCG tablet, Take 1 tablet (200 mcg total) by mouth daily before breakfast., Disp: 30 tablet, Rfl: 0   Multiple Vitamin (MULTIVITAMIN)  tablet, Take 1 tablet by mouth daily., Disp: , Rfl:    mupirocin ointment (BACTROBAN) 2 %, Apply to back of scalp where bumps are as needed for up to 5 days., Disp: 30 g, Rfl: 0   pantoprazole (PROTONIX) 40 MG tablet, Take 40 mg by mouth 2 (two) times daily., Disp: , Rfl:    predniSONE (DELTASONE) 1 MG tablet, Take 1 mg by mouth daily with breakfast., Disp: , Rfl:    valsartan-hydrochlorothiazide (DIOVAN-HCT) 320-25 MG tablet, TAKE ONE TABLET BY MOUTH EVERY DAY, Disp: 90 tablet, Rfl: 1 No current facility-administered medications for this visit.  Facility-Administered Medications Ordered in Other Visits:    0.9 %  sodium chloride infusion, , Intravenous, Continuous, Rushie Chestnut, PA-C, Last Rate: 10 mL/hr at 12/19/22 1212, New Bag at 12/19/22 1212  Allergies:  Allergies  Allergen Reactions   Pregabalin Other (See Comments)    Elevated LFTs   Atorvastatin Other (See Comments)    Myalgias.       Lipitor [Atorvastatin Calcium] Other (See Comments)    Myalgias.    Lisinopril Nausea Only   Merbromin Hives   Thimerosal (Thiomersal) Hives    Past Medical History, Surgical history, Social history, and Family History were reviewed and updated.  Review of Systems: Review of Systems  Constitutional: Negative.   HENT:  Negative.    Eyes: Negative.   Respiratory: Negative.    Cardiovascular: Negative.   Gastrointestinal: Negative.   Endocrine: Negative.   Genitourinary: Negative.    Musculoskeletal:  Positive for back pain (chronic unchanged).  Skin: Negative.   Neurological: Negative.   Hematological: Negative.   Psychiatric/Behavioral: Negative.      Physical Exam:  height is 5\' 8"  (1.727 m) and weight is 187 lb (84.8 kg). His oral temperature is 97.7 F (36.5 C). His blood pressure is 134/50 (abnormal) and his pulse is 81. His respiration is 19 and oxygen saturation is 98%.   Wt Readings from Last 3 Encounters:  12/19/22 187 lb (84.8 kg)  12/12/22 191 lb 6.4 oz (86.8  kg)  12/09/22 191 lb 1.9 oz (86.7 kg)    Physical Exam Vitals reviewed.  Constitutional:      General: He is not in acute distress.    Comments: He is able to speak in full sentences.   HENT:     Head: Normocephalic and atraumatic.     Comments: Lips are normal in color Eyes:     Pupils: Pupils are equal, round, and reactive to light.  Cardiovascular:     Rate and Rhythm: Normal rate and regular rhythm.     Heart sounds: Normal heart sounds.  Pulmonary:     Effort: Pulmonary effort is normal. No respiratory distress.     Breath sounds: Normal breath sounds. No stridor. No wheezing, rhonchi or rales.  Abdominal:     General: Bowel sounds are normal.     Palpations: Abdomen is soft.  Musculoskeletal:        General: No tenderness or deformity. Normal range of motion.     Cervical back: Normal range of motion.  Lymphadenopathy:     Cervical: No cervical adenopathy.  Skin:    General: Skin is warm and dry.     Findings: No erythema or rash.     Comments: No evidence of cyanosis  Neurological:     Mental Status: He is alert and oriented to person, place, and time.  Psychiatric:        Behavior: Behavior normal.        Thought Content: Thought content normal.        Judgment: Judgment normal.     Lab Results  Component Value Date   WBC 10.1 12/19/2022   HGB 8.9 (L) 12/19/2022   HCT 27.2 (L) 12/19/2022   MCV 83.4 12/19/2022   PLT 478 (H) 12/19/2022     Chemistry      Component Value Date/Time   NA 133 (L) 12/19/2022 1046   NA 133 (L) 11/24/2022 0928   K 4.1 12/19/2022 1046   CL 98 12/19/2022 1046   CO2 26 12/19/2022 1046   BUN 38 (H) 12/19/2022 1046   BUN 15 11/24/2022 0928   CREATININE 1.85 (H) 12/19/2022 1046   CREATININE 1.44 (H) 01/11/2022 0945   GLU 101 04/01/2013 0000      Component Value Date/Time   CALCIUM 9.4 12/19/2022 1046   ALKPHOS 84 12/19/2022 1046   AST 10 (L) 12/19/2022 1046   ALT 8 12/19/2022 1046   BILITOT 0.7 12/19/2022 1046      Encounter Diagnoses  Name Primary?   Symptomatic anemia Yes   SOB (shortness of breath)    Iron deficiency anemia, unspecified iron deficiency anemia type    Thrombocytopenia (HCC)    Anemia in stage 3 chronic kidney disease, unspecified whether stage 3a or 3b CKD (HCC)    Non-small cell cancer of right lung (HCC)     Impression and Plan: Mr. Brazile is a very nice 72 year old white male with symptomatic anemia. Anemia has worsened fairly quickly over the past month without evidence of bleeding.  Bone marrow biopsy results suggestive of a reactive process and iron deficiency.  Our goal is to get his iron levels to the appropriate range, get him back into GI Platelets are improving with iron He will also continue seeing cardiology/pulmonology.   Iron today Cancel unit of blood appointment for tomorrow  RTC 1 week MD, labs (CBC w/, LDH, save smear, sample to blood bank, retic) + iron   Rushie Chestnut, PA-C 12/9/20242:27 PM

## 2022-12-20 ENCOUNTER — Telehealth: Payer: Self-pay

## 2022-12-20 ENCOUNTER — Encounter (HOSPITAL_COMMUNITY): Payer: Self-pay | Admitting: Hematology & Oncology

## 2022-12-20 NOTE — Transitions of Care (Post Inpatient/ED Visit) (Signed)
   12/20/2022  Name: Walter Wilkerson MRN: [id number] DOB: [birth year]  Today's TOC FU Call Status: Today's TOC FU Call Status:: Unsuccessful Call (3rd Attempt) Unsuccessful Call (3rd Attempt) Date: 12/20/22  Attempted to reach the patient regarding the most recent Inpatient/ED visit.  Follow Up Plan: No further outreach attempts will be made at this time. We have been unable to contact the patient.  Jodelle Gross RN, BSN, CCM RN Care Manager  Transitions of Care  [facility] - [facility]  [phone]

## 2022-12-21 LAB — HEAVY METALS, BLOOD
Arsenic: 1 ug/L (ref 0–9)
Lead: <1 ug/dL (ref 0.0–3.4)
Mercury: <1 ug/L (ref 0.0–14.9)

## 2022-12-22 ENCOUNTER — Other Ambulatory Visit: Payer: Self-pay | Admitting: Medical Oncology

## 2022-12-22 DIAGNOSIS — D696 Thrombocytopenia, unspecified: Secondary | ICD-10-CM

## 2022-12-22 DIAGNOSIS — R0602 Shortness of breath: Secondary | ICD-10-CM

## 2022-12-28 ENCOUNTER — Inpatient Hospital Stay: Payer: Medicare Other | Admitting: Hematology & Oncology

## 2022-12-28 ENCOUNTER — Inpatient Hospital Stay: Payer: Medicare Other

## 2022-12-28 VITALS — BP 145/75 | HR 68 | Temp 97.7°F | Resp 18

## 2022-12-28 DIAGNOSIS — D631 Anemia in chronic kidney disease: Secondary | ICD-10-CM

## 2022-12-28 DIAGNOSIS — R918 Other nonspecific abnormal finding of lung field: Secondary | ICD-10-CM | POA: Diagnosis not present

## 2022-12-28 DIAGNOSIS — D509 Iron deficiency anemia, unspecified: Secondary | ICD-10-CM | POA: Diagnosis not present

## 2022-12-28 DIAGNOSIS — Z87891 Personal history of nicotine dependence: Secondary | ICD-10-CM | POA: Diagnosis not present

## 2022-12-28 DIAGNOSIS — D649 Anemia, unspecified: Secondary | ICD-10-CM

## 2022-12-28 DIAGNOSIS — D696 Thrombocytopenia, unspecified: Secondary | ICD-10-CM

## 2022-12-28 LAB — CBC WITH DIFFERENTIAL (CANCER CENTER ONLY)
Abs Immature Granulocytes: 0.04 10*3/uL (ref 0.00–0.07)
Basophils Absolute: 0.1 10*3/uL (ref 0.0–0.1)
Basophils Relative: 2 %
Eosinophils Absolute: 0.3 10*3/uL (ref 0.0–0.5)
Eosinophils Relative: 4 %
HCT: 27.9 % — ABNORMAL LOW (ref 39.0–52.0)
Hemoglobin: 9 g/dL — ABNORMAL LOW (ref 13.0–17.0)
Immature Granulocytes: 1 %
Lymphocytes Relative: 15 %
Lymphs Abs: 1 10*3/uL (ref 0.7–4.0)
MCH: 27 pg (ref 26.0–34.0)
MCHC: 32.3 g/dL (ref 30.0–36.0)
MCV: 83.8 fL (ref 80.0–100.0)
Monocytes Absolute: 0.5 10*3/uL (ref 0.1–1.0)
Monocytes Relative: 8 %
Neutro Abs: 4.7 10*3/uL (ref 1.7–7.7)
Neutrophils Relative %: 70 %
Platelet Count: 450 10*3/uL — ABNORMAL HIGH (ref 150–400)
RBC: 3.33 MIL/uL — ABNORMAL LOW (ref 4.22–5.81)
RDW: 17.7 % — ABNORMAL HIGH (ref 11.5–15.5)
WBC Count: 6.7 10*3/uL (ref 4.0–10.5)
nRBC: 0 % (ref 0.0–0.2)

## 2022-12-28 LAB — SAMPLE TO BLOOD BANK

## 2022-12-28 LAB — CMP (CANCER CENTER ONLY)
ALT: 7 U/L (ref 0–44)
AST: 10 U/L — ABNORMAL LOW (ref 15–41)
Albumin: 3.3 g/dL — ABNORMAL LOW (ref 3.5–5.0)
Alkaline Phosphatase: 69 U/L (ref 38–126)
Anion gap: 10 (ref 5–15)
BUN: 20 mg/dL (ref 8–23)
CO2: 24 mmol/L (ref 22–32)
Calcium: 9.6 mg/dL (ref 8.9–10.3)
Chloride: 104 mmol/L (ref 98–111)
Creatinine: 1.11 mg/dL (ref 0.61–1.24)
GFR, Estimated: >60 mL/min (ref >60)
Glucose, Bld: 107 mg/dL — ABNORMAL HIGH (ref 70–99)
Potassium: 4.1 mmol/L (ref 3.5–5.1)
Sodium: 138 mmol/L (ref 135–145)
Total Bilirubin: 0.4 mg/dL (ref <1.2)
Total Protein: 7.2 g/dL (ref 6.5–8.1)

## 2022-12-28 LAB — SAVE SMEAR(SSMR), FOR PROVIDER SLIDE REVIEW

## 2022-12-28 LAB — LACTATE DEHYDROGENASE: LDH: 130 U/L (ref 98–192)

## 2022-12-28 MED ORDER — SODIUM CHLORIDE 0.9 % IV SOLN
300.0000 mg | Freq: Once | INTRAVENOUS | Status: AC
  Administered 2022-12-28: 300 mg via INTRAVENOUS
  Filled 2022-12-28: qty 300

## 2022-12-28 MED ORDER — SODIUM CHLORIDE 0.9 % IV SOLN: INTRAVENOUS | Status: DC

## 2022-12-28 NOTE — Patient Instructions (Signed)
Iron Sucrose Injection What is this medication? IRON SUCROSE (EYE ern SOO krose) treats low levels of iron (iron deficiency anemia) in people with kidney disease. Iron is a mineral that plays an important role in making red blood cells, which carry oxygen from your lungs to the rest of your body. This medicine may be used for other purposes; ask your health care provider or pharmacist if you have questions. COMMON BRAND NAME(S): Venofer What should I tell my care team before I take this medication? They need to know if you have any of these conditions: Anemia not caused by low iron levels Heart disease High levels of iron in the blood Kidney disease Liver disease An unusual or allergic reaction to iron, other medications, foods, dyes, or preservatives Pregnant or trying to get pregnant Breastfeeding How should I use this medication? This medication is for infusion into a vein. It is given in a hospital or clinic setting. Talk to your care team about the use of this medication in children. While this medication may be prescribed for children as young as 2 years for selected conditions, precautions do apply. Overdosage: If you think you have taken too much of this medicine contact a poison control center or emergency room at once. NOTE: This medicine is only for you. Do not share this medicine with others. What if I miss a dose? Keep appointments for follow-up doses. It is important not to miss your dose. Call your care team if you are unable to keep an appointment. What may interact with this medication? Do not take this medication with any of the following: Deferoxamine Dimercaprol Other iron products This medication may also interact with the following: Chloramphenicol Deferasirox This list may not describe all possible interactions. Give your health care provider a list of all the medicines, herbs, non-prescription drugs, or dietary supplements you use. Also tell them if you smoke,  drink alcohol, or use illegal drugs. Some items may interact with your medicine. What should I watch for while using this medication? Visit your care team regularly. Tell your care team if your symptoms do not start to get better or if they get worse. You may need blood work done while you are taking this medication. You may need to follow a special diet. Talk to your care team. Foods that contain iron include: whole grains/cereals, dried fruits, beans, or peas, leafy green vegetables, and organ meats (liver, kidney). What side effects may I notice from receiving this medication? Side effects that you should report to your care team as soon as possible: Allergic reactions--skin rash, itching, hives, swelling of the face, lips, tongue, or throat Low blood pressure--dizziness, feeling faint or lightheaded, blurry vision Shortness of breath Side effects that usually do not require medical attention (report to your care team if they continue or are bothersome): Flushing Headache Joint pain Muscle pain Nausea Pain, redness, or irritation at injection site This list may not describe all possible side effects. Call your doctor for medical advice about side effects. You may report side effects to FDA at 1-800-FDA-1088. Where should I keep my medication? This medication is given in a hospital or clinic. It will not be stored at home. NOTE: This sheet is a summary. It may not cover all possible information. If you have questions about this medicine, talk to your doctor, pharmacist, or health care provider.  2024 [employer]/Gold Standard (2022-06-03 00:00:00)

## 2023-01-12 ENCOUNTER — Encounter: Payer: Self-pay | Admitting: Family Medicine

## 2023-01-12 ENCOUNTER — Ambulatory Visit (INDEPENDENT_AMBULATORY_CARE_PROVIDER_SITE_OTHER): Payer: Medicare Other | Admitting: Family Medicine

## 2023-01-12 VITALS — BP 125/64 | HR 88 | Ht 68.0 in | Wt 192.0 lb

## 2023-01-12 DIAGNOSIS — J019 Acute sinusitis, unspecified: Secondary | ICD-10-CM | POA: Diagnosis not present

## 2023-01-12 DIAGNOSIS — M67432 Ganglion, left wrist: Secondary | ICD-10-CM | POA: Diagnosis not present

## 2023-01-12 DIAGNOSIS — R21 Rash and other nonspecific skin eruption: Secondary | ICD-10-CM

## 2023-01-12 LAB — POC COVID19 BINAXNOW: SARS Coronavirus 2 Ag: NEGATIVE

## 2023-01-12 MED ORDER — AZITHROMYCIN 250 MG PO TABS: ORAL_TABLET | ORAL | 0 refills | Status: AC

## 2023-01-12 MED ORDER — CLOTRIMAZOLE-BETAMETHASONE 1-0.05 % EX CREA: 1.0000 | TOPICAL_CREAM | Freq: Two times a day (BID) | CUTANEOUS | 0 refills | Status: DC

## 2023-01-12 NOTE — Progress Notes (Signed)
 Called pt and advised him that his COVID test was negative. Dr. Linford Arnold is sending over an ABX for him to take. However, advised him that may still be viral.

## 2023-01-12 NOTE — Progress Notes (Signed)
 Acute Office Visit  Subjective:     Patient ID: Walter Wilkerson, male    DOB: 1950/01/11, 73 y.o.   MRN: [id number]  Chief Complaint  Patient presents with   Rash   Sinusitis    HPI Patient is in today for rash in his right temple along the hairline.  He said he had something similar previously on the posterior scalp area and was given a prescription by [place] his PCP and that seemed to help but he just was not sure if he could use the mupirocin  on that area or not.  It has been there for a week.  For 2 days he has had some sinus drainage and sore throat.  No fevers chills or sweats he has a history of recurrent sinusitis and feels like he is getting another sinus infection.  On his left wrist he noticed a bump on the posterior wrist about 6 days ago it has been sore and occasionally will cause shooting pain going up and down his arm.    ROS      Objective:    BP 125/64   Pulse 88   Ht 5' 8 (1.727 m)   Wt 192 lb (87.1 kg)   SpO2 98%   BMI 29.19 kg/m    Physical Exam Constitutional:      Appearance: Normal appearance.  HENT:     Head: Normocephalic and atraumatic.     Right Ear: Tympanic membrane, ear canal and external ear normal. There is no impacted cerumen.     Left Ear: Tympanic membrane, ear canal and external ear normal. There is no impacted cerumen.     Nose: Nose normal.     Mouth/Throat:     Pharynx: Oropharynx is clear. No oropharyngeal exudate.     Comments: Some white mucus in the left posterior pharynx. Eyes:     Conjunctiva/sclera: Conjunctivae normal.  Cardiovascular:     Rate and Rhythm: Normal rate and regular rhythm.     Comments: 2/5 SEM Pulmonary:     Effort: Pulmonary effort is normal.     Breath sounds: Normal breath sounds.  Musculoskeletal:     Cervical back: Neck supple. No tenderness.     Comments: Dorsum of the left wrist is a small approximately half a centimeter palpable nodule.  Lymphadenopathy:     Cervical: No cervical  adenopathy.  Skin:    General: Skin is warm and dry.     Comments: On his right temple he has a pink patch with scale just under the sideburn area.  Neurological:     Mental Status: He is alert and oriented to person, place, and time.  Psychiatric:        Mood and Affect: Mood normal.     Results for orders placed or performed in visit on 01/12/23  POC COVID-19  Result Value Ref Range   SARS Coronavirus 2 Ag Negative Negative        Assessment & Plan:   Problem List Items Addressed This Visit   None Visit Diagnoses       Ganglion cyst of wrist, left    -  Primary     Acute non-recurrent sinusitis, unspecified location       Relevant Medications   azithromycin  (ZITHROMAX ) 250 MG tablet   Other Relevant Orders   POC COVID-19 (Completed)     Rash       Relevant Medications   clotrimazole -betamethasone  (LOTRISONE ) cream       Ganglion cyst,  dorsum of left wrist-we discussed moving his watch to his opposite arm and using an Ace wrap for compression over the next couple of weeks.  If not improving with just some extra support and avoiding any excess strain on the wrist then recommend follow-up with our sports med doc for further treatment options especially if it still causing pain.  Meds ordered this encounter  Medications   clotrimazole -betamethasone  (LOTRISONE ) cream    Sig: Apply 1 Application topically 2 (two) times daily. X 10 days    Dispense:  30 g    Refill:  0   azithromycin  (ZITHROMAX ) 250 MG tablet    Sig: 2 Ttabs PO on Day 1, then one a day x 4 days.    Dispense:  6 tablet    Refill:  0    Return if symptoms worsen or fail to improve.  Dorothyann Byars, MD

## 2023-01-12 NOTE — Patient Instructions (Signed)
 Avoid trying to wear your watch on that wrist and recommend an Ace wrap daily for extra support and comfort for the next 2 weeks if not improving then please schedule appoint with our sports medicine doc, Dr. ONEIDA here in our office for further workup and evaluation.

## 2023-01-13 ENCOUNTER — Other Ambulatory Visit: Payer: Self-pay

## 2023-01-13 DIAGNOSIS — D696 Thrombocytopenia, unspecified: Secondary | ICD-10-CM

## 2023-01-13 DIAGNOSIS — D509 Iron deficiency anemia, unspecified: Secondary | ICD-10-CM

## 2023-01-16 ENCOUNTER — Inpatient Hospital Stay: Payer: Medicare Other | Attending: Hematology & Oncology

## 2023-01-16 ENCOUNTER — Other Ambulatory Visit: Payer: Self-pay

## 2023-01-16 ENCOUNTER — Inpatient Hospital Stay: Payer: Medicare Other | Admitting: Hematology & Oncology

## 2023-01-16 ENCOUNTER — Encounter: Payer: Self-pay | Admitting: Hematology & Oncology

## 2023-01-16 ENCOUNTER — Encounter: Payer: Self-pay | Admitting: *Deleted

## 2023-01-16 VITALS — BP 167/70 | HR 87 | Temp 98.1°F | Resp 20 | Ht 68.0 in | Wt 188.0 lb

## 2023-01-16 DIAGNOSIS — D509 Iron deficiency anemia, unspecified: Secondary | ICD-10-CM

## 2023-01-16 DIAGNOSIS — D696 Thrombocytopenia, unspecified: Secondary | ICD-10-CM

## 2023-01-16 DIAGNOSIS — N189 Chronic kidney disease, unspecified: Secondary | ICD-10-CM | POA: Diagnosis not present

## 2023-01-16 DIAGNOSIS — D631 Anemia in chronic kidney disease: Secondary | ICD-10-CM | POA: Insufficient documentation

## 2023-01-16 DIAGNOSIS — R918 Other nonspecific abnormal finding of lung field: Secondary | ICD-10-CM | POA: Diagnosis not present

## 2023-01-16 DIAGNOSIS — E611 Iron deficiency: Secondary | ICD-10-CM | POA: Insufficient documentation

## 2023-01-16 HISTORY — DX: Anemia in chronic kidney disease: D63.1

## 2023-01-16 LAB — CBC WITH DIFFERENTIAL (CANCER CENTER ONLY)
Abs Immature Granulocytes: 0 10*3/uL (ref 0.00–0.07)
Band Neutrophils: 0 %
Basophils Absolute: 0.1 10*3/uL (ref 0.0–0.1)
Basophils Relative: 1 %
Blasts: 0 %
Eosinophils Absolute: 0.2 10*3/uL (ref 0.0–0.5)
Eosinophils Relative: 5 %
HCT: 29.5 % — ABNORMAL LOW (ref 39.0–52.0)
Hemoglobin: 9.8 g/dL — ABNORMAL LOW (ref 13.0–17.0)
Immature Granulocytes: 0 %
Lymphocytes Relative: 20 %
Lymphs Abs: 1 10*3/uL (ref 0.7–4.0)
MCH: 28.5 pg (ref 26.0–34.0)
MCHC: 33.2 g/dL (ref 30.0–36.0)
MCV: 85.8 fL (ref 80.0–100.0)
Metamyelocytes Relative: 0 %
Monocytes Absolute: 0.6 10*3/uL (ref 0.1–1.0)
Monocytes Relative: 12 %
Myelocytes: 0 %
Neutro Abs: 3 10*3/uL (ref 1.7–7.7)
Neutrophils Relative %: 62 %
Other: 0 %
Platelet Count: 294 10*3/uL (ref 150–400)
Promyelocytes Relative: 0 %
RBC: 3.44 MIL/uL — ABNORMAL LOW (ref 4.22–5.81)
RDW: 21.5 % — ABNORMAL HIGH (ref 11.5–15.5)
WBC Count: 4.9 10*3/uL (ref 4.0–10.5)
nRBC: 0 % (ref 0.0–0.2)
nRBC: 0 /100{WBCs}

## 2023-01-16 LAB — CMP (CANCER CENTER ONLY)
ALT: 8 U/L (ref 0–44)
AST: 9 U/L — ABNORMAL LOW (ref 15–41)
Albumin: 3.5 g/dL (ref 3.5–5.0)
Alkaline Phosphatase: 85 U/L (ref 38–126)
Anion gap: 8 (ref 5–15)
BUN: 16 mg/dL (ref 8–23)
CO2: 26 mmol/L (ref 22–32)
Calcium: 9.2 mg/dL (ref 8.9–10.3)
Chloride: 104 mmol/L (ref 98–111)
Creatinine: 1.23 mg/dL (ref 0.61–1.24)
GFR, Estimated: >60 mL/min (ref >60)
Glucose, Bld: 100 mg/dL — ABNORMAL HIGH (ref 70–99)
Potassium: 3.7 mmol/L (ref 3.5–5.1)
Sodium: 138 mmol/L (ref 135–145)
Total Bilirubin: 0.4 mg/dL (ref 0.0–1.2)
Total Protein: 7.1 g/dL (ref 6.5–8.1)

## 2023-01-16 LAB — IRON AND IRON BINDING CAPACITY (CC-WL,HP ONLY)
Iron: 46 ug/dL (ref 45–182)
Saturation Ratios: 18 % (ref 17.9–39.5)
TIBC: 262 ug/dL (ref 250–450)
UIBC: 216 ug/dL (ref 117–376)

## 2023-01-16 LAB — RETICULOCYTES
Immature Retic Fract: 12 % (ref 2.3–15.9)
RBC.: 3.43 MIL/uL — ABNORMAL LOW (ref 4.22–5.81)
Retic Count, Absolute: 59 10*3/uL (ref 19.0–186.0)
Retic Ct Pct: 1.7 % (ref 0.4–3.1)

## 2023-01-16 LAB — LACTATE DEHYDROGENASE: LDH: 142 U/L (ref 98–192)

## 2023-01-16 LAB — SAMPLE TO BLOOD BANK

## 2023-01-16 LAB — SAVE SMEAR(SSMR), FOR PROVIDER SLIDE REVIEW

## 2023-01-16 NOTE — Progress Notes (Signed)
 Hematology and Oncology Follow Up Visit  Walter Wilkerson [id number] [birth year] 73 y.o. 01/16/2023   Principle Diagnosis:  Multiple pulmonary nodules -history of tobacco use Anemia of iron  deficiency  Anemia of erythropoietin  deficiency  Current Therapy:   Bone Marrow Biopsy Pending IV Iron  -Venover 300 mg given 12/28/2022  Aranesp  300 mcg subcu for hemoglobin less than 11     Interim History:  Walter Wilkerson was seen for follow up.  He adds he has been quite busy since I last saw him.  We were worried about his anemia.  He subsequently underwent a bone marrow biopsy and aspirate.  This was done on 12/09/2022.  The pathology report [id number]) showed a hypercellular marrow with trilineage hematopoiesis.  There is no evidence of a hematologic malignancy.  There was some moderate poikilocytosis.  There were abundant megakaryocytes.  His cytogenetics were all normal.  We are sending off the NGS analysis.  Of note, his erythropoietin  level is only 30.  As such, I think that he probably needs to have ESA to try to help get his hemoglobin better.  He has been getting IV iron .  His hemoglobin is trending upward slowly.  He still has a quite suppressed reticulocyte count.  He has had no problem with fever.  He has had no cough.  He has had no change in bowel or bladder habits.  He has had no nausea or vomiting.  He had no problems over the Holiday season.  Currently, I would have said that his performance status is probably ECOG 1. .   Wt Readings from Last 3 Encounters:  01/16/23 188 lb 0.6 oz (85.3 kg)  01/12/23 192 lb (87.1 kg)  12/19/22 187 lb (84.8 kg)    Medications:  Current Outpatient Medications:    albuterol  (VENTOLIN  HFA) 108 (90 Base) MCG/ACT inhaler, Inhale 2 puffs into the lungs every 4 (four) hours as needed. (Patient taking differently: Inhale 2 puffs into the lungs every 4 (four) hours as needed for wheezing or shortness of breath.), Disp: 6.7 g, Rfl: 0   aspirin  EC  81 MG tablet, Take 81 mg by mouth daily., Disp: , Rfl:    atorvastatin  (LIPITOR) 40 MG tablet, Take 1 tablet (40 mg total) by mouth daily., Disp: 90 tablet, Rfl: 3   azithromycin  (ZITHROMAX ) 250 MG tablet, 2 Ttabs PO on Day 1, then one a day x 4 days., Disp: 6 tablet, Rfl: 0   clotrimazole -betamethasone  (LOTRISONE ) cream, Apply 1 Application topically 2 (two) times daily. X 10 days, Disp: 30 g, Rfl: 0   DULoxetine  (CYMBALTA ) 30 MG capsule, Take 1 capsule (30 mg total) by mouth daily., Disp: 90 capsule, Rfl: 1   ferrous sulfate  325 (65 FE) MG EC tablet, Take 1 tablet (325 mg total) by mouth 3 (three) times daily with meals., Disp: 90 tablet, Rfl: 11   Fluticasone -Umeclidin-Vilant (TRELEGY ELLIPTA ) 100-62.5-25 MCG/ACT AEPB, Inhale 1 puff into the lungs daily in the afternoon., Disp: 60 each, Rfl: 5   gabapentin  (NEURONTIN ) 300 MG capsule, TAKE 1-2 CAPSULES BY MOUTH UP TO THREE TIMES DAILY, Disp: 180 capsule, Rfl: 1   hydrALAZINE  (APRESOLINE ) 100 MG tablet, Take 1 tablet (100 mg total) by mouth 3 (three) times daily., Disp: 90 tablet, Rfl: 1   levothyroxine  (SYNTHROID ) 200 MCG tablet, Take 1 tablet (200 mcg total) by mouth daily before breakfast., Disp: 30 tablet, Rfl: 0   Multiple Vitamin (MULTIVITAMIN) tablet, Take 1 tablet by mouth daily., Disp: , Rfl:    mupirocin  ointment (BACTROBAN ) 2 %,  Apply to back of scalp where bumps are as needed for up to 5 days., Disp: 30 g, Rfl: 0   pantoprazole  (PROTONIX ) 40 MG tablet, Take 40 mg by mouth 2 (two) times daily., Disp: , Rfl:    valsartan -hydrochlorothiazide  (DIOVAN -HCT) 320-25 MG tablet, TAKE ONE TABLET BY MOUTH EVERY DAY, Disp: 90 tablet, Rfl: 1  Allergies:  Allergies  Allergen Reactions   Pregabalin Other (See Comments)    Elevated LFTs   Atorvastatin  Other (See Comments)    Myalgias.       Lipitor [Atorvastatin  Calcium ] Other (See Comments)    Myalgias.    Lisinopril Nausea Only   Merbromin Hives   Thimerosal (Thiomersal) Hives    Past  Medical History, Surgical history, Social history, and Family History were reviewed and updated.  Review of Systems: Review of Systems  Constitutional: Negative.   HENT:  Negative.    Eyes: Negative.   Respiratory: Negative.    Cardiovascular: Negative.   Gastrointestinal: Negative.   Endocrine: Negative.   Genitourinary: Negative.    Musculoskeletal:  Positive for back pain (chronic unchanged).  Skin: Negative.   Neurological: Negative.   Hematological: Negative.   Psychiatric/Behavioral: Negative.      Physical Exam:  height is 5' 8 (1.727 m) and weight is 188 lb 0.6 oz (85.3 kg). His oral temperature is 98.1 F (36.7 C). His blood pressure is 167/70 (abnormal) and his pulse is 87. His respiration is 20 and oxygen saturation is 95%.   Wt Readings from Last 3 Encounters:  01/16/23 188 lb 0.6 oz (85.3 kg)  01/12/23 192 lb (87.1 kg)  12/19/22 187 lb (84.8 kg)    Physical Exam Vitals reviewed.  Constitutional:      General: He is not in acute distress. HENT:     Head: Normocephalic and atraumatic.  Eyes:     Pupils: Pupils are equal, round, and reactive to light.  Cardiovascular:     Rate and Rhythm: Normal rate and regular rhythm.     Heart sounds: Normal heart sounds.  Pulmonary:     Effort: Pulmonary effort is normal. No respiratory distress.     Breath sounds: Normal breath sounds. No stridor. No wheezing, rhonchi or rales.  Abdominal:     General: Bowel sounds are normal.     Palpations: Abdomen is soft.  Musculoskeletal:        General: No tenderness or deformity. Normal range of motion.     Cervical back: Normal range of motion.  Lymphadenopathy:     Cervical: No cervical adenopathy.  Skin:    General: Skin is warm and dry.     Findings: No erythema or rash.  Neurological:     Mental Status: He is alert and oriented to person, place, and time.  Psychiatric:        Behavior: Behavior normal.        Thought Content: Thought content normal.         Judgment: Judgment normal.     Lab Results  Component Value Date   WBC 4.9 01/16/2023   HGB 9.8 (L) 01/16/2023   HCT 29.5 (L) 01/16/2023   MCV 85.8 01/16/2023   PLT 294 01/16/2023     Chemistry      Component Value Date/Time   NA 138 01/16/2023 0939   NA 133 (L) 11/24/2022 0928   K 3.7 01/16/2023 0939   CL 104 01/16/2023 0939   CO2 26 01/16/2023 0939   BUN 16 01/16/2023 0939   BUN  15 11/24/2022 0928   CREATININE 1.23 01/16/2023 0939   CREATININE 1.44 (H) 01/11/2022 0945   GLU 101 04/01/2013 0000      Component Value Date/Time   CALCIUM  9.2 01/16/2023 0939   ALKPHOS 85 01/16/2023 0939   AST 9 (L) 01/16/2023 0939   ALT 8 01/16/2023 0939   BILITOT 0.4 01/16/2023 0939      Impression and Plan: Walter Wilkerson is a very nice 73 year old white male with symptomatic anemia.  I suspect that this is coming multifactorial anemia.  Again has been getting IV iron .  I really believe he is going to need some ESA.  Thankfully, the bone marrow does not show anything that looks as if it is leukemic.  Again he may have some myelodysplasia.  We are checking the NGS studies to see if he does have any due to genetic abnormalities.  We will see about getting the ESA started this week.  I really believe that we can get his hemoglobin above 11.  If we can do this, then I think his cardiac issues will improve.    Maude JONELLE Crease, MD 1/6/20251:37 PM

## 2023-01-20 LAB — MISC LABCORP TEST (SEND OUT): Labcorp test code: 511060

## 2023-01-23 ENCOUNTER — Other Ambulatory Visit: Payer: Self-pay | Admitting: Physician Assistant

## 2023-01-23 DIAGNOSIS — I10 Essential (primary) hypertension: Secondary | ICD-10-CM

## 2023-02-03 ENCOUNTER — Encounter (HOSPITAL_BASED_OUTPATIENT_CLINIC_OR_DEPARTMENT_OTHER): Payer: Self-pay

## 2023-02-07 ENCOUNTER — Ambulatory Visit (HOSPITAL_BASED_OUTPATIENT_CLINIC_OR_DEPARTMENT_OTHER): Payer: Medicare Other | Admitting: Family

## 2023-02-07 DIAGNOSIS — L280 Lichen simplex chronicus: Secondary | ICD-10-CM | POA: Diagnosis not present

## 2023-02-08 ENCOUNTER — Encounter: Payer: Self-pay | Admitting: Hematology & Oncology

## 2023-02-08 ENCOUNTER — Inpatient Hospital Stay: Payer: Medicare Other | Admitting: Hematology & Oncology

## 2023-02-08 ENCOUNTER — Inpatient Hospital Stay: Payer: Medicare Other

## 2023-02-08 ENCOUNTER — Encounter: Payer: Self-pay | Admitting: *Deleted

## 2023-02-08 VITALS — BP 134/68 | HR 87 | Temp 97.8°F | Resp 20 | Ht 68.0 in | Wt 195.1 lb

## 2023-02-08 DIAGNOSIS — D500 Iron deficiency anemia secondary to blood loss (chronic): Secondary | ICD-10-CM

## 2023-02-08 DIAGNOSIS — D696 Thrombocytopenia, unspecified: Secondary | ICD-10-CM

## 2023-02-08 DIAGNOSIS — D509 Iron deficiency anemia, unspecified: Secondary | ICD-10-CM

## 2023-02-08 DIAGNOSIS — E611 Iron deficiency: Secondary | ICD-10-CM | POA: Diagnosis not present

## 2023-02-08 DIAGNOSIS — D631 Anemia in chronic kidney disease: Secondary | ICD-10-CM

## 2023-02-08 DIAGNOSIS — R918 Other nonspecific abnormal finding of lung field: Secondary | ICD-10-CM | POA: Diagnosis not present

## 2023-02-08 DIAGNOSIS — N189 Chronic kidney disease, unspecified: Secondary | ICD-10-CM | POA: Diagnosis not present

## 2023-02-08 LAB — CBC WITH DIFFERENTIAL (CANCER CENTER ONLY)
Abs Immature Granulocytes: 0.03 10*3/uL (ref 0.00–0.07)
Basophils Absolute: 0.1 10*3/uL (ref 0.0–0.1)
Basophils Relative: 1 %
Eosinophils Absolute: 0.2 10*3/uL (ref 0.0–0.5)
Eosinophils Relative: 3 %
HCT: 32.6 % — ABNORMAL LOW (ref 39.0–52.0)
Hemoglobin: 10.8 g/dL — ABNORMAL LOW (ref 13.0–17.0)
Immature Granulocytes: 0 %
Lymphocytes Relative: 12 %
Lymphs Abs: 0.9 10*3/uL (ref 0.7–4.0)
MCH: 28.8 pg (ref 26.0–34.0)
MCHC: 33.1 g/dL (ref 30.0–36.0)
MCV: 86.9 fL (ref 80.0–100.0)
Monocytes Absolute: 0.8 10*3/uL (ref 0.1–1.0)
Monocytes Relative: 10 %
Neutro Abs: 5.4 10*3/uL (ref 1.7–7.7)
Neutrophils Relative %: 74 %
Platelet Count: 389 10*3/uL (ref 150–400)
RBC: 3.75 MIL/uL — ABNORMAL LOW (ref 4.22–5.81)
RDW: 21 % — ABNORMAL HIGH (ref 11.5–15.5)
WBC Count: 7.3 10*3/uL (ref 4.0–10.5)
nRBC: 0 % (ref 0.0–0.2)

## 2023-02-08 LAB — SAVE SMEAR(SSMR), FOR PROVIDER SLIDE REVIEW

## 2023-02-08 LAB — CMP (CANCER CENTER ONLY)
ALT: 10 U/L (ref 0–44)
AST: 15 U/L (ref 15–41)
Albumin: 3.6 g/dL (ref 3.5–5.0)
Alkaline Phosphatase: 93 U/L (ref 38–126)
Anion gap: 8 (ref 5–15)
BUN: 23 mg/dL (ref 8–23)
CO2: 25 mmol/L (ref 22–32)
Calcium: 9.2 mg/dL (ref 8.9–10.3)
Chloride: 105 mmol/L (ref 98–111)
Creatinine: 1.29 mg/dL — ABNORMAL HIGH (ref 0.61–1.24)
GFR, Estimated: 59 mL/min — ABNORMAL LOW (ref >60)
Glucose, Bld: 112 mg/dL — ABNORMAL HIGH (ref 70–99)
Potassium: 4.3 mmol/L (ref 3.5–5.1)
Sodium: 138 mmol/L (ref 135–145)
Total Bilirubin: 0.3 mg/dL (ref 0.0–1.2)
Total Protein: 7.1 g/dL (ref 6.5–8.1)

## 2023-02-08 LAB — RETICULOCYTES
Immature Retic Fract: 10.6 % (ref 2.3–15.9)
RBC.: 3.78 MIL/uL — ABNORMAL LOW (ref 4.22–5.81)
Retic Count, Absolute: 43.1 10*3/uL (ref 19.0–186.0)
Retic Ct Pct: 1.1 % (ref 0.4–3.1)

## 2023-02-08 LAB — IRON AND IRON BINDING CAPACITY (CC-WL,HP ONLY)
Iron: 62 ug/dL (ref 45–182)
Saturation Ratios: 20 % (ref 17.9–39.5)
TIBC: 305 ug/dL (ref 250–450)
UIBC: 243 ug/dL (ref 117–376)

## 2023-02-08 LAB — FERRITIN: Ferritin: 94 ng/mL (ref 24–336)

## 2023-02-08 MED ORDER — DARBEPOETIN ALFA 300 MCG/0.6ML IJ SOSY
300.0000 ug | PREFILLED_SYRINGE | Freq: Once | INTRAMUSCULAR | Status: AC
Start: 2023-02-08 — End: 2023-02-08
  Administered 2023-02-08: 300 ug via SUBCUTANEOUS
  Filled 2023-02-08: qty 0.6

## 2023-02-08 NOTE — Progress Notes (Signed)
Hematology and Oncology Follow Up Visit  Walter Wilkerson [id number] 02/16/1950 73 y.o. 02/08/2023   Principle Diagnosis:  Multiple pulmonary nodules -history of tobacco use Anemia of iron deficiency  Anemia of erythropoietin deficiency  Current Therapy:    IV Iron -Venover 300 mg given 12/28/2022  Aranesp 300 mcg subcu for hemoglobin less than 11     Interim History:  Walter Wilkerson was seen for follow up.  His hemoglobin is improving.  The IV iron and the Aranesp are helping out quite a bit.  He feels little bit better.  He is having bad arthritis right now.  The cold weather is definitely affecting his arthritis.  He has had no issues with nausea or vomiting.  He has had no change in bowel or bladder habits.  Of note, when we last saw him, we did send off a NGS panel.  This is all normal.  There is no DNA abnormalities.  He has had no rashes.  He does have a little bit of a rash on his scalp.  This looks like some folliculitis.  There has been no problems with fever.  He has had no cough.  He has had an issue with pulmonary nodules in the past.  His last scan was done back in November.  Everything looked fine on the scan with nothing that looked troublesome for malignancy.  Overall, I would say that his performance status is probably ECOG 0.    Wt Readings from Last 3 Encounters:  02/08/23 195 lb 1.3 oz (88.5 kg)  01/16/23 188 lb 0.6 oz (85.3 kg)  01/12/23 192 lb (87.1 kg)    Medications:  Current Outpatient Medications:    albuterol (VENTOLIN HFA) 108 (90 Base) MCG/ACT inhaler, Inhale 2 puffs into the lungs every 4 (four) hours as needed. (Patient taking differently: Inhale 2 puffs into the lungs every 4 (four) hours as needed for wheezing or shortness of breath.), Disp: 6.7 g, Rfl: 0   aspirin EC 81 MG tablet, Take 81 mg by mouth daily., Disp: , Rfl:    atorvastatin (LIPITOR) 40 MG tablet, Take 1 tablet (40 mg total) by mouth daily., Disp: 90 tablet, Rfl: 3    clotrimazole-betamethasone (LOTRISONE) cream, Apply 1 Application topically 2 (two) times daily. X 10 days, Disp: 30 g, Rfl: 0   DULoxetine (CYMBALTA) 30 MG capsule, Take 1 capsule (30 mg total) by mouth daily., Disp: 90 capsule, Rfl: 1   ferrous sulfate 325 (65 FE) MG EC tablet, Take 1 tablet (325 mg total) by mouth 3 (three) times daily with meals., Disp: 90 tablet, Rfl: 11   Fluticasone-Umeclidin-Vilant (TRELEGY ELLIPTA) 100-62.5-25 MCG/ACT AEPB, Inhale 1 puff into the lungs daily in the afternoon., Disp: 60 each, Rfl: 5   gabapentin (NEURONTIN) 300 MG capsule, TAKE 1-2 CAPSULES BY MOUTH UP TO THREE TIMES DAILY, Disp: 180 capsule, Rfl: 1   hydrALAZINE (APRESOLINE) 100 MG tablet, Take 1 tablet (100 mg total) by mouth 3 (three) times daily., Disp: 90 tablet, Rfl: 1   levothyroxine (SYNTHROID) 200 MCG tablet, Take 1 tablet (200 mcg total) by mouth daily before breakfast., Disp: 30 tablet, Rfl: 0   Multiple Vitamin (MULTIVITAMIN) tablet, Take 1 tablet by mouth daily., Disp: , Rfl:    mupirocin ointment (BACTROBAN) 2 %, Apply to back of scalp where bumps are as needed for up to 5 days., Disp: 30 g, Rfl: 0   pantoprazole (PROTONIX) 40 MG tablet, Take 40 mg by mouth 2 (two) times daily., Disp: , Rfl:  valsartan-hydrochlorothiazide (DIOVAN-HCT) 320-25 MG tablet, TAKE ONE TABLET BY MOUTH EVERY DAY, Disp: 90 tablet, Rfl: 1  Allergies:  Allergies  Allergen Reactions   Pregabalin Other (See Comments)    Elevated LFTs   Atorvastatin Other (See Comments)    Myalgias.       Lipitor [Atorvastatin Calcium] Other (See Comments)    Myalgias.    Lisinopril Nausea Only   Merbromin Hives   Thimerosal (Thiomersal) Hives    Past Medical History, Surgical history, Social history, and Family History were reviewed and updated.  Review of Systems: Review of Systems  Constitutional: Negative.   HENT:  Negative.    Eyes: Negative.   Respiratory: Negative.    Cardiovascular: Negative.   Gastrointestinal:  Negative.   Endocrine: Negative.   Genitourinary: Negative.    Musculoskeletal:  Positive for back pain (chronic unchanged).  Skin: Negative.   Neurological: Negative.   Hematological: Negative.   Psychiatric/Behavioral: Negative.      Physical Exam:  height is 5\' 8"  (1.727 m) and weight is 195 lb 1.3 oz (88.5 kg). His oral temperature is 97.8 F (36.6 C). His blood pressure is 134/68 and his pulse is 87. His respiration is 20 and oxygen saturation is 99%.   Wt Readings from Last 3 Encounters:  02/08/23 195 lb 1.3 oz (88.5 kg)  01/16/23 188 lb 0.6 oz (85.3 kg)  01/12/23 192 lb (87.1 kg)    Physical Exam Vitals reviewed.  Constitutional:      General: He is not in acute distress. HENT:     Head: Normocephalic and atraumatic.  Eyes:     Pupils: Pupils are equal, round, and reactive to light.  Cardiovascular:     Rate and Rhythm: Normal rate and regular rhythm.     Heart sounds: Normal heart sounds.  Pulmonary:     Effort: Pulmonary effort is normal. No respiratory distress.     Breath sounds: Normal breath sounds. No stridor. No wheezing, rhonchi or rales.  Abdominal:     General: Bowel sounds are normal.     Palpations: Abdomen is soft.  Musculoskeletal:        General: No tenderness or deformity. Normal range of motion.     Cervical back: Normal range of motion.  Lymphadenopathy:     Cervical: No cervical adenopathy.  Skin:    General: Skin is warm and dry.     Findings: No erythema or rash.  Neurological:     Mental Status: He is alert and oriented to person, place, and time.  Psychiatric:        Behavior: Behavior normal.        Thought Content: Thought content normal.        Judgment: Judgment normal.     Lab Results  Component Value Date   WBC 7.3 02/08/2023   HGB 10.8 (L) 02/08/2023   HCT 32.6 (L) 02/08/2023   MCV 86.9 02/08/2023   PLT 389 02/08/2023     Chemistry      Component Value Date/Time   NA 138 02/08/2023 0952   NA 133 (L) 11/24/2022  0928   K 4.3 02/08/2023 0952   CL 105 02/08/2023 0952   CO2 25 02/08/2023 0952   BUN 23 02/08/2023 0952   BUN 15 11/24/2022 0928   CREATININE 1.29 (H) 02/08/2023 0952   CREATININE 1.44 (H) 01/11/2022 0945   GLU 101 04/01/2013 0000      Component Value Date/Time   CALCIUM 9.2 02/08/2023 0952   ALKPHOS 93  02/08/2023 0952   AST 15 02/08/2023 0952   ALT 10 02/08/2023 0952   BILITOT 0.3 02/08/2023 0952      Impression and Plan: Mr. Amison is a very nice 73 year old white male with symptomatic anemia.  I suspect that this is coming multifactorial anemia.  I have to believe that his iron levels will be okay.  MCV is coming up.  He is responding to the iron and the ESA.  I am glad that the NGS studies look okay.  So far, I will see anything on that NGS analysis that is troublesome.  I would like to get him back in the Spring now.  Hopefully, we try to move his appointments out a little bit longer.  At some point probably in the Summer, we may do another scan of his chest.     Josph Macho, MD 1/29/202511:21 AM

## 2023-02-08 NOTE — Patient Instructions (Signed)

## 2023-02-10 ENCOUNTER — Telehealth: Payer: Self-pay | Admitting: Hematology & Oncology

## 2023-02-10 NOTE — Telephone Encounter (Signed)
 Called to schedule IV Iron. LVM to return call for scheduling.

## 2023-02-15 ENCOUNTER — Inpatient Hospital Stay: Payer: Medicare Other | Attending: Hematology & Oncology

## 2023-02-15 VITALS — BP 153/77 | HR 70 | Temp 97.7°F | Resp 17

## 2023-02-15 DIAGNOSIS — D509 Iron deficiency anemia, unspecified: Secondary | ICD-10-CM | POA: Diagnosis not present

## 2023-02-15 DIAGNOSIS — N189 Chronic kidney disease, unspecified: Secondary | ICD-10-CM | POA: Insufficient documentation

## 2023-02-15 MED ORDER — SODIUM CHLORIDE 0.9 % IV SOLN
300.0000 mg | Freq: Once | INTRAVENOUS | Status: AC
  Administered 2023-02-15: 300 mg via INTRAVENOUS
  Filled 2023-02-15: qty 300

## 2023-02-15 MED ORDER — SODIUM CHLORIDE 0.9 % IV SOLN: INTRAVENOUS | Status: DC

## 2023-02-15 NOTE — Patient Instructions (Signed)

## 2023-02-15 NOTE — Progress Notes (Signed)
 Pt declined to stay for post infusion observation period. Pt stated he has tolerated medication multiple times prior without difficulty. Pt aware to call clinic with any questions or concerns. Pt verbalized understanding and had no further questions.

## 2023-03-17 ENCOUNTER — Other Ambulatory Visit: Payer: Self-pay | Admitting: Physician Assistant

## 2023-03-28 DIAGNOSIS — L570 Actinic keratosis: Secondary | ICD-10-CM | POA: Diagnosis not present

## 2023-03-28 DIAGNOSIS — L0100 Impetigo, unspecified: Secondary | ICD-10-CM | POA: Diagnosis not present

## 2023-03-29 ENCOUNTER — Inpatient Hospital Stay

## 2023-03-29 ENCOUNTER — Encounter: Payer: Self-pay | Admitting: Hematology & Oncology

## 2023-03-29 ENCOUNTER — Inpatient Hospital Stay: Attending: Hematology & Oncology

## 2023-03-29 ENCOUNTER — Ambulatory Visit (HOSPITAL_BASED_OUTPATIENT_CLINIC_OR_DEPARTMENT_OTHER)
Admission: RE | Admit: 2023-03-29 | Discharge: 2023-03-29 | Disposition: A | Discharge status: Home / Self Care | Source: Ambulatory Visit | Attending: Hematology & Oncology | Admitting: Hematology & Oncology

## 2023-03-29 ENCOUNTER — Inpatient Hospital Stay: Admitting: Hematology & Oncology

## 2023-03-29 ENCOUNTER — Telehealth: Payer: Self-pay | Admitting: *Deleted

## 2023-03-29 VITALS — BP 124/66 | HR 92 | Temp 97.6°F | Resp 18 | Ht 68.5 in | Wt 185.1 lb

## 2023-03-29 DIAGNOSIS — R918 Other nonspecific abnormal finding of lung field: Secondary | ICD-10-CM

## 2023-03-29 DIAGNOSIS — J189 Pneumonia, unspecified organism: Secondary | ICD-10-CM | POA: Diagnosis not present

## 2023-03-29 DIAGNOSIS — D509 Iron deficiency anemia, unspecified: Secondary | ICD-10-CM | POA: Diagnosis not present

## 2023-03-29 DIAGNOSIS — D500 Iron deficiency anemia secondary to blood loss (chronic): Secondary | ICD-10-CM

## 2023-03-29 DIAGNOSIS — Z87891 Personal history of nicotine dependence: Secondary | ICD-10-CM | POA: Diagnosis not present

## 2023-03-29 DIAGNOSIS — J90 Pleural effusion, not elsewhere classified: Secondary | ICD-10-CM | POA: Diagnosis not present

## 2023-03-29 DIAGNOSIS — R053 Chronic cough: Secondary | ICD-10-CM | POA: Diagnosis not present

## 2023-03-29 DIAGNOSIS — D631 Anemia in chronic kidney disease: Secondary | ICD-10-CM

## 2023-03-29 DIAGNOSIS — R042 Hemoptysis: Secondary | ICD-10-CM | POA: Diagnosis not present

## 2023-03-29 LAB — CMP (CANCER CENTER ONLY)
ALT: 8 U/L (ref 0–44)
AST: 13 U/L — ABNORMAL LOW (ref 15–41)
Albumin: 3.4 g/dL — ABNORMAL LOW (ref 3.5–5.0)
Alkaline Phosphatase: 94 U/L (ref 38–126)
Anion gap: 6 (ref 5–15)
BUN: 23 mg/dL (ref 8–23)
CO2: 24 mmol/L (ref 22–32)
Calcium: 9 mg/dL (ref 8.9–10.3)
Chloride: 106 mmol/L (ref 98–111)
Creatinine: 1.81 mg/dL — ABNORMAL HIGH (ref 0.61–1.24)
GFR, Estimated: 39 mL/min — ABNORMAL LOW (ref >60)
Glucose, Bld: 134 mg/dL — ABNORMAL HIGH (ref 70–99)
Potassium: 4 mmol/L (ref 3.5–5.1)
Sodium: 136 mmol/L (ref 135–145)
Total Bilirubin: 0.5 mg/dL (ref 0.0–1.2)
Total Protein: 7.6 g/dL (ref 6.5–8.1)

## 2023-03-29 LAB — CBC WITH DIFFERENTIAL (CANCER CENTER ONLY)
Abs Immature Granulocytes: 0.08 10*3/uL — ABNORMAL HIGH (ref 0.00–0.07)
Basophils Absolute: 0.1 10*3/uL (ref 0.0–0.1)
Basophils Relative: 1 %
Eosinophils Absolute: 0.1 10*3/uL (ref 0.0–0.5)
Eosinophils Relative: 2 %
HCT: 34.9 % — ABNORMAL LOW (ref 39.0–52.0)
Hemoglobin: 11.8 g/dL — ABNORMAL LOW (ref 13.0–17.0)
Immature Granulocytes: 1 %
Lymphocytes Relative: 13 %
Lymphs Abs: 0.7 10*3/uL (ref 0.7–4.0)
MCH: 28.9 pg (ref 26.0–34.0)
MCHC: 33.8 g/dL (ref 30.0–36.0)
MCV: 85.5 fL (ref 80.0–100.0)
Monocytes Absolute: 0.6 10*3/uL (ref 0.1–1.0)
Monocytes Relative: 11 %
Neutro Abs: 4 10*3/uL (ref 1.7–7.7)
Neutrophils Relative %: 72 %
Platelet Count: 281 10*3/uL (ref 150–400)
RBC: 4.08 MIL/uL — ABNORMAL LOW (ref 4.22–5.81)
RDW: 16.9 % — ABNORMAL HIGH (ref 11.5–15.5)
WBC Count: 5.6 10*3/uL (ref 4.0–10.5)
nRBC: 0 % (ref 0.0–0.2)

## 2023-03-29 LAB — RETICULOCYTES
Immature Retic Fract: 6.2 % (ref 2.3–15.9)
RBC.: 4.1 MIL/uL — ABNORMAL LOW (ref 4.22–5.81)
Retic Count, Absolute: 29.5 10*3/uL (ref 19.0–186.0)
Retic Ct Pct: 0.7 % (ref 0.4–3.1)

## 2023-03-29 LAB — IRON AND IRON BINDING CAPACITY (CC-WL,HP ONLY)
Iron: 86 ug/dL (ref 45–182)
Saturation Ratios: 34 % (ref 17.9–39.5)
TIBC: 255 ug/dL (ref 250–450)
UIBC: 169 ug/dL (ref 117–376)

## 2023-03-29 LAB — FERRITIN: Ferritin: 122 ng/mL (ref 24–336)

## 2023-03-29 MED ORDER — CEFDINIR 300 MG PO CAPS: 600.0000 mg | ORAL_CAPSULE | Freq: Every day | ORAL | 0 refills | Status: DC

## 2023-03-29 MED ORDER — METHYLPREDNISOLONE 4 MG PO TBPK: ORAL_TABLET | ORAL | 1 refills | Status: DC

## 2023-03-29 MED ORDER — BENZONATATE 200 MG PO CAPS: 200.0000 mg | ORAL_CAPSULE | Freq: Three times a day (TID) | ORAL | 1 refills | Status: DC | PRN

## 2023-03-29 NOTE — Telephone Encounter (Signed)
 Notified pt.

## 2023-03-29 NOTE — Progress Notes (Signed)
 Hematology and Oncology Follow Up Visit  Walter Wilkerson [id number] 10-03-1950 73 y.o. 03/29/2023   Principle Diagnosis:  Multiple pulmonary nodules -history of tobacco use Anemia of iron deficiency  Anemia of erythropoietin deficiency  Current Therapy:    IV Iron -Venover 300 mg given 12/28/2022  Aranesp 300 mcg subcu for hemoglobin less than 11     Interim History:  Walter Wilkerson was seen for follow up.  He clearly sounds a tough time right now.  He is having a lot of cough.  He is having some hemoptysis.  I did going get a chest x-ray on him.  The x-ray showed that there was likely pneumonia in the left upper lung.  He says he has been this way for couple weeks.  He does get short of breath.  He feels tired.  His blood count has improved nicely.  He has responded very nicely to iron and Aranesp.  He has had a little bit of a fever.  He has had no diarrhea.  There is been no problems with leg swelling.  He has had no rashes.  His last iron studies that were done today actually showed a ferritin of 122 with an iron saturation of 34%.  Currently, I would say his performance status is probably ECOG 2.    Wt Readings from Last 3 Encounters:  03/29/23 185 lb 0.8 oz (83.9 kg)  02/08/23 195 lb 1.3 oz (88.5 kg)  01/16/23 188 lb 0.6 oz (85.3 kg)    Medications:  Current Outpatient Medications:    albuterol (VENTOLIN HFA) 108 (90 Base) MCG/ACT inhaler, Inhale 2 puffs into the lungs every 4 (four) hours as needed. (Patient taking differently: Inhale 2 puffs into the lungs every 4 (four) hours as needed for wheezing or shortness of breath.), Disp: 6.7 g, Rfl: 0   aspirin EC 81 MG tablet, Take 81 mg by mouth daily., Disp: , Rfl:    atorvastatin (LIPITOR) 40 MG tablet, Take 1 tablet (40 mg total) by mouth daily., Disp: 90 tablet, Rfl: 3   clindamycin (CLEOCIN T) 1 % external solution, Apply topically 2 (two) times daily., Disp: , Rfl:    clobetasol cream (TEMOVATE) 0.05 %, Apply  topically 2 (two) times daily., Disp: , Rfl:    doxycycline (MONODOX) 100 MG capsule, Take 100 mg by mouth daily., Disp: , Rfl:    DULoxetine (CYMBALTA) 30 MG capsule, Take 1 capsule (30 mg total) by mouth daily., Disp: 90 capsule, Rfl: 1   ferrous sulfate 325 (65 FE) MG EC tablet, Take 1 tablet (325 mg total) by mouth 3 (three) times daily with meals., Disp: 90 tablet, Rfl: 11   Fluticasone-Umeclidin-Vilant (TRELEGY ELLIPTA) 100-62.5-25 MCG/ACT AEPB, Inhale 1 puff into the lungs daily in the afternoon., Disp: 60 each, Rfl: 5   gabapentin (NEURONTIN) 300 MG capsule, TAKE 1-2 CAPSULES BY MOUTH UP TO THREE TIMES DAILY, Disp: 180 capsule, Rfl: 1   hydrALAZINE (APRESOLINE) 100 MG tablet, Take 1 tablet (100 mg total) by mouth 3 (three) times daily., Disp: 90 tablet, Rfl: 1   levothyroxine (SYNTHROID) 200 MCG tablet, Take 1 tablet (200 mcg total) by mouth daily before breakfast., Disp: 30 tablet, Rfl: 0   Multiple Vitamin (MULTIVITAMIN) tablet, Take 1 tablet by mouth daily., Disp: , Rfl:    pantoprazole (PROTONIX) 40 MG tablet, TAKE ONE TABLET BY MOUTH TWICE DAILY, Disp: 180 tablet, Rfl: 1   valsartan-hydrochlorothiazide (DIOVAN-HCT) 320-25 MG tablet, TAKE ONE TABLET BY MOUTH EVERY DAY, Disp: 90 tablet, Rfl: 1  clotrimazole-betamethasone (LOTRISONE) cream, Apply 1 Application topically 2 (two) times daily. X 10 days, Disp: 30 g, Rfl: 0   mupirocin ointment (BACTROBAN) 2 %, Apply to back of scalp where bumps are as needed for up to 5 days., Disp: 30 g, Rfl: 0   predniSONE (DELTASONE) 1 MG tablet, Take 1 mg by mouth every morning., Disp: , Rfl:   Allergies:  Allergies  Allergen Reactions   Pregabalin Other (See Comments)    Elevated LFTs   Atorvastatin Other (See Comments)    Myalgias.       Lipitor [Atorvastatin Calcium] Other (See Comments)    Myalgias.    Lisinopril Nausea Only   Merbromin Hives   Thimerosal (Thiomersal) Hives    Past Medical History, Surgical history, Social history, and  Family History were reviewed and updated.  Review of Systems: Review of Systems  Constitutional: Negative.   HENT:  Negative.    Eyes: Negative.   Respiratory: Negative.    Cardiovascular: Negative.   Gastrointestinal: Negative.   Endocrine: Negative.   Genitourinary: Negative.    Musculoskeletal:  Positive for back pain (chronic unchanged).  Skin: Negative.   Neurological: Negative.   Hematological: Negative.   Psychiatric/Behavioral: Negative.      Physical Exam:  height is 5' 8.5" (1.74 m) and weight is 185 lb 0.8 oz (83.9 kg). His oral temperature is 97.6 F (36.4 C). His blood pressure is 124/66 and his pulse is 92. His respiration is 18 and oxygen saturation is 96%.   Wt Readings from Last 3 Encounters:  03/29/23 185 lb 0.8 oz (83.9 kg)  02/08/23 195 lb 1.3 oz (88.5 kg)  01/16/23 188 lb 0.6 oz (85.3 kg)    Physical Exam Vitals reviewed.  Constitutional:      General: He is not in acute distress. HENT:     Head: Normocephalic and atraumatic.  Eyes:     Pupils: Pupils are equal, round, and reactive to light.  Cardiovascular:     Rate and Rhythm: Normal rate and regular rhythm.     Heart sounds: Normal heart sounds.     Comments: Cardiac exam is regular rate and rhythm.  He has no murmurs, rubs or bruits. Pulmonary:     Effort: Pulmonary effort is normal. No respiratory distress.     Breath sounds: Normal breath sounds. No stridor. No wheezing, rhonchi or rales.     Comments: Pulmonary exam does show some slight wheezing bilaterally.  He has some rhonchi bilaterally.  I do not hear any obvious fluid. Abdominal:     General: Bowel sounds are normal.     Palpations: Abdomen is soft.  Musculoskeletal:        General: No tenderness or deformity. Normal range of motion.     Cervical back: Normal range of motion.  Lymphadenopathy:     Cervical: No cervical adenopathy.  Skin:    General: Skin is warm and dry.     Findings: No erythema or rash.  Neurological:      Mental Status: He is alert and oriented to person, place, and time.  Psychiatric:        Behavior: Behavior normal.        Thought Content: Thought content normal.        Judgment: Judgment normal.     Lab Results  Component Value Date   WBC 5.6 03/29/2023   HGB 11.8 (L) 03/29/2023   HCT 34.9 (L) 03/29/2023   MCV 85.5 03/29/2023   PLT 281 03/29/2023  Chemistry      Component Value Date/Time   NA 136 03/29/2023 1017   NA 133 (L) 11/24/2022 0928   K 4.0 03/29/2023 1017   CL 106 03/29/2023 1017   CO2 24 03/29/2023 1017   BUN 23 03/29/2023 1017   BUN 15 11/24/2022 0928   CREATININE 1.81 (H) 03/29/2023 1017   CREATININE 1.44 (H) 01/11/2022 0945   GLU 101 04/01/2013 0000      Component Value Date/Time   CALCIUM 9.0 03/29/2023 1017   ALKPHOS 94 03/29/2023 1017   AST 13 (L) 03/29/2023 1017   ALT 8 03/29/2023 1017   BILITOT 0.5 03/29/2023 1017      Impression and Plan: Walter Wilkerson is a very nice 73 year old white male with symptomatic anemia.  I suspect that this is multifactorial anemia.  The problem now is his lungs.  Again it looks that he may have pneumonia.  I will send in antibiotic for him.  Also will send in a cough medicine and probably some prednisone.  Hopefully this will help with his pneumonia.  He really needs to follow-up with his family doctor regarding this.  Hopefully, he will improve with the antibiotic and prednisone.  I would like to see him back in a month or so.     Josph Macho, MD 3/19/202511:21 AM

## 2023-03-29 NOTE — Telephone Encounter (Signed)
-----   Message from Josph Macho sent at 03/29/2023  2:40 PM EDT ----- Looks like you have pneumonia.  This appears in the left lung.  We really need to get her on some antibiotics.  I will call everything in.  Cindee Lame

## 2023-03-29 NOTE — Telephone Encounter (Signed)
-----   Message from Josph Macho sent at 03/29/2023  2:40 PM EDT ----- Please call and let him know that the iron levels are okay.  Cindee Lame

## 2023-03-29 NOTE — Telephone Encounter (Signed)
 As noted below by Dr. Myna Hidalgo, I informed the patient that it appears you have pneumonia in the left lung. Dr. Myna Hidalgo is going to call in some antibiotics and cough medicine. Patient stated,"my dermatologist just started me back on Doxycycline 100 mg po daily for four weeks. I have a rash and bumps on my scalp." I informed Dr. Myna Hidalgo, and he will call in additional antibiotics. Please send to [facility]. He verbalized understanding.

## 2023-04-06 LAB — HM DIABETES EYE EXAM

## 2023-04-08 ENCOUNTER — Other Ambulatory Visit: Payer: Self-pay | Admitting: Physician Assistant

## 2023-04-08 DIAGNOSIS — M130 Polyarthritis, unspecified: Secondary | ICD-10-CM

## 2023-04-17 ENCOUNTER — Ambulatory Visit: Payer: Medicare Other

## 2023-04-17 ENCOUNTER — Ambulatory Visit: Payer: Medicare Other | Admitting: Hematology & Oncology

## 2023-04-17 ENCOUNTER — Ambulatory Visit: Payer: Self-pay

## 2023-04-17 ENCOUNTER — Inpatient Hospital Stay: Payer: Medicare Other

## 2023-04-17 NOTE — Telephone Encounter (Signed)
 Patient scheduled tomorrow 04/18/23 with Tandy Gaw, PA

## 2023-04-17 NOTE — Telephone Encounter (Signed)
 Chief Complaint: Leg swelling  Symptoms: Bilateral leg swelling, Pain in the legs 5/10 Frequency: Constant onset x 2 weeks  Pertinent Negatives: Patient denies fever, chest pain, nausea, vomiting Disposition: [] ED /[] Urgent Care (no appt availability in office) / [x] Appointment(In office/virtual)/ []  [facility]/ [] Home Care/ [] Refused Recommended Disposition /[] [facility]/ []  Follow-up with PCP Additional Notes: Patient states he has bilateral leg swelling going into the feet as well that started about 2 weeks ago and is getting worse. Patient states the leg pain is a 5/10 and there is some redness on the legs but not much. Patient reports a history of lung cancer back in 2009. Patient has seen oncology recently and no cancer has been found. Care advice was given to patient and patient has been scheduled for evaluation tomorrow with PCP.   Copied from CRM [id number]. Topic: Clinical - Red Word Triage >> Apr 17, 2023  9:36 AM Hamdi H wrote: [facility] that prompted transfer to Nurse Triage: Pain and swelling with both legs and feet started two weeks ago. Reason for Disposition  [1] MODERATE leg swelling (e.g., swelling extends up to knees) AND [2] new-onset or worsening  Answer Assessment - Initial Assessment Questions 1. ONSET: "When did the swelling start?" (e.g., minutes, hours, days)     2 weeks ago  2. LOCATION: "What part of the leg is swollen?"  "Are both legs swollen or just one leg?"     Bilateral leg and feet  3. SEVERITY: "How bad is the swelling?" (e.g., localized; mild, moderate, severe)   - Localized: Small area of swelling localized to one leg.   - MILD pedal edema: Swelling limited to foot and ankle, pitting edema < 1/4 inch (6 mm) deep, rest and elevation eliminate most or all swelling.   - MODERATE edema: Swelling of lower leg to knee, pitting edema > 1/4 inch (6 mm) deep, rest and elevation only partially reduce swelling.   - SEVERE edema: Swelling extends  above knee, facial or hand swelling present.      Moderate  4. REDNESS: "Does the swelling look red or infected?"     No  5. PAIN: "Is the swelling painful to touch?" If Yes, ask: "How painful is it?"   (Scale 1-10; mild, moderate or severe)     5/10 6. FEVER: "Do you have a fever?" If Yes, ask: "What is it, how was it measured, and when did it start?"      No  7. CAUSE: "What do you think is causing the leg swelling?"     Unsure  8. MEDICAL HISTORY: "Do you have a history of blood clots (e.g., DVT), cancer, heart failure, kidney disease, or liver failure?"     Hx of lung Cancer 2009 9. RECURRENT SYMPTOM: "Have you had leg swelling before?" If Yes, ask: "When was the last time?" "What happened that time?"     Back in 2009 when I was diagnosed with Cancer  10. OTHER SYMPTOMS: "Do you have any other symptoms?" (e.g., chest pain, difficulty breathing)       Nothing new  11. PREGNANCY: "Is there any chance you are pregnant?" "When was your last menstrual period?"       N/A  Protocols used: Leg Swelling and Edema-A-AH

## 2023-04-18 ENCOUNTER — Ambulatory Visit (INDEPENDENT_AMBULATORY_CARE_PROVIDER_SITE_OTHER): Admitting: Physician Assistant

## 2023-04-18 ENCOUNTER — Encounter: Payer: Self-pay | Admitting: Physician Assistant

## 2023-04-18 VITALS — BP 160/85 | HR 78 | Ht 68.0 in | Wt 186.0 lb

## 2023-04-18 DIAGNOSIS — R600 Localized edema: Secondary | ICD-10-CM | POA: Diagnosis not present

## 2023-04-18 DIAGNOSIS — R0602 Shortness of breath: Secondary | ICD-10-CM

## 2023-04-18 DIAGNOSIS — I10 Essential (primary) hypertension: Secondary | ICD-10-CM

## 2023-04-18 MED ORDER — FUROSEMIDE 20 MG PO TABS: 20.0000 mg | ORAL_TABLET | Freq: Every day | ORAL | 1 refills | Status: DC

## 2023-04-18 MED ORDER — HYDRALAZINE HCL 100 MG PO TABS: 100.0000 mg | ORAL_TABLET | Freq: Two times a day (BID) | ORAL | Status: DC

## 2023-04-18 NOTE — Patient Instructions (Addendum)
 Will order echo Start lasix once daily for 1 week then as needed for swelling Compression sock  Chronic Venous Insufficiency Chronic venous insufficiency is a condition that causes the veins in the legs to struggle to pump blood from the legs to the heart. It is also called venous stasis. This condition can happen when the vein walls are stretched, weakened, or damaged. It can also happen when the valves inside the vein are damaged. With the right treatment, you should be able to still lead an active life. What are the causes? Common causes of this condition include: Venous hypertension. This is high blood pressure inside the veins. Sitting or standing too long. This can cause increased blood pressure in the veins of the leg. Deep vein thrombosis (DVT). This is a blood clot that blocks blood flow in a vein. Phlebitis. This is inflammation of a vein. It can cause a blood clot to form. An abnormal growth of cells (tumor) in the area between your hip bones (pelvis). This can cause blood to back up. What increases the risk? Factors that may make you more likely to get this condition include: Having a family history of the condition. Being overweight. Being pregnant. Not getting enough exercise. Smoking. Having a job that requires you to sit or stand in one place for a long time. Being a certain age. Females in their 10s and 24s and males in their 74s are more likely to get this condition. What are the signs or symptoms? Symptoms of this condition include: Varicose veins. These are veins that are enlarged, bulging, or twisted. Skin breakdown or ulcers. Reddened skin or dark discoloration of the skin on the leg between the knee and ankle. Lipodermatosclerosis. This is brown, smooth, tight, and painful skin just above the ankle. It is often on the inside of the leg. Swelling of the legs. How is this diagnosed? This condition may be diagnosed based on your medical history and a physical exam.  You may also need tests, such as: A duplex ultrasound. This shows how blood flows through a blood vessel. Plethysmography. This tests blood flow. Venogram. This looks at the veins using an X-ray and dye. How is this treated? The goals of treatment are to help you return to an active life and to relieve pain. Treatment may include: Wearing compression stockings. These do not cure the condition but can help relieve symptoms. They can also help stop your condition from getting worse. Sclerotherapy. This involves injecting a solution to shrink damaged veins. Surgery. This may include: Vein stripping. This is when a diseased vein is taken out. Laser ablation surgery. This is when blood flow is cut off through the vein. Repairing or remaking a valve inside the affected vein. Follow these instructions at home: Lifestyle Do not use any products that contain nicotine or tobacco. These products include cigarettes, chewing tobacco, and vaping devices, such as e-cigarettes. If you need help quitting, ask your health care provider. Stay active. Exercise, walk, or do other activities. Ask your provider what activities are safe for you. General instructions Take over-the-counter and prescription medicines only as told by your provider. Drink enough fluid to keep your pee (urine) pale yellow. Wear compression stockings as told by your provider. These stockings help to prevent blood clots and reduce swelling in your legs. Keep all follow-up visits. Your provider will check your legs for any changes and adjust your treatment plan as needed. Contact a health care provider if: You have redness, swelling, or more pain  in the affected area. You see a red streak or line that goes up or down from the area. You have skin breakdown or skin loss. You get an injury in the affected area. You get a fever. Get help right away if: You have severe pain that does not get better with medicine. You get an injury and an  open wound in the affected area. Your foot or ankle becomes numb or weak all of a sudden. You have trouble moving your foot or ankle. Your symptoms do not go away or get worse. You have chest pain. You have shortness of breath. These symptoms may be an emergency. Get help right away. Call 911. Do not wait to see if the symptoms will go away. Do not drive yourself to the hospital. This information is not intended to replace advice given to you by your health care provider. Make sure you discuss any questions you have with your health care provider. Document Revised: 01/11/2022 Document Reviewed: 01/11/2022 Elsevier Patient Education  2024 [employer].

## 2023-04-18 NOTE — Progress Notes (Unsigned)
 Established Patient Office Visit  Subjective   Patient ID: Walter Wilkerson, male    DOB: 11-08-1950  Age: 73 y.o. MRN: [id number]  Chief Complaint  Patient presents with   Medical Management of Chronic Issues    Bilateral leg swelling/ankle swelling and sob     HPI Pt is a 73 yo male who presents to the clinic with bilateral lower ankle and leg edema and shortness of breath. Pt has hx of OSA and on CPAP, lung cancer with right lung resected and multiple pulmonary nodules, Erythropoietin deficiency anemia.   He has noticed swelling for well over a month in legs but in his hands a long time. He denies any diet changes. He denies any medication changes. Pt is very compliant with medication. The swelling in his ankles and legs does get better over night and worsens when he gets up. He can lay flat at night. He is more short of breath doing day to day task. He does not exercise.   He has not seen pulmonary in a while. He has cardiologist. He has had stress test over a year ago.  .. Active Ambulatory Problems    Diagnosis Date Noted   Acquired hypothyroidism 08/11/2014   Essential hypertension 08/11/2014   Fatigue 11/04/2016   Epigastric pain 04/20/2017   Chronic pain syndrome 05/29/2018   Muscle cramps 09/03/2019   Lumbar spondylosis 12/03/2019   Right hand pain 12/03/2019   Primary osteoarthritis involving multiple joints 12/03/2019   Allergic sinusitis 06/03/2020   Polyarthritis 02/16/2021   Hand cramp 04/14/2021   Abnormal CT scan of lung 03/08/2022   Centrilobular emphysema (HCC) 03/08/2022   Intermittent chest pain 03/08/2022   Palpitations 03/08/2022   Newly recognized heart murmur 03/08/2022   Multiple lung nodules on CT 03/25/2022   Abnormal CT scan, lung 03/25/2022   History of lung cancer 03/25/2022   Thoracic aortic aneurysm 40 mm very of CT from 2024 04/20/2022   Neck pain on right side 06/14/2022   Macrocytic anemia 09/09/2022   Tonsillar mass 11/07/2022    Community acquired pneumonia of left upper lobe of lung 11/21/2022   Shortness of breath 11/21/2022   Elevated d-dimer 11/22/2022   Acute cough 11/22/2022   Pericardial effusion 11/24/2022   Thrombocytopenia with normocytic iron deficiency anemia 11/24/2022   Iron deficiency anemia 11/25/2022   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   Bilateral lower extremity edema 04/21/2023   SOB (shortness of breath) on exertion 04/21/2023   Resolved Ambulatory Problems    Diagnosis Date Noted   Hyperlipidemia 08/11/2014   Peripheral neuropathy (HCC) 08/11/2014   Non-small cell cancer of right lung (HCC) 08/11/2014   Acute bronchitis 01/08/2015   Shoulder impingement 02/24/2015   Hyperglycemia 04/14/2015   Prediabetes 04/15/2015   Dermatitis 10/18/2015   Cyst of pharynx 01/12/2016   Aortic atherosclerosis (HCC) 01/12/2016   Elevated serum creatinine 02/10/2016   Stress at work 11/04/2016   Adhesive capsulitis 12/27/2016   Nausea 04/20/2017   PUD (peptic ulcer disease) 04/20/2017   Metatarsal stress fracture of right foot 01/15/2018   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 03/02/2018   Injury of right knee 09/03/2019   Elevated liver enzymes 09/03/2019   Chronic pain of right knee 12/03/2019   NSAID induced gastritis 12/03/2019   Leg cramps 04/14/2021   EKG abnormalities 08/27/2021   Gastroesophageal reflux disease 08/27/2021   Thrombocytopenia (HCC) 01/17/2022   CAD (coronary artery disease) 03/25/2022   Aortic aneurysm, abdominal (HCC) 03/25/2022   Past  Medical History:  Diagnosis Date   Hypertension    Lung cancer (HCC)    Thyroid disease      ROS   See HPI.  Objective:     BP (!) 160/85   Pulse 78   Ht 5\' 8"  (1.727 m)   Wt 186 lb (84.4 kg)   SpO2 99%   BMI 28.28 kg/m  BP Readings from Last 3 Encounters:  04/18/23 (!) 160/85  03/29/23 124/66  02/15/23 (!) 153/77   Wt Readings from Last 3 Encounters:  04/19/23 186 lb (84.4 kg)  04/18/23 186 lb (84.4  kg)  03/29/23 185 lb 0.8 oz (83.9 kg)      Physical Exam Constitutional:      Appearance: Normal appearance.  HENT:     Head: Normocephalic.  Neck:     Vascular: No carotid bruit.  Cardiovascular:     Rate and Rhythm: Normal rate and regular rhythm.  Pulmonary:     Effort: Pulmonary effort is normal.  Musculoskeletal:     Right lower leg: Edema present.     Left lower leg: Edema present.     Comments: Bilateral 1+ pitting edema.   Neurological:     General: No focal deficit present.     Mental Status: He is alert and oriented to person, place, and time.  Psychiatric:        Mood and Affect: Mood normal.        Assessment & Plan:  .Yeshaya Vath" was seen today for medical management of chronic issues.  Diagnoses and all orders for this visit:  SOB (shortness of breath) on exertion -     furosemide (LASIX) 20 MG tablet; Take 1 tablet (20 mg total) by mouth daily. -     ECHOCARDIOGRAM COMPLETE; Future -     Cardiac Stress Test: Informed Consent Details: Physician/Practitioner Attestation; Transcribe to consent form and obtain patient signature  Essential hypertension -     hydrALAZINE (APRESOLINE) 100 MG tablet; Take 1 tablet (100 mg total) by mouth 2 (two) times daily.  Bilateral lower extremity edema -     furosemide (LASIX) 20 MG tablet; Take 1 tablet (20 mg total) by mouth daily. -     ECHOCARDIOGRAM COMPLETE; Future   Likely chronic venous insuffiency Start wearing compression stockings and keep feet elevated Lasix for next week then as needed Watch and avoid sodium Will order echo to evaluate for CHF BP not to goal Continue to monitor at home Follow up in 4 weeks  Tandy Gaw, PA-C

## 2023-04-19 ENCOUNTER — Ambulatory Visit (INDEPENDENT_AMBULATORY_CARE_PROVIDER_SITE_OTHER): Payer: Medicare Other

## 2023-04-19 VITALS — Ht 68.5 in | Wt 186.0 lb

## 2023-04-19 DIAGNOSIS — Z0000 Encounter for general adult medical examination without abnormal findings: Secondary | ICD-10-CM

## 2023-04-19 NOTE — Progress Notes (Signed)
 Subjective:   Walter Wilkerson is a 73 y.o. male who presents for Medicare Annual/Subsequent preventive examination.  Visit Complete: Virtual I connected with  [name] on 04/19/23 by a audio enabled telemedicine application and verified that I am speaking with the correct person using two identifiers.  Patient Location: Home  Provider Location: Office/Clinic  I discussed the limitations of evaluation and management by telemedicine. The patient expressed understanding and agreed to proceed.  Vital Signs: Because this visit was a virtual/telehealth visit, some criteria may be missing or patient reported. Any vitals not documented were not able to be obtained and vitals that have been documented are patient reported.  Patient Medicare AWV questionnaire was completed by the patient on n/a; I have confirmed that all information answered by patient is correct and no changes since this date.  Cardiac Risk Factors include: advanced age (>12men, >50 women);male gender;smoking/ tobacco exposure;sedentary lifestyle;family history of premature cardiovascular disease;hypertension     Objective:    Today's Vitals   04/19/23 0802 04/19/23 0803  Weight: 186 lb (84.4 kg)   Height: 5' 8.5" (1.74 m)   PainSc:  5    Body mass index is 27.87 kg/m.     04/19/2023    8:13 AM 03/29/2023   10:40 AM 02/08/2023   10:32 AM 01/16/2023   10:31 AM 12/19/2022   11:24 AM 12/12/2022   10:33 AM 12/09/2022    9:44 AM  Advanced Directives  Does Patient Have a Medical Advance Directive? Yes Yes Yes Yes Yes Yes Yes  Type of Advance Directive Living will Healthcare Power of [place];Living will Living will;Healthcare Power of [employer] Power of [place];Living will Healthcare Power of [place];Living will Healthcare Power of [place];Living will Healthcare Power of [place];Living will  Does patient want to make changes to medical advance directive? No - Patient declined No - Patient declined No -  Patient declined  No - Patient declined No - Patient declined No - Patient declined  Copy of Healthcare Power of Attorney in Chart?   No - copy requested No - copy requested No - copy requested No - copy requested No - copy requested    Current Medications (verified) Outpatient Encounter Medications as of 04/19/2023  Medication Sig   albuterol (VENTOLIN HFA) 108 (90 Base) MCG/ACT inhaler Inhale 2 puffs into the lungs every 4 (four) hours as needed. (Patient taking differently: Inhale 2 puffs into the lungs every 4 (four) hours as needed for wheezing or shortness of breath.)   aspirin EC 81 MG tablet Take 81 mg by mouth daily.   atorvastatin (LIPITOR) 40 MG tablet Take 1 tablet (40 mg total) by mouth daily.   DULoxetine (CYMBALTA) 30 MG capsule Take 1 capsule (30 mg total) by mouth daily.   Fluticasone-Umeclidin-Vilant (TRELEGY ELLIPTA) 100-62.5-25 MCG/ACT AEPB Inhale 1 puff into the lungs daily in the afternoon.   furosemide (LASIX) 20 MG tablet Take 1 tablet (20 mg total) by mouth daily.   gabapentin (NEURONTIN) 300 MG capsule TAKE 1-2 CAPSULES BY MOUTH UP TO THREE TIMES DAILY   hydrALAZINE (APRESOLINE) 100 MG tablet Take 1 tablet (100 mg total) by mouth 2 (two) times daily.   levothyroxine (SYNTHROID) 200 MCG tablet Take 1 tablet (200 mcg total) by mouth daily before breakfast.   Multiple Vitamin (MULTIVITAMIN) tablet Take 1 tablet by mouth daily.   pantoprazole (PROTONIX) 40 MG tablet TAKE ONE TABLET BY MOUTH TWICE DAILY   valsartan-hydrochlorothiazide (DIOVAN-HCT) 320-25 MG tablet TAKE ONE TABLET BY MOUTH EVERY DAY  clindamycin (CLEOCIN T) 1 % external solution Apply topically 2 (two) times daily. (Patient not taking: Reported on 04/19/2023)   clobetasol cream (TEMOVATE) 0.05 % Apply topically 2 (two) times daily. (Patient not taking: Reported on 04/19/2023)   clotrimazole-betamethasone (LOTRISONE) cream Apply 1 Application topically 2 (two) times daily. X 10 days (Patient not taking: Reported on  04/19/2023)   mupirocin ointment (BACTROBAN) 2 % Apply to back of scalp where bumps are as needed for up to 5 days. (Patient not taking: Reported on 04/19/2023)   predniSONE (DELTASONE) 1 MG tablet Take 1 mg by mouth every morning. (Patient not taking: Reported on 04/19/2023)   No facility-administered encounter medications on file as of 04/19/2023.    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 Jodi Geralds   VASECTOMY     Family History  Problem Relation Age of Onset   Heart attack Father    Hyperlipidemia Father    Hypertension Father    Heart disease Father    Diabetes Other        grandmother    Social History   Socioeconomic History   Marital status: Married    Spouse name: [name]   Number of children: 3   Years of education: 12   Highest education level: 12th grade  Occupational History   Occupation: [employer]    Comment: part time  Tobacco Use   Smoking status: Former    Current packs/day: 0.00    Average packs/day: 1 pack/day for 30.0 years (30.0 ttl pk-yrs)    Types: Cigarettes    Start date: 08/11/1974    Quit date: 08/10/2004    Years since quitting: 18.7   Smokeless tobacco: Never  Vaping Use   Vaping status: Never Used  Substance and Sexual Activity   Alcohol use: Yes    Alcohol/week: 21.0 standard drinks of alcohol    Types: 21 Shots of liquor per week    Comment: 1-2 shots every night   Drug use: No   Sexual activity: Not Currently    Partners: Female  Other Topics Concern   Not on file  Social History Narrative   Lives with his wife and son.  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   Social Drivers of Corporate investment banker Strain: Low Risk  (04/19/2023)   Overall Financial Resource Strain (CARDIA)    Difficulty of Paying Living Expenses: Not hard at all  Food Insecurity: No Food Insecurity (04/19/2023)   Hunger Vital Sign    Worried About Running Out of Food in the Last Year: Never true    Ran Out of Food in the Last Year: Never true  Transportation Needs: No Transportation Needs (04/19/2023)   PRAPARE - Administrator, Civil Service (Medical): No    Lack of Transportation (Non-Medical): No  Physical Activity: Insufficiently Active (04/19/2023)   Exercise Vital Sign    Days of Exercise per Week: 4 days    Minutes of Exercise per Session: 20 min  Stress: No Stress Concern Present (04/19/2023)   [employer] of Occupational Health -  Occupational Stress Questionnaire    Feeling of Stress : Not at all  Social Connections: Socially Integrated (04/19/2023)   Social Connection and Isolation Panel [NHANES]    Frequency of Communication with Friends and Family: More than three times a week    Frequency of Social Gatherings with Friends and Family: More than three times a week    Attends Religious Services: More than 4 times per year    Active Member of [employer] or Organizations: Yes    Attends Engineer, structural: More than 4 times per year    Marital Status: Married    Tobacco Counseling Counseling given: Not Answered   Clinical Intake:  Pre-visit preparation completed: Yes  Pain : 0-10 Pain Score: 5  Pain Type: Acute pain Pain Location: Foot Pain Orientation: Left, Right Pain Descriptors / Indicators: Aching, Burning, Constant Pain Onset: 1 to 4 weeks ago Pain Frequency: Constant     BMI - recorded: 27.81 Nutritional Status: BMI 25 -29 Overweight Nutritional Risks: None Diabetes: No  What is the last grade level you completed in school?: 12  Interpreter Needed?: No       Activities of Daily Living    04/19/2023    8:04 AM 12/09/2022    9:44 AM  In your present state of health, do you have any difficulty performing the following activities:  Hearing? 1 0  Vision? 0 0  Difficulty concentrating or making decisions? 0 0  Walking or climbing stairs? 1   Comment climbing stairs/COPD   Dressing or bathing? 0   Doing errands, shopping? 0   Preparing Food and eating ? N   Using the Toilet? N   In the past six months, have you accidently leaked urine? N   Do you have problems with loss of bowel control? N   Managing your Medications? N   Managing your Finances? N   Housekeeping or managing your Housekeeping? N     Patient Care Team: Nolene Ebbs as PCP - General (Family Medicine) Jodi Geralds, MD as Consulting Physician (Orthopedic Surgery) Josph Macho, MD as Medical Oncologist (Oncology)  Indicate any recent Medical Services you may have received from other than [facility] providers in the past year (date may be approximate).     Assessment:   This is a routine wellness examination for Walter Wilkerson.  Hearing/Vision screen No results found.   Goals Addressed             This Visit's Progress    DIET - INCREASE WATER INTAKE       He would like to increase his water intake and lose about 10 pounds.       Depression Screen    04/19/2023    8:12 AM 01/12/2023   11:59 AM 06/13/2022    8:49 AM 04/18/2022    8:11 AM 03/30/2022    1:08 PM 03/08/2022    8:49 AM 01/11/2022    8:29 AM  PHQ 2/9 Scores  PHQ - 2 Score 0 0 0 0 0 0 0    Fall Risk    04/19/2023    8:13 AM 01/12/2023   11:59 AM 10/17/2022    2:09 PM 04/18/2022    8:10 AM 03/08/2022    8:49 AM  Fall Risk   Falls in the past year? 0 0 0 0 0  Number falls in past yr: 0 0 0 0 0  Injury with Fall? 0 0 0 0 0  Risk for fall due to :  No Fall Risks No Fall Risks No Fall Risks No Fall Risks No Fall Risks  Follow up Falls evaluation completed Falls evaluation completed Falls evaluation  completed Falls evaluation completed Falls evaluation completed    MEDICARE RISK AT HOME: Medicare Risk at Home Any stairs in or around the home?: Yes If so, are there any without handrails?: Yes Home free of loose throw rugs in walkways, pet beds, electrical cords, etc?: Yes Adequate lighting in your home to reduce risk of falls?: Yes Life alert?: No Use of a cane, walker or w/c?: No Grab bars in the bathroom?: No Shower chair or bench in shower?: Yes Elevated toilet seat or a handicapped toilet?: No  TIMED UP AND GO:  Was the test performed?  No    Cognitive Function:        04/19/2023    8:14 AM 04/18/2022    8:14 AM 04/12/2021    8:10 AM 12/24/2018    8:11 AM 12/18/2017    8:14 AM  6CIT Screen  What Year? 0 points 0 points 0 points 0 points 0 points  What month? 0 points 0 points 0 points 0 points 0 points  What time? 0 points 0 points 0 points 0 points 0 points  Count back from 20 0 points 0 points 0 points 0 points 0 points  Months in reverse 0 points 0 points 0 points 0 points 0 points  Repeat phrase 0 points 0 points 0 points 0 points 0 points  Total Score 0 points 0 points 0 points 0 points 0 points    Immunizations Immunization History  Administered Date(s) Administered   Fluad Quad(high Dose 65+) 12/03/2019, 10/30/2020   Influenza Split 09/27/2010, 12/06/2011, 01/09/2013   Influenza, High Dose Seasonal PF 08/30/2022   Influenza,inj,Quad PF,6+ Mos 01/09/2013, 11/11/2014, 10/16/2015, 11/04/2016, 08/29/2018   Influenza-Unspecified 09/04/2017   Janssen (J&J) SARS-COV-2 Vaccination 03/24/2019, 02/02/2020   Novel Infuenza-h1n1-09 12/24/2007   Pneumococcal Conjugate-13 04/13/2015   Pneumococcal Polysaccharide-23 11/04/2016   Tdap 09/22/2010, 10/30/2020   Zoster Recombinant(Shingrix) 11/26/2018, 06/11/2019    TDAP status: Up to date  Flu Vaccine status: Up to date  Pneumococcal vaccine status: Up to date  Covid-19 vaccine status: Completed  vaccines  Qualifies for Shingles Vaccine? Yes   Zostavax completed No   Shingrix Completed?: Yes  Screening Tests Health Maintenance  Topic Date Due   COVID-19 Vaccine (3 - 2024-25 season) 09/11/2022   INFLUENZA VACCINE  08/11/2023   Medicare Annual Wellness (AWV)  04/18/2024   Colonoscopy  09/01/2025   DTaP/Tdap/Td (3 - Td or Tdap) 10/31/2030   Pneumonia Vaccine 9+ Years old  Completed   Hepatitis C Screening  Completed   Zoster Vaccines- Shingrix  Completed   HPV VACCINES  Aged Out    Health Maintenance  Health Maintenance Due  Topic Date Due   COVID-19 Vaccine (3 - 2024-25 season) 09/11/2022    Colorectal cancer screening: Type of screening: Colonoscopy. Completed 09/02/2022. Repeat every 3 years  Lung Cancer Screening: (Low Dose CT Chest recommended if Age 49-80 years, 20 pack-year currently smoking OR have quit w/in 15years.) does not qualify.   Lung Cancer Screening Referral: n/a  Additional Screening:  Hepatitis C Screening: does qualify; Completed 07/21/2017  Vision Screening: Recommended annual ophthalmology exams for early detection of glaucoma and other disorders of the eye. Is the patient up to date with their annual eye exam?  Yes  Who is the provider or what is the name of the office in which the patient  attends annual eye exams? Dr Clearance Coots If pt is not established with a provider, would they like to be referred to a provider to establish care?  N/a .   Dental Screening: Recommended annual dental exams for proper oral hygiene    Community Resource Referral / Chronic Care Management: CRR required this visit?  No   CCM required this visit?  No     Plan:     I have personally reviewed and noted the following in the patient's chart:   Medical and social history Use of alcohol, tobacco or illicit drugs  Current medications and supplements including opioid prescriptions. Patient is not currently taking opioid prescriptions. Functional ability and  status Nutritional status Physical activity Advanced directives List of other physicians Hospitalizations, surgeries, and ER visits in previous 12 months. Hospitalization #1 December 2 nd. Vitals Screenings to include cognitive, depression, and falls Referrals and appointments  In addition, I have reviewed and discussed with patient certain preventive protocols, quality metrics, and best practice recommendations. A written personalized care plan for preventive services as well as general preventive health recommendations were provided to patient.     Esmond Harps, CMA   04/19/2023   After Visit Summary: (MyChart) Due to this being a telephonic visit, the after visit summary with patients personalized plan was offered to patient via MyChart   Nurse Notes:   Walter Wilkerson is a 73 y.o. male patient of Jomarie Longs, PA-C who had a Medicare Annual Wellness Visit today via telephone. Walter Wilkerson is Working part time and lives with their spouse and son. He has 3 children. He reports that he is socially active and does interact with friends/family regularly. He is moderately physically active and enjoys working outside and doing yardwork.

## 2023-04-19 NOTE — Patient Instructions (Signed)
  [name] , Thank you for taking time to come for your Medicare Wellness Visit. I appreciate your ongoing commitment to your health goals. Please review the following plan we discussed and let me know if I can assist you in the future.   These are the goals we discussed:  Goals       DIET - INCREASE WATER INTAKE      He would like to increase his water intake and lose about 10 pounds.       Exercise 3x per week (30 min per time)      Continue to exercise as you are doing and drinking plenty of water daily. Your doing a great job      Patient Stated      Would like to be able to lift more weights at one time and continue exercising as he does. Doing a great job      Patient Stated (pt-stated)      Would like to loose 10 lbs.      Patient Stated (pt-stated)      04/18/2022 AWV Goal: Exercise for General Health  Patient will verbalize understanding of the benefits of increased physical activity: Exercising regularly is important. It will improve your overall fitness, flexibility, and endurance. Regular exercise also will improve your overall health. It can help you control your weight, reduce stress, and improve your bone density. Over the next year, patient will increase physical activity as tolerated with a goal of at least 150 minutes of moderate physical activity per week.  You can tell that you are exercising at a moderate intensity if your heart starts beating faster and you start breathing faster but can still hold a conversation. Moderate-intensity exercise ideas include: Walking 1 mile (1.6 km) in about 15 minutes Biking Hiking Golfing Dancing Water aerobics Patient will verbalize understanding of everyday activities that increase physical activity by providing examples like the following: Yard work, such as: Insurance underwriter Gardening Washing windows or floors Patient will be able to  explain general safety guidelines for exercising:  Before you start a new exercise program, talk with your health care provider. Do not exercise so much that you hurt yourself, feel dizzy, or get very short of breath. Wear comfortable clothes and wear shoes with good support. Drink plenty of water while you exercise to prevent dehydration or heat stroke. Work out until your breathing and your heartbeat get faster.         This is a list of the screening recommended for you and due dates:  Health Maintenance  Topic Date Due   COVID-19 Vaccine (3 - 2024-25 season) 09/11/2022   Flu Shot  08/11/2023   Medicare Annual Wellness Visit  04/18/2024   Colon Cancer Screening  09/01/2025   DTaP/Tdap/Td vaccine (3 - Td or Tdap) 10/31/2030   Pneumonia Vaccine  Completed   Hepatitis C Screening  Completed   Zoster (Shingles) Vaccine  Completed   HPV Vaccine  Aged Out

## 2023-04-21 DIAGNOSIS — R600 Localized edema: Secondary | ICD-10-CM

## 2023-04-21 DIAGNOSIS — R0602 Shortness of breath: Secondary | ICD-10-CM | POA: Insufficient documentation

## 2023-04-21 HISTORY — DX: Localized edema: R60.0

## 2023-04-24 ENCOUNTER — Other Ambulatory Visit: Payer: Self-pay | Admitting: Physician Assistant

## 2023-04-24 DIAGNOSIS — M130 Polyarthritis, unspecified: Secondary | ICD-10-CM

## 2023-04-25 DIAGNOSIS — L814 Other melanin hyperpigmentation: Secondary | ICD-10-CM | POA: Diagnosis not present

## 2023-04-25 DIAGNOSIS — L578 Other skin changes due to chronic exposure to nonionizing radiation: Secondary | ICD-10-CM | POA: Diagnosis not present

## 2023-04-25 DIAGNOSIS — N2889 Other specified disorders of kidney and ureter: Secondary | ICD-10-CM | POA: Diagnosis not present

## 2023-04-26 NOTE — Telephone Encounter (Signed)
 2nd request-   Is refill appropriate for the patient? Pls advise, thanks.

## 2023-04-27 ENCOUNTER — Inpatient Hospital Stay

## 2023-04-27 ENCOUNTER — Observation Stay (HOSPITAL_COMMUNITY)

## 2023-04-27 ENCOUNTER — Inpatient Hospital Stay: Admitting: Medical Oncology

## 2023-04-27 ENCOUNTER — Ambulatory Visit (HOSPITAL_BASED_OUTPATIENT_CLINIC_OR_DEPARTMENT_OTHER)
Admission: RE | Admit: 2023-04-27 | Discharge: 2023-04-27 | Disposition: A | Discharge status: Home / Self Care | Source: Ambulatory Visit | Attending: Medical Oncology | Admitting: Medical Oncology

## 2023-04-27 ENCOUNTER — Encounter (HOSPITAL_COMMUNITY): Payer: Self-pay | Admitting: *Deleted

## 2023-04-27 ENCOUNTER — Other Ambulatory Visit: Payer: Self-pay

## 2023-04-27 ENCOUNTER — Encounter: Payer: Self-pay | Admitting: Medical Oncology

## 2023-04-27 ENCOUNTER — Inpatient Hospital Stay (HOSPITAL_COMMUNITY)
Admission: EM | Admit: 2023-04-27 | Discharge: 2023-05-11 | DRG: 682 | Disposition: A | Discharge status: Home / Self Care | Source: Ambulatory Visit | Attending: Family Medicine | Admitting: Family Medicine

## 2023-04-27 ENCOUNTER — Telehealth: Payer: Self-pay

## 2023-04-27 VITALS — BP 136/69 | HR 76 | Temp 98.0°F | Resp 18 | Ht 68.0 in | Wt 180.0 lb

## 2023-04-27 DIAGNOSIS — N019 Rapidly progressive nephritic syndrome with unspecified morphologic changes: Secondary | ICD-10-CM | POA: Diagnosis not present

## 2023-04-27 DIAGNOSIS — I10 Essential (primary) hypertension: Secondary | ICD-10-CM | POA: Diagnosis not present

## 2023-04-27 DIAGNOSIS — Z902 Acquired absence of lung [part of]: Secondary | ICD-10-CM

## 2023-04-27 DIAGNOSIS — J439 Emphysema, unspecified: Secondary | ICD-10-CM | POA: Diagnosis present

## 2023-04-27 DIAGNOSIS — Z7951 Long term (current) use of inhaled steroids: Secondary | ICD-10-CM

## 2023-04-27 DIAGNOSIS — N179 Acute kidney failure, unspecified: Secondary | ICD-10-CM | POA: Diagnosis not present

## 2023-04-27 DIAGNOSIS — Z96651 Presence of right artificial knee joint: Secondary | ICD-10-CM | POA: Diagnosis present

## 2023-04-27 DIAGNOSIS — I129 Hypertensive chronic kidney disease with stage 1 through stage 4 chronic kidney disease, or unspecified chronic kidney disease: Secondary | ICD-10-CM | POA: Diagnosis present

## 2023-04-27 DIAGNOSIS — N189 Chronic kidney disease, unspecified: Secondary | ICD-10-CM | POA: Insufficient documentation

## 2023-04-27 DIAGNOSIS — E872 Acidosis, unspecified: Secondary | ICD-10-CM | POA: Diagnosis present

## 2023-04-27 DIAGNOSIS — M47816 Spondylosis without myelopathy or radiculopathy, lumbar region: Secondary | ICD-10-CM

## 2023-04-27 DIAGNOSIS — R739 Hyperglycemia, unspecified: Secondary | ICD-10-CM | POA: Diagnosis not present

## 2023-04-27 DIAGNOSIS — Z9889 Other specified postprocedural states: Secondary | ICD-10-CM

## 2023-04-27 DIAGNOSIS — E860 Dehydration: Secondary | ICD-10-CM | POA: Diagnosis present

## 2023-04-27 DIAGNOSIS — Z833 Family history of diabetes mellitus: Secondary | ICD-10-CM

## 2023-04-27 DIAGNOSIS — D631 Anemia in chronic kidney disease: Secondary | ICD-10-CM | POA: Diagnosis not present

## 2023-04-27 DIAGNOSIS — R918 Other nonspecific abnormal finding of lung field: Secondary | ICD-10-CM | POA: Diagnosis present

## 2023-04-27 DIAGNOSIS — Z87891 Personal history of nicotine dependence: Secondary | ICD-10-CM | POA: Insufficient documentation

## 2023-04-27 DIAGNOSIS — J9601 Acute respiratory failure with hypoxia: Secondary | ICD-10-CM | POA: Diagnosis not present

## 2023-04-27 DIAGNOSIS — E039 Hypothyroidism, unspecified: Secondary | ICD-10-CM | POA: Diagnosis present

## 2023-04-27 DIAGNOSIS — G4733 Obstructive sleep apnea (adult) (pediatric): Secondary | ICD-10-CM | POA: Diagnosis not present

## 2023-04-27 DIAGNOSIS — M545 Low back pain, unspecified: Secondary | ICD-10-CM | POA: Diagnosis present

## 2023-04-27 DIAGNOSIS — Z6828 Body mass index (BMI) 28.0-28.9, adult: Secondary | ICD-10-CM

## 2023-04-27 DIAGNOSIS — T380X5A Adverse effect of glucocorticoids and synthetic analogues, initial encounter: Secondary | ICD-10-CM | POA: Diagnosis not present

## 2023-04-27 DIAGNOSIS — R0789 Other chest pain: Secondary | ICD-10-CM | POA: Diagnosis not present

## 2023-04-27 DIAGNOSIS — E871 Hypo-osmolality and hyponatremia: Secondary | ICD-10-CM | POA: Diagnosis not present

## 2023-04-27 DIAGNOSIS — N281 Cyst of kidney, acquired: Secondary | ICD-10-CM | POA: Diagnosis present

## 2023-04-27 DIAGNOSIS — I872 Venous insufficiency (chronic) (peripheral): Secondary | ICD-10-CM | POA: Diagnosis present

## 2023-04-27 DIAGNOSIS — Z8249 Family history of ischemic heart disease and other diseases of the circulatory system: Secondary | ICD-10-CM

## 2023-04-27 DIAGNOSIS — N1831 Chronic kidney disease, stage 3a: Secondary | ICD-10-CM | POA: Diagnosis present

## 2023-04-27 DIAGNOSIS — D509 Iron deficiency anemia, unspecified: Secondary | ICD-10-CM | POA: Insufficient documentation

## 2023-04-27 DIAGNOSIS — Z85118 Personal history of other malignant neoplasm of bronchus and lung: Secondary | ICD-10-CM

## 2023-04-27 DIAGNOSIS — Z888 Allergy status to other drugs, medicaments and biological substances status: Secondary | ICD-10-CM

## 2023-04-27 DIAGNOSIS — R7989 Other specified abnormal findings of blood chemistry: Secondary | ICD-10-CM | POA: Diagnosis not present

## 2023-04-27 DIAGNOSIS — R011 Cardiac murmur, unspecified: Secondary | ICD-10-CM | POA: Diagnosis not present

## 2023-04-27 DIAGNOSIS — Z83438 Family history of other disorder of lipoprotein metabolism and other lipidemia: Secondary | ICD-10-CM

## 2023-04-27 DIAGNOSIS — Z7982 Long term (current) use of aspirin: Secondary | ICD-10-CM

## 2023-04-27 DIAGNOSIS — Z7952 Long term (current) use of systemic steroids: Secondary | ICD-10-CM

## 2023-04-27 DIAGNOSIS — R768 Other specified abnormal immunological findings in serum: Secondary | ICD-10-CM | POA: Diagnosis not present

## 2023-04-27 DIAGNOSIS — I5031 Acute diastolic (congestive) heart failure: Secondary | ICD-10-CM | POA: Diagnosis not present

## 2023-04-27 DIAGNOSIS — E559 Vitamin D deficiency, unspecified: Secondary | ICD-10-CM | POA: Diagnosis present

## 2023-04-27 DIAGNOSIS — G894 Chronic pain syndrome: Secondary | ICD-10-CM | POA: Diagnosis not present

## 2023-04-27 DIAGNOSIS — Z79899 Other long term (current) drug therapy: Secondary | ICD-10-CM

## 2023-04-27 DIAGNOSIS — Z8701 Personal history of pneumonia (recurrent): Secondary | ICD-10-CM

## 2023-04-27 DIAGNOSIS — Z79890 Hormone replacement therapy (postmenopausal): Secondary | ICD-10-CM

## 2023-04-27 DIAGNOSIS — K219 Gastro-esophageal reflux disease without esophagitis: Secondary | ICD-10-CM | POA: Diagnosis not present

## 2023-04-27 DIAGNOSIS — E669 Obesity, unspecified: Secondary | ICD-10-CM | POA: Diagnosis present

## 2023-04-27 DIAGNOSIS — N170 Acute kidney failure with tubular necrosis: Principal | ICD-10-CM | POA: Diagnosis present

## 2023-04-27 HISTORY — DX: Acute kidney failure, unspecified: N17.9

## 2023-04-27 LAB — CBC WITH DIFFERENTIAL/PLATELET
Abs Immature Granulocytes: 0.02 10*3/uL (ref 0.00–0.07)
Basophils Absolute: 0.1 10*3/uL (ref 0.0–0.1)
Basophils Relative: 1 %
Eosinophils Absolute: 0.1 10*3/uL (ref 0.0–0.5)
Eosinophils Relative: 3 %
HCT: 31.3 % — ABNORMAL LOW (ref 39.0–52.0)
Hemoglobin: 10.3 g/dL — ABNORMAL LOW (ref 13.0–17.0)
Immature Granulocytes: 1 %
Lymphocytes Relative: 17 %
Lymphs Abs: 0.7 10*3/uL (ref 0.7–4.0)
MCH: 28.9 pg (ref 26.0–34.0)
MCHC: 32.9 g/dL (ref 30.0–36.0)
MCV: 87.9 fL (ref 80.0–100.0)
Monocytes Absolute: 0.3 10*3/uL (ref 0.1–1.0)
Monocytes Relative: 7 %
Neutro Abs: 2.9 10*3/uL (ref 1.7–7.7)
Neutrophils Relative %: 71 %
Platelets: 266 10*3/uL (ref 150–400)
RBC: 3.56 MIL/uL — ABNORMAL LOW (ref 4.22–5.81)
RDW: 17.9 % — ABNORMAL HIGH (ref 11.5–15.5)
WBC: 4.2 10*3/uL (ref 4.0–10.5)
nRBC: 0 % (ref 0.0–0.2)

## 2023-04-27 LAB — CMP (CANCER CENTER ONLY)
ALT: 7 U/L (ref 0–44)
AST: 12 U/L — ABNORMAL LOW (ref 15–41)
Albumin: 3.4 g/dL — ABNORMAL LOW (ref 3.5–5.0)
Alkaline Phosphatase: 69 U/L (ref 38–126)
Anion gap: 11 (ref 5–15)
BUN: 58 mg/dL — ABNORMAL HIGH (ref 8–23)
CO2: 24 mmol/L (ref 22–32)
Calcium: 8.8 mg/dL — ABNORMAL LOW (ref 8.9–10.3)
Chloride: 102 mmol/L (ref 98–111)
Creatinine: 5.1 mg/dL — ABNORMAL HIGH (ref 0.61–1.24)
GFR, Estimated: 11 mL/min — ABNORMAL LOW (ref >60)
Glucose, Bld: 108 mg/dL — ABNORMAL HIGH (ref 70–99)
Potassium: 3.9 mmol/L (ref 3.5–5.1)
Sodium: 137 mmol/L (ref 135–145)
Total Bilirubin: 0.6 mg/dL (ref 0.0–1.2)
Total Protein: 7 g/dL (ref 6.5–8.1)

## 2023-04-27 LAB — LACTATE DEHYDROGENASE: LDH: 165 U/L (ref 98–192)

## 2023-04-27 LAB — COMPREHENSIVE METABOLIC PANEL WITH GFR
ALT: 10 U/L (ref 0–44)
AST: 15 U/L (ref 15–41)
Albumin: 3.2 g/dL — ABNORMAL LOW (ref 3.5–5.0)
Alkaline Phosphatase: 66 U/L (ref 38–126)
Anion gap: 9 (ref 5–15)
BUN: 59 mg/dL — ABNORMAL HIGH (ref 8–23)
CO2: 23 mmol/L (ref 22–32)
Calcium: 8.4 mg/dL — ABNORMAL LOW (ref 8.9–10.3)
Chloride: 102 mmol/L (ref 98–111)
Creatinine, Ser: 4.8 mg/dL — ABNORMAL HIGH (ref 0.61–1.24)
GFR, Estimated: 12 mL/min — ABNORMAL LOW (ref >60)
Glucose, Bld: 106 mg/dL — ABNORMAL HIGH (ref 70–99)
Potassium: 3.9 mmol/L (ref 3.5–5.1)
Sodium: 134 mmol/L — ABNORMAL LOW (ref 135–145)
Total Bilirubin: 0.7 mg/dL (ref 0.0–1.2)
Total Protein: 7.4 g/dL (ref 6.5–8.1)

## 2023-04-27 LAB — RETICULOCYTES
Immature Retic Fract: 7.1 % (ref 2.3–15.9)
RBC.: 3.58 MIL/uL — ABNORMAL LOW (ref 4.22–5.81)
Retic Count, Absolute: 31.9 10*3/uL (ref 19.0–186.0)
Retic Ct Pct: 0.9 % (ref 0.4–3.1)

## 2023-04-27 LAB — CBC WITH DIFFERENTIAL (CANCER CENTER ONLY)
Abs Immature Granulocytes: 0.01 10*3/uL (ref 0.00–0.07)
Basophils Absolute: 0.1 10*3/uL (ref 0.0–0.1)
Basophils Relative: 1 %
Eosinophils Absolute: 0.1 10*3/uL (ref 0.0–0.5)
Eosinophils Relative: 4 %
HCT: 31.3 % — ABNORMAL LOW (ref 39.0–52.0)
Hemoglobin: 10.3 g/dL — ABNORMAL LOW (ref 13.0–17.0)
Immature Granulocytes: 0 %
Lymphocytes Relative: 21 %
Lymphs Abs: 0.7 10*3/uL (ref 0.7–4.0)
MCH: 28.5 pg (ref 26.0–34.0)
MCHC: 32.9 g/dL (ref 30.0–36.0)
MCV: 86.5 fL (ref 80.0–100.0)
Monocytes Absolute: 0.4 10*3/uL (ref 0.1–1.0)
Monocytes Relative: 10 %
Neutro Abs: 2.3 10*3/uL (ref 1.7–7.7)
Neutrophils Relative %: 64 %
Platelet Count: 268 10*3/uL (ref 150–400)
RBC: 3.62 MIL/uL — ABNORMAL LOW (ref 4.22–5.81)
RDW: 17.8 % — ABNORMAL HIGH (ref 11.5–15.5)
WBC Count: 3.6 10*3/uL — ABNORMAL LOW (ref 4.0–10.5)
nRBC: 0 % (ref 0.0–0.2)

## 2023-04-27 LAB — URINALYSIS, W/ REFLEX TO CULTURE (INFECTION SUSPECTED)
Bacteria, UA: NONE SEEN
Bilirubin Urine: NEGATIVE
Glucose, UA: NEGATIVE mg/dL
Ketones, ur: NEGATIVE mg/dL
Leukocytes,Ua: NEGATIVE
Nitrite: NEGATIVE
Protein, ur: NEGATIVE mg/dL
RBC / HPF: >50 RBC/hpf (ref 0–5)
Specific Gravity, Urine: 1.008 (ref 1.005–1.030)
pH: 6 (ref 5.0–8.0)

## 2023-04-27 LAB — NA AND K (SODIUM & POTASSIUM), RAND UR
Potassium Urine: 18 mmol/L
Sodium, Ur: 109 mmol/L

## 2023-04-27 LAB — IRON AND IRON BINDING CAPACITY (CC-WL,HP ONLY)
Iron: 51 ug/dL (ref 45–182)
Saturation Ratios: 21 % (ref 17.9–39.5)
TIBC: 239 ug/dL — ABNORMAL LOW (ref 250–450)
UIBC: 188 ug/dL (ref 117–376)

## 2023-04-27 LAB — FERRITIN: Ferritin: 154 ng/mL (ref 24–336)

## 2023-04-27 MED ORDER — LACTATED RINGERS IV SOLN: INTRAVENOUS | Status: DC

## 2023-04-27 MED ORDER — LEVOTHYROXINE SODIUM 25 MCG PO TABS
175.0000 ug | ORAL_TABLET | Freq: Every day | ORAL | Status: DC
  Administered 2023-04-28 – 2023-05-11 (×13): 175 ug via ORAL
  Filled 2023-04-27 (×3): qty 1
  Filled 2023-04-27 (×4): qty 2
  Filled 2023-04-27 (×2): qty 1
  Filled 2023-04-27: qty 2
  Filled 2023-04-27: qty 1
  Filled 2023-04-27 (×2): qty 2
  Filled 2023-04-27 (×2): qty 1
  Filled 2023-04-27 (×3): qty 2
  Filled 2023-04-27 (×2): qty 1
  Filled 2023-04-27: qty 2
  Filled 2023-04-27 (×3): qty 1

## 2023-04-27 MED ORDER — LACTATED RINGERS IV BOLUS
1000.0000 mL | Freq: Once | INTRAVENOUS | Status: AC
  Administered 2023-04-27: 1000 mL via INTRAVENOUS

## 2023-04-27 MED ORDER — PANTOPRAZOLE SODIUM 40 MG PO TBEC
40.0000 mg | DELAYED_RELEASE_TABLET | Freq: Every day | ORAL | Status: DC
  Administered 2023-04-27 – 2023-05-11 (×15): 40 mg via ORAL
  Filled 2023-04-27 (×16): qty 1

## 2023-04-27 MED ORDER — ATORVASTATIN CALCIUM 40 MG PO TABS
40.0000 mg | ORAL_TABLET | Freq: Every day | ORAL | Status: DC
  Administered 2023-04-28 – 2023-05-11 (×14): 40 mg via ORAL
  Filled 2023-04-27 (×14): qty 1

## 2023-04-27 MED ORDER — ACETAMINOPHEN 325 MG PO TABS
650.0000 mg | ORAL_TABLET | Freq: Four times a day (QID) | ORAL | Status: DC | PRN
  Administered 2023-05-01: 650 mg via ORAL
  Filled 2023-04-27: qty 2

## 2023-04-27 MED ORDER — LEVOTHYROXINE SODIUM 100 MCG PO TABS: 200.0000 ug | ORAL_TABLET | Freq: Every day | ORAL | Status: DC

## 2023-04-27 MED ORDER — HEPARIN SODIUM (PORCINE) 5000 UNIT/ML IJ SOLN
5000.0000 [IU] | Freq: Three times a day (TID) | INTRAMUSCULAR | Status: DC
  Administered 2023-04-27 – 2023-05-02 (×14): 5000 [IU] via SUBCUTANEOUS
  Filled 2023-04-27 (×14): qty 1

## 2023-04-27 MED ORDER — DULOXETINE HCL 30 MG PO CPEP
30.0000 mg | ORAL_CAPSULE | Freq: Every day | ORAL | Status: DC
  Administered 2023-04-28 – 2023-05-11 (×14): 30 mg via ORAL
  Filled 2023-04-27 (×14): qty 1

## 2023-04-27 MED ORDER — LACTATED RINGERS IV BOLUS
500.0000 mL | Freq: Once | INTRAVENOUS | Status: AC
  Administered 2023-04-27: 500 mL via INTRAVENOUS

## 2023-04-27 MED ORDER — BUDESON-GLYCOPYRROL-FORMOTEROL 160-9-4.8 MCG/ACT IN AERO
2.0000 | INHALATION_SPRAY | Freq: Two times a day (BID) | RESPIRATORY_TRACT | Status: DC
  Administered 2023-04-27 – 2023-05-11 (×28): 2 via RESPIRATORY_TRACT
  Filled 2023-04-27: qty 5.9

## 2023-04-27 MED ORDER — ACETAMINOPHEN 650 MG RE SUPP: 650.0000 mg | Freq: Four times a day (QID) | RECTAL | Status: DC | PRN

## 2023-04-27 MED ORDER — HYDRALAZINE HCL 25 MG PO TABS
100.0000 mg | ORAL_TABLET | Freq: Two times a day (BID) | ORAL | Status: DC
  Administered 2023-04-27 – 2023-05-11 (×28): 100 mg via ORAL
  Filled 2023-04-27 (×29): qty 4

## 2023-04-27 NOTE — ED Notes (Signed)
 Patient is resting comfortably.

## 2023-04-27 NOTE — Telephone Encounter (Signed)
 As recommended, patient is currently in the ER. Patient will keep us  updated as needed.

## 2023-04-27 NOTE — Hospital Course (Addendum)
 Walter Wilkerson is a 73 y.o. male with a history of lung cancer s/p resection in remission, CKD stage IIIa, primary hypertension, hypothyroidism, emphysema, iron  deficiency anemia, chronic pain syndrome.  Patient presented secondary to abnormal outpatient labs with evidence of acute kidney injury.  Nephrology consulted for comanagement. 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

## 2023-04-27 NOTE — ED Triage Notes (Signed)
 [facility] sent from [facility] due to abnormal labs, GFR 11 4 weeks ago 39. Concerned about issue

## 2023-04-27 NOTE — Telephone Encounter (Signed)
 [facility] Center called stating Walter Wilkerson's creatinine went from 1.81 mg/dl to 4.09 mg/dl in 1 month. The states he has some edema in his legs and feet. He told them he didn't want to go to the ED. I advised he needs to go to be evaluated.

## 2023-04-27 NOTE — Telephone Encounter (Signed)
 He absolutely needs to go to the emergency department he is in acute renal failure.

## 2023-04-27 NOTE — Progress Notes (Signed)
 Hematology and Oncology Follow Up Visit  Walter Wilkerson [id number] Jan 27, 1950 73 y.o. 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    His hemoptysis has resolved with resolution of his pneumonia.   He was given Lasix 3 weeks ago. He has been taking this as directed. He has noticed improvement in his hand swelling but not in his ankles and legs.   He has responded very nicely to iron and Aranesp.  He has been hydrating like normal. Urinating like normal. No N/V/D.   There is been no problems with leg swelling.  He has had no rashes.  His last iron studies that were done today actually showed a ferritin of 122 with an iron saturation of 34%.  Currently, I would say his performance status is probably ECOG 2.    Wt Readings from Last 3 Encounters:  04/27/23 180 lb (81.6 kg)  04/19/23 186 lb (84.4 kg)  04/18/23 186 lb (84.4 kg)    Medications:  Current Outpatient Medications:    albuterol (VENTOLIN HFA) 108 (90 Base) MCG/ACT inhaler, Inhale 2 puffs into the lungs every 4 (four) hours as needed. (Patient taking differently: Inhale 2 puffs into the lungs every 4 (four) hours as needed for wheezing or shortness of breath.), Disp: 6.7 g, Rfl: 0   aspirin EC 81 MG tablet, Take 81 mg by mouth daily., Disp: , Rfl:    atorvastatin (LIPITOR) 40 MG tablet, Take 1 tablet (40 mg total) by mouth daily., Disp: 90 tablet, Rfl: 3   DULoxetine (CYMBALTA) 30 MG capsule, Take 1 capsule (30 mg total) by mouth daily., Disp: 90 capsule, Rfl: 1   Fluticasone-Umeclidin-Vilant (TRELEGY ELLIPTA) 100-62.5-25 MCG/ACT AEPB, Inhale 1 puff into the lungs daily in the afternoon., Disp: 60 each, Rfl: 5   furosemide (LASIX) 20 MG tablet,  Take 1 tablet (20 mg total) by mouth daily., Disp: 30 tablet, Rfl: 1   gabapentin (NEURONTIN) 300 MG capsule, TAKE 1-2 CAPSULES BY MOUTH UP TO THREE TIMES DAILY, Disp: 180 capsule, Rfl: 1   hydrALAZINE (APRESOLINE) 100 MG tablet, Take 1 tablet (100 mg total) by mouth 2 (two) times daily., Disp: , Rfl:    levothyroxine (SYNTHROID) 200 MCG tablet, Take 1 tablet (200 mcg total) by mouth daily before breakfast., Disp: 30 tablet, Rfl: 0   Multiple Vitamin (MULTIVITAMIN) tablet, Take 1 tablet by mouth daily., Disp: , Rfl:    pantoprazole (PROTONIX) 40 MG tablet, TAKE ONE TABLET BY MOUTH TWICE DAILY, Disp: 180 tablet, Rfl: 1   valsartan-hydrochlorothiazide (DIOVAN-HCT) 320-25 MG tablet, TAKE ONE TABLET BY MOUTH EVERY DAY, Disp: 90 tablet, Rfl: 1  Allergies:  Allergies  Allergen Reactions   Pregabalin Other (See Comments)    Elevated LFTs   Atorvastatin Other (See Comments)    Myalgias.       Lipitor [Atorvastatin Calcium] Other (See Comments)    Myalgias.    Lisinopril Nausea Only   Merbromin Hives   Thimerosal (Thiomersal) Hives    Past Medical History, Surgical history, Social history, and Family History were reviewed and updated.  Review of Systems: Review of Systems  Constitutional: Negative.   HENT:  Negative.    Eyes: Negative.   Respiratory: Negative.  Cardiovascular: Negative.   Gastrointestinal: Negative.   Endocrine: Negative.   Genitourinary: Negative.    Musculoskeletal:  Positive for back pain (chronic unchanged).  Skin: Negative.   Neurological: Negative.   Hematological: Negative.   Psychiatric/Behavioral: Negative.      Physical Exam:  height is 5\' 8"  (1.727 m) and weight is 180 lb (81.6 kg). His oral temperature is 98 F (36.7 C). His blood pressure is 136/69 and his pulse is 76. His respiration is 18 and oxygen saturation is 100%.   Wt Readings from Last 3 Encounters:  04/27/23 180 lb (81.6 kg)  04/19/23 186 lb (84.4 kg)  04/18/23 186 lb (84.4 kg)     Physical Exam Vitals reviewed.  Constitutional:      General: He is not in acute distress. HENT:     Head: Normocephalic and atraumatic.  Eyes:     Pupils: Pupils are equal, round, and reactive to light.  Cardiovascular:     Rate and Rhythm: Normal rate and regular rhythm.     Heart sounds: Normal heart sounds.     Comments: Cardiac exam is regular rate and rhythm.  He has no murmurs, rubs or bruits. Pulmonary:     Effort: Pulmonary effort is normal. No respiratory distress.     Breath sounds: Normal breath sounds. No stridor. No wheezing, rhonchi or rales.     Comments: Pulmonary exam does show some slight wheezing bilaterally.  He has some rhonchi bilaterally.  I do not hear any obvious fluid. Abdominal:     General: Bowel sounds are normal.     Palpations: Abdomen is soft.  Musculoskeletal:        General: No tenderness or deformity. Normal range of motion.     Cervical back: Normal range of motion.  Lymphadenopathy:     Cervical: No cervical adenopathy.  Skin:    General: Skin is warm and dry.     Findings: No erythema or rash.  Neurological:     Mental Status: He is alert and oriented to person, place, and time.  Psychiatric:        Behavior: Behavior normal.        Thought Content: Thought content normal.        Judgment: Judgment normal.     Lab Results  Component Value Date   WBC 3.6 (L) 04/27/2023   HGB 10.3 (L) 04/27/2023   HCT 31.3 (L) 04/27/2023   MCV 86.5 04/27/2023   PLT 268 04/27/2023     Chemistry      Component Value Date/Time   NA 137 04/27/2023 1017   NA 133 (L) 11/24/2022 0928   K 3.9 04/27/2023 1017   CL 102 04/27/2023 1017   CO2 24 04/27/2023 1017   BUN 58 (H) 04/27/2023 1017   BUN 15 11/24/2022 0928   CREATININE 5.10 (H) 04/27/2023 1017   CREATININE 1.44 (H) 01/11/2022 0945   GLU 101 04/01/2013 0000      Component Value Date/Time   CALCIUM 8.8 (L) 04/27/2023 1017   ALKPHOS 69 04/27/2023 1017   AST 12 (L) 04/27/2023 1017   ALT  7 04/27/2023 1017   BILITOT 0.6 04/27/2023 1017      Impression and Plan: Walter Wilkerson is a very nice 73 year old white male with symptomatic anemia.  I suspect that this is multifactorial anemia.  Unfortunately he appears to have an AKI possibly secondary to lasix use though this is unexpected.  He is symptomatic with peripheral edema, mild SOB.  Repeated CMP  is unchanged  He has no history of CHF though he is being worked up for this by PCP He will STOP his lasix- ECHO on 11/25/2022 showed a preserved ejection fraction. Currently being worked up for CHF.  He declines my advisement for ER/hospital evaluation  We reviewed red flags Renal US  and Renal artery US  doppler   PCP alerted  Stop lasix Renal US  order placed   UPDATE: Imaging unable to accommodate patient until 8 pm Discussed patient with on call provider at PCP who agrees with ER evaluation I called patient and upon further discussion and PCP advisement he is agreeable to go to the ER. Triage nurse notified up patients upcoming arrival.    RTC 1 week APP, labs (CBC, CMP)  Sharla Davis, PA-C 4/17/202511:58 AM

## 2023-04-27 NOTE — ED Notes (Addendum)
 Pt denies chest pain and sob at this time. In room with wife and brother, call bell in reach, hooked to monitor

## 2023-04-27 NOTE — ED Provider Notes (Signed)
 [place] EMERGENCY DEPARTMENT AT [facility] Provider Note   CSN: [id number] Arrival date & time: 04/27/23  1636     History Chief Complaint  Patient presents with   Abnormal Labs    HPI Walter Wilkerson is a 73 y.o. male presenting for abnormal labs in the outpatient setting. Is a 73 year old male who is otherwise healthy.  States that a few weeks ago he was started on Lasix for leg swelling. Denies fevers chills nausea vomiting syncope shortness of breath. He is otherwise ambulatory tolerating p.o. intake. States he has been urinating substantially more than baseline has been very active as of recently.   Patient's recorded medical, surgical, social, medication list and allergies were reviewed in the Snapshot window as part of the initial history.   Review of Systems   Review of Systems  Constitutional:  Positive for fatigue. Negative for chills and fever.  HENT:  Negative for ear pain and sore throat.   Eyes:  Negative for pain and visual disturbance.  Respiratory:  Negative for cough and shortness of breath.   Cardiovascular:  Negative for chest pain and palpitations.  Gastrointestinal:  Negative for abdominal pain and vomiting.  Genitourinary:  Negative for dysuria and hematuria.  Musculoskeletal:  Negative for arthralgias and back pain.  Skin:  Negative for color change and rash.  Neurological:  Negative for seizures and syncope.  All other systems reviewed and are negative.   Physical Exam Updated Vital Signs BP (!) 157/81   Pulse 79   Temp 98.7 F (37.1 C) (Oral)   Resp 15   SpO2 98%  Physical Exam Vitals and nursing note reviewed.  Constitutional:      General: He is not in acute distress.    Appearance: He is well-developed.  HENT:     Head: Normocephalic and atraumatic.  Eyes:     Conjunctiva/sclera: Conjunctivae normal.  Cardiovascular:     Rate and Rhythm: Normal rate and regular rhythm.     Heart sounds: No murmur heard. Pulmonary:      Effort: Pulmonary effort is normal. No respiratory distress.     Breath sounds: Normal breath sounds.  Abdominal:     Palpations: Abdomen is soft.     Tenderness: There is no abdominal tenderness.  Musculoskeletal:        General: No swelling.     Cervical back: Neck supple.  Skin:    General: Skin is warm and dry.     Capillary Refill: Capillary refill takes less than 2 seconds.  Neurological:     Mental Status: He is alert.  Psychiatric:        Mood and Affect: Mood normal.      ED Course/ Medical Decision Making/ A&P    Procedures Procedures   Medications Ordered in ED Medications  lactated ringers bolus 1,000 mL (1,000 mLs Intravenous New Bag/Given 04/27/23 1955)    Medical Decision Making:   Walter Wilkerson is a 73 y.o. male who presented to the ED today with outpatient abnormal labs detailed above.    Patient placed on continuous vitals and telemetry monitoring while in ED which was reviewed periodically.  Complete initial physical exam performed, notably the patient  was hemodynamically stable no acute distress.    Reviewed and confirmed nursing documentation for past medical history, family history, social history.    Initial Assessment:   This is most consistent with an acute life/limb threatening illness complicated by underlying chronic conditions. Reviewed outpatient labs that demonstrated an AKI.  Of repeated here in the emergency room which was confirmed. Uncertain if this is prerenal or postrenal. Will order urine electrolytes and a renal ultrasound for further differentiation. Will IV hydrate until further diagnosis is made.  Initial Study Results:   Laboratory  All laboratory results reviewed without evidence of clinically relevant pathology.   Exceptions include: AKI   EKG EKG was reviewed independently. Rate, rhythm, axis, intervals all examined and without medically relevant abnormality. ST segments without concerns for elevations.     Radiology:  All images reviewed independently. Agree with radiology report at this time.   DG Chest 2 View Result Date: 03/29/2023 CLINICAL DATA:  Chronic cough. Recent hemoptysis. History of lung cancer. EXAM: CHEST - 2 VIEW COMPARISON:  CT chest dated November 22, 2022. Chest x-ray dated November 21, 2022. FINDINGS: Stable cardiomediastinal silhouette with normal heart size. Similar postsurgical volume loss in the right hemithorax after right lower lobectomy, with right costophrenic angle pleuroparenchymal scarring and elevation of the right hemidiaphragm. Emphysematous changes again noted with chronically coarsened interstitial markings. New mild increased ill-defined opacities in the superior segment of the left lower lobe. Chronic trace left pleural effusion. No pneumothorax. No acute osseous abnormality. IMPRESSION: 1. New mild increased ill-defined opacities in the superior segment of the left lower lobe, concerning for pneumonia. 2. Chronic trace left pleural effusion. Postsurgical changes of the right lung. Electronically Signed   By: Aleta Anda M.D.   On: 03/29/2023 14:28     Reassessment and Plan:   AKI confirmed on repeat labs today.  No other acute pathology identified   Disposition:   Based on the above findings, I believe this patient is stable for admission.    Patient/family educated about specific findings on our evaluation and explained exact reasons for admission.  Patient/family educated about clinical situation and time was allowed to answer questions.   Admission team communicated with and agreed with need for admission. Patient admitted. Patient ready to move at this time.     Emergency Department Medication Summary:   Medications  lactated ringers bolus 1,000 mL (1,000 mLs Intravenous New Bag/Given 04/27/23 1955)         Clinical Impression:  1. AKI (acute kidney injury) (HCC)      Admit   Final Clinical Impression(s) / ED Diagnoses Final diagnoses:   AKI (acute kidney injury) [facility])    Rx / DC Orders ED Discharge Orders     None         Onetha Bile, MD 04/27/23 2006

## 2023-04-27 NOTE — H&P (Signed)
 History and Physical    Patient: Walter Wilkerson ZOX:[id number] DOB: 1950/06/06 DOA: 04/27/2023 DOS: the patient was seen and examined on 04/27/2023 PCP: Jomarie Longs, PA-C  Patient coming from: Home  Chief Complaint:  Chief Complaint  Patient presents with   Abnormal Labs   HPI: Walter Wilkerson is a 73 y.o. 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  Patient reports that he began having lower extremity swelling over the past month or so.  He was evaluated by his primary care doctor about 10 days ago who recommended wearing compression stockings for suspected chronic venous insufficiency.  He was also prescribed Lasix 20 mg daily at that time with plans for an echocardiogram in the future to ensure that his heart was pumping okay. He states that his leg swelling worsens when he stands on his feet for a long time, progresses throughout day, and improves overnight while he is sleeping. He does note that compression stockings have helped.  He states that he has been doing fine at home and has not experienced any difficulties.  He had a visit with oncology today where he had labs drawn. He received a call from their office stating that his kidney function had worsened and that he needed to come to the ED for further evaluation. He states that he does not drink much water at baseline, typically drinks a lot of tea. He has been urinating somewhat more frequently as he was started on lasix, states that urine alternates between dark yellow and clear. He has not noted any blood in his urine. He is fairly active, works on his lawn and does other activities at home. Denies any nausea, vomiting, fevers, chills, chest pain, palpitations, SHOB, cough, abdominal pain, dysuria.  ED course: Vital  signs stable. CBC with hgb 10.3 (baseline appears to be around 10). CMP with mild hyponatremia, Cr 4.80 (baseline ~1.2). UA with moderate hematuria. Patient given 1L IVF bolus in ED. ED provider ordered renal ultrasound. TRH asked to evaluate patient for admission given AKI.  Review of Systems: As mentioned in the history of present illness. All other systems reviewed and are negative. 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 Jodi Geralds   VASECTOMY     Social History:  reports that he quit smoking about 18 years ago. His smoking use included cigarettes. He started smoking about 48 years ago. He has a 30 pack-year smoking history. He has never used smokeless tobacco. He reports current alcohol use of about 21.0 standard drinks of alcohol per week. He reports that he does not use drugs.  Allergies  Allergen Reactions   Pregabalin Other (See Comments)    Elevated LFTs   Atorvastatin Other (See Comments)    Myalgias.       Lipitor [Atorvastatin Calcium] Other (See Comments)    Myalgias.    Lisinopril Nausea Only   Merbromin Hives   Thimerosal (Thiomersal) Hives    Family History  Problem Relation Age of Onset   Heart attack Father  Hyperlipidemia Father    Hypertension Father    Heart disease Father    Diabetes Other        grandmother     Prior to Admission medications   Medication Sig Start Date End Date Taking? Authorizing Provider  albuterol (VENTOLIN HFA) 108 (90 Base) MCG/ACT inhaler Inhale 2 puffs into the lungs every 4 (four) hours as needed. Patient taking differently: Inhale 2 puffs into the lungs every 4 (four) hours as needed for wheezing or shortness of breath. 12/08/20   Jomarie Longs, PA-C   aspirin EC 81 MG tablet Take 81 mg by mouth daily.    [provider]  atorvastatin (LIPITOR) 40 MG tablet Take 1 tablet (40 mg total) by mouth daily. 11/29/22   Breeback, Lonna Cobb, PA-C  DULoxetine (CYMBALTA) 30 MG capsule Take 1 capsule (30 mg total) by mouth daily. 12/13/22   Breeback, Lonna Cobb, PA-C  Fluticasone-Umeclidin-Vilant (TRELEGY ELLIPTA) 100-62.5-25 MCG/ACT AEPB Inhale 1 puff into the lungs daily in the afternoon. 04/05/22   Coralyn Helling, MD  furosemide (LASIX) 20 MG tablet Take 1 tablet (20 mg total) by mouth daily. 04/18/23   Breeback, Jade L, PA-C  gabapentin (NEURONTIN) 300 MG capsule TAKE 1-2 CAPSULES BY MOUTH UP TO THREE TIMES DAILY 12/20/22   Breeback, Jade L, PA-C  hydrALAZINE (APRESOLINE) 100 MG tablet Take 1 tablet (100 mg total) by mouth 2 (two) times daily. 04/18/23   Jomarie Longs, PA-C  levothyroxine (SYNTHROID) 200 MCG tablet Take 1 tablet (200 mcg total) by mouth daily before breakfast. 12/19/22   Rushie Chestnut, PA-C  Multiple Vitamin (MULTIVITAMIN) tablet Take 1 tablet by mouth daily.    [provider]  pantoprazole (PROTONIX) 40 MG tablet TAKE ONE TABLET BY MOUTH TWICE DAILY 03/20/23   Jomarie Longs, PA-C  valsartan-hydrochlorothiazide (DIOVAN-HCT) 320-25 MG tablet TAKE ONE TABLET BY MOUTH EVERY DAY 10/31/22   Jomarie Longs, [place]    Physical Exam: Vitals:   04/27/23 1845 04/27/23 1900 04/27/23 1915 04/27/23 1953  BP: (!) 159/81 (!) 153/79 (!) 157/81   Pulse: 80 85 79   Resp: 19 20 15    Temp:    98.7 F (37.1 C)  TempSrc:    Oral  SpO2: 98% 97% 98%    Physical Exam Constitutional:      Appearance: He is obese. He is not ill-appearing.  HENT:     Head: Normocephalic and atraumatic.     Mouth/Throat:     Mouth: Mucous membranes are dry.     Pharynx: Oropharynx is clear. No oropharyngeal exudate.  Eyes:     General: No scleral icterus.    Extraocular Movements: Extraocular movements intact.     Conjunctiva/sclera: Conjunctivae  normal.     Pupils: Pupils are equal, round, and reactive to light.  Cardiovascular:     Rate and Rhythm: Normal rate and regular rhythm.     Pulses: Normal pulses.     Heart sounds: Normal heart sounds. No murmur heard.    No friction rub. No gallop.  Pulmonary:     Effort: Pulmonary effort is normal.     Breath sounds: No wheezing, rhonchi or rales.     Comments: Absent breath sounds at right lung base (prior hx of resection). Saturating >95% on RA. Abdominal:     General: Bowel sounds are normal. There is no distension.     Palpations: Abdomen is soft.     Tenderness: There is no abdominal tenderness. There is no guarding  or rebound.  Musculoskeletal:        General: Normal range of motion.     Cervical back: Normal range of motion.     Comments: 1+ pitting edema in bilateral LE  Skin:    General: Skin is warm and dry.  Neurological:     General: No focal deficit present.     Mental Status: He is alert and oriented to person, place, and time.  Psychiatric:        Mood and Affect: Mood normal.        Behavior: Behavior normal.     Data Reviewed:  There are no new results to review at this time.    Latest Ref Rng & Units 04/27/2023    5:34 PM 04/27/2023   10:17 AM 03/29/2023   10:17 AM  CBC  WBC 4.0 - 10.5 K/uL 4.2  3.6  5.6   Hemoglobin 13.0 - 17.0 g/dL 16.1  09.6  04.5   Hematocrit 39.0 - 52.0 % 31.3  31.3  34.9   Platelets 150 - 400 K/uL 266  268  281       Latest Ref Rng & Units 04/27/2023    5:34 PM 04/27/2023   10:17 AM 03/29/2023   10:17 AM  CMP  Glucose 70 - 99 mg/dL 409  811  914   BUN 8 - 23 mg/dL 59  58  23   Creatinine 0.61 - 1.24 mg/dL 7.82  9.56  2.13   Sodium 135 - 145 mmol/L 134  137  136   Potassium 3.5 - 5.1 mmol/L 3.9  3.9  4.0   Chloride 98 - 111 mmol/L 102  102  106   CO2 22 - 32 mmol/L 23  24  24    Calcium 8.9 - 10.3 mg/dL 8.4  8.8  9.0   Total Protein 6.5 - 8.1 g/dL 7.4  7.0  7.6   Total Bilirubin 0.0 - 1.2 mg/dL 0.7  0.6  0.5   Alkaline  Phos 38 - 126 U/L 66  69  94   AST 15 - 41 U/L 15  12  13    ALT 0 - 44 U/L 10  7  8     Urinalysis    Component Value Date/Time   COLORURINE STRAW (A) 04/27/2023 1841   APPEARANCEUR CLEAR 04/27/2023 1841   LABSPEC 1.008 04/27/2023 1841   PHURINE 6.0 04/27/2023 1841   GLUCOSEU NEGATIVE 04/27/2023 1841   HGBUR MODERATE (A) 04/27/2023 1841   BILIRUBINUR NEGATIVE 04/27/2023 1841   KETONESUR NEGATIVE 04/27/2023 1841   PROTEINUR NEGATIVE 04/27/2023 1841   NITRITE NEGATIVE 04/27/2023 1841   LEUKOCYTESUR NEGATIVE 04/27/2023 1841    Assessment and Plan: No notes have been filed under this hospital service. Service: Hospitalist  AKI on CKD 3A Hematuria Patient presenting to the ED with severe AKI after starting lasix 20mg  daily about 10 days ago. His baseline Cr appears to be around 1.2 per chart review, on admission Cr 4.8 [Cr 1.29 on 1/29 >> Cr 1.81 on 3/19 >> Cr 4.8 today]. He reports low water intake at baseline and has remained active at home. I suspect this is likely due to dehydration. Per chart review, appears that patient had an AKI around 03/29/2023 on lab work but I do not see that this was addressed. He was afterwards placed on lasix which likely further aggravated his AKI. His bilateral leg edema appears to be more consistent with chronic venous insufficiency given history. Will stop lasix therapy. He does  also have hematuria on UA, which is suspect is likely renal in origin from prolonged AKI / developing ATN. A renal ultrasound has been ordered to rule out obstruction/hydronephrosis/stones. He is hemodynamically stable and is asymptomatic at this time. Consider nephrology consult if no improvement in kidney function with IVF. -stop lasix -s/p 1.5L IVF bolus, now on maintenance IVF @ 100cc/hr for 10 hours -f/u renal ultrasound -trend kidney function -monitor UOP / strict I/O's -avoid nephrotoxic medications as able -consider nephrology consult tomorrow if no improvement in kidney  function with fluids -heparin subcutaneous for dvt ppx -place in observation  Chronic bilateral LE edema -noted in outpatient setting, suspect this is chronic venous insufficiency -last ECHO in 11/2022 with LVEF 60-65%, normal LV function, no RWMA, normal diastolic parameters -was started on lasix and has been taking for about 10 days but I suspect that this is drying him intravascularly -stop lasix -f/u ECHO (ordered by PCP in outpatient setting but will obtain while inpatient) -knee-high compression stockings, leg elevation  HTN Patient on valsartan-hydrochlorothiazide 320-25mg  daily and hydralazine 100mg  BID at home. BP ranging in the 140s-160s/70s-80s since arrival to ED. Will hold home valsartan-hydrochlorothiazide given AKI. 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 Maria Shiner, in the outpatient setting  OSA -uses CPAP at bedtime at home -resume home CPAP at bedtime  Chronic pain syndrome -chronic lumbar pain -resume home cymbalta 30mg  daily -holding home gabapentin given AKI  Hypothyroidism -resume home synthroid 200mcg daily  GERD  -resume home protonix   Advance Care Planning:   Code Status: Full Code   Consults: none  Family Communication: updated wife at bedside  Severity of Illness: The appropriate patient status for this patient is OBSERVATION. Observation status is judged to be reasonable and necessary in order to provide the required intensity of service to ensure the  patient's safety. The patient's presenting symptoms, physical exam findings, and initial radiographic and laboratory data in the context of their medical condition is felt to place them at decreased risk for further clinical deterioration. Furthermore, it is anticipated that the patient will be medically stable for discharge from the hospital within 2 midnights of admission.   Portions of this note were generated with Scientist, clinical (histocompatibility and immunogenetics). Dictation errors may occur despite best attempts at proofreading.  Author: Mandy Second, MD 04/27/2023 8:39 PM  For on call review [email or web address].

## 2023-04-28 ENCOUNTER — Other Ambulatory Visit: Payer: Self-pay | Admitting: Physician Assistant

## 2023-04-28 ENCOUNTER — Observation Stay (HOSPITAL_COMMUNITY)

## 2023-04-28 ENCOUNTER — Other Ambulatory Visit (HOSPITAL_COMMUNITY)

## 2023-04-28 DIAGNOSIS — N019 Rapidly progressive nephritic syndrome with unspecified morphologic changes: Secondary | ICD-10-CM | POA: Diagnosis not present

## 2023-04-28 DIAGNOSIS — D631 Anemia in chronic kidney disease: Secondary | ICD-10-CM | POA: Diagnosis not present

## 2023-04-28 DIAGNOSIS — I5031 Acute diastolic (congestive) heart failure: Secondary | ICD-10-CM

## 2023-04-28 DIAGNOSIS — E669 Obesity, unspecified: Secondary | ICD-10-CM | POA: Diagnosis present

## 2023-04-28 DIAGNOSIS — D509 Iron deficiency anemia, unspecified: Secondary | ICD-10-CM | POA: Diagnosis not present

## 2023-04-28 DIAGNOSIS — J9601 Acute respiratory failure with hypoxia: Secondary | ICD-10-CM | POA: Diagnosis not present

## 2023-04-28 DIAGNOSIS — I10 Essential (primary) hypertension: Secondary | ICD-10-CM

## 2023-04-28 DIAGNOSIS — E872 Acidosis, unspecified: Secondary | ICD-10-CM | POA: Diagnosis not present

## 2023-04-28 DIAGNOSIS — R011 Cardiac murmur, unspecified: Secondary | ICD-10-CM | POA: Diagnosis not present

## 2023-04-28 DIAGNOSIS — T380X5A Adverse effect of glucocorticoids and synthetic analogues, initial encounter: Secondary | ICD-10-CM | POA: Diagnosis not present

## 2023-04-28 DIAGNOSIS — R918 Other nonspecific abnormal finding of lung field: Secondary | ICD-10-CM | POA: Diagnosis not present

## 2023-04-28 DIAGNOSIS — J9691 Respiratory failure, unspecified with hypoxia: Secondary | ICD-10-CM | POA: Diagnosis not present

## 2023-04-28 DIAGNOSIS — E039 Hypothyroidism, unspecified: Secondary | ICD-10-CM | POA: Diagnosis not present

## 2023-04-28 DIAGNOSIS — R739 Hyperglycemia, unspecified: Secondary | ICD-10-CM | POA: Diagnosis not present

## 2023-04-28 DIAGNOSIS — G894 Chronic pain syndrome: Secondary | ICD-10-CM | POA: Diagnosis present

## 2023-04-28 DIAGNOSIS — E871 Hypo-osmolality and hyponatremia: Secondary | ICD-10-CM | POA: Diagnosis not present

## 2023-04-28 DIAGNOSIS — E860 Dehydration: Secondary | ICD-10-CM | POA: Diagnosis not present

## 2023-04-28 DIAGNOSIS — E559 Vitamin D deficiency, unspecified: Secondary | ICD-10-CM | POA: Diagnosis present

## 2023-04-28 DIAGNOSIS — R319 Hematuria, unspecified: Secondary | ICD-10-CM | POA: Diagnosis not present

## 2023-04-28 DIAGNOSIS — R0789 Other chest pain: Secondary | ICD-10-CM | POA: Diagnosis present

## 2023-04-28 DIAGNOSIS — N179 Acute kidney failure, unspecified: Secondary | ICD-10-CM | POA: Diagnosis present

## 2023-04-28 DIAGNOSIS — R9389 Abnormal findings on diagnostic imaging of other specified body structures: Secondary | ICD-10-CM | POA: Diagnosis not present

## 2023-04-28 DIAGNOSIS — K219 Gastro-esophageal reflux disease without esophagitis: Secondary | ICD-10-CM | POA: Diagnosis present

## 2023-04-28 DIAGNOSIS — I129 Hypertensive chronic kidney disease with stage 1 through stage 4 chronic kidney disease, or unspecified chronic kidney disease: Secondary | ICD-10-CM | POA: Diagnosis present

## 2023-04-28 DIAGNOSIS — G4733 Obstructive sleep apnea (adult) (pediatric): Secondary | ICD-10-CM | POA: Diagnosis present

## 2023-04-28 DIAGNOSIS — N1831 Chronic kidney disease, stage 3a: Secondary | ICD-10-CM | POA: Diagnosis present

## 2023-04-28 DIAGNOSIS — R768 Other specified abnormal immunological findings in serum: Secondary | ICD-10-CM | POA: Diagnosis not present

## 2023-04-28 DIAGNOSIS — D649 Anemia, unspecified: Secondary | ICD-10-CM | POA: Diagnosis not present

## 2023-04-28 DIAGNOSIS — J439 Emphysema, unspecified: Secondary | ICD-10-CM | POA: Diagnosis not present

## 2023-04-28 DIAGNOSIS — N189 Chronic kidney disease, unspecified: Secondary | ICD-10-CM | POA: Diagnosis not present

## 2023-04-28 DIAGNOSIS — M545 Low back pain, unspecified: Secondary | ICD-10-CM | POA: Diagnosis present

## 2023-04-28 DIAGNOSIS — N281 Cyst of kidney, acquired: Secondary | ICD-10-CM | POA: Diagnosis present

## 2023-04-28 DIAGNOSIS — I872 Venous insufficiency (chronic) (peripheral): Secondary | ICD-10-CM | POA: Diagnosis present

## 2023-04-28 LAB — CBC
HCT: 29.6 % — ABNORMAL LOW (ref 39.0–52.0)
Hemoglobin: 9.3 g/dL — ABNORMAL LOW (ref 13.0–17.0)
MCH: 28.4 pg (ref 26.0–34.0)
MCHC: 31.4 g/dL (ref 30.0–36.0)
MCV: 90.5 fL (ref 80.0–100.0)
Platelets: UNDETERMINED 10*3/uL (ref 150–400)
RBC: 3.27 MIL/uL — ABNORMAL LOW (ref 4.22–5.81)
RDW: 17.9 % — ABNORMAL HIGH (ref 11.5–15.5)
WBC: 3.4 10*3/uL — ABNORMAL LOW (ref 4.0–10.5)
nRBC: 0 % (ref 0.0–0.2)

## 2023-04-28 LAB — ECHOCARDIOGRAM COMPLETE
AR max vel: 1.88 cm2
AV Peak grad: 13.2 mmHg
Ao pk vel: 1.82 m/s
Area-P 1/2: 3.48 cm2
Height: 68 in
S' Lateral: 2.9 cm
Weight: 2857.16 [oz_av]

## 2023-04-28 LAB — RENAL FUNCTION PANEL
Albumin: 2.6 g/dL — ABNORMAL LOW (ref 3.5–5.0)
Anion gap: 12 (ref 5–15)
BUN: 55 mg/dL — ABNORMAL HIGH (ref 8–23)
CO2: 19 mmol/L — ABNORMAL LOW (ref 22–32)
Calcium: 8 mg/dL — ABNORMAL LOW (ref 8.9–10.3)
Chloride: 102 mmol/L (ref 98–111)
Creatinine, Ser: 4.69 mg/dL — ABNORMAL HIGH (ref 0.61–1.24)
GFR, Estimated: 12 mL/min — ABNORMAL LOW (ref >60)
Glucose, Bld: 92 mg/dL (ref 70–99)
Phosphorus: 4.3 mg/dL (ref 2.5–4.6)
Potassium: 3.6 mmol/L (ref 3.5–5.1)
Sodium: 133 mmol/L — ABNORMAL LOW (ref 135–145)

## 2023-04-28 MED ORDER — CARVEDILOL 12.5 MG PO TABS
12.5000 mg | ORAL_TABLET | Freq: Two times a day (BID) | ORAL | Status: DC
  Administered 2023-04-28 – 2023-04-29 (×3): 12.5 mg via ORAL
  Filled 2023-04-28 (×3): qty 4

## 2023-04-28 MED ORDER — SODIUM CHLORIDE 0.9 % IV BOLUS
1500.0000 mL | Freq: Once | INTRAVENOUS | Status: AC
  Administered 2023-04-28: 1500 mL via INTRAVENOUS

## 2023-04-28 NOTE — Progress Notes (Signed)
   04/27/23 2358  BiPAP/CPAP/SIPAP  $ Non-Invasive Ventilator  Non-Invasive Vent Initial  $ Face Mask Medium Yes  BiPAP/CPAP/SIPAP Pt Type Adult  BiPAP/CPAP/SIPAP Resmed  Mask Type Full face mask  Mask Size Medium  FiO2 (%) 21 %  Patient Home Machine No  Patient Home Mask No  Patient Home Tubing No  Auto Titrate  (20/5)  BiPAP/CPAP /SiPAP Vitals  Pulse Rate 84  Resp 18  SpO2 97 %  MEWS Score/Color  MEWS Score 0  MEWS Score Color Marrie Sizer

## 2023-04-28 NOTE — Progress Notes (Signed)
 Echocardiogram 2D Echocardiogram has been performed.  Walter Wilkerson 04/28/2023, 11:18 AM

## 2023-04-28 NOTE — Consult Note (Signed)
 Renal Service Consult Note [facility]  Walter Wilkerson 04/28/2023 Walter Sandifer, MD Requesting Physician: Dr. Jilda Wilkerson  Reason for Consult: Renal failure HPI: The patient is a 73 y.o. year-old w/ PMH as below who presented to ED for abnormal renal labs sent from the cancer center.  Patient was started on Lasix  a few weeks ago for leg swelling.  Since starting the Lasix  her she has been voiding substantially more than baseline.  In the ED BP 150/75, HR 79, respiratory rate 18, afebrile.  On room air with 96% O2 sats.  K+ 3.9, BUN 58, creatinine 5.1, albumin  3.4, hemoglobin 10.3.  Received home meds and also 1.5 L LR bolus.  IV fluids were started at 100 cc an hour.  Today the creatinine is down to 4.6.  We are asked to see for renal failure.   Pt seen in hospital room.  The story from the wife and the patient is that he had been having fluid retention in his hands and his feet and ankles and he did not like that because he had similar symptoms when he was diagnosed with lung cancer 17 years ago.  At that time he was treated with removal of two thirds of 1 long and 4 months of chemotherapy.  He now is followed by Dr. Maria Wilkerson with frequent checkups and has not had any recurrence of cancer.  With the swelling and in his hands and feet he was put on Lasix  20 mg a day by his PCP.  Now he is here with a creatinine of 5 when his baseline creatinine is 1.4.  He denies any use of over-the-counter meds such as NSAIDs.  He peed a lot after he was started on Lasix  he says.   ROS - denies CP, no joint pain, no HA, no blurry vision, no rash, no diarrhea, no nausea/ vomiting  PMH: Hypertension Lung cancer Thyroid  disease Anemia  Past Surgical History  Past Surgical History:  Procedure Laterality Date   LUNG LOBECTOMY Right 2008   RLL resection   R knee arthroscopy     REPLACEMENT TOTAL KNEE Right 09/15/2021   Dr. Neil Wilkerson   VASECTOMY     Family History  Family History   Problem Relation Age of Onset   Heart attack Father    Hyperlipidemia Father    Hypertension Father    Heart disease Father    Diabetes Other        grandmother    Social History  reports that he quit smoking about 18 years ago. His smoking use included cigarettes. He started smoking about 48 years ago. He has a 30 pack-year smoking history. He has never used smokeless tobacco. He reports current alcohol use of about 21.0 standard drinks of alcohol per week. He reports that he does not use drugs. Allergies  Allergies  Allergen Reactions   Pregabalin Other (See Comments)    Elevated LFTs   Atorvastatin  Other (See Comments)    Myalgias.       Lipitor [Atorvastatin  Calcium ] Other (See Comments)    Myalgias.    Lisinopril Nausea Only   Merbromin Hives   Thimerosal (Thiomersal) Hives   Home medications Prior to Admission medications   Medication Sig Start Date End Date Taking? Authorizing Provider  predniSONE  (DELTASONE ) 1 MG tablet Take 1 mg by mouth daily with breakfast. 04/27/23  Yes [provider]  albuterol  (VENTOLIN  HFA) 108 (90 Base) MCG/ACT inhaler Inhale 2 puffs into the lungs every 4 (four)  hours as needed. 12/08/20   Wilkerson, Walter L, PA-C  aspirin EC 81 MG tablet Take 81 mg by mouth daily.    [provider]  atorvastatin  (LIPITOR) 40 MG tablet Take 1 tablet (40 mg total) by mouth daily. 11/29/22   Wilkerson, Walter L, PA-C  DULoxetine  (CYMBALTA ) 30 MG capsule Take 1 capsule (30 mg total) by mouth daily. 12/13/22   Wilkerson, Walter L, PA-C  Fluticasone -Umeclidin-Vilant (TRELEGY ELLIPTA ) 100-62.5-25 MCG/ACT AEPB Inhale 1 puff into the lungs daily in the afternoon. 04/05/22   Wilkerson, Vineet, MD  furosemide  (LASIX ) 20 MG tablet Take 1 tablet (20 mg total) by mouth daily. 04/18/23   Wilkerson, Walter L, PA-C  gabapentin  (NEURONTIN ) 300 MG capsule TAKE 1-2 CAPSULES BY MOUTH UP TO THREE TIMES DAILY 12/20/22   Wilkerson, Walter L, PA-C  hydrALAZINE  (APRESOLINE ) 100 MG tablet  Take 1 tablet (100 mg total) by mouth 2 (two) times daily. 04/18/23   Wilkerson, Walter L, PA-C  levothyroxine  (SYNTHROID ) 200 MCG tablet Take 1 tablet (200 mcg total) by mouth daily before breakfast. 12/19/22   Walter Davis, PA-C  Multiple Vitamin (MULTIVITAMIN) tablet Take 1 tablet by mouth daily.    [provider]  pantoprazole  (PROTONIX ) 40 MG tablet TAKE ONE TABLET BY MOUTH TWICE DAILY 03/20/23   Wilkerson, Walter L, PA-C  valsartan -hydrochlorothiazide  (DIOVAN -HCT) 320-25 MG tablet TAKE ONE TABLET BY MOUTH EVERY DAY 10/31/22   Walter Wilkerson, Walter L, PA-C     Vitals:   04/27/23 2218 04/27/23 2358 04/28/23 0040 04/28/23 0435  BP: (!) 162/83  (!) 143/79 (!) 151/79  Pulse:  84 85 81  Resp:  18 18 16   Temp:   98 F (36.7 C) 98.5 F (36.9 C)  TempSrc:      SpO2:  97% 96% 97%  Weight:      Height:       Exam Gen alert, no distress No rash, cyanosis or gangrene Sclera anicteric, throat clear  No jvd or bruits Chest clear bilat to bases, no rales/ wheezing RRR no MRG Abd soft ntnd no mass or ascites +bs GU normal male MS no joint effusions or deformity Ext no LE or UE edema, no other edema Neuro is alert, Ox 3 , nf    Renal-related home meds: Lasix  20 mg daily Valsartan -HCTZ 320-25mg  once daily Hydralazine  100 twice daily Others: Synthroid , PPI, Trelegy Ellipta , Cymbalta , statin, gabapentin , aspirin, prednisone   Date   Creat  eGFR (ml/min) 2015- 2022  1.05- 1.40 2023   1.16- 1.52 2024   1.03- 1.85 38- >60  Jan 2025  1.23- 1.29 59- >60 Mar 2025  1.81  39  04/27/23  5.10, 4.80 04/28/23  4.69  UA 4/17 - mod Hb, >50 rbc, 0-5 wbc/ epi, prot neg UNa - 109 Renal US  - 9/5/ 12.0 cm kidneys w/o hydro     Assessment/ Plan: AKI on CKD 3a - b/l creat 1.3- 1.8, from early 2025, eGFR 39-59 ml/min. Creat here was 5.1 on admission in the setting of recent addition of Lasix  to his medication regimen.  He was concerned about swelling in his hands and feet.  Now a short time after  taking Lasix  and being on valsartan  which is an ARB, he has Wilkerson likely dehydrated himself and caused AKI related to volume depletion and ARB effects.  The treatment is hold the ARB and give fluids until creatinine comes down.  I will bolus another 1.5 L and continue IV fluids at 100 cc/h.  The ARB should be  held for a couple of weeks until this resolves as should the Lasix  be held.  The Lasix  is obviously too much for his mild edema.  He still thinks his ankles are swollen which they are clearly not.  He is worried about recurrent lung cancer I think that is probably driving this whole thing.  But he is followed by oncology with frequent visits and no signs of recurrence.  Will follow.  Hypertension: hold ARB for 2 to 3 weeks, stop the Lasix  for good.  Recommend another antihypertensive such as Coreg  in the meantime. Volume: Patient is not volume overloaded and has no edema on exam Hematuria: This is not accompanied by proteinuria and is unlikely to be related to kidney disease. History of iron  deficiency anemia History of COPD History of lung cancer 2009      Larry Poag  MD [facility] 04/28/2023, 3:10 PM  Recent Labs  Lab 04/27/23 1734 04/28/23 0411  HGB 10.3* 9.3*  ALBUMIN  3.2* 2.6*  CALCIUM  8.4* 8.0*  PHOS  --  4.3  CREATININE 4.80* 4.69*  K 3.9 3.6   Inpatient medications:  atorvastatin   40 mg Oral Daily   budeson-glycopyrrolate -formoterol   2 puff Inhalation BID   DULoxetine   30 mg Oral Daily   heparin   5,000 Units Subcutaneous Q8H   hydrALAZINE   100 mg Oral BID   levothyroxine   175 mcg Oral Q0600   pantoprazole   40 mg Oral Daily    lactated ringers  100 mL/hr at 04/28/23 0837   acetaminophen  **OR** acetaminophen 

## 2023-04-28 NOTE — TOC Initial Note (Signed)
 Transition of Care [facility]) - Initial/Assessment Note    Patient Details  Name: Walter Wilkerson MRN: [id number] Date of Birth: March 23, 1950  Transition of Care [facility]) CM/SW Contact:    Kathryn Parish, RN Phone Number: 04/28/2023, 3:58 PM  Clinical Narrative:                 [facility] PTA states functions independently; lives in a single family house with spouse and son; drives self to appointments;verified PCP and insurance; no SDOH needs; DME (CPAP with [employer]), no HH or oxygen; transportation at discharge will be wife,son or brother via private vehicle. No TOC need identified during visit.  Expected Discharge Plan: Home/Self Care Barriers to Discharge: Continued Medical Work up   Patient Goals and CMS Choice            Expected Discharge Plan and Services   Discharge Planning Services: CM Consult   Living arrangements for the past 2 months: Single Family Home                 DME Arranged: N/A DME Agency: NA       HH Arranged: NA HH Agency: NA        Prior Living Arrangements/Services Living arrangements for the past 2 months: Single Family Home Lives with:: Adult Children, Spouse Patient language and need for interpreter reviewed:: Yes Do you feel safe going back to the place where you live?: Yes      Need for Family Participation in Patient Care: No (Comment) Care giver support system in place?: Yes (comment) Current home services: DME (CPAP with [employer]) Criminal Activity/Legal Involvement Pertinent to Current Situation/Hospitalization: No - Comment as needed  Activities of Daily Living   ADL Screening (condition at time of admission) Independently performs ADLs?: Yes (appropriate for developmental age) Is the patient deaf or have difficulty hearing?: No Does the patient have difficulty seeing, even when wearing glasses/contacts?: No Does the patient have difficulty concentrating, remembering, or making decisions?:  No  Permission Sought/Granted Permission sought to share information with : Case Manager Permission granted to share information with : Yes, Verbal Permission Granted  Share Information with NAME: Emmons Toth     Permission granted to share info w Relationship: spouse  Permission granted to share info w Contact Information: [phone]  Emotional Assessment Appearance:: Appears stated age Attitude/Demeanor/Rapport: Engaged Affect (typically observed): Appropriate, Accepting Orientation: : Oriented to Self, Oriented to Place, Oriented to  Time, Oriented to Situation   Psych Involvement: No (comment)  Admission diagnosis:  AKI (acute kidney injury) (HCC) [N17.9] Acute kidney injury (HCC) [N17.9] Patient Active Problem List   Diagnosis Date Noted   Acute kidney injury (HCC) 04/28/2023   AKI (acute kidney injury) (HCC) 04/27/2023   Bilateral lower extremity edema 04/21/2023   SOB (shortness of breath) on exertion 04/21/2023   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   Iron  deficiency anemia 11/25/2022   Pericardial effusion 11/24/2022   Thrombocytopenia with normocytic iron  deficiency anemia 11/24/2022   Elevated d-dimer 11/22/2022   Acute cough 11/22/2022   Community acquired pneumonia of left upper lobe of lung 11/21/2022   Shortness of breath 11/21/2022   Tonsillar mass 11/07/2022   Macrocytic anemia 09/09/2022   Neck pain on right side 06/14/2022   Thoracic aortic aneurysm 40 mm very of CT from 2024 04/20/2022   Multiple lung nodules on CT 03/25/2022   Abnormal CT scan, lung 03/25/2022   History of lung cancer 03/25/2022   Abnormal CT  scan of lung 03/08/2022   Centrilobular emphysema (HCC) 03/08/2022   Intermittent chest pain 03/08/2022   Palpitations 03/08/2022   Newly recognized heart murmur 03/08/2022   Hand cramp 04/14/2021   Polyarthritis 02/16/2021   Allergic sinusitis 06/03/2020   Lumbar spondylosis 12/03/2019   Right hand pain 12/03/2019   Primary  osteoarthritis involving multiple joints 12/03/2019   Muscle cramps 09/03/2019   Chronic pain syndrome 05/29/2018   Epigastric pain 04/20/2017   Fatigue 11/04/2016   Elevated serum creatinine 02/10/2016   Acquired hypothyroidism 08/11/2014   Essential hypertension 08/11/2014   PCP:  [facility]:   [facility] [place], [place] - [address] [place] [address] Phone: [phone] Fax: [phone]     Social Drivers of Health (SDOH) Social History: SDOH Screenings   Food Insecurity: No Food Insecurity (04/27/2023)  Housing: Low Risk  (04/27/2023)  Transportation Needs: No Transportation Needs (04/27/2023)  Utilities: Not At Risk (04/27/2023)  Alcohol Screen: Low Risk  (04/19/2023)  Depression (PHQ2-9): Low Risk  (04/19/2023)  Financial Resource Strain: Low Risk  (04/19/2023)  Physical Activity: Insufficiently Active (04/19/2023)  Social Connections: Socially Integrated (04/27/2023)  Stress: No Stress Concern Present (04/19/2023)  Tobacco Use: Medium Risk (04/27/2023)  Health Literacy: Adequate Health Literacy (04/19/2023)   SDOH Interventions:     Readmission Risk Interventions     No data to display

## 2023-04-28 NOTE — Progress Notes (Signed)
 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   Assessment and Plan:  AKI on CKD 3A Hematuria Patient presenting to the ED with severe AKI after starting lasix  20mg  daily about 10 days ago for LE edema.  Of note, the patient had also been on Diovan -HCT. His baseline Cr appears to be around 1.2. Presented with of 4.8 UA showed some hemoglobin but otherwise unremarkable.  Renal ultrasound: no hydronephrosis.  Most likely prerenal AKI with developing ATN. - Dced lasix .  - Hold home Diovan -HCT - continue IV fluids.  - Monitor urine output.  - Avoid nephrotoxins. - Daily BMP - Nephrology consulted.    Chronic bilateral LE edema -noted in outpatient setting, suspect this is chronic venous insufficiency -last ECHO in 11/2022 with LVEF 60-65%, normal LV function, no RWMA, normal diastolic parameters -was started on lasix , which led to intravascular depletion. -stopped lasix  -f/u ECHO (ordered by PCP in outpatient setting but will obtain while inpatient) -knee-high compression stockings, leg elevation   HTN Patient on valsartan -hydrochlorothiazide  320-25mg  daily and hydralazine  100mg  BID at home. - Holding home valsartan -hydrochlorothiazide  given AKI. 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 Timmy, in the outpatient setting   OSA -uses CPAP at bedtime at home -resume home CPAP at bedtime   Chronic pain syndrome -chronic lumbar pain -resume home cymbalta  30mg  daily -holding home gabapentin  given AKI   Hypothyroidism -resume home synthroid  200mcg daily   GERD  -resume home protonix      Subjective: Patient has no new complaints. He states he developed a headache with our CPAP mask. His wife is bringing his home CPAP machine today.   Physical Exam: Vitals:   04/27/23 2218 04/27/23 2358 04/28/23 0040 04/28/23 0435  BP: (!) 162/83  (!) 143/79 (!) 151/79  Pulse:  84 85 81  Resp:  18 18 16   Temp:   98 F (36.7 C) 98.5 F (36.9 C)  TempSrc:      SpO2:  97% 96% 97%  Weight:      Height:         General: Alert, oriented X3  Eyes: Pupils equal, reactive  Oral cavity: moist mucous membranes  Head: Atraumatic, normocephalic  Neck: supple  Chest: clear to auscultation. No crackles, no wheezes  CVS: S1,S2 RRR. No murmurs  Abd: No distention, soft, non-tender. No masses palpable  Extr: Bilateral lower extremity edema MSK: No joint deformities or swelling  Neurological: Grossly intact.     Data Reviewed:      Latest Ref Rng & Units 04/28/2023    4:11 AM 04/27/2023    5:34 PM 04/27/2023   10:17 AM  CBC  WBC 4.0 - 10.5 K/uL 3.4  4.2  3.6  Hemoglobin 13.0 - 17.0 g/dL 9.3  89.6  89.6   Hematocrit 39.0 - 52.0 % 29.6  31.3  31.3   Platelets 150 - 400 K/uL PLATELET CLUMPS NOTED ON SMEAR, UNABLE TO ESTIMATE  266  268       Latest Ref Rng & Units 04/28/2023    4:11 AM 04/27/2023    5:34 PM 04/27/2023   10:17 AM  BMP  Glucose 70 - 99 mg/dL 92  893  891   BUN 8 - 23 mg/dL 55  59  58   Creatinine 0.61 - 1.24 mg/dL 5.30  5.19  4.89   Sodium 135 - 145 mmol/L 133  134  137   Potassium 3.5 - 5.1 mmol/L 3.6  3.9  3.9   Chloride 98 - 111  mmol/L 102  102  102   CO2 22 - 32 mmol/L 19  23  24    Calcium  8.9 - 10.3 mg/dL 8.0  8.4  8.8      Family Communication: n/a  Disposition: Status is: Inpatient   Planned Discharge Destination: Home  DVT ppx: SQ heparin      Time spent: 50 minutes  Author: MDALA-GAUSI, Panayiotis [place] AGATHA, MD 04/28/2023 2:17 PM  For on call review [email or web address].

## 2023-04-29 ENCOUNTER — Other Ambulatory Visit: Payer: Self-pay | Admitting: Physician Assistant

## 2023-04-29 DIAGNOSIS — N179 Acute kidney failure, unspecified: Secondary | ICD-10-CM | POA: Diagnosis not present

## 2023-04-29 DIAGNOSIS — I10 Essential (primary) hypertension: Secondary | ICD-10-CM

## 2023-04-29 LAB — CBC
HCT: 28.5 % — ABNORMAL LOW (ref 39.0–52.0)
Hemoglobin: 9.5 g/dL — ABNORMAL LOW (ref 13.0–17.0)
MCH: 29.2 pg (ref 26.0–34.0)
MCHC: 33.3 g/dL (ref 30.0–36.0)
MCV: 87.7 fL (ref 80.0–100.0)
Platelets: 199 10*3/uL (ref 150–400)
RBC: 3.25 MIL/uL — ABNORMAL LOW (ref 4.22–5.81)
RDW: 17.9 % — ABNORMAL HIGH (ref 11.5–15.5)
WBC: 3.8 10*3/uL — ABNORMAL LOW (ref 4.0–10.5)
nRBC: 0 % (ref 0.0–0.2)

## 2023-04-29 LAB — URINE CULTURE: Culture: NO GROWTH

## 2023-04-29 LAB — RENAL FUNCTION PANEL
Albumin: 2.7 g/dL — ABNORMAL LOW (ref 3.5–5.0)
Anion gap: 7 (ref 5–15)
BUN: 53 mg/dL — ABNORMAL HIGH (ref 8–23)
CO2: 23 mmol/L (ref 22–32)
Calcium: 8.3 mg/dL — ABNORMAL LOW (ref 8.9–10.3)
Chloride: 104 mmol/L (ref 98–111)
Creatinine, Ser: 4.74 mg/dL — ABNORMAL HIGH (ref 0.61–1.24)
GFR, Estimated: 12 mL/min — ABNORMAL LOW (ref >60)
Glucose, Bld: 100 mg/dL — ABNORMAL HIGH (ref 70–99)
Phosphorus: 4 mg/dL (ref 2.5–4.6)
Potassium: 4 mmol/L (ref 3.5–5.1)
Sodium: 134 mmol/L — ABNORMAL LOW (ref 135–145)

## 2023-04-29 LAB — MAGNESIUM: Magnesium: 1.3 mg/dL — ABNORMAL LOW (ref 1.7–2.4)

## 2023-04-29 MED ORDER — CARVEDILOL 6.25 MG PO TABS
6.2500 mg | ORAL_TABLET | Freq: Two times a day (BID) | ORAL | Status: DC
  Administered 2023-04-30 – 2023-05-11 (×23): 6.25 mg via ORAL
  Filled 2023-04-29: qty 1
  Filled 2023-04-29: qty 2
  Filled 2023-04-29 (×2): qty 1
  Filled 2023-04-29: qty 2
  Filled 2023-04-29: qty 1
  Filled 2023-04-29 (×5): qty 2
  Filled 2023-04-29: qty 1
  Filled 2023-04-29 (×2): qty 2
  Filled 2023-04-29: qty 1
  Filled 2023-04-29 (×2): qty 2
  Filled 2023-04-29 (×2): qty 1
  Filled 2023-04-29: qty 2
  Filled 2023-04-29: qty 1
  Filled 2023-04-29: qty 2
  Filled 2023-04-29: qty 1

## 2023-04-29 MED ORDER — LACTATED RINGERS IV BOLUS
1000.0000 mL | Freq: Once | INTRAVENOUS | Status: AC
  Administered 2023-04-29: 1000 mL via INTRAVENOUS

## 2023-04-29 MED ORDER — ONDANSETRON HCL 4 MG PO TABS
4.0000 mg | ORAL_TABLET | Freq: Three times a day (TID) | ORAL | Status: DC | PRN
  Administered 2023-04-30: 4 mg via ORAL
  Filled 2023-04-29: qty 1

## 2023-04-29 MED ORDER — LACTATED RINGERS IV SOLN
INTRAVENOUS | Status: DC
Start: 2023-04-29 — End: 2023-04-30

## 2023-04-29 MED ORDER — MAGNESIUM SULFATE 2 GM/50ML IV SOLN: 2.0000 g | Freq: Once | INTRAVENOUS | Status: DC

## 2023-04-29 MED ORDER — MAGNESIUM SULFATE 4 GM/100ML IV SOLN
4.0000 g | Freq: Once | INTRAVENOUS | Status: AC
  Administered 2023-04-29: 4 g via INTRAVENOUS
  Filled 2023-04-29: qty 100

## 2023-04-29 NOTE — Plan of Care (Signed)

## 2023-04-29 NOTE — Progress Notes (Signed)
 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   Assessment and Plan:  AKI on CKD 3A Hematuria Patient presenting to the ED with severe AKI after starting lasix  20mg  daily about 10 days ago for LE edema.  Of note, the patient had also been on Diovan -HCT. His baseline Cr appears to be around 1.2. Presented with of 4.8 UA showed some hemoglobin but otherwise unremarkable.  Renal ultrasound: no hydronephrosis.  Most likely prerenal AKI with ATN. Patient has received IV fluids overnight, but creatinine has not improved. He continues to have good urine output. Nephrology following.  Input appreciated. - Dced lasix .  - Holding home Diovan -HCT - IV fluids discontinued - Monitor urine output.  - Avoid nephrotoxins. - Daily BMP  Hypomagnesemia - Replete as needed.   Chronic bilateral LE edema -noted in outpatient setting, suspect this is chronic venous insufficiency -last ECHO in 11/2022 with LVEF 60-65%, normal LV function, no RWMA, normal diastolic parameters -was started on lasix , which led to intravascular depletion. -stopped lasix  -f/u ECHO (ordered by PCP in outpatient setting but will obtain while inpatient) -knee-high compression stockings, leg elevation   HTN Patient on valsartan -hydrochlorothiazide  320-25mg  daily and hydralazine  100mg  BID at home. - Holding home valsartan -hydrochlorothiazide  given AKI. -resume home hydralazine  100mg  BID - Patient has been started on Coreg .    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 Maria Shiner, in the outpatient setting   OSA -uses CPAP at bedtime at home -resume home CPAP at bedtime   Chronic pain syndrome -chronic lumbar pain -resume home cymbalta  30mg  daily -holding home gabapentin  given AKI   Hypothyroidism -resume home synthroid  200mcg daily   GERD  -resume home protonix      Subjective: Patient complains of nausea.  Creatinine has remained more or less the same.  Magnesium  1.3  Physical Exam: Vitals:   04/28/23 1935 04/29/23 0513 04/29/23 0552 04/29/23 1251  BP: 135/69 136/74  124/62  Pulse: 73 75  67  Resp: 18 20  18   Temp: 98.2 F (36.8 C) 98 F (36.7 C)  98.1 F (36.7 C)  TempSrc: Oral Oral  Oral  SpO2: 97% 92% 98% 98%  Weight:      Height:         General: Alert, oriented X3  Eyes: Pupils equal, reactive  Oral cavity: moist mucous membranes  Head: Atraumatic, normocephalic  Neck: supple  Chest: clear to auscultation.  Few crackles in lung bases CVS: S1,S2 RRR. No murmurs  Abd: No distention, soft, non-tender. No masses palpable  Extr: No pedal edema MSK: No joint deformities or swelling  Neurological: Grossly intact.     Data Reviewed:      Latest Ref Rng & Units 04/29/2023    5:00 AM 04/28/2023  4:11 AM 04/27/2023    5:34 PM  CBC  WBC 4.0 - 10.5 K/uL 3.8  3.4  4.2   Hemoglobin 13.0 - 17.0 g/dL 9.5  9.3  45.4   Hematocrit 39.0 - 52.0 % 28.5  29.6  31.3   Platelets 150 - 400 K/uL 199  PLATELET CLUMPS NOTED ON SMEAR, UNABLE TO ESTIMATE  266       Latest Ref Rng & Units 04/29/2023    5:00 AM 04/28/2023    4:11 AM 04/27/2023    5:34 PM  BMP  Glucose 70 - 99 mg/dL 098  92  119   BUN 8 - 23  mg/dL 53  55  59   Creatinine 0.61 - 1.24 mg/dL 1.47  8.29  5.62   Sodium 135 - 145 mmol/L 134  133  134   Potassium 3.5 - 5.1 mmol/L 4.0  3.6  3.9   Chloride 98 - 111 mmol/L 104  102  102   CO2 22 - 32 mmol/L 23  19  23    Calcium  8.9 - 10.3 mg/dL 8.3  8.0  8.4      Family Communication: n/a  Disposition: Status is: Inpatient   Planned Discharge Destination: Home  DVT ppx: SQ heparin      Time spent: 40 minutes  Author: MDALA-GAUSI, Walter Cowdery AGATHA, MD 04/29/2023 2:16 PM  For on call review [email or web address].

## 2023-04-29 NOTE — Progress Notes (Addendum)
 [facility] Progress Note  Subjective:  2.9 L in and 2.2 L out yesterday  Vitals:   04/27/23 2358 04/28/23 0040 04/28/23 0435 04/28/23 1935  BP:  (!) 143/79 (!) 151/79 135/69  Pulse: 84 85 81 73  Resp: 18 18 16 18   Temp:  98 F (36.7 C) 98.5 F (36.9 C) 98.2 F (36.8 C)  TempSrc:    Oral  SpO2: 97% 96% 97% 97%  Weight:      Height:        Exam: Gen alert, no distress No jvd or bruits Chest clear bilat to bases RRR no MRG Abd soft ntnd no mass or ascites +bs Ext no LE or UE edema Neuro is alert, Ox 3 , nf      Renal-related home meds: Lasix  20 mg daily Valsartan -HCTZ 320-25mg  once daily Hydralazine  100 twice daily Others: Synthroid , PPI, Trelegy Ellipta , Cymbalta , statin, gabapentin , aspirin, prednisone    Date                             Creat               eGFR (ml/min) 2015- 2022                  1.05- 1.40 2023                            1.16- 1.52 2024                            1.03- 1.85        38- >60             Jan 2025                     1.23- 1.29        59- >60 Mar 2025                     1.81                 39         04/27/23                        5.10, 4.80 04/28/23                        4.69  12 4/19    4.74   12   UA 4/17 - mod Hb, >50 rbc, 0-5 wbc/ epi, prot neg UNa - 109 Renal US  - 9.5/ 12.0 cm kidneys w/o hydro  Urine sediment (04/29/23, undersigned) --> sheets of non-dysmorphic single rbc's, no granular or cellular casts, no wbc's or bacteria     Assessment/ Plan: AKI on CKD 3a - b/l creat 1.3- 1.8, from early 2025, eGFR 39-59 ml/min. Creat here was 5.1 on admission in the setting of swelling of feet/ hands leading to prescription of low-dose Lasix  within the last few weeks. AKI likely hemodynamic due to overdiuresis + ARB effects, and/or could have ATN. Recommend d/c lasix  and hold ARB for now, also gave 1.75 L bolus yesterday and cont'd IVF's. Good UOP overnight but creat no better today. Will rebolus 1 L and cont IVF's,  will look at urine sediment.  Hypertension: holding ARB, added Coreg . Bp's  down, will lower coreg  today. Cont to hold lasix  Volume: Patient is not volume overloaded and has no edema on exam Hematuria: This is not accompanied by proteinuria and is unlikely to be related to kidney disease. History of iron  deficiency anemia History of COPD History of lung cancer 2009    Larry Poag MD  [facility] 04/29/2023, 5:12 AM  Recent Labs  Lab 04/27/23 1734 04/28/23 0411  HGB 10.3* 9.3*  ALBUMIN  3.2* 2.6*  CALCIUM  8.4* 8.0*  PHOS  --  4.3  CREATININE 4.80* 4.69*  K 3.9 3.6   Recent Labs  Lab 04/27/23 1017 04/27/23 1018  IRON  51  --   TIBC 239*  --   FERRITIN  --  154   Inpatient medications:  atorvastatin   40 mg Oral Daily   budeson-glycopyrrolate -formoterol   2 puff Inhalation BID   carvedilol   12.5 mg Oral BID WC   DULoxetine   30 mg Oral Daily   heparin   5,000 Units Subcutaneous Q8H   hydrALAZINE   100 mg Oral BID   levothyroxine   175 mcg Oral Q0600   pantoprazole   40 mg Oral Daily    lactated ringers  100 mL/hr at 04/28/23 2042   acetaminophen  **OR** acetaminophen 

## 2023-04-29 NOTE — Progress Notes (Signed)
 Patient has refused CPAP for tonight.  Patient stated that our machine is giving him a headache.  Made RN aware.

## 2023-04-30 DIAGNOSIS — N179 Acute kidney failure, unspecified: Secondary | ICD-10-CM | POA: Diagnosis not present

## 2023-04-30 LAB — CBC
HCT: 29.6 % — ABNORMAL LOW (ref 39.0–52.0)
Hemoglobin: 9.5 g/dL — ABNORMAL LOW (ref 13.0–17.0)
MCH: 28.8 pg (ref 26.0–34.0)
MCHC: 32.1 g/dL (ref 30.0–36.0)
MCV: 89.7 fL (ref 80.0–100.0)
Platelets: 137 10*3/uL — ABNORMAL LOW (ref 150–400)
RBC: 3.3 MIL/uL — ABNORMAL LOW (ref 4.22–5.81)
RDW: 17.8 % — ABNORMAL HIGH (ref 11.5–15.5)
WBC: 4.4 10*3/uL (ref 4.0–10.5)
nRBC: 0 % (ref 0.0–0.2)

## 2023-04-30 LAB — HEPATITIS PANEL, ACUTE
HCV Ab: NONREACTIVE
Hep A IgM: NONREACTIVE
Hep B C IgM: NONREACTIVE
Hepatitis B Surface Ag: NONREACTIVE

## 2023-04-30 LAB — RENAL FUNCTION PANEL
Albumin: 2.8 g/dL — ABNORMAL LOW (ref 3.5–5.0)
Anion gap: 11 (ref 5–15)
BUN: 45 mg/dL — ABNORMAL HIGH (ref 8–23)
CO2: 20 mmol/L — ABNORMAL LOW (ref 22–32)
Calcium: 8.7 mg/dL — ABNORMAL LOW (ref 8.9–10.3)
Chloride: 102 mmol/L (ref 98–111)
Creatinine, Ser: 4.62 mg/dL — ABNORMAL HIGH (ref 0.61–1.24)
GFR, Estimated: 13 mL/min — ABNORMAL LOW (ref >60)
Glucose, Bld: 99 mg/dL (ref 70–99)
Phosphorus: 3.7 mg/dL (ref 2.5–4.6)
Potassium: 3.9 mmol/L (ref 3.5–5.1)
Sodium: 133 mmol/L — ABNORMAL LOW (ref 135–145)

## 2023-04-30 LAB — VITAMIN D 25 HYDROXY (VIT D DEFICIENCY, FRACTURES): Vit D, 25-Hydroxy: 27.69 ng/mL — ABNORMAL LOW (ref 30–100)

## 2023-04-30 LAB — MAGNESIUM: Magnesium: 2.2 mg/dL (ref 1.7–2.4)

## 2023-04-30 MED ORDER — FUROSEMIDE 10 MG/ML IJ SOLN
80.0000 mg | Freq: Once | INTRAMUSCULAR | Status: AC
  Administered 2023-04-30: 80 mg via INTRAVENOUS
  Filled 2023-04-30: qty 8

## 2023-04-30 MED ORDER — FUROSEMIDE 10 MG/ML IJ SOLN: 80.0000 mg | Freq: Two times a day (BID) | INTRAMUSCULAR | Status: DC

## 2023-04-30 MED ORDER — METOCLOPRAMIDE HCL 5 MG/ML IJ SOLN
5.0000 mg | Freq: Three times a day (TID) | INTRAMUSCULAR | Status: DC | PRN
  Administered 2023-04-30: 5 mg via INTRAVENOUS
  Filled 2023-04-30: qty 2

## 2023-04-30 NOTE — Plan of Care (Signed)

## 2023-04-30 NOTE — Progress Notes (Signed)
 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   Assessment and Plan:  # AKI on CKD 3A # Hematuria Patient presenting to the ED with severe AKI after starting lasix  20mg  daily about 10 days ago for LE edema.  Of note, the patient had also been on Diovan -HCT. His baseline Cr appears to be around 1.2. Presented with of 4.8 UA showed some hemoglobin but otherwise unremarkable.  Renal ultrasound: no hydronephrosis.  Most likely prerenal AKI with ATN. Patient has received IV fluids, but creatinine has not improved. He continues to have good urine output. Nephrology following.  Input appreciated. - Dced lasix .  - Holding home Diovan -HCT - IV fluids discontinued - Monitor urine output.  - Avoid nephrotoxins. - Daily BMP 4/20 DOE today, DC'd IV fluid and Lasix  80 mg IV x1 dose given as per nephro  # Hypomagnesemia, mag repleted.  Resolved Monitor electrolytes and replete as needed  Chronic bilateral LE edema -noted in outpatient setting, suspect this is chronic venous insufficiency -last ECHO in 11/2022 with LVEF 60-65%, normal LV function, no WMA, normal diastolic parameters -was started on lasix , which led to intravascular depletion. -stopped lasix  -knee-high compression stockings, leg elevation 4/18 TTE LVEF 65 to 70%, no WMA mild LV hypertrophy,  HTN Patient on valsartan -hydrochlorothiazide  320-25mg  daily and hydralazine  100mg  BID at home. - Holding home valsartan -hydrochlorothiazide  given AKI. -resume home hydralazine  100mg  BID - Patient has been started on Coreg .    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 Maria Shiner, in the outpatient setting   OSA -uses CPAP at bedtime at home -resume home CPAP at bedtime   Chronic pain syndrome -chronic lumbar pain -resume home cymbalta  30mg  daily -holding home gabapentin  given AKI   Hypothyroidism -resume home synthroid  200mcg daily   GERD  -resume home protonix   Nausea Complaining of persistent nausea for past 3 weeks, denied any vomiting Moving bowels, no constipation Continue Zofran  IV as needed 4/20 use Reglan  as needed if refractory nausea or vomiting       Subjective: No significant events overnight, patient does not feel good, stated that he feels crap, having some nausea, denied any vomiting.  Moving bowels, no constipation. Denied any chest pain palpitations, no nasal complaints.   Physical Exam: Vitals:   04/29/23 0552 04/29/23 1251 04/29/23 1934 04/30/23 0408  BP:  124/62 (!) 150/75 (!) 164/85  Pulse:  67 71 78  Resp:  18 18 18   Temp:  98.1 F (36.7 C) 97.8 F (36.6 C) 98.1 F (36.7 C)  TempSrc:  Oral Oral   SpO2: 98% 98% 95% 93%  Weight:      Height:         General: Alert, oriented X3  Eyes: Pupils equal, reactive  Oral  cavity: moist mucous membranes  Head: Atraumatic, normocephalic  Neck: supple  Chest: clear to auscultation.  Few bibasilar crackles, no wheezes CVS: S1,S2 RRR. No murmurs  Abd: No distention, soft, non-tender. No masses palpable  Extr: No pedal edema MSK: No joint deformities or swelling  Neurological: Grossly intact.     Data Reviewed:      Latest Ref Rng & Units  04/30/2023    4:02 AM 04/29/2023    5:00 AM 04/28/2023    4:11 AM  CBC  WBC 4.0 - 10.5 K/uL 4.4  3.8  3.4   Hemoglobin 13.0 - 17.0 g/dL 9.5  9.5  9.3   Hematocrit 39.0 - 52.0 % 29.6  28.5  29.6   Platelets 150 - 400 K/uL 137  199  PLATELET CLUMPS NOTED ON SMEAR, UNABLE TO ESTIMATE       Latest Ref Rng & Units 04/30/2023    4:02 AM 04/29/2023    5:00 AM 04/28/2023    4:11 AM  BMP  Glucose 70 - 99 mg/dL 99  366  92   BUN 8 - 23 mg/dL 45  53  55   Creatinine 0.61 - 1.24 mg/dL 4.40  3.47  4.25   Sodium 135 - 145 mmol/L 133  134  133   Potassium 3.5 - 5.1 mmol/L 3.9  4.0  3.6   Chloride 98 - 111 mmol/L 102  104  102   CO2 22 - 32 mmol/L 20  23  19    Calcium  8.9 - 10.3 mg/dL 8.7  8.3  8.0      Family Communication: n/a  Disposition: Status is: Inpatient   Planned Discharge Destination: Home  DVT ppx: SQ heparin      Time spent: 40 minutes  Author: Althia Atlas, MD 04/30/2023 3:07 PM  For on call review [email or web address].

## 2023-04-30 NOTE — Progress Notes (Signed)
 [facility] Progress Note  Subjective:  100 cc UOP recorded yest BP's normal to high Creat 4.6 today C/o DOE getting around, worse today C/o nausea the last 2-3 days here as well  Vitals:   04/29/23 0552 04/29/23 1251 04/29/23 1934 04/30/23 0408  BP:  124/62 (!) 150/75 (!) 164/85  Pulse:  67 71 78  Resp:  18 18 18   Temp:  98.1 F (36.7 C) 97.8 F (36.6 C) 98.1 F (36.7 C)  TempSrc:  Oral Oral   SpO2: 98% 98% 95% 93%  Weight:      Height:        Exam: Gen alert, no distress, no ^wob No jvd or bruits Chest occ rales at bases RRR no MRG Abd soft ntnd no mass or ascites +bs Ext no LE or UE edema Neuro is alert, Ox 3 , nf      Renal-related home meds: Lasix  20 mg daily Valsartan -HCTZ 320-25mg  once daily Hydralazine  100 twice daily Others: Synthroid , PPI, Trelegy Ellipta , Cymbalta , statin, gabapentin , aspirin, prednisone    Date                             Creat               eGFR (ml/min) 2015- 2022                  1.05- 1.40 2023                            1.16- 1.52 2024                            1.03- 1.85        38- >60             Jan 2025                     1.23- 1.29        59- >60 Mar 2025                     1.81                 39         04/27/23                        5.10, 4.80 04/28/23                        4.69  12 4/19    4.74   12   UA 4/17 - mod Hb, >50 rbc, 0-5 wbc/ epi, prot neg UNa - 109 Renal US  - 9.5/ 12.0 cm kidneys w/o hydro  Urine sediment (04/29/23, undersigned) --> sheets of non-dysmorphic single rbc's, no granular or cellular casts, no wbc's or bacteria     Assessment/ Plan: AKI on CKD 3a - b/l creat 1.3- 1.8, from early 2025, eGFR 39-59 ml/min. Creat here was 5.1 on admission in the setting of swelling of feet/ hands leading to prescription of low-dose Lasix  within the last few weeks. Unclear cause of AKI. Not responding to IVF"s and now signs of ^vol, will dc IVF's and give IV lasix  x 1. Sediment argues against GN,  could be ATN. AIN also a possibility. New onset nausea  but no indication for dialysis. Will send off serologies, cont supportive care for now.  Hypertension: holding ARB, on hydralazine . Added Coreg  12.5 bid. BP's okay, slightly up Volume: as above, DOE today. Dc'd IVFs, once dose IV lasix . Not requiring O2.  Hematuria: monomorphic rbc's noted in urine sediment. No casts.  History of iron  deficiency anemia History of COPD History of lung cancer 2009    Larry Poag MD  [facility] 04/30/2023, 12:30 PM  Recent Labs  Lab 04/29/23 0500 04/30/23 0402  HGB 9.5* 9.5*  ALBUMIN  2.7* 2.8*  CALCIUM  8.3* 8.7*  PHOS 4.0 3.7  CREATININE 4.74* 4.62*  K 4.0 3.9   Recent Labs  Lab 04/27/23 1017 04/27/23 1018  IRON  51  --   TIBC 239*  --   FERRITIN  --  154   Inpatient medications:  atorvastatin   40 mg Oral Daily   budeson-glycopyrrolate -formoterol   2 puff Inhalation BID   carvedilol   6.25 mg Oral BID WC   DULoxetine   30 mg Oral Daily   furosemide   80 mg Intravenous Once   heparin   5,000 Units Subcutaneous Q8H   hydrALAZINE   100 mg Oral BID   levothyroxine   175 mcg Oral Q0600   pantoprazole   40 mg Oral Daily     acetaminophen  **OR** acetaminophen , ondansetron 

## 2023-04-30 NOTE — Progress Notes (Signed)
 Notified MD pt is hesitant to take lasix  said "that's the reason I'm in here, a reaction? to lasix " MD stated that patient's brother would explain this to the patient

## 2023-05-01 DIAGNOSIS — N179 Acute kidney failure, unspecified: Secondary | ICD-10-CM | POA: Diagnosis not present

## 2023-05-01 LAB — CBC
HCT: 26.9 % — ABNORMAL LOW (ref 39.0–52.0)
Hemoglobin: 8.8 g/dL — ABNORMAL LOW (ref 13.0–17.0)
MCH: 29.2 pg (ref 26.0–34.0)
MCHC: 32.7 g/dL (ref 30.0–36.0)
MCV: 89.4 fL (ref 80.0–100.0)
Platelets: 219 10*3/uL (ref 150–400)
RBC: 3.01 MIL/uL — ABNORMAL LOW (ref 4.22–5.81)
RDW: 18.1 % — ABNORMAL HIGH (ref 11.5–15.5)
WBC: 4.3 10*3/uL (ref 4.0–10.5)
nRBC: 0 % (ref 0.0–0.2)

## 2023-05-01 LAB — BASIC METABOLIC PANEL WITH GFR
Anion gap: 8 (ref 5–15)
BUN: 53 mg/dL — ABNORMAL HIGH (ref 8–23)
CO2: 24 mmol/L (ref 22–32)
Calcium: 8.5 mg/dL — ABNORMAL LOW (ref 8.9–10.3)
Chloride: 100 mmol/L (ref 98–111)
Creatinine, Ser: 4.97 mg/dL — ABNORMAL HIGH (ref 0.61–1.24)
GFR, Estimated: 12 mL/min — ABNORMAL LOW (ref >60)
Glucose, Bld: 98 mg/dL (ref 70–99)
Potassium: 4.1 mmol/L (ref 3.5–5.1)
Sodium: 132 mmol/L — ABNORMAL LOW (ref 135–145)

## 2023-05-01 LAB — MAGNESIUM: Magnesium: 2.1 mg/dL (ref 1.7–2.4)

## 2023-05-01 MED ORDER — DARBEPOETIN ALFA 60 MCG/0.3ML IJ SOSY
60.0000 ug | PREFILLED_SYRINGE | INTRAMUSCULAR | Status: DC
  Administered 2023-05-01 – 2023-05-08 (×2): 60 ug via SUBCUTANEOUS
  Filled 2023-05-01 (×2): qty 0.3

## 2023-05-01 MED ORDER — VITAMIN D (ERGOCALCIFEROL) 1.25 MG (50000 UNIT) PO CAPS
50000.0000 [IU] | ORAL_CAPSULE | ORAL | Status: DC
  Administered 2023-05-01 – 2023-05-08 (×2): 50000 [IU] via ORAL
  Filled 2023-05-01 (×2): qty 1

## 2023-05-01 NOTE — Progress Notes (Addendum)
 [facility] NEPHROLOGY PROGRESS NOTE  Assessment/ Plan: Pt is a 73 y.o. yo male with past medical history significant for lung cancer s/p resection, hypertension, hypothyroidism, emphysema, anemia on erythropoietin  injection as outpatient, CKD 3 presented with AKI found in outpatient lab.  # AKI on CKD 3a - b/l creat 1.3- 1.8, from early 2025, eGFR 39-59 ml/min. Creat here was 5.1 on admission in the setting of swelling of feet/ hands leading to prescription of low-dose Lasix  within the last few weeks. Unclear cause of AKI. Not responding to IVF"s. Sediment argues against GN, could be ATN. AIN also a possibility. Sent off serologies, cont supportive care for now.  Nonoliguric, noticed around 900 cc of urine in urinal, no urgent need for dialysis.  # Hypertension: holding ARB, on hydralazine . Added Coreg  12.5 bid.  Monitor BP.  #Volume: as above, DOE today. Dc'd IVFs, received a dose of Lasix  on 4/20. Not requiring O2.   #Hematuria: monomorphic rbc's noted in urine sediment. No casts.   #History of iron  deficiency anemia: Iron  saturation 21%, ferritin 154.  Noted mild allergic reaction to iron .  Order Aranesp .  Monitor hemoglobin.  #History of COPD #History of lung cancer 2009  Subjective: Seen and examined.  Reports feeling much better.  Denies nausea, vomiting, dysgeusia, chest pain or shortness of breath.  Urine around 900 cc in the urinal.  No new event overnight. Objective Vital signs in last 24 hours: Vitals:   04/30/23 2128 05/01/23 0500 05/01/23 0512 05/01/23 0955  BP:   (!) 145/78   Pulse: 75  80 88  Resp: 18  18 18   Temp:   98.4 F (36.9 C)   TempSrc:   Oral   SpO2: 93%  (!) 89% 92%  Weight:  81.7 kg    Height:       Weight change:   Intake/Output Summary (Last 24 hours) at 05/01/2023 1028 Last data filed at 05/01/2023 0730 Gross per 24 hour  Intake 480 ml  Output 300 ml  Net 180 ml       Labs: RENAL PANEL Recent Labs  Lab 04/27/23 1017  04/27/23 1734 04/28/23 0411 04/29/23 0500 04/30/23 0402 05/01/23 0420  NA 137 134* 133* 134* 133* 132*  K 3.9 3.9 3.6 4.0 3.9 4.1  CL 102 102 102 104 102 100  CO2 24 23 19* 23 20* 24  GLUCOSE 108* 106* 92 100* 99 98  BUN 58* 59* 55* 53* 45* 53*  CREATININE 5.10* 4.80* 4.69* 4.74* 4.62* 4.97*  CALCIUM  8.8* 8.4* 8.0* 8.3* 8.7* 8.5*  MG  --   --   --  1.3* 2.2 2.1  PHOS  --   --  4.3 4.0 3.7  --   ALBUMIN  3.4* 3.2* 2.6* 2.7* 2.8*  --     Liver Function Tests: Recent Labs  Lab 04/27/23 1017 04/27/23 1734 04/28/23 0411 04/29/23 0500 04/30/23 0402  AST 12* 15  --   --   --   ALT 7 10  --   --   --   ALKPHOS 69 66  --   --   --   BILITOT 0.6 0.7  --   --   --   PROT 7.0 7.4  --   --   --   ALBUMIN  3.4* 3.2* 2.6* 2.7* 2.8*   No results for input(s): "LIPASE", "AMYLASE" in the last 168 hours. No results for input(s): "AMMONIA" in the last 168 hours. CBC: Recent Labs    08/05/22 0839 08/05/22 0840  11/24/22 0928 11/30/22 0909 11/30/22 0910 12/05/22 1104 12/12/22 1003 12/19/22 1046 01/16/23 0938 01/16/23 0939 02/08/23 0952 02/08/23 0953 03/29/23 1017 03/29/23 1018 04/27/23 1017 04/27/23 1018 04/27/23 1734 04/28/23 0411 04/29/23 0500 04/30/23 0402 05/01/23 0420  HGB  --    < > 8.8* 7.7*  --    < > 9.2*   < >  --  9.8* 10.8*  --  11.8*  --  10.3*  --  10.3* 9.3* 9.5* 9.5* 8.8*  MCV  --    < > 84 83.8  --    < > 83.2   < >  --  85.8 86.9  --  85.5  --  86.5  --  87.9 90.5 87.7 89.7 89.4  VITAMINB12 719  --  747  --   --   --   --   --   --   --   --   --   --   --   --   --   --   --   --   --   --   FOLATE  --   --  14.9  --   --   --   --   --   --   --   --   --   --   --   --   --   --   --   --   --   --   FERRITIN 20*  --  228  --  155  --   --   --   --   --   --  94  --  122  --  154  --   --   --   --   --   TIBC  --    < > 247* 276  --   --   --   --   --  262 305  --  255  --  239*  --   --   --   --   --   --   IRON   --    < > 21* 12*  --   --   --   --    --  46 62  --  86  --  51  --   --   --   --   --   --   RETICCTPCT  --    < > 1.7 1.8  --   --  2.2  --  1.7  --   --  1.1  --  0.7 0.9  --   --   --   --   --   --    < > = values in this interval not displayed.    Cardiac Enzymes: No results for input(s): "CKTOTAL", "CKMB", "CKMBINDEX", "TROPONINI" in the last 168 hours. CBG: No results for input(s): "GLUCAP" in the last 168 hours.  Iron  Studies: No results for input(s): "IRON ", "TIBC", "TRANSFERRIN", "FERRITIN" in the last 72 hours. Studies/Results: No results found.  Medications: Infusions:   Scheduled Medications:  atorvastatin   40 mg Oral Daily   budeson-glycopyrrolate -formoterol   2 puff Inhalation BID   carvedilol   6.25 mg Oral BID WC   DULoxetine   30 mg Oral Daily   heparin   5,000 Units Subcutaneous Q8H   hydrALAZINE   100 mg Oral BID   levothyroxine   175 mcg Oral Q0600   pantoprazole   40 mg Oral Daily   Vitamin D  (Ergocalciferol )  50,000 Units Oral Q7 days    have reviewed scheduled and prn medications.  Physical Exam: General:NAD, comfortable Heart:RRR, s1s2 nl Lungs:clear b/l, no crackle Abdomen:soft, Non-tender, non-distended Extremities:No edema Neurology: Alert, awake and following commands.  Rebecca Cairns Prasad Vittoria Noreen 05/01/2023,10:28 AM  LOS: 3 days

## 2023-05-01 NOTE — Progress Notes (Signed)
 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   Assessment and Plan:  # AKI on CKD 3A # Hematuria Patient presenting to the ED with severe AKI after starting lasix  20mg  daily about 10 days ago for LE edema.  Of note, the patient had also been on Diovan -HCT. His baseline Cr appears to be around 1.2. Presented with of 4.8 UA showed some hemoglobin but otherwise unremarkable.  Renal ultrasound: no hydronephrosis.  Most likely prerenal AKI with ATN. Patient has received IV fluids, but creatinine has not improved. He continues to have good urine output. Nephrology following.  Input appreciated. - Dced lasix .  - Holding home Diovan -HCT - IV fluids discontinued - Monitor urine output.  - Avoid nephrotoxins. - Daily BMP 4/20 DOE today, DC'd IV fluid and Lasix  80 mg IV x1 dose given as per nephro 4/21 creatinine 4.97 still elevated  # Hypomagnesemia, mag repleted.  Resolved Monitor electrolytes and replete as needed  Chronic bilateral LE edema -noted in outpatient setting, suspect this is chronic venous insufficiency -last ECHO in 11/2022 with LVEF 60-65%, normal LV function, no WMA, normal diastolic parameters -was started on lasix , which led to intravascular depletion. -stopped lasix  -knee-high compression stockings, leg elevation 4/18 TTE LVEF 65 to 70%, no WMA mild LV hypertrophy,  HTN Patient on valsartan -hydrochlorothiazide  320-25mg  daily and hydralazine  100mg  BID at home. - Holding home valsartan -hydrochlorothiazide  given AKI. -resume home hydralazine  100mg  BID - Patient has been started on  Coreg .    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 Maria Shiner, in the outpatient setting   OSA -uses CPAP at bedtime at home -resume home CPAP at bedtime   Chronic pain syndrome -chronic lumbar pain -resume home cymbalta  30mg  daily -holding home gabapentin  given AKI   Hypothyroidism -resume home synthroid  200mcg daily   GERD  -resume home protonix   Nausea Complaining of persistent nausea for past 3 weeks, denied any vomiting Moving bowels, no constipation Continue Zofran  IV as needed 4/20 use Reglan  as needed if refractory nausea or vomiting  Vitamin D  insufficiency: started vitamin D  50,000 units p.o. weekly, follow with PCP to repeat vitamin D  level after 3 to 6 months.       Subjective: No significant events overnight, denies any nausea today, overall feels better, no shortness of breath, no chest pain or palpitation, no nasal complaints.    Physical Exam: Vitals:   05/01/23 0500 05/01/23 0512 05/01/23 0955 05/01/23 1125  BP:  (!) 145/78  117/70  Pulse:  80 88 71  Resp:  18 18 16   Temp:  98.4 F (36.9 C)  97.7 F (36.5 C)  TempSrc:  Oral  Oral  SpO2:  (!) 89% 92%  95%  Weight: 81.7 kg     Height:         General: Alert, oriented X3  Eyes: Pupils equal, reactive  Oral cavity: moist mucous membranes  Head: Atraumatic, normocephalic  Neck: supple  Chest: clear to auscultation.  Few bibasilar crackles, no wheezes CVS: S1,S2 RRR. No murmurs  Abd: No distention, soft, non-tender. No masses palpable   Extr: No pedal edema MSK: No joint deformities or swelling  Neurological: Grossly intact.     Data Reviewed:      Latest Ref Rng & Units 05/01/2023    4:20 AM 04/30/2023    4:02 AM 04/29/2023    5:00 AM  CBC  WBC 4.0 - 10.5 K/uL 4.3  4.4  3.8   Hemoglobin 13.0 - 17.0 g/dL 8.8  9.5  9.5   Hematocrit 39.0 - 52.0 % 26.9  29.6  28.5   Platelets 150 - 400 K/uL 219  137  199       Latest Ref Rng & Units 05/01/2023    4:20 AM 04/30/2023    4:02 AM 04/29/2023    5:00 AM  BMP  Glucose 70 - 99 mg/dL 98  99  409   BUN 8 - 23 mg/dL 53  45  53   Creatinine 0.61 - 1.24 mg/dL 8.11  9.14  7.82   Sodium 135 - 145 mmol/L 132  133  134   Potassium 3.5 - 5.1 mmol/L 4.1  3.9  4.0   Chloride 98 - 111 mmol/L 100  102  104   CO2 22 - 32 mmol/L 24  20  23    Calcium  8.9 - 10.3 mg/dL 8.5  8.7  8.3      Family Communication: n/a  Disposition: Status is: Inpatient   Planned Discharge Destination: Home  DVT ppx: SQ heparin      Time spent: 40 minutes  Author: Althia Atlas, MD 05/01/2023 3:45 PM  For on call review [email or web address].

## 2023-05-02 DIAGNOSIS — N179 Acute kidney failure, unspecified: Secondary | ICD-10-CM | POA: Diagnosis not present

## 2023-05-02 LAB — CBC
HCT: 28.3 % — ABNORMAL LOW (ref 39.0–52.0)
Hemoglobin: 9.3 g/dL — ABNORMAL LOW (ref 13.0–17.0)
MCH: 29.3 pg (ref 26.0–34.0)
MCHC: 32.9 g/dL (ref 30.0–36.0)
MCV: 89.3 fL (ref 80.0–100.0)
Platelets: 166 10*3/uL (ref 150–400)
RBC: 3.17 MIL/uL — ABNORMAL LOW (ref 4.22–5.81)
RDW: 18 % — ABNORMAL HIGH (ref 11.5–15.5)
WBC: 5.1 10*3/uL (ref 4.0–10.5)
nRBC: 0 % (ref 0.0–0.2)

## 2023-05-02 LAB — PROTEIN ELECTROPHORESIS, SERUM
A/G Ratio: 0.8 (ref 0.7–1.7)
Albumin ELP: 2.7 g/dL — ABNORMAL LOW (ref 2.9–4.4)
Alpha-1-Globulin: 0.2 g/dL (ref 0.0–0.4)
Alpha-2-Globulin: 0.6 g/dL (ref 0.4–1.0)
Beta Globulin: 1 g/dL (ref 0.7–1.3)
Gamma Globulin: 1.5 g/dL (ref 0.4–1.8)
Globulin, Total: 3.3 g/dL (ref 2.2–3.9)
Total Protein ELP: 6 g/dL (ref 6.0–8.5)

## 2023-05-02 LAB — KAPPA/LAMBDA LIGHT CHAINS
Kappa free light chain: 121.9 mg/L — ABNORMAL HIGH (ref 3.3–19.4)
Kappa, lambda light chain ratio: 0.93 (ref 0.26–1.65)
Lambda free light chains: 131 mg/L — ABNORMAL HIGH (ref 5.7–26.3)

## 2023-05-02 LAB — BASIC METABOLIC PANEL WITH GFR
Anion gap: 10 (ref 5–15)
BUN: 59 mg/dL — ABNORMAL HIGH (ref 8–23)
CO2: 21 mmol/L — ABNORMAL LOW (ref 22–32)
Calcium: 8.3 mg/dL — ABNORMAL LOW (ref 8.9–10.3)
Chloride: 96 mmol/L — ABNORMAL LOW (ref 98–111)
Creatinine, Ser: 5.63 mg/dL — ABNORMAL HIGH (ref 0.61–1.24)
GFR, Estimated: 10 mL/min — ABNORMAL LOW (ref >60)
Glucose, Bld: 96 mg/dL (ref 70–99)
Potassium: 4.1 mmol/L (ref 3.5–5.1)
Sodium: 127 mmol/L — ABNORMAL LOW (ref 135–145)

## 2023-05-02 LAB — MAGNESIUM: Magnesium: 2 mg/dL (ref 1.7–2.4)

## 2023-05-02 LAB — GLOMERULAR BASEMENT MEMBRANE ANTIBODIES: GBM Ab: <0.2 U (ref 0.0–0.9)

## 2023-05-02 LAB — PROTIME-INR
INR: 1.1 (ref 0.8–1.2)
Prothrombin Time: 14.6 s (ref 11.4–15.2)

## 2023-05-02 LAB — C4 COMPLEMENT: Complement C4, Body Fluid: 18 mg/dL (ref 12–38)

## 2023-05-02 LAB — ALBUMIN: Albumin: 2.8 g/dL — ABNORMAL LOW (ref 3.5–5.0)

## 2023-05-02 LAB — ANTISTREPTOLYSIN O TITER: ASO: 74 [IU]/mL (ref 0.0–200.0)

## 2023-05-02 LAB — C3 COMPLEMENT: C3 Complement: 84 mg/dL (ref 82–167)

## 2023-05-02 MED ORDER — ALBUMIN HUMAN 25 % IV SOLN
25.0000 g | Freq: Four times a day (QID) | INTRAVENOUS | Status: AC
Start: 2023-05-02 — End: 2023-05-02
  Administered 2023-05-02 (×2): 25 g via INTRAVENOUS
  Filled 2023-05-02 (×2): qty 100

## 2023-05-02 MED ORDER — GUAIFENESIN-DM 100-10 MG/5ML PO SYRP
5.0000 mL | ORAL_SOLUTION | ORAL | Status: DC | PRN
  Administered 2023-05-02 – 2023-05-09 (×11): 5 mL via ORAL
  Filled 2023-05-02 (×11): qty 5

## 2023-05-02 NOTE — Plan of Care (Signed)

## 2023-05-02 NOTE — Progress Notes (Signed)
 [facility] NEPHROLOGY PROGRESS NOTE  Assessment/ Plan: Pt is a 73 y.o. yo male with past medical history significant for lung cancer s/p resection, hypertension, hypothyroidism, emphysema, anemia on erythropoietin  injection as outpatient, CKD 3 presented with AKI found in outpatient lab.  # AKI on CKD 3a - b/l creat 1.3- 1.8, from early 2025, eGFR 39-59 ml/min. Creat here was 5.1 on admission in the setting of swelling of feet/ hands leading to prescription of low-dose Lasix  within the last few weeks. Unclear cause of AKI. Not responding to IVF"s.  ATN vs AIN also a possibility.  Sent off serologies; hep B, hep C, negative.  ASO, C3, C4, kappa lambda ratio unremarkable.  Pending anti-GBM, ANA, ANCA.   Worsening renal labs today.  Bedside bladder scan with no urine retention.  I will order 2 dose of IV albumin .  No signs or symptoms of uremia. I have discussed with the patient and called his brother who is a retired Education officer, community and reviewed at length.  I discussed about doing kidney biopsy in order to diagnose AKI.  The risk of biopsy including bleeding, loss of kidney function etc. reviewed.  Not on anticoagulation except heparin  subcu which can be discontinued.  I am going to consult IR today.  Also discussed about possible need for dialysis if no improvement in renal function.  Repeat urine studies.  # Hypertension: holding ARB, on hydralazine . Added Coreg  12.5 bid.  Monitor BP.  #Volume: as above, DOE today. Dc'd IVFs, received a dose of Lasix  on 4/20. Not requiring O2.   #Hematuria: monomorphic rbc's noted in urine sediment. No casts.   #History of iron  deficiency anemia: Iron  saturation 21%, ferritin 154.  Noted mild allergic reaction to iron .  Order Aranesp .  Monitor hemoglobin.  #History of COPD #History of lung cancer 2009  Subjective: Seen and examined.  Urine output is not measured.  She denies nausea, vomiting, chest pain, shortness of breath.  Frustrated about being in  the hospital for a long time without final diagnosis.  I spoke with his brother over the phone as per his request. Objective Vital signs in last 24 hours: Vitals:   05/01/23 1125 05/01/23 1924 05/01/23 1934 05/02/23 0522  BP: 117/70  122/63 (!) 144/66  Pulse: 71  71 70  Resp: 16  19 19   Temp: 97.7 F (36.5 C)  (!) 97.4 F (36.3 C) 98.2 F (36.8 C)  TempSrc: Oral  Oral Oral  SpO2: 95% 92% 93% 93%  Weight:    82.4 kg  Height:       Weight change: 0.707 kg  Intake/Output Summary (Last 24 hours) at 05/02/2023 0916 Last data filed at 05/01/2023 1800 Gross per 24 hour  Intake 240 ml  Output --  Net 240 ml       Labs: RENAL PANEL Recent Labs  Lab 04/27/23 1734 04/28/23 0411 04/29/23 0500 04/30/23 0402 05/01/23 0420 05/02/23 0427  NA 134* 133* 134* 133* 132* 127*  K 3.9 3.6 4.0 3.9 4.1 4.1  CL 102 102 104 102 100 96*  CO2 23 19* 23 20* 24 21*  GLUCOSE 106* 92 100* 99 98 96  BUN 59* 55* 53* 45* 53* 59*  CREATININE 4.80* 4.69* 4.74* 4.62* 4.97* 5.63*  CALCIUM  8.4* 8.0* 8.3* 8.7* 8.5* 8.3*  MG  --   --  1.3* 2.2 2.1 2.0  PHOS  --  4.3 4.0 3.7  --   --   ALBUMIN  3.2* 2.6* 2.7* 2.8*  --  2.8*  Liver Function Tests: Recent Labs  Lab 04/27/23 1017 04/27/23 1734 04/28/23 0411 04/29/23 0500 04/30/23 0402 05/02/23 0427  AST 12* 15  --   --   --   --   ALT 7 10  --   --   --   --   ALKPHOS 69 66  --   --   --   --   BILITOT 0.6 0.7  --   --   --   --   PROT 7.0 7.4  --   --   --   --   ALBUMIN  3.4* 3.2*   < > 2.7* 2.8* 2.8*   < > = values in this interval not displayed.   No results for input(s): "LIPASE", "AMYLASE" in the last 168 hours. No results for input(s): "AMMONIA" in the last 168 hours. CBC: Recent Labs    08/05/22 0839 08/05/22 0840 11/24/22 0928 11/30/22 0909 11/30/22 0910 12/05/22 1104 12/12/22 1003 12/19/22 1046 01/16/23 0938 01/16/23 0939 02/08/23 0952 02/08/23 0953 03/29/23 1017 03/29/23 1018 04/27/23 1017 04/27/23 1018  04/27/23 1734 04/28/23 0411 04/29/23 0500 04/30/23 0402 05/01/23 0420 05/02/23 0427  HGB  --    < > 8.8* 7.7*  --    < > 9.2*   < >  --  9.8* 10.8*  --  11.8*  --  10.3*  --    < > 9.3* 9.5* 9.5* 8.8* 9.3*  MCV  --    < > 84 83.8  --    < > 83.2   < >  --  85.8 86.9  --  85.5  --  86.5  --    < > 90.5 87.7 89.7 89.4 89.3  VITAMINB12 719  --  747  --   --   --   --   --   --   --   --   --   --   --   --   --   --   --   --   --   --   --   FOLATE  --   --  14.9  --   --   --   --   --   --   --   --   --   --   --   --   --   --   --   --   --   --   --   FERRITIN 20*  --  228  --  155  --   --   --   --   --   --  94  --  122  --  154  --   --   --   --   --   --   TIBC  --    < > 247* 276  --   --   --   --   --  262 305  --  255  --  239*  --   --   --   --   --   --   --   IRON   --    < > 21* 12*  --   --   --   --   --  46 62  --  86  --  51  --   --   --   --   --   --   --   RETICCTPCT  --    < >  1.7 1.8  --   --  2.2  --  1.7  --   --  1.1  --  0.7 0.9  --   --   --   --   --   --   --    < > = values in this interval not displayed.    Cardiac Enzymes: No results for input(s): "CKTOTAL", "CKMB", "CKMBINDEX", "TROPONINI" in the last 168 hours. CBG: No results for input(s): "GLUCAP" in the last 168 hours.  Iron  Studies: No results for input(s): "IRON ", "TIBC", "TRANSFERRIN", "FERRITIN" in the last 72 hours. Studies/Results: No results found.  Medications: Infusions:   Scheduled Medications:  atorvastatin   40 mg Oral Daily   budeson-glycopyrrolate -formoterol   2 puff Inhalation BID   carvedilol   6.25 mg Oral BID WC   darbepoetin (ARANESP ) injection - NON-DIALYSIS  60 mcg Subcutaneous Q Mon-1800   DULoxetine   30 mg Oral Daily   heparin   5,000 Units Subcutaneous Q8H   hydrALAZINE   100 mg Oral BID   levothyroxine   175 mcg Oral Q0600   pantoprazole   40 mg Oral Daily   Vitamin D  (Ergocalciferol )  50,000 Units Oral Q7 days    have reviewed scheduled and prn  medications.  Physical Exam: General:NAD, comfortable Heart:RRR, s1s2 nl Lungs:clear b/l, no crackle Abdomen:soft, Non-tender, non-distended Extremities:No edema Neurology: Alert, awake and following commands.  Lief Palmatier Prasad Trudie Cervantes 05/02/2023,9:16 AM  LOS: 4 days

## 2023-05-02 NOTE — Progress Notes (Signed)
 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   Assessment and Plan:  # AKI on CKD 3A # Hematuria Patient presenting to the ED with severe AKI after starting lasix  20mg  daily about 10 days ago for LE edema.  Of note, the patient had also been on Diovan -HCT. His baseline Cr appears to be around 1.2. Presented with of 4.8 UA showed some hemoglobin but otherwise unremarkable.  Renal ultrasound: no hydronephrosis.  Most likely prerenal AKI with ATN. Patient has received IV fluids, but creatinine has not improved. He continues to have good urine output. Nephrology following.  Input appreciated. - Dced lasix .  - Holding home Diovan -HCT - IV fluids discontinued - Monitor urine output.  - Avoid nephrotoxins. - Daily BMP 4/20 DOE today, DC'd IV fluid and Lasix  80 mg IV x1 dose given as per nephro 4/22 creatinine 5.63 still elevated, nephro recommended renal biopsy which will be done tomorrow a.m. by IR   # Hypomagnesemia, mag repleted.  Resolved Monitor electrolytes and replete as needed  Chronic bilateral LE edema -noted in outpatient setting, suspect this is chronic venous insufficiency -last ECHO in 11/2022 with LVEF 60-65%, normal LV function, no WMA, normal diastolic parameters -was started on lasix , which led to intravascular depletion. -stopped lasix  -knee-high compression stockings, leg elevation 4/18 TTE LVEF 65 to 70%, no WMA mild LV hypertrophy,  HTN Patient on valsartan -hydrochlorothiazide  320-25mg  daily and hydralazine  100mg  BID at home. - Holding home valsartan -hydrochlorothiazide  given  AKI. -resume home hydralazine  100mg  BID - Patient has been started on Coreg .    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 Maria Shiner, in the outpatient setting   OSA -uses CPAP at bedtime at home -resume home CPAP at bedtime   Chronic pain syndrome -chronic lumbar pain -resume home cymbalta  30mg  daily -holding home gabapentin  given AKI   Hypothyroidism -resume home synthroid  200mcg daily   GERD  -resume home protonix   Nausea Complaining of persistent nausea for past 3 weeks, denied any vomiting Moving bowels, no constipation Continue Zofran  IV as needed 4/20 use Reglan  as needed if refractory nausea or vomiting  Vitamin D  insufficiency: started vitamin D  50,000 units p.o. weekly, follow with PCP to repeat vitamin D  level after 3 to 6 months.      Subjective: No significant events overnight, complaining of some back pain and mild shortness of breath, denied any nausea or abdominal pain.  Passing BM.    Physical Exam: Vitals:   05/01/23 1125 05/01/23 1924 05/01/23 1934 05/02/23 0522  BP: 117/70  122/63 (!) 144/66  Pulse: 71  71 70  Resp: 16  19 19   Temp: 97.7 F (36.5 C)  (!) 97.4 F (36.3 C)  98.2 F (36.8 C)  TempSrc: Oral  Oral Oral  SpO2: 95% 92% 93% 93%  Weight:    82.4 kg  Height:         General: Alert, oriented X3  Eyes: Pupils equal, reactive  Oral cavity: moist mucous membranes  Head: Atraumatic, normocephalic  Neck: supple  Chest: clear to auscultation.  Few bibasilar crackles, no  wheezes CVS: S1,S2 RRR. No murmurs  Abd: No distention, soft, non-tender. No masses palpable  Extr: No pedal edema MSK: No joint deformities or swelling  Neurological: Grossly intact.     Data Reviewed:      Latest Ref Rng & Units 05/02/2023    4:27 AM 05/01/2023    4:20 AM 04/30/2023    4:02 AM  CBC  WBC 4.0 - 10.5 K/uL 5.1  4.3  4.4   Hemoglobin 13.0 - 17.0 g/dL 9.3  8.8  9.5   Hematocrit 39.0 - 52.0 % 28.3  26.9  29.6   Platelets 150 - 400 K/uL 166  219  137       Latest Ref Rng & Units 05/02/2023    4:27 AM 05/01/2023    4:20 AM 04/30/2023    4:02 AM  BMP  Glucose 70 - 99 mg/dL 96  98  99   BUN 8 - 23 mg/dL 59  53  45   Creatinine 0.61 - 1.24 mg/dL 8.29  5.62  1.30   Sodium 135 - 145 mmol/L 127  132  133   Potassium 3.5 - 5.1 mmol/L 4.1  4.1  3.9   Chloride 98 - 111 mmol/L 96  100  102   CO2 22 - 32 mmol/L 21  24  20    Calcium  8.9 - 10.3 mg/dL 8.3  8.5  8.7      Family Communication: n/a  Disposition: Status is: Inpatient   Planned Discharge Destination: Home  DVT ppx: SQ heparin      Time spent: 40 minutes  Author: Althia Atlas, MD 05/02/2023 11:16 AM  For on call review [email or web address].

## 2023-05-02 NOTE — Consult Note (Addendum)
 Chief Complaint: Worsening renal function of uncertain etiology; referred for image guided random core renal biopsy  Referring Provider(s): Bhandari,D  Supervising Physician: Creasie Doctor  Patient Status: [facility] - In-pt  History of Present Illness: Walter Wilkerson is a 73 y.o. male ex smoker with past medical history significant for lung cancer 2009, status post resection/in remission, chronic kidney disease stage IIIa, hypertension, hypothyroidism, sleep apnea, GERD, emphysema, iron  deficiency anemia and chronic pain syndrome who was recently admitted to [facility] for elevated creatinine noted in outpatient labs.  Patient has unclear cause of AKI and is not responding to IV fluids.  Latest creatinine 5.63 and rising.  WBC 5.1, hemoglobin 9.3, platelets normal, PT/INR nl.   Patient received subcu heparin  this morning at 0600.  Request now received from nephrology for image guided random core renal biopsy for further evaluation.  Renal ultrasound on 4/17 revealed no hydronephrosis, mildly echogenic kidneys bilaterally and bilateral renal cysts.  Patient known to IR team from bone marrow biopsy on 12/09/2022.   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 Neil Balls   VASECTOMY      Allergies: Pregabalin, Lisinopril, Merbromin, Thimerosal (thiomersal), and Ferrous sulfate   Medications: Prior to Admission medications   Medication Sig Start Date End Date Taking? Authorizing Provider  albuterol  (VENTOLIN  HFA) 108 (90 Base) MCG/ACT inhaler Inhale 2 puffs into the lungs every 4 (four) hours as needed. Patient taking differently: Inhale 2 puffs into the lungs every 4 (four) hours as needed  for shortness of breath or wheezing. 12/08/20  Yes Breeback, Jade L, PA-C  aspirin EC 81 MG tablet Take 81 mg by mouth daily.   Yes [provider]  atorvastatin  (LIPITOR) 40 MG tablet Take 1 tablet (40 mg total) by mouth daily. 11/29/22  Yes Breeback, Jade L, PA-C  benzonatate  (TESSALON ) 200 MG capsule Take 200 mg by mouth 3 (three) times daily as needed for cough.   Yes [provider]  clindamycin (CLEOCIN T) 1 % external solution Apply 1 Application topically 2 (two) times daily as needed (for scalp irritation).   Yes [provider]  clobetasol cream (TEMOVATE) 0.05 % Apply 1 Application topically 2 (two) times daily as needed (for scalp irritation).   Yes [provider]  DULoxetine  (CYMBALTA ) 30 MG capsule Take 1 capsule (30 mg total) by mouth daily. 12/13/22  Yes Breeback, Jade L, PA-C  Fluticasone -Umeclidin-Vilant (TRELEGY ELLIPTA ) 100-62.5-25 MCG/ACT AEPB Inhale 1 puff into the lungs daily in the afternoon. 04/05/22  Yes Sood, Vineet, MD  furosemide  (LASIX ) 20 MG tablet Take 1 tablet (20 mg total) by mouth daily. 04/18/23  Yes Breeback, Jade L, PA-C  gabapentin  (NEURONTIN ) 300 MG capsule TAKE 1-2 CAPSULES BY MOUTH UP TO THREE TIMES DAILY Patient taking differently: Take 300-600 mg by mouth 3 (three) times daily. 12/20/22  Yes Breeback, Jade L, PA-C  hydrALAZINE  (APRESOLINE ) 100 MG tablet Take 1 tablet (100 mg total) by mouth 2 (two) times daily. 04/18/23  Yes Breeback, Jade L, PA-C  levothyroxine  (SYNTHROID ) 175 MCG tablet Take 175 mcg by mouth daily before breakfast.   Yes [provider]  Multiple Vitamin (MULTIVITAMIN)  tablet Take 1 tablet by mouth daily with breakfast.   Yes [provider]  vitamin B-12 (CYANOCOBALAMIN ) 500 MCG tablet Take 500 mcg by mouth daily.   Yes [provider]  levothyroxine  (SYNTHROID ) 200 MCG tablet Take 1 tablet (200 mcg total) by mouth daily before breakfast. Patient not taking: Reported on 04/28/2023  12/19/22   Sharla Davis, PA-C  pantoprazole  (PROTONIX ) 40 MG tablet TAKE ONE TABLET BY MOUTH TWICE DAILY Patient not taking: Reported on 04/28/2023 03/20/23   Araceli Knight, PA-C  valsartan -hydrochlorothiazide  (DIOVAN -HCT) 320-25 MG tablet TAKE ONE TABLET BY MOUTH EVERY DAY 05/01/23   Breeback, Jade L, PA-C     Family History  Problem Relation Age of Onset   Heart attack Father    Hyperlipidemia Father    Hypertension Father    Heart disease Father    Diabetes Other        grandmother     Social History   Socioeconomic History   Marital status: Married    Spouse name: Walter Wilkerson   Number of children: 3   Years of education: 12   Highest education level: 12th grade  Occupational History   Occupation: [employer]    Comment: part time  Tobacco Use   Smoking status: Former    Current packs/day: 0.00    Average packs/day: 1 pack/day for 30.0 years (30.0 ttl pk-yrs)    Types: Cigarettes    Start date: 08/11/1974    Quit date: 08/10/2004    Years since quitting: 18.7   Smokeless tobacco: Never  Vaping Use   Vaping status: Never Used  Substance and Sexual Activity   Alcohol use: Yes    Alcohol/week: 21.0 standard drinks of alcohol    Types: 21 Shots of liquor per week    Comment: 1-2 shots every night   Drug use: No   Sexual activity: Not Currently    Partners: Female  Other Topics Concern   Not on file  Social History Narrative   Lives with his wife and son. 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   Social Drivers of Corporate investment banker Strain: Low Risk  (04/19/2023)   Overall Financial Resource Strain (CARDIA)    Difficulty of Paying Living Expenses: Not hard at all  Food Insecurity: No Food Insecurity (04/27/2023)   Hunger Vital Sign    Worried About Running Out of Food in the Last Year: Never true    Ran Out of Food in the Last Year: Never true  Transportation Needs: No Transportation Needs (04/27/2023)   PRAPARE - Scientist, research (physical sciences) (Medical): No    Lack of Transportation (Non-Medical): No  Physical Activity: Insufficiently Active (04/19/2023)   Exercise Vital Sign    Days of Exercise per Week: 4 days    Minutes of Exercise per Session: 20 min  Stress: No Stress Concern Present (04/19/2023)   [employer] of Occupational Health - Occupational Stress Questionnaire    Feeling of Stress : Not at all  Social Connections: Socially Integrated (04/27/2023)   Social Connection and Isolation Panel [NHANES]    Frequency of Communication with Friends and Family: Three times a week    Frequency of Social Gatherings with Friends and Family: Three times a week    Attends Religious Services: More than 4 times per year    Active Member of Clubs or Organizations: Yes    Attends Banker Meetings: More than 4 times per year  Marital Status: Married       Review of Systems : currently denies fever, headache, chest pain, abdominal/back pain, nausea, vomiting or bleeding.  He does have some dyspnea with exertion, occasional cough.  Vital Signs: BP (!) 144/66 (BP Location: Right Arm)   Pulse 70   Temp 98.2 F (36.8 C) (Oral)   Resp 19   Ht 5\' 8"  (1.727 m)   Wt 181 lb 10.5 oz (82.4 kg)   SpO2 93%   BMI 27.62 kg/m   Advance Care Plan: No documents on file  Physical Exam awake, alert.  Chest with distant breath sounds bilaterally.  Heart with regular rate and rhythm.  Abdomen soft, positive bowel sounds, nontender.  No lower extremity edema.  Imaging: ECHOCARDIOGRAM COMPLETE Result Date: 04/28/2023    ECHOCARDIOGRAM REPORT   Patient Name:   EMMANUELLE COXE Date of Exam: 04/28/2023 Medical Rec #:  [id number]      Height:       68.0 in Accession #:    [id number]     Weight:       178.6 lb Date of Birth:  12-23-1950      BSA:          1.948 m Patient Age:    72 years       BP:           151/79 mmHg Patient Gender: M              HR:           83 bpm. Exam Location:  Inpatient Procedure: 2D Echo, Cardiac  Doppler and Color Doppler (Both Spectral and Color            Flow Doppler were utilized during procedure). Indications:    CHF- Acute Diastolic I50.31  History:        Patient has prior history of Echocardiogram examinations, most                 recent 11/25/2022. Signs/Symptoms:Chest Pain and Shortness of                 Breath; Risk Factors:Hypertension.  Sonographer:    Terrilee Few RCS Referring Phys: [id number] Margo Shells [id number]  Sonographer Comments: Image acquisition challenging due to respiratory motion. IMPRESSIONS  1. Left ventricular ejection fraction, by estimation, is 65 to 70%. The left ventricle has normal function. The left ventricle has no regional wall motion abnormalities. There is mild left ventricular hypertrophy. Left ventricular diastolic parameters were normal.  2. Right ventricular systolic function is normal. The right ventricular size is normal.  3. The mitral valve is normal in structure. No evidence of mitral valve regurgitation.  4. The aortic valve is tricuspid. Aortic valve regurgitation is not visualized. FINDINGS  Left Ventricle: Left ventricular ejection fraction, by estimation, is 65 to 70%. The left ventricle has normal function. The left ventricle has no regional wall motion abnormalities. The left ventricular internal cavity size was normal in size. There is  mild left ventricular hypertrophy. Left ventricular diastolic parameters were normal. Right Ventricle: The right ventricular size is normal. Right vetricular wall thickness was not assessed. Right ventricular systolic function is normal. Left Atrium: Left atrial size was normal in size. Right Atrium: Right atrial size was normal in size. Pericardium: There is no evidence of pericardial effusion. Mitral Valve: The mitral valve is normal in structure. No evidence of mitral valve regurgitation. Tricuspid Valve: The tricuspid valve is normal in structure. Tricuspid valve regurgitation is trivial.  Aortic Valve: The aortic  valve is tricuspid. Aortic valve regurgitation is not visualized. Aortic valve peak gradient measures 13.2 mmHg. Pulmonic Valve: The pulmonic valve was not well visualized. Pulmonic valve regurgitation is not visualized. No evidence of pulmonic stenosis. Aorta: The aortic root is normal in size and structure. IAS/Shunts: No atrial level shunt detected by color flow Doppler.  LEFT VENTRICLE PLAX 2D LVIDd:         4.40 cm   Diastology LVIDs:         2.90 cm   LV e' medial:    9.25 cm/s LV PW:         1.20 cm   LV E/e' medial:  9.1 LV IVS:        0.90 cm   LV e' lateral:   10.80 cm/s LVOT diam:     2.00 cm   LV E/e' lateral: 7.8 LV SV:         72 LV SV Index:   37 LVOT Area:     3.14 cm  RIGHT VENTRICLE            IVC RV S prime:     8.92 cm/s  IVC diam: 2.00 cm TAPSE (M-mode): 2.0 cm LEFT ATRIUM             Index        RIGHT ATRIUM           Index LA diam:        4.30 cm 2.21 cm/m   RA Area:     20.40 cm LA Vol (A2C):   67.2 ml 34.50 ml/m  RA Volume:   52.70 ml  27.06 ml/m LA Vol (A4C):   63.3 ml 32.50 ml/m LA Biplane Vol: 66.1 ml 33.94 ml/m  AORTIC VALVE AV Area (Vmax): 1.88 cm AV Vmax:        182.00 cm/s AV Peak Grad:   13.2 mmHg LVOT Vmax:      108.67 cm/s LVOT Vmean:     76.433 cm/s LVOT VTI:       0.228 m  AORTA Ao Root diam: 3.80 cm MITRAL VALVE MV Area (PHT): 3.48 cm    SHUNTS MV Decel Time: 218 msec    Systemic VTI:  0.23 m MV E velocity: 84.20 cm/s  Systemic Diam: 2.00 cm MV A velocity: 73.80 cm/s MV E/A ratio:  1.14 Ola Berger MD Electronically signed by Ola Berger MD Signature Date/Time: 04/28/2023/3:11:27 PM    Final    US  RENAL Result Date: 04/27/2023 CLINICAL DATA:  Acute kidney injury EXAM: RENAL / URINARY TRACT ULTRASOUND COMPLETE COMPARISON:  PET CT 04/18/2022. FINDINGS: Right Kidney: Renal measurements: 9.5 x 5.4 x 5.3 cm = volume: 142 mL. Mildly echogenic. There is no hydronephrosis. There are 2 hypoechoic areas identified in the right kidney. One is in the inferior pole measuring 1.5 x  1.8 x 1.8 cm, likely cysts. The other is in the mid kidney measuring 2.2 x 1.5 x 1.8 cm, also likely cysts. These are both seen on prior CT. Left Kidney: Renal measurements: 12.2 x 7.6 x 6.2 cm = volume: 297 mL. There is a cyst in the mid left kidney measuring 1.4 x 1.5 x 1.7 cm. There is no hydronephrosis. Kidneys are mildly echogenic. Bladder: Appears normal for degree of bladder distention. Other: None. IMPRESSION: 1. No hydronephrosis. 2. Mildly echogenic kidneys bilaterally, nonspecific but can be seen in medical renal disease. 3. Bilateral renal cysts. Electronically Signed   By: Egbert Grass  Naaman Au M.D.   On: 04/27/2023 23:52    Labs:  CBC: Recent Labs    04/29/23 0500 04/30/23 0402 05/01/23 0420 05/02/23 0427  WBC 3.8* 4.4 4.3 5.1  HGB 9.5* 9.5* 8.8* 9.3*  HCT 28.5* 29.6* 26.9* 28.3*  PLT 199 137* 219 166    COAGS: No results for input(s): "INR", "APTT" in the last 8760 hours.  BMP: Recent Labs    04/29/23 0500 04/30/23 0402 05/01/23 0420 05/02/23 0427  NA 134* 133* 132* 127*  K 4.0 3.9 4.1 4.1  CL 104 102 100 96*  CO2 23 20* 24 21*  GLUCOSE 100* 99 98 96  BUN 53* 45* 53* 59*  CALCIUM  8.3* 8.7* 8.5* 8.3*  CREATININE 4.74* 4.62* 4.97* 5.63*  GFRNONAA 12* 13* 12* 10*    LIVER FUNCTION TESTS: Recent Labs    02/08/23 0952 03/29/23 1017 04/27/23 1017 04/27/23 1734 04/28/23 0411 04/29/23 0500 04/30/23 0402 05/02/23 0427  BILITOT 0.3 0.5 0.6 0.7  --   --   --   --   AST 15 13* 12* 15  --   --   --   --   ALT 10 8 7 10   --   --   --   --   ALKPHOS 93 94 69 66  --   --   --   --   PROT 7.1 7.6 7.0 7.4  --   --   --   --   ALBUMIN  3.6 3.4* 3.4* 3.2* 2.6* 2.7* 2.8* 2.8*    TUMOR MARKERS: Recent Labs    05/13/22 1439  CEA 3.42    Assessment and Plan: 73 y.o. male ex smoker with past medical history significant for lung cancer 2009, status post resection/in remission, chronic kidney disease stage IIIa, hypertension, hypothyroidism, sleep apnea, GERD, emphysema,  iron  deficiency anemia and chronic pain syndrome who was recently admitted to [facility] for elevated creatinine noted in outpatient labs.  Patient has unclear cause of AKI and is not responding to IV fluids.  Latest creatinine 5.63 and rising.  WBC 5.1, hemoglobin 9.3, platelets normal, PT/INR nl. Patient received subcu heparin  this morning at 0600.  Request now received from nephrology for image guided random core renal biopsy for further evaluation.  Renal ultrasound on 4/17 revealed no hydronephrosis, mildly echogenic kidneys bilaterally and bilateral renal cysts.  Patient known to IR team from bone marrow biopsy on 12/09/2022.  Case/imaging studies have been reviewed by Dr. Jinx Mourning.Risks and benefits of procedure was discussed with the patient including, but not limited to bleeding, infection, damage to adjacent structures or low yield requiring additional tests.  All of the questions were answered and there is agreement to proceed.  Consent signed and in chart.  Will plan procedure for 4/23  Thank you for allowing our service to participate in Walter Wilkerson 's care.  Electronically Signed: D. Honore Lux, PA-C   05/02/2023, 11:04 AM      I spent a total of 40 Minutes    in face to face in clinical consultation, greater than 50% of which was counseling/coordinating care for image guided random core renal biopsy

## 2023-05-03 ENCOUNTER — Inpatient Hospital Stay (HOSPITAL_COMMUNITY)

## 2023-05-03 DIAGNOSIS — N179 Acute kidney failure, unspecified: Secondary | ICD-10-CM | POA: Diagnosis not present

## 2023-05-03 DIAGNOSIS — J9601 Acute respiratory failure with hypoxia: Secondary | ICD-10-CM

## 2023-05-03 LAB — MAGNESIUM: Magnesium: 1.8 mg/dL (ref 1.7–2.4)

## 2023-05-03 LAB — BASIC METABOLIC PANEL WITH GFR
Anion gap: 12 (ref 5–15)
BUN: 58 mg/dL — ABNORMAL HIGH (ref 8–23)
CO2: 19 mmol/L — ABNORMAL LOW (ref 22–32)
Calcium: 8.8 mg/dL — ABNORMAL LOW (ref 8.9–10.3)
Chloride: 100 mmol/L (ref 98–111)
Creatinine, Ser: 5.89 mg/dL — ABNORMAL HIGH (ref 0.61–1.24)
GFR, Estimated: 9 mL/min — ABNORMAL LOW (ref >60)
Glucose, Bld: 94 mg/dL (ref 70–99)
Potassium: 4 mmol/L (ref 3.5–5.1)
Sodium: 131 mmol/L — ABNORMAL LOW (ref 135–145)

## 2023-05-03 LAB — CBC
HCT: 27.9 % — ABNORMAL LOW (ref 39.0–52.0)
Hemoglobin: 8.8 g/dL — ABNORMAL LOW (ref 13.0–17.0)
MCH: 28.6 pg (ref 26.0–34.0)
MCHC: 31.5 g/dL (ref 30.0–36.0)
MCV: 90.6 fL (ref 80.0–100.0)
Platelets: 205 10*3/uL (ref 150–400)
RBC: 3.08 MIL/uL — ABNORMAL LOW (ref 4.22–5.81)
RDW: 18 % — ABNORMAL HIGH (ref 11.5–15.5)
WBC: 5.2 10*3/uL (ref 4.0–10.5)
nRBC: 0 % (ref 0.0–0.2)

## 2023-05-03 LAB — URINALYSIS, ROUTINE W REFLEX MICROSCOPIC
Bacteria, UA: NONE SEEN
Bilirubin Urine: NEGATIVE
Glucose, UA: NEGATIVE mg/dL
Ketones, ur: NEGATIVE mg/dL
Leukocytes,Ua: NEGATIVE
Nitrite: NEGATIVE
Protein, ur: 30 mg/dL — AB
RBC / HPF: >50 RBC/hpf (ref 0–5)
Specific Gravity, Urine: 1.009 (ref 1.005–1.030)
pH: 6 (ref 5.0–8.0)

## 2023-05-03 LAB — PHOSPHORUS: Phosphorus: 3.9 mg/dL (ref 2.5–4.6)

## 2023-05-03 MED ORDER — GELATIN ABSORBABLE 12-7 MM EX MISC
CUTANEOUS | Status: AC
  Filled 2023-05-03: qty 1

## 2023-05-03 MED ORDER — MIDAZOLAM HCL 2 MG/2ML IJ SOLN
INTRAMUSCULAR | Status: AC
  Filled 2023-05-03: qty 4

## 2023-05-03 MED ORDER — FENTANYL CITRATE (PF) 100 MCG/2ML IJ SOLN
INTRAMUSCULAR | Status: AC | PRN
  Administered 2023-05-03: 50 ug via INTRAVENOUS

## 2023-05-03 MED ORDER — GELATIN ABSORBABLE 12-7 MM EX MISC
CUTANEOUS | Status: AC | PRN
  Administered 2023-05-03: 1 via TOPICAL

## 2023-05-03 MED ORDER — FENTANYL CITRATE (PF) 100 MCG/2ML IJ SOLN
INTRAMUSCULAR | Status: AC
  Filled 2023-05-03: qty 4

## 2023-05-03 MED ORDER — LIDOCAINE HCL 1 % IJ SOLN
INTRAMUSCULAR | Status: AC
  Filled 2023-05-03: qty 20

## 2023-05-03 MED ORDER — MIDAZOLAM HCL 2 MG/2ML IJ SOLN
INTRAMUSCULAR | Status: AC | PRN
  Administered 2023-05-03: 1 mg via INTRAVENOUS

## 2023-05-03 MED ORDER — IPRATROPIUM-ALBUTEROL 0.5-2.5 (3) MG/3ML IN SOLN
3.0000 mL | RESPIRATORY_TRACT | Status: DC | PRN
Start: 2023-05-03 — End: 2023-05-11
  Administered 2023-05-03: 3 mL via RESPIRATORY_TRACT
  Filled 2023-05-03: qty 3

## 2023-05-03 MED ORDER — LIDOCAINE HCL 1 % IJ SOLN
INTRAMUSCULAR | Status: AC | PRN
  Administered 2023-05-03: 10 mL via INTRADERMAL

## 2023-05-03 NOTE — Progress Notes (Signed)
 Walter Wilkerson  Assessment/ Plan: Pt is a 73 y.o. yo male with past medical history significant for lung cancer s/p resection, hypertension, hypothyroidism, emphysema, anemia on erythropoietin  injection as outpatient, CKD 3 presented with AKI found in outpatient lab.  # AKI on CKD 3a - b/l creat 1.3- 1.8, from early 2025, eGFR 39-59 ml/min. Creat here was 5.1 on admission in the setting of swelling of feet/ hands leading to prescription of low-dose Lasix  within the last few weeks. Unclear cause of AKI. Not responding to IVF"s.  ATN vs AIN also a possibility.  Sent off serologies; hep B, hep C negative.  ASO, C3, C4, kappa lambda ratio, anti-GBM unremarkable.  Pending  ANA, ANCA.   Worsening renal labs today.   No signs or symptoms of uremia. Plan for native kidney biopsy by IR today.  Patient understands the risk including bleeding, loss of kidney function etc. No urgent need for dialysis.  # Hypertension: holding ARB, on hydralazine . Added Coreg  12.5 bid.  Monitor BP.  #Volume: as above, DOE today. Dc'd IVFs, received a dose of Lasix  on 4/20.   #Hematuria: monomorphic rbc's noted in urine sediment. No casts.   #History of iron  deficiency anemia: Iron  saturation 21%, ferritin 154.  Noted mild allergic reaction to iron .  Order Aranesp .  Monitor hemoglobin.  #History of COPD #History of lung cancer 2009  Subjective: Seen and examined.  Urine output is not measuring however patient reports good urine output.  Denies nausea, vomiting, chest pain or shortness of breath.  Reports coughing spells and mild dyspnea overnight but better now.  Objective Vital signs in last 24 hours: Vitals:   05/02/23 1958 05/03/23 0018 05/03/23 0019 05/03/23 0445  BP: 124/70   (!) 147/75  Pulse: 69  84 73  Resp: 18   20  Temp: 98.4 F (36.9 C)   98.4 F (36.9 C)  TempSrc:      SpO2: 94% 93% 96% 97%  Weight:      Height:       Weight change:   Intake/Output Summary  (Last 24 hours) at 05/03/2023 0823 Last data filed at 05/02/2023 1838 Gross per 24 hour  Intake 236 ml  Output --  Net 236 ml       Labs: RENAL PANEL Recent Labs  Lab 04/27/23 1734 04/28/23 0411 04/29/23 0500 04/30/23 0402 05/01/23 0420 05/02/23 0427 05/03/23 0458  NA 134* 133* 134* 133* 132* 127* 131*  K 3.9 3.6 4.0 3.9 4.1 4.1 4.0  CL 102 102 104 102 100 96* 100  CO2 23 19* 23 20* 24 21* 19*  GLUCOSE 106* 92 100* 99 98 96 94  BUN 59* 55* 53* 45* 53* 59* 58*  CREATININE 4.80* 4.69* 4.74* 4.62* 4.97* 5.63* 5.89*  CALCIUM  8.4* 8.0* 8.3* 8.7* 8.5* 8.3* 8.8*  MG  --   --  1.3* 2.2 2.1 2.0 1.8  PHOS  --  4.3 4.0 3.7  --   --  3.9  ALBUMIN  3.2* 2.6* 2.7* 2.8*  --  2.8*  --     Liver Function Tests: Recent Labs  Lab 04/27/23 1017 04/27/23 1734 04/28/23 0411 04/29/23 0500 04/30/23 0402 05/02/23 0427  AST 12* 15  --   --   --   --   ALT 7 10  --   --   --   --   ALKPHOS 69 66  --   --   --   --   BILITOT 0.6 0.7  --   --   --   --  PROT 7.0 7.4  --   --   --   --   ALBUMIN  3.4* 3.2*   < > 2.7* 2.8* 2.8*   < > = values in this interval not displayed.   No results for input(s): "LIPASE", "AMYLASE" in the last 168 hours. No results for input(s): "AMMONIA" in the last 168 hours. CBC: Recent Labs    08/05/22 0839 08/05/22 0840 11/24/22 0928 11/30/22 0909 11/30/22 0910 12/05/22 1104 12/12/22 1003 12/19/22 1046 01/16/23 0938 01/16/23 0939 02/08/23 0952 02/08/23 0953 03/29/23 1017 03/29/23 1018 04/27/23 1017 04/27/23 1018 04/27/23 1734 04/29/23 0500 04/30/23 0402 05/01/23 0420 05/02/23 0427 05/03/23 0458  HGB  --    < > 8.8* 7.7*  --    < > 9.2*   < >  --  9.8* 10.8*  --  11.8*  --  10.3*  --    < > 9.5* 9.5* 8.8* 9.3* 8.8*  MCV  --    < > 84 83.8  --    < > 83.2   < >  --  85.8 86.9  --  85.5  --  86.5  --    < > 87.7 89.7 89.4 89.3 90.6  VITAMINB12 719  --  747  --   --   --   --   --   --   --   --   --   --   --   --   --   --   --   --   --   --    --   FOLATE  --   --  14.9  --   --   --   --   --   --   --   --   --   --   --   --   --   --   --   --   --   --   --   FERRITIN 20*  --  228  --  155  --   --   --   --   --   --  94  --  122  --  154  --   --   --   --   --   --   TIBC  --    < > 247* 276  --   --   --   --   --  262 305  --  255  --  239*  --   --   --   --   --   --   --   IRON   --    < > 21* 12*  --   --   --   --   --  46 62  --  86  --  51  --   --   --   --   --   --   --   RETICCTPCT  --    < > 1.7 1.8  --   --  2.2  --  1.7  --   --  1.1  --  0.7 0.9  --   --   --   --   --   --   --    < > = values in this interval not displayed.    Cardiac Enzymes: No results for input(s): "CKTOTAL", "CKMB", "CKMBINDEX", "TROPONINI" in the last 168 hours. CBG: No results for input(s): "GLUCAP" in the last  168 hours.  Iron  Studies: No results for input(s): "IRON ", "TIBC", "TRANSFERRIN", "FERRITIN" in the last 72 hours. Studies/Results: No results found.  Medications: Infusions:   Scheduled Medications:  atorvastatin   40 mg Oral Daily   budeson-glycopyrrolate -formoterol   2 puff Inhalation BID   carvedilol   6.25 mg Oral BID WC   darbepoetin (ARANESP ) injection - NON-DIALYSIS  60 mcg Subcutaneous Q Mon-1800   DULoxetine   30 mg Oral Daily   hydrALAZINE   100 mg Oral BID   levothyroxine   175 mcg Oral Q0600   pantoprazole   40 mg Oral Daily   Vitamin D  (Ergocalciferol )  50,000 Units Oral Q7 days    have reviewed scheduled and prn medications.  Physical Exam: General:NAD, comfortable Heart:RRR, s1s2 nl Lungs:clear b/l, no crackle Abdomen:soft, Non-tender, non-distended Extremities:No edema Neurology: Alert, awake and following commands.  Jihan Mellette Lansing Planas 05/03/2023,8:23 AM  LOS: 5 days

## 2023-05-03 NOTE — Plan of Care (Signed)

## 2023-05-03 NOTE — Progress Notes (Signed)
 PROGRESS NOTE    Walter Wilkerson  GNF:[id number] DOB: September 02, 1950 DOA: 04/27/2023 PCP: Araceli Knight, PA-C   Brief Narrative: Walter Wilkerson is a 73 y.o. male with a history of lung cancer s/p resection in remission, CKD stage IIIa, primary hypertension, hypothyroidism, emphysema, iron  deficiency anemia, chronic pain syndrome.  Patient presented secondary to abnormal outpatient labs with evidence of acute kidney injury.  Nephrology consulted for comanagement.    Assessment and Plan:  AKI on CKD stage IIIa Baseline creatinine difficult to identify but patient's creatinine has been between 1.2 and 1.8 this year.  Creatinine of 5.10 on admission.  Nephrology consulted.  Patient initially managed with IV fluids and was seen to have some mild improvement in creatinine before worsening again. -Nephrology recommendations: Renal biopsy, follow-up ANA, ANCA -IR for renal biopsy today  Microscopic hematuria Noted on urinalysis.  Acute respiratory failure with hypoxia Unclear etiology but possibly related to fluid overload. Patient requiring up to 2 L/min of supplemental oxygen. -Check chest x-ray  Hypomagnesemia Resolved with magnesium  supplementation.  Chronic bilateral LE edema Noted.  Primary hypertension -Continue carvedilol  and hydralazine   History of lung cancer Noted.  OSA -Continue CPAP nightly  Chronic pain syndrome Noted.  Hypothyroidism -Continue Synthroid  125 mcg daily  GERD -Continue Protonix   Nausea -Continue Reglan  and Zofran  as needed  Vitamin D  deficiency -Continue vitamin D  supplementation   DVT prophylaxis: SCDs Code Status:   Code Status: Full Code Family Communication: None at bedside Disposition Plan: Discharge pending ongoing nephrology recommendations   Consultants:  Nephrology Interventional radiology  Procedures:  Ultrasound-guided left renal biopsy  Antimicrobials: None   Subjective: Patient reports dyspnea and cough.  Afebrile. Dyspnea has worsened since admission.  Objective: BP (!) 147/75 (BP Location: Left Arm)   Pulse 73   Temp 98.4 F (36.9 C)   Resp 20   Ht 5\' 8"  (1.727 m)   Wt 82.4 kg   SpO2 97%   BMI 27.62 kg/m   Examination:  General exam: Appears calm and comfortable Respiratory system: Clear to auscultation. Respiratory effort normal. Cardiovascular system: S1 & S2 heard, RRR. 2/6 systolic heart murmur. Gastrointestinal system: Abdomen is nondistended, soft and nontender. Normal bowel sounds heard. Central nervous system: Alert and oriented. No focal neurological deficits. Musculoskeletal: No edema. No calf tenderness Skin: No cyanosis. No rashes Psychiatry: Judgement and insight appear normal. Mood & affect appropriate.    Data Reviewed: I have personally reviewed following labs and imaging studies  CBC Lab Results  Component Value Date   WBC 5.2 05/03/2023   RBC 3.08 (L) 05/03/2023   HGB 8.8 (L) 05/03/2023   HCT 27.9 (L) 05/03/2023   MCV 90.6 05/03/2023   MCH 28.6 05/03/2023   PLT 205 05/03/2023   MCHC 31.5 05/03/2023   RDW 18.0 (H) 05/03/2023   LYMPHSABS 0.7 04/27/2023   MONOABS 0.3 04/27/2023   EOSABS 0.1 04/27/2023   BASOSABS 0.1 04/27/2023     Last metabolic panel Lab Results  Component Value Date   NA 131 (L) 05/03/2023   K 4.0 05/03/2023   CL 100 05/03/2023   CO2 19 (L) 05/03/2023   BUN 58 (H) 05/03/2023   CREATININE 5.89 (H) 05/03/2023   GLUCOSE 94 05/03/2023   GFRNONAA 9 (L) 05/03/2023   GFRAA 83 03/04/2020   CALCIUM  8.8 (L) 05/03/2023   PHOS 3.9 05/03/2023   PROT 7.4 04/27/2023   ALBUMIN  2.8 (L) 05/02/2023   LABGLOB 3.3 04/30/2023   AGRATIO 0.8 04/30/2023   BILITOT 0.7 04/27/2023  ALKPHOS 66 04/27/2023   AST 15 04/27/2023   ALT 10 04/27/2023   ANIONGAP 12 05/03/2023    GFR: Estimated Creatinine Clearance: 11.7 mL/min (A) (by C-G formula based on SCr of 5.89 mg/dL (H)).  Recent Results (from the past 240 hours)  Urine Culture      Status: None   Collection Time: 04/27/23  7:48 PM   Specimen: Urine, Clean Catch  Result Value Ref Range Status   Specimen Description   Final    URINE, CLEAN CATCH Performed at [facility], [address], [place], [place] [address]    Special Requests   Final    NONE Performed at [facility], [address], [place], [place] [address]    Culture   Final    NO GROWTH Performed at [facility], [address], [place], [place] [address]    Report Status 04/29/2023 FINAL  Final      Radiology Studies: No results found.    LOS: 5 days    Aneita Keens, MD [facility] 05/03/2023, 8:18 AM   If 7PM-7AM, please contact night-coverage [email or web address]

## 2023-05-03 NOTE — Procedures (Signed)
 Interventional Radiology Procedure:   Indications: Acute kidney injury  Procedure: US  guided left renal biopsy  Findings: 2 cores from left kidney lower pole.  Gelfoam injected along biopsy tract.   Complications: None     EBL: Minimal  Plan: Bedrest 4 hours  Walter Wilkerson R. Walter Ogren, MD  Pager: [phone]

## 2023-05-03 NOTE — Plan of Care (Signed)

## 2023-05-04 ENCOUNTER — Inpatient Hospital Stay: Admitting: Medical Oncology

## 2023-05-04 ENCOUNTER — Inpatient Hospital Stay

## 2023-05-04 DIAGNOSIS — N179 Acute kidney failure, unspecified: Secondary | ICD-10-CM | POA: Diagnosis not present

## 2023-05-04 DIAGNOSIS — J9601 Acute respiratory failure with hypoxia: Secondary | ICD-10-CM | POA: Diagnosis not present

## 2023-05-04 LAB — CBC
HCT: 27.2 % — ABNORMAL LOW (ref 39.0–52.0)
Hemoglobin: 8.9 g/dL — ABNORMAL LOW (ref 13.0–17.0)
MCH: 29.2 pg (ref 26.0–34.0)
MCHC: 32.7 g/dL (ref 30.0–36.0)
MCV: 89.2 fL (ref 80.0–100.0)
Platelets: 224 10*3/uL (ref 150–400)
RBC: 3.05 MIL/uL — ABNORMAL LOW (ref 4.22–5.81)
RDW: 17.7 % — ABNORMAL HIGH (ref 11.5–15.5)
WBC: 4.6 10*3/uL (ref 4.0–10.5)
nRBC: 0 % (ref 0.0–0.2)

## 2023-05-04 LAB — ANCA TITERS
Atypical P-ANCA titer: <1:20 {titer}
C-ANCA: <1:20 {titer}
P-ANCA: 1:160 {titer} — ABNORMAL HIGH

## 2023-05-04 LAB — BASIC METABOLIC PANEL WITH GFR
Anion gap: 12 (ref 5–15)
BUN: 62 mg/dL — ABNORMAL HIGH (ref 8–23)
CO2: 20 mmol/L — ABNORMAL LOW (ref 22–32)
Calcium: 8.9 mg/dL (ref 8.9–10.3)
Chloride: 100 mmol/L (ref 98–111)
Creatinine, Ser: 5.95 mg/dL — ABNORMAL HIGH (ref 0.61–1.24)
GFR, Estimated: 9 mL/min — ABNORMAL LOW (ref >60)
Glucose, Bld: 99 mg/dL (ref 70–99)
Potassium: 3.9 mmol/L (ref 3.5–5.1)
Sodium: 132 mmol/L — ABNORMAL LOW (ref 135–145)

## 2023-05-04 NOTE — Plan of Care (Signed)

## 2023-05-04 NOTE — Plan of Care (Signed)

## 2023-05-04 NOTE — Progress Notes (Signed)
 [facility] NEPHROLOGY PROGRESS NOTE  Assessment/ Plan: Pt is a 73 y.o. yo male with past medical history significant for lung cancer s/p resection, hypertension, hypothyroidism, emphysema, anemia on erythropoietin  injection as outpatient, CKD 3 presented with AKI found in outpatient lab.  # AKI on CKD 3a - b/l creat 1.3- 1.8, from early 2025, eGFR 39-59 ml/min. Creat here was 5.1 on admission in the setting of swelling of feet/ hands leading to prescription of low-dose Lasix  within the last few weeks. Unclear cause of AKI. Not responding to IVF"s.  ATN vs AIN also a possibility.  Sent off serologies; hep B, hep C negative.  ASO, C3, C4, kappa lambda ratio, anti-GBM unremarkable.  Pending  ANA, ANCA.   S/p kidney biopsy by IR on 4/23, await biopsy result. Monitor lab, strict ins and outs.  No urgent need for dialysis.  # Hypertension: holding ARB, on hydralazine . Added Coreg  12.5 bid.  Monitor BP.  #Volume: as above, DOE today. Dc'd IVFs, received a dose of Lasix  on 4/20.   #Hematuria: monomorphic rbc's noted in urine sediment. No casts.   #History of iron  deficiency anemia: Iron  saturation 21%, ferritin 154.  Noted mild allergic reaction to iron .  Order Aranesp  on 4/21.  Monitor hemoglobin.  #History of COPD #History of lung cancer 2009  Subjective: Seen and examined.  He had kidney biopsy yesterday.  Denies hematuria, back pain, nausea, vomiting, chest pain or shortness of breath.  No other new event.  Reports passing clear urine.  Objective Vital signs in last 24 hours: Vitals:   05/03/23 1954 05/04/23 0445 05/04/23 0738 05/04/23 0739  BP: (!) 143/77 (!) 151/80    Pulse: 68 68    Resp: 18 18    Temp: 97.7 F (36.5 C) 98.1 F (36.7 C)    TempSrc: Oral Oral    SpO2: 97% 98% 98% 98%  Weight:      Height:       Weight change:   Intake/Output Summary (Last 24 hours) at 05/04/2023 0759 Last data filed at 05/03/2023 1400 Gross per 24 hour  Intake 120 ml  Output  --  Net 120 ml       Labs: RENAL PANEL Recent Labs  Lab 04/27/23 1734 04/28/23 0411 04/29/23 0500 04/30/23 0402 05/01/23 0420 05/02/23 0427 05/03/23 0458 05/04/23 0443  NA 134* 133* 134* 133* 132* 127* 131* 132*  K 3.9 3.6 4.0 3.9 4.1 4.1 4.0 3.9  CL 102 102 104 102 100 96* 100 100  CO2 23 19* 23 20* 24 21* 19* 20*  GLUCOSE 106* 92 100* 99 98 96 94 99  BUN 59* 55* 53* 45* 53* 59* 58* 62*  CREATININE 4.80* 4.69* 4.74* 4.62* 4.97* 5.63* 5.89* 5.95*  CALCIUM  8.4* 8.0* 8.3* 8.7* 8.5* 8.3* 8.8* 8.9  MG  --   --  1.3* 2.2 2.1 2.0 1.8  --   PHOS  --  4.3 4.0 3.7  --   --  3.9  --   ALBUMIN  3.2* 2.6* 2.7* 2.8*  --  2.8*  --   --     Liver Function Tests: Recent Labs  Lab 04/27/23 1017 04/27/23 1734 04/28/23 0411 04/29/23 0500 04/30/23 0402 05/02/23 0427  AST 12* 15  --   --   --   --   ALT 7 10  --   --   --   --   ALKPHOS 69 66  --   --   --   --   BILITOT  0.6 0.7  --   --   --   --   PROT 7.0 7.4  --   --   --   --   ALBUMIN  3.4* 3.2*   < > 2.7* 2.8* 2.8*   < > = values in this interval not displayed.   No results for input(s): "LIPASE", "AMYLASE" in the last 168 hours. No results for input(s): "AMMONIA" in the last 168 hours. CBC: Recent Labs    08/05/22 0839 08/05/22 0840 11/24/22 0928 11/30/22 0909 11/30/22 0910 12/05/22 1104 12/12/22 1003 12/19/22 1046 01/16/23 0938 01/16/23 0939 02/08/23 0952 02/08/23 0953 03/29/23 1017 03/29/23 1018 04/27/23 1017 04/27/23 1018 04/27/23 1734 04/30/23 0402 05/01/23 0420 05/02/23 0427 05/03/23 0458 05/04/23 0443  HGB  --    < > 8.8* 7.7*  --    < > 9.2*   < >  --  9.8* 10.8*  --  11.8*  --  10.3*  --    < > 9.5* 8.8* 9.3* 8.8* 8.9*  MCV  --    < > 84 83.8  --    < > 83.2   < >  --  85.8 86.9  --  85.5  --  86.5  --    < > 89.7 89.4 89.3 90.6 89.2  VITAMINB12 719  --  747  --   --   --   --   --   --   --   --   --   --   --   --   --   --   --   --   --   --   --   FOLATE  --   --  14.9  --   --   --   --    --   --   --   --   --   --   --   --   --   --   --   --   --   --   --   FERRITIN 20*  --  228  --  155  --   --   --   --   --   --  94  --  122  --  154  --   --   --   --   --   --   TIBC  --    < > 247* 276  --   --   --   --   --  262 305  --  255  --  239*  --   --   --   --   --   --   --   IRON   --    < > 21* 12*  --   --   --   --   --  46 62  --  86  --  51  --   --   --   --   --   --   --   RETICCTPCT  --    < > 1.7 1.8  --   --  2.2  --  1.7  --   --  1.1  --  0.7 0.9  --   --   --   --   --   --   --    < > = values in this interval not displayed.    Cardiac Enzymes: No results for input(s): "CKTOTAL", "CKMB", "CKMBINDEX", "  TROPONINI" in the last 168 hours. CBG: No results for input(s): "GLUCAP" in the last 168 hours.  Iron  Studies: No results for input(s): "IRON ", "TIBC", "TRANSFERRIN", "FERRITIN" in the last 72 hours. Studies/Results: US  BIOPSY (KIDNEY) Result Date: 05/03/2023 INDICATION: 71 year old with acute kidney injury. Request for a random renal biopsy. EXAM: ULTRASOUND-GUIDED RANDOM RENAL BIOPSY MEDICATIONS: Moderate sedation ANESTHESIA/SEDATION: Moderate (conscious) sedation was employed during this procedure. A total of Versed  2 mg and Fentanyl  100 mcg was administered intravenously by the radiology nurse. Total intra-service moderate Sedation Time: 22 minutes. The patient's level of consciousness and vital signs were monitored continuously by radiology nursing throughout the procedure under my direct supervision. FLUOROSCOPY TIME:  None COMPLICATIONS: None immediate. PROCEDURE: Informed written consent was obtained from the patient after a thorough discussion of the procedural risks, benefits and alternatives. All questions were addressed. A timeout was performed prior to the initiation of the procedure. Patient was placed prone. Both kidneys were evaluated with ultrasound. Left kidney was selected for biopsy. Left flank was prepped and draped in sterile fashion. Maximal  barrier sterile technique was utilized including caps, mask, sterile gowns, sterile gloves, sterile drape, hand hygiene and skin antiseptic. Skin was anesthetized using 1% lidocaine . Small incision was made. Using ultrasound guidance, a 17 gauge coaxial needle was directed to the lower pole cortex. Two core biopsies were obtained from the lower pole cortex with an 18 gauge core device. Specimens placed in saline. Gel-Foam slurry was injected as the 17 gauge coaxial needle was retracted. Bandage placed over the puncture site. FINDINGS: 2 adequate core biopsy specimens were obtained. No immediate bleeding or hematoma formation. IMPRESSION: Successful ultrasound-guided left renal biopsy. Electronically Signed   By: Elene Griffes M.D.   On: 05/03/2023 12:26   DG Chest 2 View Result Date: 05/03/2023 CLINICAL DATA:  Respiratory failure and hypoxia EXAM: CHEST - 2 VIEW COMPARISON:  03/29/2023 FINDINGS: Cardiac shadow is stable. Elevation of the right hemidiaphragm is seen. No focal infiltrate is seen. Emphysematous changes with chronic interstitial prominence are again seen and stable over multiple previous exams. The previously noted left-sided infiltrate has resolved. IMPRESSION: Chronic emphysematous changes with interstitial prominence. No focal confluent infiltrate is seen. Electronically Signed   By: Violeta Grey M.D.   On: 05/03/2023 11:15    Medications: Infusions:   Scheduled Medications:  atorvastatin   40 mg Oral Daily   budeson-glycopyrrolate -formoterol   2 puff Inhalation BID   carvedilol   6.25 mg Oral BID WC   darbepoetin (ARANESP ) injection - NON-DIALYSIS  60 mcg Subcutaneous Q Mon-1800   DULoxetine   30 mg Oral Daily   hydrALAZINE   100 mg Oral BID   levothyroxine   175 mcg Oral Q0600   pantoprazole   40 mg Oral Daily   Vitamin D  (Ergocalciferol )  50,000 Units Oral Q7 days    have reviewed scheduled and prn medications.  Physical Exam: General:NAD, comfortable Heart:RRR, s1s2 nl Lungs:clear  b/l, no crackle Abdomen:soft, Non-tender, non-distended Extremities:No edema Neurology: Alert, awake and following commands.  Walter Wilkerson 05/04/2023,7:59 AM  LOS: 6 days

## 2023-05-04 NOTE — Progress Notes (Signed)
 PROGRESS NOTE    Rickardo Brinegar  ZOX:[id number] DOB: [birth year] DOA: 04/27/2023 PCP: Araceli Knight, PA-C   Brief Narrative: Walter Wilkerson is a 73 y.o. male with a history of lung cancer s/p resection in remission, CKD stage IIIa, primary hypertension, hypothyroidism, emphysema, iron  deficiency anemia, chronic pain syndrome.  Patient presented secondary to abnormal outpatient labs with evidence of acute kidney injury.  Nephrology consulted for comanagement.    Assessment and Plan:  AKI on CKD stage IIIa Baseline creatinine difficult to identify but patient's creatinine has been between 1.2 and 1.8 this year.  Creatinine of 5.10 on admission.  Nephrology consulted.  Patient initially managed with IV fluids and was seen to have some mild improvement in creatinine before worsening again. Renal biopsy performed on 4/23. -Nephrology recommendations: Follow-up ANA, ANCA, pathology  Microscopic hematuria Noted on urinalysis.  Acute respiratory failure with hypoxia Unclear etiology but possibly related to fluid overload. Patient requiring up to 2 L/min of supplemental oxygen. Chest x-ray not remarkable for clear etiology. -Wean to room air  Hypomagnesemia Resolved with magnesium  supplementation.  Chronic bilateral LE edema Noted.  Primary hypertension -Continue carvedilol  and hydralazine   History of lung cancer Noted.  OSA -Continue CPAP nightly  Chronic pain syndrome Noted.  Normocytic anemia Stable.  Hypothyroidism -Continue Synthroid  125 mcg daily  GERD -Continue Protonix   Nausea -Continue Reglan  and Zofran  as needed  Vitamin D  deficiency -Continue vitamin D  supplementation   DVT prophylaxis: SCDs Code Status:   Code Status: Full Code Family Communication: None at bedside Disposition Plan: Discharge pending ongoing nephrology recommendations   Consultants:  Nephrology Interventional radiology  Procedures:  Ultrasound-guided left renal  biopsy  Antimicrobials: None   Subjective: No issues from overnight except for cough. Cough is improving. Robitussin helps.  Objective: BP (!) 151/80 (BP Location: Right Arm)   Pulse 68   Temp 98.1 F (36.7 C) (Oral)   Resp 18   Ht 5\' 8"  (1.727 m)   Wt 82.4 kg   SpO2 98%   BMI 27.62 kg/m   Examination:  General exam: Appears calm and comfortable Respiratory system: Clear to auscultation. Respiratory effort normal. Cardiovascular system: S1 & S2 heard, RRR. 2/6 systolic murmur. Gastrointestinal system: Abdomen is nondistended, soft and nontender. Normal bowel sounds heard. Central nervous system: Alert and oriented. No focal neurological deficits. Musculoskeletal: No edema. No calf tenderness Psychiatry: Judgement and insight appear normal. Mood & affect appropriate.    Data Reviewed: I have personally reviewed following labs and imaging studies  CBC Lab Results  Component Value Date   WBC 4.6 05/04/2023   RBC 3.05 (L) 05/04/2023   HGB 8.9 (L) 05/04/2023   HCT 27.2 (L) 05/04/2023   MCV 89.2 05/04/2023   MCH 29.2 05/04/2023   PLT 224 05/04/2023   MCHC 32.7 05/04/2023   RDW 17.7 (H) 05/04/2023   LYMPHSABS 0.7 04/27/2023   MONOABS 0.3 04/27/2023   EOSABS 0.1 04/27/2023   BASOSABS 0.1 04/27/2023     Last metabolic panel Lab Results  Component Value Date   NA 132 (L) 05/04/2023   K 3.9 05/04/2023   CL 100 05/04/2023   CO2 20 (L) 05/04/2023   BUN 62 (H) 05/04/2023   CREATININE 5.95 (H) 05/04/2023   GLUCOSE 99 05/04/2023   GFRNONAA 9 (L) 05/04/2023   GFRAA 83 03/04/2020   CALCIUM  8.9 05/04/2023   PHOS 3.9 05/03/2023   PROT 7.4 04/27/2023   ALBUMIN  2.8 (L) 05/02/2023   LABGLOB 3.3 04/30/2023   AGRATIO 0.8  04/30/2023   BILITOT 0.7 04/27/2023   ALKPHOS 66 04/27/2023   AST 15 04/27/2023   ALT 10 04/27/2023   ANIONGAP 12 05/04/2023    GFR: Estimated Creatinine Clearance: 11.6 mL/min (A) (by C-G formula based on SCr of 5.95 mg/dL (H)).  Recent  Results (from the past 240 hours)  Urine Culture     Status: None   Collection Time: 04/27/23  7:48 PM   Specimen: Urine, Clean Catch  Result Value Ref Range Status   Specimen Description   Final    URINE, CLEAN CATCH Performed at [facility], [address] [place], [place] [address]    Special Requests   Final    NONE Performed at [facility], [address] [place], [place] [address]    Culture   Final    NO GROWTH Performed at [facility], [address] [place], [place] [address]    Report Status 04/29/2023 FINAL  Final      Radiology Studies: US  BIOPSY (KIDNEY) Result Date: 05/03/2023 INDICATION: 1 year old with acute kidney injury. Request for a random renal biopsy. EXAM: ULTRASOUND-GUIDED RANDOM RENAL BIOPSY MEDICATIONS: Moderate sedation ANESTHESIA/SEDATION: Moderate (conscious) sedation was employed during this procedure. A total of Versed  2 mg and Fentanyl  100 mcg was administered intravenously by the radiology nurse. Total intra-service moderate Sedation Time: 22 minutes. The patient's level of consciousness and vital signs were monitored continuously by radiology nursing throughout the procedure under my direct supervision. FLUOROSCOPY TIME:  None COMPLICATIONS: None immediate. PROCEDURE: Informed written consent was obtained from the patient after a thorough discussion of the procedural risks, benefits and alternatives. All questions were addressed. A timeout was performed prior to the initiation of the procedure. Patient was placed prone. Both kidneys were evaluated with ultrasound. Left kidney was selected for biopsy. Left flank was prepped and draped in sterile fashion. Maximal barrier sterile technique was utilized including caps, mask, sterile gowns, sterile gloves, sterile drape, hand hygiene and skin antiseptic. Skin was anesthetized using 1% lidocaine . Small incision was made. Using ultrasound guidance, a 17 gauge coaxial  needle was directed to the lower pole cortex. Two core biopsies were obtained from the lower pole cortex with an 18 gauge core device. Specimens placed in saline. Gel-Foam slurry was injected as the 17 gauge coaxial needle was retracted. Bandage placed over the puncture site. FINDINGS: 2 adequate core biopsy specimens were obtained. No immediate bleeding or hematoma formation. IMPRESSION: Successful ultrasound-guided left renal biopsy. Electronically Signed   By: Elene Griffes M.D.   On: 05/03/2023 12:26   DG Chest 2 View Result Date: 05/03/2023 CLINICAL DATA:  Respiratory failure and hypoxia EXAM: CHEST - 2 VIEW COMPARISON:  03/29/2023 FINDINGS: Cardiac shadow is stable. Elevation of the right hemidiaphragm is seen. No focal infiltrate is seen. Emphysematous changes with chronic interstitial prominence are again seen and stable over multiple previous exams. The previously noted left-sided infiltrate has resolved. IMPRESSION: Chronic emphysematous changes with interstitial prominence. No focal confluent infiltrate is seen. Electronically Signed   By: Violeta Grey M.D.   On: 05/03/2023 11:15      LOS: 6 days    Aneita Keens, MD [facility] 05/04/2023, 9:55 AM   If 7PM-7AM, please contact night-coverage [email or web address]

## 2023-05-05 DIAGNOSIS — N019 Rapidly progressive nephritic syndrome with unspecified morphologic changes: Secondary | ICD-10-CM

## 2023-05-05 DIAGNOSIS — J9601 Acute respiratory failure with hypoxia: Secondary | ICD-10-CM | POA: Diagnosis not present

## 2023-05-05 DIAGNOSIS — N179 Acute kidney failure, unspecified: Secondary | ICD-10-CM | POA: Diagnosis not present

## 2023-05-05 LAB — BASIC METABOLIC PANEL WITH GFR
Anion gap: 11 (ref 5–15)
BUN: 64 mg/dL — ABNORMAL HIGH (ref 8–23)
CO2: 22 mmol/L (ref 22–32)
Calcium: 8.5 mg/dL — ABNORMAL LOW (ref 8.9–10.3)
Chloride: 97 mmol/L — ABNORMAL LOW (ref 98–111)
Creatinine, Ser: 6.2 mg/dL — ABNORMAL HIGH (ref 0.61–1.24)
GFR, Estimated: 9 mL/min — ABNORMAL LOW (ref >60)
Glucose, Bld: 100 mg/dL — ABNORMAL HIGH (ref 70–99)
Potassium: 3.7 mmol/L (ref 3.5–5.1)
Sodium: 130 mmol/L — ABNORMAL LOW (ref 135–145)

## 2023-05-05 LAB — ANTINUCLEAR ANTIBODIES, IFA: ANA Ab, IFA: POSITIVE — AB

## 2023-05-05 LAB — FANA STAINING PATTERNS: Speckled Pattern: 1:1280 {titer} — ABNORMAL HIGH

## 2023-05-05 LAB — ALBUMIN: Albumin: 2.8 g/dL — ABNORMAL LOW (ref 3.5–5.0)

## 2023-05-05 MED ORDER — EPINEPHRINE PF 1 MG/ML IJ SOLN: 0.3000 mg | INTRAMUSCULAR | Status: DC | PRN

## 2023-05-05 MED ORDER — ACETAMINOPHEN 325 MG PO TABS
650.0000 mg | ORAL_TABLET | Freq: Once | ORAL | Status: AC
  Administered 2023-05-05: 650 mg via ORAL
  Filled 2023-05-05: qty 2

## 2023-05-05 MED ORDER — FAMOTIDINE 20 MG PO TABS
10.0000 mg | ORAL_TABLET | Freq: Every day | ORAL | Status: AC
  Administered 2023-05-05 – 2023-05-09 (×5): 10 mg via ORAL
  Filled 2023-05-05 (×5): qty 1

## 2023-05-05 MED ORDER — SODIUM CHLORIDE 0.9 % IV SOLN
1000.0000 mg | Freq: Once | INTRAVENOUS | Status: AC
  Administered 2023-05-05: 1000 mg via INTRAVENOUS
  Filled 2023-05-05: qty 100

## 2023-05-05 MED ORDER — FAMOTIDINE IN NACL 20-0.9 MG/50ML-% IV SOLN: 20.0000 mg | Freq: Once | INTRAVENOUS | Status: DC | PRN

## 2023-05-05 MED ORDER — ALBUTEROL SULFATE (2.5 MG/3ML) 0.083% IN NEBU: 2.5000 mg | INHALATION_SOLUTION | Freq: Once | RESPIRATORY_TRACT | Status: DC | PRN

## 2023-05-05 MED ORDER — SODIUM CHLORIDE 0.9 % IV SOLN
1000.0000 mg | INTRAVENOUS | Status: AC
  Administered 2023-05-05 – 2023-05-07 (×3): 1000 mg via INTRAVENOUS
  Filled 2023-05-05 (×3): qty 16

## 2023-05-05 MED ORDER — DIPHENHYDRAMINE HCL 25 MG PO CAPS
50.0000 mg | ORAL_CAPSULE | Freq: Once | ORAL | Status: AC
  Administered 2023-05-05: 50 mg via ORAL
  Filled 2023-05-05: qty 2

## 2023-05-05 MED ORDER — SODIUM CHLORIDE 0.9 % IV BOLUS: 1000.0000 mL | Freq: Once | INTRAVENOUS | Status: DC | PRN

## 2023-05-05 MED ORDER — DIPHENHYDRAMINE HCL 50 MG/ML IJ SOLN: 50.0000 mg | Freq: Once | INTRAMUSCULAR | Status: DC | PRN

## 2023-05-05 NOTE — Progress Notes (Addendum)
 [facility] NEPHROLOGY PROGRESS NOTE  Assessment/ Plan: Pt is a 73 y.o. yo male with past medical history significant for lung cancer s/p resection, hypertension, hypothyroidism, emphysema, anemia on erythropoietin  injection as outpatient, CKD 3 presented with AKI found in outpatient lab.  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  Anti-GBM negative.  Global glomerulosclerosis 9 out of 35 glomeruli, segmental sclerosis absent, moderate IF, mild tubular atrophy, mild arterial intimal fibrosis and no arteriolar hyalinosis. Patient presented with AKI, creatinine level is around 6, nonoliguric.  Urine with actives sediment.  Anti-GBM, C3, C4, kappa lambda ratio, hep B, hep C negative.  ANA pending. I have discussed the biopsy finding with the pathologist yesterday.  I have reviewed the biopsy results, treatment plan with the patient and his brother today.  We discussed about treating with Solu-Medrol  and rituximab .  The side effect of rituximab  including immunosuppression, infection which can be serious like sepsis, lymphoma, abnormalities in blood count etc. at length.  They verbalized understanding and he agreed to pursue treatment with rituximab  and Solu-Medrol .  I have consulted pharmacist as well. Pepcid  and vitamin D  ordered. Strict ins and outs and daily lab. No urgent need for dialysis.  # Hypertension: holding ARB, on hydralazine . Added Coreg  12.5 bid.  Monitor BP. Lasix  as needed to manage volume.  #Hematuria: monomorphic rbc's noted in urine sediment. No casts.   #History of iron  deficiency anemia: Iron  saturation 21%, ferritin 154.  Noted mild allergic reaction to iron .  Order Aranesp  on 4/21.  Monitor hemoglobin.  #History of COPD #History of lung cancer 2009  Subjective: Seen and examined.  Reviewed AKI, biopsy results etc. with the patient and his brother over the phone at length  today.  Patient reported bad sleep.  He denies coughing, hemoptysis.  No nausea, vomiting, chest pain or shortness of breath.  No fever or chills. Objective Vital signs in last 24 hours: Vitals:   05/04/23 1317 05/04/23 1916 05/04/23 1930 05/05/23 0523  BP: 125/67 (!) 153/77  133/76  Pulse: 68 66  71  Resp: 18 18  18   Temp: 97.8 F (36.6 C) 98.1 F (36.7 C)  98.1 F (36.7 C)  TempSrc: Oral Oral    SpO2: 98% 97% 98% 96%  Weight:      Height:       Weight change:   Intake/Output Summary (Last 24 hours) at 05/05/2023 0831 Last data filed at 05/04/2023 1600 Gross per 24 hour  Intake 120 ml  Output 200 ml  Net -80 ml       Labs: RENAL PANEL Recent Labs  Lab 04/29/23 0500 04/30/23 0402 05/01/23 0420 05/02/23 0427 05/03/23 0458 05/04/23 0443 05/05/23 0357  NA 134* 133* 132* 127* 131* 132* 130*  K 4.0 3.9 4.1 4.1 4.0 3.9 3.7  CL 104 102 100 96* 100 100 97*  CO2 23 20* 24 21* 19* 20* 22  GLUCOSE 100* 99 98 96 94 99 100*  BUN 53* 45* 53* 59* 58* 62* 64*  CREATININE 4.74* 4.62* 4.97* 5.63* 5.89* 5.95* 6.20*  CALCIUM  8.3* 8.7* 8.5* 8.3* 8.8* 8.9 8.5*  MG 1.3* 2.2 2.1 2.0 1.8  --   --   PHOS 4.0 3.7  --   --  3.9  --   --   ALBUMIN  2.7* 2.8*  --  2.8*  --   --  2.8*    Liver Function Tests: Recent Labs  Lab 04/30/23 0402 05/02/23 0427 05/05/23 0357  ALBUMIN  2.8* 2.8* 2.8*   No results for input(s): "LIPASE", "AMYLASE" in the last 168 hours. No results for input(s): "AMMONIA" in the last 168 hours. CBC: Recent Labs    08/05/22 0839 08/05/22 0840 11/24/22 0928 11/30/22 0909 11/30/22 0910 12/05/22 1104 12/12/22 1003 12/19/22 1046 01/16/23 0938 01/16/23 0939 02/08/23 0952 02/08/23 0953 03/29/23 1017 03/29/23 1018 04/27/23 1017 04/27/23 1018 04/27/23 1734 04/30/23 0402 05/01/23 0420 05/02/23 0427 05/03/23 0458 05/04/23 0443  HGB  --    < > 8.8* 7.7*  --    < > 9.2*   < >  --  9.8* 10.8*  --  11.8*  --  10.3*  --    < > 9.5* 8.8* 9.3* 8.8* 8.9*   MCV  --    < > 84 83.8  --    < > 83.2   < >  --  85.8 86.9  --  85.5  --  86.5  --    < > 89.7 89.4 89.3 90.6 89.2  VITAMINB12 719  --  747  --   --   --   --   --   --   --   --   --   --   --   --   --   --   --   --   --   --   --   FOLATE  --   --  14.9  --   --   --   --   --   --   --   --   --   --   --   --   --   --   --   --   --   --   --   FERRITIN 20*  --  228  --  155  --   --   --   --   --   --  94  --  122  --  154  --   --   --   --   --   --   TIBC  --    < > 247* 276  --   --   --   --   --  262 305  --  255  --  239*  --   --   --   --   --   --   --   IRON   --    < > 21* 12*  --   --   --   --   --  46 62  --  86  --  51  --   --   --   --   --   --   --   RETICCTPCT  --    < > 1.7 1.8  --   --  2.2  --  1.7  --   --  1.1  --  0.7 0.9  --   --   --   --   --   --   --    < > = values in this interval not displayed.    Cardiac Enzymes: No results for input(s): "CKTOTAL", "CKMB", "CKMBINDEX", "TROPONINI" in the last 168 hours. CBG: No results for input(s): "GLUCAP" in the last 168 hours.  Iron  Studies: No results for input(s): "IRON ", "TIBC", "TRANSFERRIN", "FERRITIN" in the last 72 hours. Studies/Results: US  BIOPSY (KIDNEY) Result Date: 05/03/2023 INDICATION: 24 year old with acute kidney injury. Request for  a random renal biopsy. EXAM: ULTRASOUND-GUIDED RANDOM RENAL BIOPSY MEDICATIONS: Moderate sedation ANESTHESIA/SEDATION: Moderate (conscious) sedation was employed during this procedure. A total of Versed  2 mg and Fentanyl  100 mcg was administered intravenously by the radiology nurse. Total intra-service moderate Sedation Time: 22 minutes. The patient's level of consciousness and vital signs were monitored continuously by radiology nursing throughout the procedure under my direct supervision. FLUOROSCOPY TIME:  None COMPLICATIONS: None immediate. PROCEDURE: Informed written consent was obtained from the patient after a thorough discussion of the procedural risks, benefits  and alternatives. All questions were addressed. A timeout was performed prior to the initiation of the procedure. Patient was placed prone. Both kidneys were evaluated with ultrasound. Left kidney was selected for biopsy. Left flank was prepped and draped in sterile fashion. Maximal barrier sterile technique was utilized including caps, mask, sterile gowns, sterile gloves, sterile drape, hand hygiene and skin antiseptic. Skin was anesthetized using 1% lidocaine . Small incision was made. Using ultrasound guidance, a 17 gauge coaxial needle was directed to the lower pole cortex. Two core biopsies were obtained from the lower pole cortex with an 18 gauge core device. Specimens placed in saline. Gel-Foam slurry was injected as the 17 gauge coaxial needle was retracted. Bandage placed over the puncture site. FINDINGS: 2 adequate core biopsy specimens were obtained. No immediate bleeding or hematoma formation. IMPRESSION: Successful ultrasound-guided left renal biopsy. Electronically Signed   By: Elene Griffes M.D.   On: 05/03/2023 12:26   DG Chest 2 View Result Date: 05/03/2023 CLINICAL DATA:  Respiratory failure and hypoxia EXAM: CHEST - 2 VIEW COMPARISON:  03/29/2023 FINDINGS: Cardiac shadow is stable. Elevation of the right hemidiaphragm is seen. No focal infiltrate is seen. Emphysematous changes with chronic interstitial prominence are again seen and stable over multiple previous exams. The previously noted left-sided infiltrate has resolved. IMPRESSION: Chronic emphysematous changes with interstitial prominence. No focal confluent infiltrate is seen. Electronically Signed   By: Violeta Grey M.D.   On: 05/03/2023 11:15    Medications: Infusions:  famotidine  (PEPCID ) IV     methylPREDNISolone  (SOLU-MEDROL ) injection     riTUXimab -pvvr     sodium chloride       Scheduled Medications:  acetaminophen   650 mg Oral Once   atorvastatin   40 mg Oral Daily   budeson-glycopyrrolate -formoterol   2 puff Inhalation BID    carvedilol   6.25 mg Oral BID WC   darbepoetin (ARANESP ) injection - NON-DIALYSIS  60 mcg Subcutaneous Q Mon-1800   diphenhydrAMINE   50 mg Oral Once   DULoxetine   30 mg Oral Daily   famotidine   10 mg Oral Daily   hydrALAZINE   100 mg Oral BID   levothyroxine   175 mcg Oral Q0600   pantoprazole   40 mg Oral Daily   Vitamin D  (Ergocalciferol )  50,000 Units Oral Q7 days    have reviewed scheduled and prn medications.  Physical Exam: General: Able to lie flat comfortable, not in distress Heart:RRR, s1s2 nl Lungs: Clear bilateral, no wheeze or crackle Abdomen:soft, Non-tender, non-distended Extremities:No edema Neurology: Alert, awake and following commands.  Allex Lapoint Prasad Devere Brem 05/05/2023,8:31 AM  LOS: 7 days

## 2023-05-05 NOTE — Plan of Care (Signed)

## 2023-05-05 NOTE — Progress Notes (Addendum)
 PROGRESS NOTE    Walter Wilkerson  ZOX:[id number] DOB: 1950-04-30 DOA: 04/27/2023 PCP: Araceli Knight, PA-C   Brief Narrative: Walter Wilkerson is a 73 y.o. male with a history of lung cancer s/p resection in remission, CKD stage IIIa, primary hypertension, hypothyroidism, emphysema, iron  deficiency anemia, chronic pain syndrome.  Patient presented secondary to abnormal outpatient labs with evidence of acute kidney injury.  Nephrology consulted for comanagement.    Assessment and Plan:  AKI on CKD stage IIIa Rapidly progressive glomerulonephritis, pauci-immune Baseline creatinine difficult to identify but patient's creatinine has been between 1.2 and 1.8 this year.  Creatinine of 5.10 on admission.  Nephrology consulted.  Patient initially managed with IV fluids and was seen to have some mild improvement in creatinine before worsening again. 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  -Nephrology recommendations: Rituximab , methylprednisolone   Microscopic hematuria Noted on urinalysis.  Acute respiratory failure with hypoxia Unclear etiology but possibly related to fluid overload. Patient requiring up to 2 L/min of supplemental oxygen. Chest x-ray not remarkable for clear etiology. -Wean to room air  Hypomagnesemia Resolved with magnesium  supplementation.  Chronic bilateral LE edema Noted.  Primary hypertension -Continue carvedilol  and hydralazine   History of lung cancer Noted.  OSA -Continue CPAP nightly  Chronic pain syndrome Noted.  Normocytic anemia Stable.  Hypothyroidism -Continue Synthroid  125 mcg daily  GERD -Continue Protonix   Nausea -Continue Reglan  and Zofran  as needed  Vitamin D  deficiency -Continue vitamin D  supplementation   DVT prophylaxis: SCDs Code Status:   Code Status: Full Code Family Communication: None at bedside Disposition Plan:  Discharge pending ongoing nephrology recommendations   Consultants:  Nephrology Interventional radiology  Procedures:  Ultrasound-guided left renal biopsy  Antimicrobials: None   Subjective: Some mild dyspnea. No other concerns.  Objective: BP 133/76 (BP Location: Right Arm)   Pulse 71   Temp 98.1 F (36.7 C)   Resp 18   Ht 5\' 8"  (1.727 m)   Wt 82.4 kg   SpO2 96%   BMI 27.62 kg/m   Examination:  General exam: Appears calm and comfortable Respiratory system: Respiratory effort normal. Central nervous system: Alert and oriented. No focal neurological deficits. Psychiatry: Judgement and insight appear normal. Mood & affect appropriate.    Data Reviewed: I have personally reviewed following labs and imaging studies  CBC Lab Results  Component Value Date   WBC 4.6 05/04/2023   RBC 3.05 (L) 05/04/2023   HGB 8.9 (L) 05/04/2023   HCT 27.2 (L) 05/04/2023   MCV 89.2 05/04/2023   MCH 29.2 05/04/2023   PLT 224 05/04/2023   MCHC 32.7 05/04/2023   RDW 17.7 (H) 05/04/2023   LYMPHSABS 0.7 04/27/2023   MONOABS 0.3 04/27/2023   EOSABS 0.1 04/27/2023   BASOSABS 0.1 04/27/2023     Last metabolic panel Lab Results  Component Value Date   NA 130 (L) 05/05/2023   K 3.7 05/05/2023   CL 97 (L) 05/05/2023   CO2 22 05/05/2023   BUN 64 (H) 05/05/2023   CREATININE 6.20 (H) 05/05/2023   GLUCOSE 100 (H) 05/05/2023   GFRNONAA 9 (L) 05/05/2023   GFRAA 83 03/04/2020   CALCIUM  8.5 (L) 05/05/2023   PHOS 3.9 05/03/2023   PROT 7.4 04/27/2023   ALBUMIN  2.8 (L) 05/05/2023   LABGLOB 3.3 04/30/2023   AGRATIO 0.8 04/30/2023   BILITOT 0.7 04/27/2023   ALKPHOS 66 04/27/2023   AST 15 04/27/2023   ALT 10 04/27/2023  ANIONGAP 11 05/05/2023    GFR: Estimated Creatinine Clearance: 11.1 mL/min (A) (by C-G formula based on SCr of 6.2 mg/dL (H)).  Recent Results (from the past 240 hours)  Urine Culture     Status: None   Collection Time: 04/27/23  7:48 PM   Specimen: Urine, Clean  Catch  Result Value Ref Range Status   Specimen Description   Final    URINE, CLEAN CATCH Performed at [facility], [address], [place], [place] [address]    Special Requests   Final    NONE Performed at [facility], [address], [place], [place] [address]    Culture   Final    NO GROWTH Performed at [facility], [address] [place], [place] [address]    Report Status 04/29/2023 FINAL  Final      Radiology Studies: US  BIOPSY (KIDNEY) Result Date: 05/03/2023 INDICATION: 23 year old with acute kidney injury. Request for a random renal biopsy. EXAM: ULTRASOUND-GUIDED RANDOM RENAL BIOPSY MEDICATIONS: Moderate sedation ANESTHESIA/SEDATION: Moderate (conscious) sedation was employed during this procedure. A total of Versed  2 mg and Fentanyl  100 mcg was administered intravenously by the radiology nurse. Total intra-service moderate Sedation Time: 22 minutes. The patient's level of consciousness and vital signs were monitored continuously by radiology nursing throughout the procedure under my direct supervision. FLUOROSCOPY TIME:  None COMPLICATIONS: None immediate. PROCEDURE: Informed written consent was obtained from the patient after a thorough discussion of the procedural risks, benefits and alternatives. All questions were addressed. A timeout was performed prior to the initiation of the procedure. Patient was placed prone. Both kidneys were evaluated with ultrasound. Left kidney was selected for biopsy. Left flank was prepped and draped in sterile fashion. Maximal barrier sterile technique was utilized including caps, mask, sterile gowns, sterile gloves, sterile drape, hand hygiene and skin antiseptic. Skin was anesthetized using 1% lidocaine . Small incision was made. Using ultrasound guidance, a 17 gauge coaxial needle was directed to the lower pole cortex. Two core biopsies were obtained from the lower pole cortex with an 18 gauge core  device. Specimens placed in saline. Gel-Foam slurry was injected as the 17 gauge coaxial needle was retracted. Bandage placed over the puncture site. FINDINGS: 2 adequate core biopsy specimens were obtained. No immediate bleeding or hematoma formation. IMPRESSION: Successful ultrasound-guided left renal biopsy. Electronically Signed   By: Elene Griffes M.D.   On: 05/03/2023 12:26   DG Chest 2 View Result Date: 05/03/2023 CLINICAL DATA:  Respiratory failure and hypoxia EXAM: CHEST - 2 VIEW COMPARISON:  03/29/2023 FINDINGS: Cardiac shadow is stable. Elevation of the right hemidiaphragm is seen. No focal infiltrate is seen. Emphysematous changes with chronic interstitial prominence are again seen and stable over multiple previous exams. The previously noted left-sided infiltrate has resolved. IMPRESSION: Chronic emphysematous changes with interstitial prominence. No focal confluent infiltrate is seen. Electronically Signed   By: Violeta Grey M.D.   On: 05/03/2023 11:15      LOS: 7 days    Aneita Keens, MD [facility] 05/05/2023, 9:01 AM   If 7PM-7AM, please contact night-coverage [email or web address]

## 2023-05-06 DIAGNOSIS — N019 Rapidly progressive nephritic syndrome with unspecified morphologic changes: Secondary | ICD-10-CM | POA: Diagnosis not present

## 2023-05-06 DIAGNOSIS — J9601 Acute respiratory failure with hypoxia: Secondary | ICD-10-CM | POA: Diagnosis not present

## 2023-05-06 DIAGNOSIS — N179 Acute kidney failure, unspecified: Secondary | ICD-10-CM | POA: Diagnosis not present

## 2023-05-06 LAB — CBC
HCT: 28.2 % — ABNORMAL LOW (ref 39.0–52.0)
Hemoglobin: 9.4 g/dL — ABNORMAL LOW (ref 13.0–17.0)
MCH: 29.4 pg (ref 26.0–34.0)
MCHC: 33.3 g/dL (ref 30.0–36.0)
MCV: 88.1 fL (ref 80.0–100.0)
Platelets: 226 10*3/uL (ref 150–400)
RBC: 3.2 MIL/uL — ABNORMAL LOW (ref 4.22–5.81)
RDW: 17.7 % — ABNORMAL HIGH (ref 11.5–15.5)
WBC: 5.1 10*3/uL (ref 4.0–10.5)
nRBC: 0 % (ref 0.0–0.2)

## 2023-05-06 LAB — RENAL FUNCTION PANEL
Albumin: 2.8 g/dL — ABNORMAL LOW (ref 3.5–5.0)
Anion gap: 11 (ref 5–15)
BUN: 70 mg/dL — ABNORMAL HIGH (ref 8–23)
CO2: 20 mmol/L — ABNORMAL LOW (ref 22–32)
Calcium: 8.6 mg/dL — ABNORMAL LOW (ref 8.9–10.3)
Chloride: 100 mmol/L (ref 98–111)
Creatinine, Ser: 6.47 mg/dL — ABNORMAL HIGH (ref 0.61–1.24)
GFR, Estimated: 8 mL/min — ABNORMAL LOW (ref >60)
Glucose, Bld: 193 mg/dL — ABNORMAL HIGH (ref 70–99)
Phosphorus: 5.3 mg/dL — ABNORMAL HIGH (ref 2.5–4.6)
Potassium: 4.1 mmol/L (ref 3.5–5.1)
Sodium: 131 mmol/L — ABNORMAL LOW (ref 135–145)

## 2023-05-06 LAB — GLUCOSE, CAPILLARY
Glucose-Capillary: 193 mg/dL — ABNORMAL HIGH (ref 70–99)
Glucose-Capillary: 163 mg/dL — ABNORMAL HIGH (ref 70–99)
Glucose-Capillary: 231 mg/dL — ABNORMAL HIGH (ref 70–99)

## 2023-05-06 MED ORDER — MELATONIN 3 MG PO TABS
3.0000 mg | ORAL_TABLET | Freq: Once | ORAL | Status: AC
  Administered 2023-05-06: 3 mg via ORAL
  Filled 2023-05-06: qty 1

## 2023-05-06 NOTE — Plan of Care (Signed)

## 2023-05-06 NOTE — Progress Notes (Signed)
 PROGRESS NOTE    Walter Wilkerson  ZOX:[id number] DOB: [birth year] DOA: 04/27/2023 PCP: Araceli Knight, PA-C   Brief Narrative: Walter Wilkerson is a 73 y.o. male with a history of lung cancer s/p resection in remission, CKD stage IIIa, primary hypertension, hypothyroidism, emphysema, iron  deficiency anemia, chronic pain syndrome.  Patient presented secondary to abnormal outpatient labs with evidence of acute kidney injury.  Nephrology consulted for comanagement. 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    Assessment and Plan:  AKI on CKD stage IIIa Rapidly progressive glomerulonephritis, pauci-immune Baseline creatinine difficult to identify but patient's creatinine has been between 1.2 and 1.8 this year.  Creatinine of 5.10 on admission.  Nephrology consulted.  Patient initially managed with IV fluids and was seen to have some mild improvement in creatinine before worsening again. 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  -Nephrology recommendations: Rituximab , methylprednisolone   Microscopic hematuria Noted on urinalysis.  Acute respiratory failure with hypoxia Unclear etiology but possibly related to fluid overload. Patient requiring up to 2 L/min of supplemental oxygen. Chest x-ray not remarkable for clear etiology. -Wean to room air  Hypomagnesemia Resolved with magnesium  supplementation.  Chronic bilateral LE edema Noted.  Primary hypertension -Continue carvedilol  and hydralazine   History of lung cancer Noted.  OSA -Continue CPAP nightly  Chronic pain syndrome Noted.  Normocytic anemia Stable.  Hypothyroidism -Continue Synthroid  125 mcg daily  GERD -Continue Protonix   Nausea -Continue Reglan  and Zofran  as needed  Hyperglycemia Likely steroid induced. -Watch blood sugar  Vitamin D  deficiency -Continue vitamin D   supplementation   DVT prophylaxis: SCDs Code Status:   Code Status: Full Code Family Communication: None at bedside Disposition Plan: Discharge pending ongoing nephrology recommendations   Consultants:  Nephrology Interventional radiology  Procedures:  Ultrasound-guided left renal biopsy  Antimicrobials: None   Subjective: Not feeling great since new diagnosis. No other concerns.  Objective: BP (!) 158/78   Pulse 71   Temp 97.7 F (36.5 C)   Resp 16   Ht 5\' 8"  (1.727 m)   Wt 81.4 kg   SpO2 96%   BMI 27.29 kg/m   Examination:  General exam: Appears calm and comfortable   Data Reviewed: I have personally reviewed following labs and imaging studies  CBC Lab Results  Component Value Date   WBC 5.1 05/06/2023   RBC 3.20 (L) 05/06/2023   HGB 9.4 (L) 05/06/2023   HCT 28.2 (L) 05/06/2023   MCV 88.1 05/06/2023   MCH 29.4 05/06/2023   PLT 226 05/06/2023   MCHC 33.3 05/06/2023   RDW 17.7 (H) 05/06/2023   LYMPHSABS 0.7 04/27/2023   MONOABS 0.3 04/27/2023   EOSABS 0.1 04/27/2023   BASOSABS 0.1 04/27/2023     Last metabolic panel Lab Results  Component Value Date   NA 131 (L) 05/06/2023   K 4.1 05/06/2023   CL 100 05/06/2023   CO2 20 (L) 05/06/2023   BUN 70 (H) 05/06/2023   CREATININE 6.47 (H) 05/06/2023   GLUCOSE 193 (H) 05/06/2023   GFRNONAA 8 (L) 05/06/2023   GFRAA 83 03/04/2020   CALCIUM  8.6 (L) 05/06/2023   PHOS 5.3 (H) 05/06/2023   PROT 7.4 04/27/2023   ALBUMIN  2.8 (L) 05/06/2023   LABGLOB 3.3 04/30/2023   AGRATIO 0.8 04/30/2023   BILITOT 0.7 04/27/2023   ALKPHOS 66 04/27/2023   AST 15 04/27/2023   ALT 10 04/27/2023   ANIONGAP 11 05/06/2023  GFR: Estimated Creatinine Clearance: 9.8 mL/min (A) (by C-G formula based on SCr of 6.47 mg/dL (H)).  Recent Results (from the past 240 hours)  Urine Culture     Status: None   Collection Time: 04/27/23  7:48 PM   Specimen: Urine, Clean Catch  Result Value Ref Range Status   Specimen  Description   Final    URINE, CLEAN CATCH Performed at [facility], [address], [place], [place] [address]    Special Requests   Final    NONE Performed at [facility], [address] [place], [place] [address]    Culture   Final    NO GROWTH Performed at [facility], [address] [place], [place] [address]    Report Status 04/29/2023 FINAL  Final      Radiology Studies: No results found.     LOS: 8 days    Aneita Keens, MD [facility] 05/06/2023, 11:52 AM   If 7PM-7AM, please contact night-coverage [email or web address]

## 2023-05-06 NOTE — Progress Notes (Signed)
 [facility] NEPHROLOG[facility] PROGRESS NOTE  Assessment/ Plan: Pt is a 73 y.o. yo male with past medical history significant for lung cancer s/p resection, hypertension, hypothyroidism, emphysema, anemia on erythropoietin  injection as outpatient, CKD 3 presented with AKI found in outpatient lab.  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  Anti-GBM negative.  Global glomerulosclerosis 9 out of 35 glomeruli, segmental sclerosis absent, moderate IF, mild tubular atrophy, mild arterial intimal fibrosis and no arteriolar hyalinosis. Patient presented with AKI, creatinine level is around 6, nonoliguric.  Urine with actives sediment.  Anti-GBM, C3, C4, kappa lambda ratio, hep B, hep C negative.  ANA came back positive, will check anti-dsDNA level.  Started rituximab  and Solu-Medrol  first dose on 4/25.  Will will plan for Solu-Medrol  for 3 days and then prednisone  overall.  Plan for second dose of rituximab  in 2 weeks which needs to be arranged, I will consult pharmacist. Pepcid  and vitamin D  ordered. Strict ins and outs and daily lab. No urgent need for dialysis.  # Hypertension: holding ARB, on hydralazine . Added Coreg  12.5 bid.  Monitor BP. Lasix  as needed to manage volume.  #Hematuria: monomorphic rbc's noted in urine sediment. No casts.   #History of iron  deficiency anemia: Iron  saturation 21%, ferritin 154.  Noted mild allergic reaction to iron .  Order Aranesp  on 4/21.  Monitor hemoglobin.  #History of COPD #History of lung cancer 2009  Subjective: Seen and examined.  Urine output is measured as 1.1 L.  He tolerated rituximab  and Solu-Medrol  well.  Slept well last night.  Denies fever, nausea, vomiting, chest pain, shortness of breath.  Objective Vital signs in last 24 hours: Vitals:   05/05/23 1359 05/05/23 1948 05/06/23 0540 05/06/23 0710  BP: 128/75 (!) 147/76 (!) 153/71   Pulse: 65 65 68    Resp: 18 18 16    Temp: 97.9 F (36.6 C) (!) 97.5 F (36.4 C) 97.7 F (36.5 C)   TempSrc: Oral     SpO2: 94% 95% 96%   Weight:    81.4 kg  Height:       Weight change:   Intake/Output Summary (Last 24 hours) at 05/06/2023 0824 Last data filed at 05/06/2023 8119 Gross per 24 hour  Intake 720 ml  Output 1150 ml  Net -430 ml       Labs: RENAL PANEL Recent Labs  Lab 04/30/23 0402 05/01/23 0420 05/02/23 0427 05/03/23 0458 05/04/23 0443 05/05/23 0357 05/06/23 0445  NA 133* 132* 127* 131* 132* 130* 131*  K 3.9 4.1 4.1 4.0 3.9 3.7 4.1  CL 102 100 96* 100 100 97* 100  CO2 20* 24 21* 19* 20* 22 20*  GLUCOSE 99 98 96 94 99 100* 193*  BUN 45* 53* 59* 58* 62* 64* 70*  CREATININE 4.62* 4.97* 5.63* 5.89* 5.95* 6.20* 6.47*  CALCIUM  8.7* 8.5* 8.3* 8.8* 8.9 8.5* 8.6*  MG 2.2 2.1 2.0 1.8  --   --   --   PHOS 3.7  --   --  3.9  --   --  5.3*  ALBUMIN  2.8*  --  2.8*  --   --  2.8* 2.8*    Liver Function Tests: Recent Labs  Lab 05/02/23 0427 05/05/23 0357 05/06/23 0445  ALBUMIN  2.8* 2.8* 2.8*   No results for input(s): "LIPASE", "AMYLASE" in the last 168 hours. No results for input(s): "AMMONIA" in the last 168 hours. CBC: Recent Labs    08/05/22 0839 08/05/22 0840 11/24/22 1478  11/30/22 0909 11/30/22 0910 12/05/22 1104 12/12/22 1003 12/19/22 1046 01/16/23 0938 01/16/23 0939 02/08/23 0952 02/08/23 0953 03/29/23 1017 03/29/23 1018 04/27/23 1017 04/27/23 1018 04/27/23 1734 05/01/23 0420 05/02/23 0427 05/03/23 0458 05/04/23 0443 05/06/23 0445  HGB  --    < > 8.8* 7.7*  --    < > 9.2*   < >  --  9.8* 10.8*  --  11.8*  --  10.3*  --    < > 8.8* 9.3* 8.8* 8.9* 9.4*  MCV  --    < > 84 83.8  --    < > 83.2   < >  --  85.8 86.9  --  85.5  --  86.5  --    < > 89.4 89.3 90.6 89.2 88.1  VITAMINB12 719  --  747  --   --   --   --   --   --   --   --   --   --   --   --   --   --   --   --   --   --   --   FOLATE  --   --  14.9  --   --   --   --   --   --   --   --    --   --   --   --   --   --   --   --   --   --   --   FERRITIN 20*  --  228  --  155  --   --   --   --   --   --  94  --  122  --  154  --   --   --   --   --   --   TIBC  --    < > 247* 276  --   --   --   --   --  262 305  --  255  --  239*  --   --   --   --   --   --   --   IRON   --    < > 21* 12*  --   --   --   --   --  46 62  --  86  --  51  --   --   --   --   --   --   --   RETICCTPCT  --    < > 1.7 1.8  --   --  2.2  --  1.7  --   --  1.1  --  0.7 0.9  --   --   --   --   --   --   --    < > = values in this interval not displayed.    Cardiac Enzymes: No results for input(s): "CKTOTAL", "CKMB", "CKMBINDEX", "TROPONINI" in the last 168 hours. CBG: No results for input(s): "GLUCAP" in the last 168 hours.  Iron  Studies: No results for input(s): "IRON ", "TIBC", "TRANSFERRIN", "FERRITIN" in the last 72 hours. Studies/Results: No results found.   Medications: Infusions:  famotidine  (PEPCID ) IV     methylPREDNISolone  (SOLU-MEDROL ) injection 1,000 mg (05/05/23 0950)   sodium chloride       Scheduled Medications:  atorvastatin   40 mg Oral Daily   budeson-glycopyrrolate -formoterol   2 puff Inhalation BID   carvedilol   6.25  mg Oral BID WC   darbepoetin (ARANESP ) injection - NON-DIALYSIS  60 mcg Subcutaneous Q Mon-1800   DULoxetine   30 mg Oral Daily   famotidine   10 mg Oral Daily   hydrALAZINE   100 mg Oral BID   levothyroxine   175 mcg Oral Q0600   pantoprazole   40 mg Oral Daily   Vitamin D  (Ergocalciferol )  50,000 Units Oral Q7 days    have reviewed scheduled and prn medications.  Physical Exam: General: Able to lie flat comfortable, not in distress Heart:RRR, s1s2 nl Lungs: Clear bilateral, no wheeze or crackle Abdomen:soft, Non-tender, non-distended Extremities:No edema Neurology: Alert, awake and following commands.  Kennidee Heyne Alverta Avers Jahnyla Parrillo 05/06/2023,8:24 AM  LOS: 8 days

## 2023-05-06 NOTE — Progress Notes (Signed)
   05/06/23 2100  BiPAP/CPAP/SIPAP  BiPAP/CPAP/SIPAP Pt Type Adult  FiO2 (%) 21 %  Patient Home Machine Yes  Patient Home Mask Yes  Patient Home Tubing Yes  Device Plugged into RED Power Outlet Yes

## 2023-05-07 DIAGNOSIS — N019 Rapidly progressive nephritic syndrome with unspecified morphologic changes: Secondary | ICD-10-CM | POA: Diagnosis not present

## 2023-05-07 DIAGNOSIS — N179 Acute kidney failure, unspecified: Secondary | ICD-10-CM | POA: Diagnosis not present

## 2023-05-07 DIAGNOSIS — J9601 Acute respiratory failure with hypoxia: Secondary | ICD-10-CM | POA: Diagnosis not present

## 2023-05-07 LAB — CBC
HCT: 28.8 % — ABNORMAL LOW (ref 39.0–52.0)
Hemoglobin: 9.2 g/dL — ABNORMAL LOW (ref 13.0–17.0)
MCH: 28.7 pg (ref 26.0–34.0)
MCHC: 31.9 g/dL (ref 30.0–36.0)
MCV: 89.7 fL (ref 80.0–100.0)
Platelets: 265 10*3/uL (ref 150–400)
RBC: 3.21 MIL/uL — ABNORMAL LOW (ref 4.22–5.81)
RDW: 17.9 % — ABNORMAL HIGH (ref 11.5–15.5)
WBC: 12.4 10*3/uL — ABNORMAL HIGH (ref 4.0–10.5)
nRBC: 0 % (ref 0.0–0.2)

## 2023-05-07 LAB — GLUCOSE, CAPILLARY
Glucose-Capillary: 140 mg/dL — ABNORMAL HIGH (ref 70–99)
Glucose-Capillary: 174 mg/dL — ABNORMAL HIGH (ref 70–99)
Glucose-Capillary: 150 mg/dL — ABNORMAL HIGH (ref 70–99)
Glucose-Capillary: 197 mg/dL — ABNORMAL HIGH (ref 70–99)

## 2023-05-07 LAB — RENAL FUNCTION PANEL
Albumin: 2.7 g/dL — ABNORMAL LOW (ref 3.5–5.0)
Anion gap: 14 (ref 5–15)
BUN: 85 mg/dL — ABNORMAL HIGH (ref 8–23)
CO2: 17 mmol/L — ABNORMAL LOW (ref 22–32)
Calcium: 8.6 mg/dL — ABNORMAL LOW (ref 8.9–10.3)
Chloride: 102 mmol/L (ref 98–111)
Creatinine, Ser: 6.63 mg/dL — ABNORMAL HIGH (ref 0.61–1.24)
GFR, Estimated: 8 mL/min — ABNORMAL LOW (ref >60)
Glucose, Bld: 159 mg/dL — ABNORMAL HIGH (ref 70–99)
Phosphorus: 5.5 mg/dL — ABNORMAL HIGH (ref 2.5–4.6)
Potassium: 4.3 mmol/L (ref 3.5–5.1)
Sodium: 133 mmol/L — ABNORMAL LOW (ref 135–145)

## 2023-05-07 LAB — HEMOGLOBIN A1C
Hgb A1c MFr Bld: 5.7 % — ABNORMAL HIGH (ref 4.8–5.6)
Mean Plasma Glucose: 116.89 mg/dL

## 2023-05-07 MED ORDER — PREDNISONE 50 MG PO TABS
60.0000 mg | ORAL_TABLET | Freq: Every day | ORAL | Status: DC
  Administered 2023-05-08 – 2023-05-11 (×4): 60 mg via ORAL
  Filled 2023-05-07 (×4): qty 1

## 2023-05-07 MED ORDER — SULFAMETHOXAZOLE-TRIMETHOPRIM 800-160 MG PO TABS
1.0000 | ORAL_TABLET | ORAL | Status: DC
  Administered 2023-05-08 – 2023-05-10 (×2): 1 via ORAL
  Filled 2023-05-07 (×2): qty 1

## 2023-05-07 MED ORDER — SODIUM BICARBONATE 650 MG PO TABS
650.0000 mg | ORAL_TABLET | Freq: Three times a day (TID) | ORAL | Status: DC
  Administered 2023-05-07 – 2023-05-11 (×12): 650 mg via ORAL
  Filled 2023-05-07 (×13): qty 1

## 2023-05-07 MED ORDER — INSULIN ASPART 100 UNIT/ML IJ SOLN
0.0000 [IU] | Freq: Three times a day (TID) | INTRAMUSCULAR | Status: DC
  Administered 2023-05-07 – 2023-05-08 (×2): 3 [IU] via SUBCUTANEOUS
  Administered 2023-05-08: 2 [IU] via SUBCUTANEOUS
  Administered 2023-05-08: 3 [IU] via SUBCUTANEOUS
  Administered 2023-05-09: 2 [IU] via SUBCUTANEOUS
  Administered 2023-05-09 – 2023-05-11 (×2): 3 [IU] via SUBCUTANEOUS

## 2023-05-07 MED ORDER — MELATONIN 3 MG PO TABS
3.0000 mg | ORAL_TABLET | Freq: Every evening | ORAL | Status: AC | PRN
  Administered 2023-05-07 – 2023-05-10 (×4): 3 mg via ORAL
  Filled 2023-05-07 (×4): qty 1

## 2023-05-07 NOTE — Progress Notes (Signed)
 Pharmacy Note  pharmacy unable to arrange OP infusions  This was communicated to Dr. Edson Graces & Dr Kyle Pho via secure chat on 4/26 & acknowledged   Rubie Corona, Pharm.D Use secure chat for questions 05/07/2023 12:03 PM

## 2023-05-07 NOTE — Progress Notes (Signed)
   05/07/23 2230  BiPAP/CPAP/SIPAP  BiPAP/CPAP/SIPAP Pt Type Adult  FiO2 (%) 21 %  Patient Home Machine Yes  Safety Check Completed by RT for Home Unit Yes, no issues noted  Patient Home Mask Yes  Patient Home Tubing Yes  Device Plugged into RED Power Outlet Yes

## 2023-05-07 NOTE — Plan of Care (Signed)

## 2023-05-07 NOTE — Progress Notes (Signed)
 PROGRESS NOTE    Walter Wilkerson  ZOX:[id number] DOB: July 27, 1950 DOA: 04/27/2023 PCP: Araceli Knight, PA-C   Brief Narrative: Walter Wilkerson is a 73 y.o. male with a history of lung cancer s/p resection in remission, CKD stage IIIa, primary hypertension, hypothyroidism, emphysema, iron  deficiency anemia, chronic pain syndrome.  Patient presented secondary to abnormal outpatient labs with evidence of acute kidney injury.  Nephrology consulted for comanagement. 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   Assessment and Plan:  AKI on CKD stage IIIa Rapidly progressive glomerulonephritis, pauci-immune Baseline creatinine difficult to identify but patient's creatinine has been between 1.2 and 1.8 this year.  Creatinine of 5.10 on admission.  Nephrology consulted.  Patient initially managed with IV fluids and was seen to have some mild improvement in creatinine before worsening again. 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 Patient given Rituximab  on 4/25 and started on steroids. Creatinine continues to worsen. -Nephrology recommendations: solu-medrol   Microscopic hematuria Noted on urinalysis.  Acute respiratory failure with hypoxia Unclear etiology but possibly related to fluid overload. Patient requiring up to 2 L/min of supplemental oxygen. Chest x-ray not remarkable for clear etiology. Weaned to room air.  Hypomagnesemia Resolved with magnesium  supplementation.  Chronic bilateral LE edema Noted.  Primary hypertension -Continue carvedilol  and hydralazine   History of lung cancer Noted.  OSA -Continue CPAP nightly  Chronic pain syndrome Noted.  Normocytic anemia Stable.  Hypothyroidism -Continue Synthroid  125 mcg daily  GERD -Continue Protonix   Nausea -Continue Reglan  and Zofran  as needed  Hyperglycemia Likely steroid  induced. -Watch blood sugar  Vitamin D  deficiency -Continue vitamin D  supplementation   DVT prophylaxis: SCDs Code Status:   Code Status: Full Code Family Communication: None at bedside Disposition Plan: Discharge pending ongoing nephrology recommendations   Consultants:  Nephrology Interventional radiology  Procedures:  Ultrasound-guided left renal biopsy  Antimicrobials: None   Subjective: Feeling better overall. Unhappy that he'll need to remain admitted for a few more days.  Objective: BP (!) 148/75   Pulse 67   Temp 97.6 F (36.4 C)   Resp 18   Ht 5\' 8"  (1.727 m)   Wt 79.8 kg   SpO2 98%   BMI 26.75 kg/m   Examination:  General exam: Appears calm and comfortable Respiratory system: Respiratory effort normal. Central nervous system: Alert and oriented. Psychiatry: Judgement and insight appear normal. Mood & affect appropriate.    Data Reviewed: I have personally reviewed following labs and imaging studies  CBC Lab Results  Component Value Date   WBC 12.4 (H) 05/07/2023   RBC 3.21 (L) 05/07/2023   HGB 9.2 (L) 05/07/2023   HCT 28.8 (L) 05/07/2023   MCV 89.7 05/07/2023   MCH 28.7 05/07/2023   PLT 265 05/07/2023   MCHC 31.9 05/07/2023   RDW 17.9 (H) 05/07/2023   LYMPHSABS 0.7 04/27/2023   MONOABS 0.3 04/27/2023   EOSABS 0.1 04/27/2023   BASOSABS 0.1 04/27/2023     Last metabolic panel Lab Results  Component Value Date   NA 133 (L) 05/07/2023   K 4.3 05/07/2023   CL 102 05/07/2023   CO2 17 (L) 05/07/2023   BUN 85 (H) 05/07/2023   CREATININE 6.63 (H) 05/07/2023   GLUCOSE 159 (H) 05/07/2023   GFRNONAA 8 (L) 05/07/2023   GFRAA 83 03/04/2020   CALCIUM  8.6 (L) 05/07/2023   PHOS 5.5 (H) 05/07/2023   PROT 7.4 04/27/2023   ALBUMIN  2.7 (L)  05/07/2023   LABGLOB 3.3 04/30/2023   AGRATIO 0.8 04/30/2023   BILITOT 0.7 04/27/2023   ALKPHOS 66 04/27/2023   AST 15 04/27/2023   ALT 10 04/27/2023   ANIONGAP 14 05/07/2023    GFR: Estimated  Creatinine Clearance: 9.6 mL/min (A) (by C-G formula based on SCr of 6.63 mg/dL (H)).  Recent Results (from the past 240 hours)  Urine Culture     Status: None   Collection Time: 04/27/23  7:48 PM   Specimen: Urine, Clean Catch  Result Value Ref Range Status   Specimen Description   Final    URINE, CLEAN CATCH Performed at [facility], [address], [place], [place] [address]    Special Requests   Final    NONE Performed at [facility], [address], [place], [place] [address]    Culture   Final    NO GROWTH Performed at [facility], [address] [place], [place] [address]    Report Status 04/29/2023 FINAL  Final      Radiology Studies: No results found.     LOS: 9 days    Aneita Keens, MD [facility] 05/07/2023, 12:04 PM   If 7PM-7AM, please contact night-coverage [email or web address]

## 2023-05-07 NOTE — Progress Notes (Signed)
  [facility] PROGRESS [facility]  Assessment/ Plan: Pt is a 73 y.o. yo male with past medical history significant for lung cancer s/p resection, hypertension, hypothyroidism, emphysema, anemia on erythropoietin  injection as outpatient, CKD 3 presented with AKI found in outpatient lab.  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  Anti-GBM negative.  Global glomerulosclerosis 9 out of 35 glomeruli, segmental sclerosis absent, moderate IF, mild tubular atrophy, mild arterial intimal fibrosis and no arteriolar hyalinosis. Patient presented with AKI, creatinine level is around 6, nonoliguric.  Urine with actives sediment.  Anti-GBM, C3, C4, kappa lambda ratio, hep B, hep C negative.  ANA came back positive, will check anti-dsDNA level.  Started rituximab  and Solu-Medrol  first dose on 4/25.   Completing third dose of Solu-Medrol  today, starting prednisone  60 mg from tomorrow with plan to taper as outpatient. The patient will need second dose of rituximab  in 2 weeks, I communicated with pharmacist for possible arrangement.  I will consult care management team as well. Bactrim for PCP prophylaxis Pepcid  and vitamin D  ordered. Strict ins and outs and daily lab. No urgent need for dialysis.  # Hypertension: holding ARB, on hydralazine . Added Coreg  12.5 bid.  Monitor BP. Lasix  as needed to manage volume.  # Metabolic acidosis: Start sodium bicarbonate.  #Hematuria: monomorphic rbc's noted in urine sediment. No casts.   #History of iron  deficiency anemia: Iron  saturation 21%, ferritin 154.  Noted mild allergic reaction to iron .  Order Aranesp  on 4/21.  Monitor hemoglobin.  #History of COPD #History of lung cancer 2009  Subjective: Seen and examined.  Slept well and denies any complaint.  No nausea, vomiting, dysgeusia, chest pain or shortness of breath.  No new event. Objective Vital signs in  last 24 hours: Vitals:   05/06/23 1936 05/06/23 2052 05/06/23 2146 05/07/23 0541  BP: (!) 158/79  (!) 142/75 (!) 148/79  Pulse: 72   67  Resp: 18   18  Temp: 97.8 F (36.6 C)   97.6 F (36.4 C)  TempSrc:      SpO2: 99% 98%  97%  Weight:    79.8 kg  Height:       Weight change:   Intake/Output Summary (Last 24 hours) at 05/07/2023 0900 Last data filed at 05/07/2023 0533 Gross per 24 hour  Intake 285.49 ml  Output 625 ml  Net -339.51 ml       Labs: RENAL PANEL Recent Labs  Lab 05/01/23 0420 05/02/23 0427 05/03/23 0458 05/04/23 0443 05/05/23 0357 05/06/23 0445 05/07/23 0442  NA 132* 127* 131* 132* 130* 131* 133*  K 4.1 4.1 4.0 3.9 3.7 4.1 4.3  CL 100 96* 100 100 97* 100 102  CO2 24 21* 19* 20* 22 20* 17*  GLUCOSE 98 96 94 99 100* 193* 159*  BUN 53* 59* 58* 62* 64* 70* 85*  CREATININE 4.97* 5.63* 5.89* 5.95* 6.20* 6.47* 6.63*  CALCIUM  8.5* 8.3* 8.8* 8.9 8.5* 8.6* 8.6*  MG 2.1 2.0 1.8  --   --   --   --   PHOS  --   --  3.9  --   --  5.3* 5.5*  ALBUMIN   --  2.8*  --   --  2.8* 2.8* 2.7*    Liver Function Tests: Recent Labs  Lab 05/05/23 0357 05/06/23 0445 05/07/23 0442  ALBUMIN  2.8* 2.8* 2.7*   No results for input(s): "LIPASE", "AMYLASE" in the last 168 hours. No results for input(s): "AMMONIA" in  the last 168 hours. CBC: Recent Labs    08/05/22 0839 08/05/22 0840 11/24/22 0928 11/30/22 0909 11/30/22 0910 12/05/22 1104 12/12/22 1003 12/19/22 1046 01/16/23 0938 01/16/23 0939 02/08/23 0952 02/08/23 0953 03/29/23 1017 03/29/23 1018 04/27/23 1017 04/27/23 1018 04/27/23 1734 05/02/23 0427 05/03/23 0458 05/04/23 0443 05/06/23 0445 05/07/23 0442  HGB  --    < > 8.8* 7.7*  --    < > 9.2*   < >  --  9.8* 10.8*  --  11.8*  --  10.3*  --    < > 9.3* 8.8* 8.9* 9.4* 9.2*  MCV  --    < > 84 83.8  --    < > 83.2   < >  --  85.8 86.9  --  85.5  --  86.5  --    < > 89.3 90.6 89.2 88.1 89.7  VITAMINB12 719  --  747  --   --   --   --   --   --   --   --    --   --   --   --   --   --   --   --   --   --   --   FOLATE  --   --  14.9  --   --   --   --   --   --   --   --   --   --   --   --   --   --   --   --   --   --   --   FERRITIN 20*  --  228  --  155  --   --   --   --   --   --  94  --  122  --  154  --   --   --   --   --   --   TIBC  --    < > 247* 276  --   --   --   --   --  262 305  --  255  --  239*  --   --   --   --   --   --   --   IRON   --    < > 21* 12*  --   --   --   --   --  46 62  --  86  --  51  --   --   --   --   --   --   --   RETICCTPCT  --    < > 1.7 1.8  --   --  2.2  --  1.7  --   --  1.1  --  0.7 0.9  --   --   --   --   --   --   --    < > = values in this interval not displayed.    Cardiac Enzymes: No results for input(s): "CKTOTAL", "CKMB", "CKMBINDEX", "TROPONINI" in the last 168 hours. CBG: Recent Labs  Lab 05/06/23 1246 05/06/23 1702 05/06/23 2049 05/07/23 0758  GLUCAP 193* 163* 231* 140*    Iron  Studies: No results for input(s): "IRON ", "TIBC", "TRANSFERRIN", "FERRITIN" in the last 72 hours. Studies/Results: No results found.   Medications: Infusions:  methylPREDNISolone  (SOLU-MEDROL ) injection Stopped (05/06/23 1044)    Scheduled Medications:  atorvastatin   40 mg Oral Daily  budeson-glycopyrrolate -formoterol   2 puff Inhalation BID   carvedilol   6.25 mg Oral BID WC   darbepoetin (ARANESP ) injection - NON-DIALYSIS  60 mcg Subcutaneous Q Mon-1800   DULoxetine   30 mg Oral Daily   famotidine   10 mg Oral Daily   hydrALAZINE   100 mg Oral BID   insulin aspart  0-15 Units Subcutaneous TID WC   levothyroxine   175 mcg Oral Q0600   pantoprazole   40 mg Oral Daily   [START ON 05/08/2023] predniSONE   60 mg Oral Q breakfast   [START ON 05/08/2023] sulfamethoxazole-trimethoprim  1 tablet Oral Once per day on Monday Wednesday Friday   Vitamin D  (Ergocalciferol )  50,000 Units Oral Q7 days    have reviewed scheduled and prn medications.  Physical Exam: General: Able to lie flat comfortable, not in  distress Heart:RRR, s1s2 nl Lungs: Clear bilateral, no wheeze or crackle Abdomen:soft, Non-tender, non-distended Extremities:No edema Neurology: Alert, awake and following commands.  Walter Wilkerson 05/07/2023,9:00 AM  LOS: 9 days

## 2023-05-08 DIAGNOSIS — J9601 Acute respiratory failure with hypoxia: Secondary | ICD-10-CM | POA: Diagnosis not present

## 2023-05-08 DIAGNOSIS — N179 Acute kidney failure, unspecified: Secondary | ICD-10-CM | POA: Diagnosis not present

## 2023-05-08 DIAGNOSIS — N019 Rapidly progressive nephritic syndrome with unspecified morphologic changes: Secondary | ICD-10-CM | POA: Diagnosis not present

## 2023-05-08 LAB — GLUCOSE, CAPILLARY
Glucose-Capillary: 146 mg/dL — ABNORMAL HIGH (ref 70–99)
Glucose-Capillary: 190 mg/dL — ABNORMAL HIGH (ref 70–99)
Glucose-Capillary: 187 mg/dL — ABNORMAL HIGH (ref 70–99)
Glucose-Capillary: 148 mg/dL — ABNORMAL HIGH (ref 70–99)

## 2023-05-08 LAB — CBC
HCT: 28 % — ABNORMAL LOW (ref 39.0–52.0)
Hemoglobin: 9.3 g/dL — ABNORMAL LOW (ref 13.0–17.0)
MCH: 29.2 pg (ref 26.0–34.0)
MCHC: 33.2 g/dL (ref 30.0–36.0)
MCV: 88.1 fL (ref 80.0–100.0)
Platelets: 277 10*3/uL (ref 150–400)
RBC: 3.18 MIL/uL — ABNORMAL LOW (ref 4.22–5.81)
RDW: 18.2 % — ABNORMAL HIGH (ref 11.5–15.5)
WBC: 11.6 10*3/uL — ABNORMAL HIGH (ref 4.0–10.5)
nRBC: 0 % (ref 0.0–0.2)

## 2023-05-08 LAB — RENAL FUNCTION PANEL
Albumin: 2.8 g/dL — ABNORMAL LOW (ref 3.5–5.0)
Anion gap: 14 (ref 5–15)
BUN: 100 mg/dL — ABNORMAL HIGH (ref 8–23)
CO2: 18 mmol/L — ABNORMAL LOW (ref 22–32)
Calcium: 8.3 mg/dL — ABNORMAL LOW (ref 8.9–10.3)
Chloride: 100 mmol/L (ref 98–111)
Creatinine, Ser: 7.25 mg/dL — ABNORMAL HIGH (ref 0.61–1.24)
GFR, Estimated: 7 mL/min — ABNORMAL LOW (ref >60)
Glucose, Bld: 163 mg/dL — ABNORMAL HIGH (ref 70–99)
Phosphorus: 5.9 mg/dL — ABNORMAL HIGH (ref 2.5–4.6)
Potassium: 4.1 mmol/L (ref 3.5–5.1)
Sodium: 132 mmol/L — ABNORMAL LOW (ref 135–145)

## 2023-05-08 MED ORDER — SODIUM CHLORIDE 0.9 % IV BOLUS
1000.0000 mL | Freq: Once | INTRAVENOUS | Status: AC
  Administered 2023-05-08: 1000 mL via INTRAVENOUS

## 2023-05-08 NOTE — Plan of Care (Signed)
   Problem: Clinical Measurements: Goal: Cardiovascular complication will be avoided Outcome: Progressing   Problem: Activity: Goal: Risk for activity intolerance will decrease Outcome: Progressing   Problem: Nutrition: Goal: Adequate nutrition will be maintained Outcome: Progressing   Problem: Coping: Goal: Level of anxiety will decrease Outcome: Progressing   Problem: Elimination: Goal: Will not experience complications related to bowel motility Outcome: Progressing

## 2023-05-08 NOTE — Progress Notes (Signed)
 RT offered to assist pt w/placement of CPAP. Pt has home machine at bedside. Pt states he can do it himself and will do it when he is ready for bed.    05/08/23 2258  BiPAP/CPAP/SIPAP  BiPAP/CPAP/SIPAP Pt Type Adult  Dentures removed? Not applicable  FiO2 (%) 21 %  Patient Home Machine Yes  Safety Check Completed by RT for Home Unit Yes, no issues noted  Patient Home Mask Yes  Patient Home Tubing Yes  Device Plugged into RED Power Outlet Yes

## 2023-05-08 NOTE — Progress Notes (Signed)
 [facility] NEPHROLOGY PROGRESS NOTE  Assessment/ Plan: Pt is a 73 y.o. yo male with past medical history significant for lung cancer s/p resection, hypertension, hypothyroidism, emphysema, anemia on erythropoietin  injection as outpatient, CKD 3 presented with AKI found in outpatient lab.  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  Anti-GBM negative.  Global glomerulosclerosis 9 out of 35 glomeruli, segmental sclerosis absent, moderate IF, mild tubular atrophy, mild arterial intimal fibrosis and no arteriolar hyalinosis. Patient presented with AKI, creatinine level is around 6, nonoliguric.  Urine with actives sediment.  Anti-GBM, C3, C4, kappa lambda ratio, hep B, hep C negative.  ANA came back positive.  P-ANCA was positive 1:160, C-ANCA negative.  Started rituximab  and Solu-Medrol  on 4/25.   Completing third dose of Solu-Medrol  4/27, now taking prednisone  60 mg daily with plan to taper as outpatient. The patient will need second dose of rituximab  in 2 weeks, I communicated with pharmacist for possible arrangement.   Bactrim for PCP prophylaxis Pepcid  and vitamin D  ordered. Strict ins and outs and daily lab. Pt creat/ BUN up again today, but no sig uremic symptoms. Cont supportive care. Would like to avoid HD if possible.    # Hypertension: holding ARB, on hydralazine . Added Coreg  12.5 bid.  Monitor BP. BP good today. Vol on exam and wts are stable.   # Metabolic acidosis: Started sodium bicarbonate.  #Hematuria: monomorphic rbc's noted in urine sediment. No casts.   #History of iron  deficiency anemia: Iron  saturation 21%, ferritin 154.  Noted mild allergic reaction to iron .  Order Aranesp  on 4/21.  Monitor hemoglobin.  #History of COPD  #History of lung cancer 2009  Subjective: Seen and examined. No c/o today. Had long discussion of when to start dialysis. Not needed yet.    Objective Vital signs in last 24 hours: Vitals:   05/08/23 0447 05/08/23 0801 05/08/23 0825 05/08/23 1217  BP: (!) 142/78   139/74  Pulse: 69   68  Resp: 15   18  Temp: 98.3 F (36.8 C)   97.8 F (36.6 C)  TempSrc: Oral     SpO2: 95% 98%  95%  Weight:   80.9 kg   Height:         Physical Exam: General: Able to lie flat comfortable, not in distress Heart:RRR, s1s2 nl Lungs: Clear bilateral, no wheeze or crackle Abdomen:soft, Non-tender, non-distended Extremities:No edema Neurology: Alert, awake and following commands.  Larry Poag  MD  [facility] 05/08/2023, 4:49 PM  Recent Labs  Lab 05/07/23 0442 05/08/23 0353 05/08/23 0354  HGB 9.2*  --  9.3*  ALBUMIN  2.7* 2.8*  --   CALCIUM  8.6* 8.3*  --   PHOS 5.5* 5.9*  --   CREATININE 6.63* 7.25*  --   K 4.3 4.1  --     Inpatient medications:  atorvastatin   40 mg Oral Daily   budeson-glycopyrrolate -formoterol   2 puff Inhalation BID   carvedilol   6.25 mg Oral BID WC   darbepoetin (ARANESP ) injection - NON-DIALYSIS  60 mcg Subcutaneous Q Mon-1800   DULoxetine   30 mg Oral Daily   famotidine   10 mg Oral Daily   hydrALAZINE   100 mg Oral BID   insulin aspart  0-15 Units Subcutaneous TID WC   levothyroxine   175 mcg Oral Q0600   pantoprazole   40 mg Oral Daily   predniSONE   60 mg Oral Q breakfast   sodium bicarbonate  650 mg Oral TID   sulfamethoxazole-trimethoprim  1  tablet Oral Once per day on Monday Wednesday Friday   Vitamin D  (Ergocalciferol )  50,000 Units Oral Q7 days    acetaminophen  **OR** acetaminophen , guaiFENesin -dextromethorphan , ipratropium-albuterol , melatonin, metoCLOPramide  (REGLAN ) injection, ondansetron 

## 2023-05-08 NOTE — Progress Notes (Addendum)
 PROGRESS NOTE    Walter Wilkerson  [id number]:[id number] DOB: January 03, 1951 DOA: 04/27/2023 PCP: Araceli Knight, PA-C   Brief Narrative: Walter Wilkerson is a 73 y.o. male with a history of lung cancer s/p resection in remission, CKD stage IIIa, primary hypertension, hypothyroidism, emphysema, iron  deficiency anemia, chronic pain syndrome.  Patient presented secondary to abnormal outpatient labs with evidence of acute kidney injury.  Nephrology consulted for comanagement. 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   Assessment and Plan:  AKI on CKD stage IIIa Rapidly progressive glomerulonephritis, pauci-immune Baseline creatinine difficult to identify but patient's creatinine has been between 1.2 and 1.8 this year.  Creatinine of 5.10 on admission.  Nephrology consulted.  Patient initially managed with IV fluids and was seen to have some mild improvement in creatinine before worsening again. 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 Patient given Rituximab  on 4/25 and started on Solu-medrol , now transitioned to prednisone . Creatinine continues to worsen. -Nephrology recommendations: prednisone , Bactrim  Microscopic hematuria Noted on urinalysis.  Acute respiratory failure with hypoxia Unclear etiology but possibly related to fluid overload. Patient requiring up to 2 L/min of supplemental oxygen. Chest x-ray not remarkable for clear etiology. Weaned to room air.  Hypomagnesemia Resolved with magnesium  supplementation.  Chronic bilateral LE edema Noted.  Primary hypertension -Continue carvedilol  and hydralazine   History of lung cancer Noted.  OSA -Continue CPAP nightly  Chronic pain syndrome Noted.  Normocytic anemia Stable.  Hypothyroidism -Continue Synthroid  125 mcg daily  GERD -Continue Protonix   Nausea -Continue Reglan  and Zofran  as  needed  Hyperglycemia Likely steroid induced. -Watch blood sugar  Vitamin D  deficiency -Continue vitamin D  supplementation   DVT prophylaxis: SCDs Code Status:   Code Status: Full Code Family Communication: None at bedside Disposition Plan: Discharge pending ongoing nephrology recommendations   Consultants:  Nephrology Interventional radiology  Procedures:  Ultrasound-guided left renal biopsy  Antimicrobials: Bactrim   Subjective: Patient is unhappy today. States he has been in the hospital for nearly 2 weeks and we are not helping his kidney disease. Concerned about toxin buildup in his bloodstream. Did not have anything else to share with me.  Objective: BP (!) 142/78 (BP Location: Left Arm)   Pulse 69   Temp 98.3 F (36.8 C) (Oral)   Resp 15   Ht 5\' 8"  (1.727 m)   Wt 80.9 kg   SpO2 98%   BMI 27.13 kg/m   Examination:  General exam: Appears somewhat agitated, but otherwise comfortable Respiratory system: Respiratory effort normal. Central nervous system: Alert and oriented. Psychiatry: Judgement and insight appear normal.   Data Reviewed: I have personally reviewed following labs and imaging studies  CBC Lab Results  Component Value Date   WBC 11.6 (H) 05/08/2023   RBC 3.18 (L) 05/08/2023   HGB 9.3 (L) 05/08/2023   HCT 28.0 (L) 05/08/2023   MCV 88.1 05/08/2023   MCH 29.2 05/08/2023   PLT 277 05/08/2023   MCHC 33.2 05/08/2023   RDW 18.2 (H) 05/08/2023   LYMPHSABS 0.7 04/27/2023   MONOABS 0.3 04/27/2023   EOSABS 0.1 04/27/2023   BASOSABS 0.1 04/27/2023     Last metabolic panel Lab Results  Component Value Date   NA 132 (L) 05/08/2023   K 4.1 05/08/2023   CL 100 05/08/2023   CO2 18 (L) 05/08/2023   BUN 100 (H) 05/08/2023   CREATININE 7.25 (H) 05/08/2023   GLUCOSE 163 (H) 05/08/2023  GFRNONAA 7 (L) 05/08/2023   GFRAA 83 03/04/2020   CALCIUM  8.3 (L) 05/08/2023   PHOS 5.9 (H) 05/08/2023   PROT 7.4 04/27/2023   ALBUMIN  2.8 (L)  05/08/2023   LABGLOB 3.3 04/30/2023   AGRATIO 0.8 04/30/2023   BILITOT 0.7 04/27/2023   ALKPHOS 66 04/27/2023   AST 15 04/27/2023   ALT 10 04/27/2023   ANIONGAP 14 05/08/2023    GFR: Estimated Creatinine Clearance: 8.8 mL/min (A) (by C-G formula based on SCr of 7.25 mg/dL (H)).  No results found for this or any previous visit (from the past 240 hours).     Radiology Studies: No results found.     LOS: 10 days    Aneita Keens, MD [facility] 05/08/2023, 9:31 AM   If 7PM-7AM, please contact night-coverage [email or web address]

## 2023-05-09 DIAGNOSIS — N019 Rapidly progressive nephritic syndrome with unspecified morphologic changes: Secondary | ICD-10-CM | POA: Diagnosis not present

## 2023-05-09 DIAGNOSIS — N179 Acute kidney failure, unspecified: Secondary | ICD-10-CM | POA: Diagnosis not present

## 2023-05-09 LAB — GLUCOSE, CAPILLARY
Glucose-Capillary: 120 mg/dL — ABNORMAL HIGH (ref 70–99)
Glucose-Capillary: 181 mg/dL — ABNORMAL HIGH (ref 70–99)
Glucose-Capillary: 127 mg/dL — ABNORMAL HIGH (ref 70–99)
Glucose-Capillary: 168 mg/dL — ABNORMAL HIGH (ref 70–99)

## 2023-05-09 LAB — RENAL FUNCTION PANEL
Albumin: 2.7 g/dL — ABNORMAL LOW (ref 3.5–5.0)
Anion gap: 13 (ref 5–15)
BUN: 115 mg/dL — ABNORMAL HIGH (ref 8–23)
CO2: 19 mmol/L — ABNORMAL LOW (ref 22–32)
Calcium: 8.5 mg/dL — ABNORMAL LOW (ref 8.9–10.3)
Chloride: 104 mmol/L (ref 98–111)
Creatinine, Ser: 7.31 mg/dL — ABNORMAL HIGH (ref 0.61–1.24)
GFR, Estimated: 7 mL/min — ABNORMAL LOW (ref >60)
Glucose, Bld: 138 mg/dL — ABNORMAL HIGH (ref 70–99)
Phosphorus: 6.2 mg/dL — ABNORMAL HIGH (ref 2.5–4.6)
Potassium: 4.2 mmol/L (ref 3.5–5.1)
Sodium: 136 mmol/L (ref 135–145)

## 2023-05-09 LAB — CBC
HCT: 28.8 % — ABNORMAL LOW (ref 39.0–52.0)
Hemoglobin: 9.4 g/dL — ABNORMAL LOW (ref 13.0–17.0)
MCH: 29.3 pg (ref 26.0–34.0)
MCHC: 32.6 g/dL (ref 30.0–36.0)
MCV: 89.7 fL (ref 80.0–100.0)
Platelets: 251 10*3/uL (ref 150–400)
RBC: 3.21 MIL/uL — ABNORMAL LOW (ref 4.22–5.81)
RDW: 18.4 % — ABNORMAL HIGH (ref 11.5–15.5)
WBC: 10.9 10*3/uL — ABNORMAL HIGH (ref 4.0–10.5)
nRBC: 0 % (ref 0.0–0.2)

## 2023-05-09 LAB — ANTI-DNA ANTIBODY, DOUBLE-STRANDED: ds DNA Ab: 150 [IU]/mL — ABNORMAL HIGH (ref 0–9)

## 2023-05-09 MED ORDER — VITAMIN D 25 MCG (1000 UNIT) PO TABS
1000.0000 [IU] | ORAL_TABLET | Freq: Every day | ORAL | Status: DC
Start: 2023-05-09 — End: 2023-05-10
  Administered 2023-05-09 – 2023-05-10 (×2): 1000 [IU] via ORAL
  Filled 2023-05-09 (×2): qty 1

## 2023-05-09 NOTE — Progress Notes (Signed)
 PROGRESS NOTE    Rennie Galinato  ZOX:[id number] DOB: 1950/08/13 DOA: 04/27/2023 PCP: Araceli Knight, PA-C   Brief Narrative: Walter Wilkerson is a 73 y.o. male with a history of lung cancer s/p resection in remission, CKD stage IIIa, primary hypertension, hypothyroidism, emphysema, iron  deficiency anemia, chronic pain syndrome.  Patient presented secondary to abnormal outpatient labs with evidence of acute kidney injury.  Nephrology consulted for comanagement. 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   Assessment and Plan:  AKI on CKD stage IIIa Rapidly progressive glomerulonephritis, pauci-immune Baseline creatinine difficult to identify but patient's creatinine has been between 1.2 and 1.8 this year.  Creatinine of 5.10 on admission.  Nephrology consulted.  Patient initially managed with IV fluids and was seen to have some mild improvement in creatinine before worsening again. 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 Patient given Rituximab  on 4/25 and started on Solu-medrol , now transitioned to prednisone  with plan for eventual taper. Creatinine continues to worsen. -Nephrology recommendations: prednisone , Bactrim for PCP prophylaxis  Microscopic hematuria Noted on urinalysis.  Acute respiratory failure with hypoxia Unclear etiology but possibly related to fluid overload. Patient requiring up to 2 L/min of supplemental oxygen. Chest x-ray not remarkable for clear etiology. Weaned to room air.  Hypomagnesemia Resolved with magnesium  supplementation.  Chronic bilateral LE edema Noted.  Primary hypertension -Continue carvedilol  and hydralazine   History of lung cancer Noted.  OSA -Continue CPAP nightly  Chronic pain syndrome Noted.  Normocytic anemia Stable.  Hypothyroidism -Continue Synthroid  125 mcg daily  GERD -Continue  Protonix   Nausea -Continue Reglan  and Zofran  as needed  Hyperglycemia Likely steroid induced. -Watch blood sugar  Vitamin D  deficiency -Continue vitamin D  supplementation   DVT prophylaxis: SCDs Code Status:   Code Status: Full Code Family Communication: None at bedside Disposition Plan: Discharge pending ongoing nephrology recommendations   Consultants:  Nephrology Interventional radiology  Procedures:  Ultrasound-guided left renal biopsy  Antimicrobials: Bactrim   Subjective: Patient feels better today. More understanding of his situation. Hopeful his renal function will improve without the need for hemodialysis.  Objective: BP (!) 147/75 (BP Location: Right Arm)   Pulse 66   Temp 97.7 F (36.5 C)   Resp 20   Ht 5\' 8"  (1.727 m)   Wt 83.8 kg   SpO2 94%   BMI 28.09 kg/m   Examination:  General exam: Appears calm and comfortable Respiratory system: Respiratory effort normal. Central nervous system: Alert and oriented.  Psychiatry: Judgement and insight appear normal. Mood & affect appropriate.    Data Reviewed: I have personally reviewed following labs and imaging studies  CBC Lab Results  Component Value Date   WBC 10.9 (H) 05/09/2023   RBC 3.21 (L) 05/09/2023   HGB 9.4 (L) 05/09/2023   HCT 28.8 (L) 05/09/2023   MCV 89.7 05/09/2023   MCH 29.3 05/09/2023   PLT 251 05/09/2023   MCHC 32.6 05/09/2023   RDW 18.4 (H) 05/09/2023   LYMPHSABS 0.7 04/27/2023   MONOABS 0.3 04/27/2023   EOSABS 0.1 04/27/2023   BASOSABS 0.1 04/27/2023     Last metabolic panel Lab Results  Component Value Date   NA 136 05/09/2023   K 4.2 05/09/2023   CL 104 05/09/2023   CO2 19 (L) 05/09/2023   BUN 115 (H) 05/09/2023   CREATININE 7.31 (H) 05/09/2023   GLUCOSE 138 (H) 05/09/2023   GFRNONAA 7 (L) 05/09/2023   GFRAA 83 03/04/2020  CALCIUM  8.5 (L) 05/09/2023   PHOS 6.2 (H) 05/09/2023   PROT 7.4 04/27/2023   ALBUMIN  2.7 (L) 05/09/2023   LABGLOB 3.3 04/30/2023    AGRATIO 0.8 04/30/2023   BILITOT 0.7 04/27/2023   ALKPHOS 66 04/27/2023   AST 15 04/27/2023   ALT 10 04/27/2023   ANIONGAP 13 05/09/2023    GFR: Estimated Creatinine Clearance: 9.5 mL/min (A) (by C-G formula based on SCr of 7.31 mg/dL (H)).  No results found for this or any previous visit (from the past 240 hours).     Radiology Studies: No results found.     LOS: 11 days    Aneita Keens, MD [facility] 05/09/2023, 9:49 AM   If 7PM-7AM, please contact night-coverage [email or web address]

## 2023-05-09 NOTE — Progress Notes (Signed)
 [facility] NEPHROLOGY PROGRESS NOTE  Assessment/ Plan: Pt is a 73 y.o. yo male with past medical history significant for lung cancer s/p resection, hypertension, hypothyroidism, emphysema, anemia on erythropoietin  injection as outpatient, CKD 3 presented with AKI found in outpatient lab.  # Rapidly progressing glomerulonephritis/RPGN: Patient presented with AKI, creatinine level is around 6, nonoliguric.  Urine with active sediment.  Anti-GBM, C3, C4, kappa lambda ratio, hep B, hep C were negative.  ANA came back positive. P-ANCA was positive 1:160, C-ANCA negative. The biopsy showed crescentic glomerulonephritis, pauci-immune type, P- ANCA positive.  Anti-GBM negative.  Global glomerulosclerosis 9 out of 35 glomeruli, segmental sclerosis absent, moderate IF, mild tubular atrophy, mild arterial intimal fibrosis and no arteriolar hyalinosis. Treatment as follows:   S/P rituximab  #1 on 4/25 S/P high dose Solu-Medrol  1,000mg  IV daily on 4/25, 4/26 and 4/27   Now on prednisone  60 mg daily (taper as outpatient) Will need second dose of rituximab  in 2 wks from 1st dose (May 9th) Bactrim for PCP prophylaxis Pepcid  x 5 days ordered, also vit D  BUN/ Cr today may be leveling off; not uremic, try to avoid HD if possible  F/u labs in am  # Hypertension: holding ARB, on hydralazine . Added Coreg  12.5 bid.  Monitor BP. BP good today. Vol on exam and wts are stable.   # Metabolic acidosis: Started sodium bicarbonate.  #Hematuria: monomorphic rbc's noted in urine sediment. No casts.   #History of iron  deficiency anemia: Iron  saturation 21%, ferritin 154.  Noted mild allergic reaction to iron .  Order Aranesp  on 4/21.  Monitor hemoglobin.  #History of COPD  #History of lung cancer 2009  Subjective: Seen and examined. No c/o today. No uremic issues. Creat up just a bit, 7.2- > 7.3 today.   Objective Vital signs in last 24 hours: Vitals:   05/08/23 2021 05/09/23 0423 05/09/23 0500  05/09/23 1135  BP: (!) 146/75 (!) 147/75  (!) 159/76  Pulse: 68 66  66  Resp: 18 20  18   Temp: 97.8 F (36.6 C) 97.7 F (36.5 C)  97.8 F (36.6 C)  TempSrc:      SpO2: 96% 94%  98%  Weight:   83.8 kg   Height:         Physical Exam: General: Able to lie flat comfortable, not in distress Heart:RRR, s1s2 nl Lungs: Clear bilateral, no wheeze or crackle Abdomen:soft, Non-tender, non-distended Extremities:No edema Neurology: Alert, awake and following commands.  Larry Poag  MD  [facility] 05/09/2023, 1:05 PM  Recent Labs  Lab 05/08/23 0353 05/08/23 0354 05/09/23 0414  HGB  --  9.3* 9.4*  ALBUMIN  2.8*  --  2.7*  CALCIUM  8.3*  --  8.5*  PHOS 5.9*  --  6.2*  CREATININE 7.25*  --  7.31*  K 4.1  --  4.2    Inpatient medications:  atorvastatin   40 mg Oral Daily   budeson-glycopyrrolate -formoterol   2 puff Inhalation BID   carvedilol   6.25 mg Oral BID WC   darbepoetin (ARANESP ) injection - NON-DIALYSIS  60 mcg Subcutaneous Q Mon-1800   DULoxetine   30 mg Oral Daily   hydrALAZINE   100 mg Oral BID   insulin aspart  0-15 Units Subcutaneous TID WC   levothyroxine   175 mcg Oral Q0600   pantoprazole   40 mg Oral Daily   predniSONE   60 mg Oral Q breakfast   sodium bicarbonate  650 mg Oral TID   sulfamethoxazole-trimethoprim  1 tablet Oral Once per day on Monday Wednesday  Friday   Vitamin D  (Ergocalciferol )  50,000 Units Oral Q7 days    acetaminophen  **OR** acetaminophen , guaiFENesin -dextromethorphan , ipratropium-albuterol , melatonin, metoCLOPramide  (REGLAN ) injection, ondansetron 

## 2023-05-09 NOTE — Plan of Care (Signed)

## 2023-05-10 DIAGNOSIS — N179 Acute kidney failure, unspecified: Secondary | ICD-10-CM | POA: Diagnosis not present

## 2023-05-10 LAB — RENAL FUNCTION PANEL
Albumin: 2.7 g/dL — ABNORMAL LOW (ref 3.5–5.0)
Anion gap: 12 (ref 5–15)
BUN: 116 mg/dL — ABNORMAL HIGH (ref 8–23)
CO2: 18 mmol/L — ABNORMAL LOW (ref 22–32)
Calcium: 8.3 mg/dL — ABNORMAL LOW (ref 8.9–10.3)
Chloride: 104 mmol/L (ref 98–111)
Creatinine, Ser: 7.36 mg/dL — ABNORMAL HIGH (ref 0.61–1.24)
GFR, Estimated: 7 mL/min — ABNORMAL LOW (ref >60)
Glucose, Bld: 135 mg/dL — ABNORMAL HIGH (ref 70–99)
Phosphorus: 6.3 mg/dL — ABNORMAL HIGH (ref 2.5–4.6)
Potassium: 4.1 mmol/L (ref 3.5–5.1)
Sodium: 134 mmol/L — ABNORMAL LOW (ref 135–145)

## 2023-05-10 LAB — GLUCOSE, CAPILLARY
Glucose-Capillary: 107 mg/dL — ABNORMAL HIGH (ref 70–99)
Glucose-Capillary: 136 mg/dL — ABNORMAL HIGH (ref 70–99)
Glucose-Capillary: 125 mg/dL — ABNORMAL HIGH (ref 70–99)
Glucose-Capillary: 189 mg/dL — ABNORMAL HIGH (ref 70–99)

## 2023-05-10 LAB — CBC
HCT: 31.4 % — ABNORMAL LOW (ref 39.0–52.0)
Hemoglobin: 10.1 g/dL — ABNORMAL LOW (ref 13.0–17.0)
MCH: 29.2 pg (ref 26.0–34.0)
MCHC: 32.2 g/dL (ref 30.0–36.0)
MCV: 90.8 fL (ref 80.0–100.0)
Platelets: 255 10*3/uL (ref 150–400)
RBC: 3.46 MIL/uL — ABNORMAL LOW (ref 4.22–5.81)
RDW: 18.7 % — ABNORMAL HIGH (ref 11.5–15.5)
WBC: 10.7 10*3/uL — ABNORMAL HIGH (ref 4.0–10.5)
nRBC: 0 % (ref 0.0–0.2)

## 2023-05-10 NOTE — Plan of Care (Signed)

## 2023-05-10 NOTE — Progress Notes (Signed)
 PROGRESS NOTE    Walter Wilkerson  GMW:[id number] DOB: Nov 29, 1950 DOA: 04/27/2023 PCP: Walter Knight, PA-C  Chief Complaint  Patient presents with   Abnormal Labs    Brief Narrative:   Walter Wilkerson is Walter Wilkerson 73 y.o. male with Walter Wilkerson history of lung cancer s/p resection in remission, CKD stage IIIa, primary hypertension, hypothyroidism, emphysema, iron  deficiency anemia, chronic pain syndrome. Patient presented secondary to abnormal outpatient labs with evidence of acute kidney injury. Nephrology consulted for comanagement. 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   Assessment & Plan:   Principal Problem:   AKI (acute kidney injury) (HCC) Active Problems:   Acute kidney injury (HCC)  AKI on CKD stage IIIa Rapidly progressive glomerulonephritis, pauci-immune Baseline creatinine difficult to identify but patient's creatinine has been between 1.2 and 1.8 this year.  Creatinine of 5.10 on admission.  Nephrology consulted.  Patient initially managed with IV fluids and was seen to have some mild improvement in creatinine before worsening again. 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 Patient given Rituximab  on 4/25 and started on Solu-medrol , now transitioned to prednisone  with plan for eventual taper. Creatinine continues to worsen. -Nephrology recommendations: prednisone , Bactrim for PCP prophylaxis - appreciate ongoing renal management   Microscopic hematuria Noted on urinalysis.   Acute respiratory failure with hypoxia Unclear etiology but possibly related to fluid overload. Patient requiring up to 2 L/min of supplemental oxygen. Chest x-ray not remarkable for clear etiology. Weaned to room air.  Resolved at this time.   Hypomagnesemia Resolved with magnesium  supplementation.   Chronic bilateral LE edema Noted.   Primary hypertension -Continue  carvedilol  and hydralazine    History of lung cancer Noted.   OSA -Continue CPAP nightly   Chronic pain syndrome Noted.   Normocytic anemia Stable.   Hypothyroidism -Continue Synthroid  125 mcg daily   GERD -Continue Protonix    Nausea -Continue Reglan  and Zofran  as needed   Hyperglycemia Likely steroid induced. -Watch blood sugar   Vitamin D  deficiency -Continue vitamin D  supplementation    DVT prophylaxis: SCD Code Status: full Family Communication: none Disposition:   Status is: Inpatient Remains inpatient appropriate because: ongoing inpatient care   Consultants:  Renal IR  Procedures:  4/23 ultrasound-guided left renal biopsy.   Antimicrobials:  Anti-infectives (From admission, onward)    Start     Dose/Rate Route Frequency Ordered Stop   05/08/23 0900  sulfamethoxazole-trimethoprim (BACTRIM DS) 800-160 MG per tablet 1 tablet        1 tablet Oral Once per day on Monday Wednesday Friday 05/07/23 0859         Subjective: No new complaints  Objective: Vitals:   05/10/23 1056 05/10/23 1100 05/10/23 1201 05/10/23 1643  BP: (!) 161/85 (!) 161/84 (!) 151/83 (!) 160/80  Pulse: 66  68 65  Resp:   16   Temp:   98 F (36.7 C)   TempSrc:      SpO2:   97%   Weight:      Height:        Intake/Output Summary (Last 24 hours) at 05/10/2023 1857 Last data filed at 05/10/2023 1800 Gross per 24 hour  Intake 360 ml  Output --  Net 360 ml   Filed Weights   05/08/23 0825 05/09/23 0500 05/10/23 0500  Weight: 80.9 kg 83.8 kg 84 kg    Examination:  General exam: Appears calm and comfortable  Respiratory system:  unlabored Cardiovascular system:  RRR Gastrointestinal system: Abdomen is nondistended, soft and nontender. Central nervous system: Alert and oriented. No focal neurological deficits. Extremities: no LEE   Data Reviewed: I have personally reviewed following labs and imaging studies  CBC: Recent Labs  Lab 05/06/23 0445 05/07/23 0442  05/08/23 0354 05/09/23 0414 05/10/23 0440  WBC 5.1 12.4* 11.6* 10.9* 10.7*  HGB 9.4* 9.2* 9.3* 9.4* 10.1*  HCT 28.2* 28.8* 28.0* 28.8* 31.4*  MCV 88.1 89.7 88.1 89.7 90.8  PLT 226 265 277 251 255    Basic Metabolic Panel: Recent Labs  Lab 05/06/23 0445 05/07/23 0442 05/08/23 0353 05/09/23 0414 05/10/23 0440  NA 131* 133* 132* 136 134*  K 4.1 4.3 4.1 4.2 4.1  CL 100 102 100 104 104  CO2 20* 17* 18* 19* 18*  GLUCOSE 193* 159* 163* 138* 135*  BUN 70* 85* 100* 115* 116*  CREATININE 6.47* 6.63* 7.25* 7.31* 7.36*  CALCIUM  8.6* 8.6* 8.3* 8.5* 8.3*  PHOS 5.3* 5.5* 5.9* 6.2* 6.3*    GFR: Estimated Creatinine Clearance: 9.4 mL/min (Walter Wilkerson) (by C-G formula based on SCr of 7.36 mg/dL (H)).  Liver Function Tests: Recent Labs  Lab 05/06/23 0445 05/07/23 0442 05/08/23 0353 05/09/23 0414 05/10/23 0440  ALBUMIN  2.8* 2.7* 2.8* 2.7* 2.7*    CBG: Recent Labs  Lab 05/09/23 1618 05/09/23 2000 05/10/23 0756 05/10/23 1202 05/10/23 1617  GLUCAP 127* 168* 107* 136* 125*     No results found for this or any previous visit (from the past 240 hours).       Radiology Studies: No results found.      Scheduled Meds:  atorvastatin   40 mg Oral Daily   budeson-glycopyrrolate -formoterol   2 puff Inhalation BID   carvedilol   6.25 mg Oral BID WC   darbepoetin (ARANESP ) injection - NON-DIALYSIS  60 mcg Subcutaneous Q Mon-1800   DULoxetine   30 mg Oral Daily   hydrALAZINE   100 mg Oral BID   insulin aspart  0-15 Units Subcutaneous TID WC   levothyroxine   175 mcg Oral Q0600   pantoprazole   40 mg Oral Daily   predniSONE   60 mg Oral Q breakfast   sodium bicarbonate  650 mg Oral TID   sulfamethoxazole-trimethoprim  1 tablet Oral Once per day on Monday Wednesday Friday   Vitamin D  (Ergocalciferol )  50,000 Units Oral Q7 days   Continuous Infusions:   LOS: 12 days    Time spent: over 30 min    Walter Gains, MD [facility]   To contact the attending provider between  7A-7P or the covering provider during after hours 7P-7A, please log into the web site [email or web address] and access using universal [place] password for that web site. If you do not have the password, please call the hospital operator.  05/10/2023, 6:57 PM

## 2023-05-10 NOTE — Progress Notes (Signed)
   05/10/23 2259  BiPAP/CPAP/SIPAP  BiPAP/CPAP/SIPAP Pt Type Adult  BiPAP/CPAP/SIPAP Resmed  Mask Type Full face mask  Dentures removed? Not applicable  Mask Size Medium  FiO2 (%) 21 %  Patient Home Machine Yes  Safety Check Completed by RT for Home Unit Yes, no issues noted  Patient Home Mask Yes  Patient Home Tubing Yes  Auto Titrate  (home settings)  Device Plugged into [employer] Power Outlet Yes

## 2023-05-10 NOTE — Progress Notes (Signed)
 [facility] NEPHROLOGY PROGRESS NOTE  Assessment/ Plan: Pt is a 73 y.o. yo male with past medical history significant for lung cancer s/p resection, hypertension, hypothyroidism, emphysema, anemia on erythropoietin  injection as outpatient, CKD 3 presented with AKI found in outpatient lab.  # Rapidly progressing glomerulonephritis/RPGN: Patient presented with AKI, creatinine level is around 6, nonoliguric.  Urine with active sediment.  Anti-GBM, C3, C4, kappa lambda ratio, hep B, hep C were negative.  ANA came back positive. P-ANCA was positive 1:160, C-ANCA negative. The biopsy showed crescentic glomerulonephritis, pauci-immune type, P- ANCA positive.  Anti-GBM negative.  Global glomerulosclerosis 9 out of 35 glomeruli, segmental sclerosis absent, moderate IF, mild tubular atrophy, mild arterial intimal fibrosis and no arteriolar hyalinosis. Treatment as follows:   S/P rituximab  #1 on 4/25 S/P high dose Solu-Medrol  1,000mg  IV daily x 3 on 4/25, 4/26 and 4/27   Now on prednisone  60 mg daily (taper as outpatient) Will need second dose of rituximab  in about  2 wks from 1st dose (May 9th) Bactrim for PCP prophylaxis Pepcid  x 5 days ordered, also vit D  BUN/ Cr today may be leveling off Cont supportive care, d/w patient in detail  # Hypertension: holding ARB. Cont coreg  and hydralazine . BP good. Vol on exam and wts are stable.   # Metabolic acidosis: Started sodium bicarbonate.  #History of iron  deficiency anemia: Iron  saturation 21%, ferritin 154.  Noted mild allergic reaction to iron .  Order Aranesp  on 4/21.  Monitor hemoglobin.  #History of COPD  #History of lung cancer 2009  Subjective: Seen and examined. No c/o today. No uremic issues. Creat up just a bit, 7.2- > 7.3 today.   Objective Vital signs in last 24 hours: Vitals:   05/10/23 0944 05/10/23 1056 05/10/23 1100 05/10/23 1201  BP: (!) 171/90 (!) 161/85 (!) 161/84 (!) 151/83  Pulse: 67 66  68  Resp:    16  Temp:     98 F (36.7 C)  TempSrc:      SpO2:    97%  Weight:      Height:         Physical Exam: General: Able to lie flat comfortable, not in distress Heart:RRR, s1s2 nl Lungs: Clear bilateral, no wheeze or crackle Abdomen:soft, Non-tender, non-distended Extremities:No edema Neurology: Alert, awake and following commands.  Larry Poag  MD  [facility] 05/10/2023, 2:23 PM  Recent Labs  Lab 05/09/23 0414 05/10/23 0440  HGB 9.4* 10.1*  ALBUMIN  2.7* 2.7*  CALCIUM  8.5* 8.3*  PHOS 6.2* 6.3*  CREATININE 7.31* 7.36*  K 4.2 4.1    Inpatient medications:  atorvastatin   40 mg Oral Daily   budeson-glycopyrrolate -formoterol   2 puff Inhalation BID   carvedilol   6.25 mg Oral BID WC   cholecalciferol  1,000 Units Oral Daily   darbepoetin (ARANESP ) injection - NON-DIALYSIS  60 mcg Subcutaneous Q Mon-1800   DULoxetine   30 mg Oral Daily   hydrALAZINE   100 mg Oral BID   insulin aspart  0-15 Units Subcutaneous TID WC   levothyroxine   175 mcg Oral Q0600   pantoprazole   40 mg Oral Daily   predniSONE   60 mg Oral Q breakfast   sodium bicarbonate  650 mg Oral TID   sulfamethoxazole-trimethoprim  1 tablet Oral Once per day on Monday Wednesday Friday   Vitamin D  (Ergocalciferol )  50,000 Units Oral Q7 days    acetaminophen  **OR** acetaminophen , guaiFENesin -dextromethorphan , ipratropium-albuterol , melatonin, metoCLOPramide  (REGLAN ) injection, ondansetron 

## 2023-05-11 ENCOUNTER — Other Ambulatory Visit (HOSPITAL_COMMUNITY): Payer: Self-pay

## 2023-05-11 ENCOUNTER — Encounter: Payer: Self-pay | Admitting: Hematology & Oncology

## 2023-05-11 DIAGNOSIS — N179 Acute kidney failure, unspecified: Secondary | ICD-10-CM | POA: Diagnosis not present

## 2023-05-11 LAB — RENAL FUNCTION PANEL
Albumin: 2.5 g/dL — ABNORMAL LOW (ref 3.5–5.0)
Anion gap: 14 (ref 5–15)
BUN: 124 mg/dL — ABNORMAL HIGH (ref 8–23)
CO2: 17 mmol/L — ABNORMAL LOW (ref 22–32)
Calcium: 8.3 mg/dL — ABNORMAL LOW (ref 8.9–10.3)
Chloride: 103 mmol/L (ref 98–111)
Creatinine, Ser: 7.01 mg/dL — ABNORMAL HIGH (ref 0.61–1.24)
GFR, Estimated: 8 mL/min — ABNORMAL LOW (ref >60)
Glucose, Bld: 130 mg/dL — ABNORMAL HIGH (ref 70–99)
Phosphorus: 6.7 mg/dL — ABNORMAL HIGH (ref 2.5–4.6)
Potassium: 4.4 mmol/L (ref 3.5–5.1)
Sodium: 134 mmol/L — ABNORMAL LOW (ref 135–145)

## 2023-05-11 LAB — GLUCOSE, CAPILLARY
Glucose-Capillary: 116 mg/dL — ABNORMAL HIGH (ref 70–99)
Glucose-Capillary: 174 mg/dL — ABNORMAL HIGH (ref 70–99)

## 2023-05-11 MED ORDER — PANTOPRAZOLE SODIUM 40 MG PO TBEC
40.0000 mg | DELAYED_RELEASE_TABLET | Freq: Every day | ORAL | 1 refills | Status: DC
  Filled 2023-05-11: qty 30, 30d supply, fill #0

## 2023-05-11 MED ORDER — CARVEDILOL 6.25 MG PO TABS
6.2500 mg | ORAL_TABLET | Freq: Two times a day (BID) | ORAL | 1 refills | Status: DC
  Filled 2023-05-11: qty 60, 30d supply, fill #0

## 2023-05-11 MED ORDER — VITAMIN D (ERGOCALCIFEROL) 1.25 MG (50000 UNIT) PO CAPS
50000.0000 [IU] | ORAL_CAPSULE | ORAL | 0 refills | Status: DC
  Filled 2023-05-11: qty 5, 35d supply, fill #0

## 2023-05-11 MED ORDER — PREDNISONE 20 MG PO TABS
60.0000 mg | ORAL_TABLET | Freq: Every day | ORAL | 0 refills | Status: AC
  Filled 2023-05-11: qty 90, 30d supply, fill #0

## 2023-05-11 MED ORDER — SODIUM BICARBONATE 650 MG PO TABS
650.0000 mg | ORAL_TABLET | Freq: Three times a day (TID) | ORAL | 1 refills | Status: AC
Start: 2023-05-11 — End: 2023-07-10
  Filled 2023-05-11: qty 90, 30d supply, fill #0

## 2023-05-11 MED ORDER — SULFAMETHOXAZOLE-TRIMETHOPRIM 800-160 MG PO TABS
1.0000 | ORAL_TABLET | ORAL | 1 refills | Status: DC
  Filled 2023-05-11: qty 12, 28d supply, fill #0

## 2023-05-11 NOTE — Progress Notes (Signed)
 Discharge medications delivered to patient at bedside [facility]

## 2023-05-11 NOTE — Progress Notes (Signed)
 RN read and reviewed discharge instructions. Pt verbalized understanding. Pt escorted via wheelchair to main entrance to spouse personal vehicle.

## 2023-05-11 NOTE — Discharge Summary (Signed)
 Physician Discharge Summary  Walter Wilkerson ZOX:[id number] DOB: 01/25/1950 DOA: 04/27/2023  PCP: Araceli Knight, PA-C  Admit date: 04/27/2023 Discharge date: 05/11/2023  Time spent: 40 minutes  Recommendations for Outpatient Follow-up:  Follow outpatient CBC/CMP  Follow with [facility] outpatient, needs next dose of rituximab  scheduled Steroids per nephrology  4/23 surgical path in process Follow chest discomfort outpatient  Follow hyperglycemia outpatient - steroid induced, may need medical management   Discharge Diagnoses:  Principal Problem:   AKI (acute kidney injury) (HCC) Active Problems:   Acute kidney injury [facility])   Discharge Condition: stable  Diet recommendation: renal  Filed Weights   05/09/23 0500 05/10/23 0500 05/11/23 0500  Weight: 83.8 kg 84 kg 82.9 kg    History of present illness:  Walter Wilkerson is Walter Grima 73 y.o. male with Keyton Bhat history of lung cancer s/p resection in remission, CKD stage IIIa, primary hypertension, hypothyroidism, emphysema, iron  deficiency anemia, chronic pain syndrome. Patient presented secondary to abnormal outpatient labs with evidence of acute kidney injury. Nephrology consulted for comanagement. 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   Now s/p high dose steroids, rituximab .  Plan for continued prednisone  outpatient with rituximab .  Outpatient renal follow up to be arranged by [facility].   Hospital Course:  Assessment and Plan:  AKI on CKD stage IIIa Rapidly progressive glomerulonephritis, pauci-immune Baseline creatinine difficult to identify but patient's creatinine has been between 1.2 and 1.8 this year.  Creatinine of 5.10 on admission.  Nephrology consulted.  Patient initially managed with IV fluids and was seen to have some mild improvement in creatinine before worsening again. Renal biopsy performed on 4/23 and significant for crescentic glomerulonephritis, pauci-immune type, P ANCA  positive and Anti-GBM negative (4/23 pathology is listed as still pending). Patient given Rituximab  on 4/25 and started on Solu-medrol , now transitioned to prednisone  with plan for eventual taper. -positive dsDNA ab, positive ANA (1:1280, dense fine speckled pattern), SPEP without monoclonal protein, normal kappa/lamda ratio, negative anti GBM, normal complements, negative ASO, positive P anca,  -creatinine peaked at 7/36 -7.01 on day of discharge -Nephrology recommendations: prednisone , Bactrim  for PCP prophylaxis, vitamin D , they're going to arrange rituximab  and outpatient follow up -   Microscopic hematuria Noted on urinalysis.   Chest Discomfort Intermittent "chest tightness" over the past week.  Located across his entire chest.  Feels like Ahnna Dungan "dull ache".  Sometimes he wakes in the middle of night with this, occasionally first thing in the am.   Lasts minutes (5-30 mintues).  No worse with exertion or activity, often occurs at rest.  Not typical description for CAD (not substernal, worse with exertion or improved with rest).  He had stress test 12/2022 without evidence of inducible ischemia.  Echo 4/18 without regional WMA.  His CXR from 4/23 showed chronic emphysematous changes with interstitial prominence, but no confluent infiltrate (doesn't sound truly pulmonary either as no SOB or DOE/worsening with exertion).  EKG today shows NSR, isolated t wave inversion in lead III.  With atypical description and his kidney injury, I don't think additional workup with troponin likely to be clarifying (also had negative stress test in past 6 months).  Notably, he lasts mentions feeling like this when he had cancer, it's possible there could be Vanilla Heatherington component of anxiety.  Discussed with Mr. Sena, discussed return precautions.   Acute respiratory failure with hypoxia Unclear etiology but possibly related to fluid overload. Patient requiring up to 2 L/min of supplemental oxygen. Chest x-ray not remarkable for  clear etiology. Weaned to room air.  Resolved at this time.   Hypomagnesemia Resolved with magnesium  supplementation.   Chronic bilateral LE edema Noted.   Primary hypertension -Continue carvedilol  and hydralazine    History of lung cancer Noted.   OSA -Continue CPAP nightly   Chronic pain syndrome Noted.  Hold gabapentin  with aki.  Follow outpatient, consider resumption at lower dose based on CrCl.   Normocytic anemia Stable.   Hypothyroidism -Continue Synthroid  175 mcg daily   GERD -Continue Protonix    Nausea -Continue Reglan  and Zofran  as needed   Hyperglycemia Likely steroid induced. Follow outpatient, may eventually warrant medical management with prolonged high dose steroid use   Vitamin D  deficiency -Continue vitamin D  supplementation      Procedures:  4/23 ultrasound-guided left renal biopsy.   Echo IMPRESSIONS     1. Left ventricular ejection fraction, by estimation, is 65 to 70%. The  left ventricle has normal function. The left ventricle has no regional  wall motion abnormalities. There is mild left ventricular hypertrophy.  Left ventricular diastolic parameters  were normal.   2. Right ventricular systolic function is normal. The right ventricular  size is normal.   3. The mitral valve is normal in structure. No evidence of mitral valve  regurgitation.   4. The aortic valve is tricuspid. Aortic valve regurgitation is not  visualized.   Consultations: Renal IR  Discharge Exam: Vitals:   05/11/23 0943 05/11/23 1130  BP: (!) 166/89 (!) 155/81  Pulse:  68  Resp:  16  Temp:  98.1 F (36.7 C)  SpO2:  97%   No complaints other than intermittent chest tightness Eager to discharge  General: No acute distress. Cardiovascular: Heart sounds show Jeramiah Mccaughey regular rate, and rhythm. Lungs: Clear to auscultation bilaterally  Abdomen: Soft, nontender, nondistended  Neurological: Alert and oriented 3. Moves all extremities 4 with equal strength.  Cranial nerves II through XII grossly intact. Extremities: No clubbing or cyanosis. No edema.  Discharge Instructions   Discharge Instructions     Call MD for:  difficulty breathing, headache or visual disturbances   Complete by: As directed    Call MD for:  extreme fatigue   Complete by: As directed    Call MD for:  hives   Complete by: As directed    Call MD for:  persistant dizziness or light-headedness   Complete by: As directed    Call MD for:  persistant nausea and vomiting   Complete by: As directed    Call MD for:  redness, tenderness, or signs of infection (pain, swelling, redness, odor or green/yellow discharge around incision site)   Complete by: As directed    Call MD for:  severe uncontrolled pain   Complete by: As directed    Call MD for:  temperature >100.4   Complete by: As directed    Diet - low sodium heart healthy   Complete by: As directed    Discharge instructions   Complete by: As directed    You were seen for acute kidney injury.    You've had Yanina Knupp renal biopsy and are being managed by the kidney doctors.  You had Quashaun Lazalde dose of rituximab .  This will continue outpatient, the nephrology (kidney doctors) will arrange this outpatient for you.  We'll continue you on prednisone  as an outpatient.  While you're on prednisone  and rituximab , we'll have you on bactrim  three times weekly.  The kidney doctors will arrange outpatient follow up to continue to trend your kidney  function and manage your kidney injury.    We've stopped your lasix  and valstartan-hydrochlorothiazide  due to your kidney injury.  We've started you on coreg  as Nakeia Calvi new blood pressure medicine.  We stopped your gabapentin  due to your decreased kidney function.  Follow up with your PCP or kidney doctor to determine if this can be resumed at Fedra Lanter lower dose.  Your blood sugars are elevated.  This is related to your steroids.  You should follow with your outpatient doctor to follow your blood sugars.  If you continue  on steroids long term, you may need to start medicines to help control your blood sugar.   You had some chest discomfort while you were here in the hospital.  It's not clear exactly what is causing this, but I think the pattern of symptoms is not suggestive of Becky Berberian cardiac or pulmonary cause.  If your pain increases, changes, or worsens, please return to the hospital or discuss with your primary doctor.  Follow your low vitamin D  outpatient.   Return for new, recurrent, or worsening symptoms.  Please ask your PCP to request records from this hospitalization so they know what was done and what the next steps will be.   Increase activity slowly   Complete by: As directed       Allergies as of 05/11/2023       Reactions   Pregabalin Other (See Comments)   Elevated LFTs   Lisinopril Nausea Only   Merbromin Hives   Thimerosal (thiomersal) Hives   Ferrous Sulfate  Nausea Only, Other (See Comments)   325 mg once Xena Propst day caused EXTREME NAUSEA        Medication List     STOP taking these medications    furosemide  20 MG tablet Commonly known as: LASIX    gabapentin  300 MG capsule Commonly known as: NEURONTIN    valsartan -hydrochlorothiazide  320-25 MG tablet Commonly known as: DIOVAN -HCT       TAKE these medications    albuterol  108 (90 Base) MCG/ACT inhaler Commonly known as: VENTOLIN  HFA Inhale 2 puffs into the lungs every 4 (four) hours as needed. What changed: reasons to take this   aspirin EC 81 MG tablet Take 81 mg by mouth daily.   atorvastatin  40 MG tablet Commonly known as: LIPITOR Take 1 tablet (40 mg total) by mouth daily.   benzonatate  200 MG capsule Commonly known as: TESSALON  Take 200 mg by mouth 3 (three) times daily as needed for cough.   carvedilol  6.25 MG tablet Commonly known as: COREG  Take 1 tablet (6.25 mg total) by mouth 2 (two) times daily with Ronie Fleeger meal.   clindamycin 1 % external solution Commonly known as: CLEOCIN T Apply 1 Application topically  2 (two) times daily as needed (for scalp irritation).   clobetasol cream 0.05 % Commonly known as: TEMOVATE Apply 1 Application topically 2 (two) times daily as needed (for scalp irritation).   DULoxetine  30 MG capsule Commonly known as: CYMBALTA  Take 1 capsule (30 mg total) by mouth daily.   hydrALAZINE  100 MG tablet Commonly known as: APRESOLINE  Take 1 tablet (100 mg total) by mouth 2 (two) times daily.   levothyroxine  175 MCG tablet Commonly known as: SYNTHROID  Take 175 mcg by mouth daily before breakfast. What changed: Another medication with the same name was removed. Continue taking this medication, and follow the directions you see here.   multivitamin tablet Take 1 tablet by mouth daily with breakfast.   pantoprazole  40 MG tablet Commonly known as: PROTONIX  Take  1 tablet (40 mg total) by mouth daily. What changed: when to take this   predniSONE  20 MG tablet Commonly known as: DELTASONE  Take 3 tablets (60 mg total) by mouth daily with breakfast. Follow up with renal for additional refills Start taking on: May 12, 2023   sodium bicarbonate  650 MG tablet Take 1 tablet (650 mg total) by mouth 3 (three) times daily.   sulfamethoxazole -trimethoprim  800-160 MG tablet Commonly known as: BACTRIM  DS Take 1 tablet by mouth 3 (three) times Jourdin Gens week. Start taking on: May 12, 2023   Trelegy Ellipta  100-62.5-25 MCG/ACT Aepb Generic drug: Fluticasone -Umeclidin-Vilant Inhale 1 puff into the lungs daily in the afternoon.   vitamin B-12 500 MCG tablet Commonly known as: CYANOCOBALAMIN  Take 500 mcg by mouth daily.   Vitamin D  (Ergocalciferol ) 1.25 MG (50000 UNIT) Caps capsule Commonly known as: DRISDOL  Take 1 capsule (50,000 Units total) by mouth every 7 (seven) days. Start taking on: May 15, 2023       Allergies  Allergen Reactions   Pregabalin Other (See Comments)    Elevated LFTs   Lisinopril Nausea Only   Merbromin Hives   Thimerosal (Thiomersal) Hives   Ferrous  Sulfate Nausea Only and Other (See Comments)    325 mg once Keondre Markson day caused EXTREME NAUSEA      The results of significant diagnostics from this hospitalization (including imaging, microbiology, ancillary and laboratory) are listed below for reference.    Significant Diagnostic Studies: US  BIOPSY (KIDNEY) Result Date: 05/03/2023 INDICATION: 36 year old with acute kidney injury. Request for Kerri Kovacik random renal biopsy. EXAM: ULTRASOUND-GUIDED RANDOM RENAL BIOPSY MEDICATIONS: Moderate sedation ANESTHESIA/SEDATION: Moderate (conscious) sedation was employed during this procedure. Emrah Ariola total of Versed  2 mg and Fentanyl  100 mcg was administered intravenously by the radiology nurse. Total intra-service moderate Sedation Time: 22 minutes. The patient's level of consciousness and vital signs were monitored continuously by radiology nursing throughout the procedure under my direct supervision. FLUOROSCOPY TIME:  None COMPLICATIONS: None immediate. PROCEDURE: Informed written consent was obtained from the patient after Karagan Lehr thorough discussion of the procedural risks, benefits and alternatives. All questions were addressed. Anahis Furgeson timeout was performed prior to the initiation of the procedure. Patient was placed prone. Both kidneys were evaluated with ultrasound. Left kidney was selected for biopsy. Left flank was prepped and draped in sterile fashion. Maximal barrier sterile technique was utilized including caps, mask, sterile gowns, sterile gloves, sterile drape, hand hygiene and skin antiseptic. Skin was anesthetized using 1% lidocaine . Small incision was made. Using ultrasound guidance, Caryssa Elzey 17 gauge coaxial needle was directed to the lower pole cortex. Two core biopsies were obtained from the lower pole cortex with an 18 gauge core device. Specimens placed in saline. Gel-Foam slurry was injected as the 17 gauge coaxial needle was retracted. Bandage placed over the puncture site. FINDINGS: 2 adequate core biopsy specimens were  obtained. No immediate bleeding or hematoma formation. IMPRESSION: Successful ultrasound-guided left renal biopsy. Electronically Signed   By: Elene Griffes M.D.   On: 05/03/2023 12:26   DG Chest 2 View Result Date: 05/03/2023 CLINICAL DATA:  Respiratory failure and hypoxia EXAM: CHEST - 2 VIEW COMPARISON:  03/29/2023 FINDINGS: Cardiac shadow is stable. Elevation of the right hemidiaphragm is seen. No focal infiltrate is seen. Emphysematous changes with chronic interstitial prominence are again seen and stable over multiple previous exams. The previously noted left-sided infiltrate has resolved. IMPRESSION: Chronic emphysematous changes with interstitial prominence. No focal confluent infiltrate is seen. Electronically Signed   By: Violeta Grey  M.D.   On: 05/03/2023 11:15   ECHOCARDIOGRAM COMPLETE Result Date: 04/28/2023    ECHOCARDIOGRAM REPORT   Patient Name:   BERTUS BIBEAU Date of Exam: 04/28/2023 Medical Rec #:  [id number]      Height:       68.0 in Accession #:    [id number]     Weight:       178.6 lb Date of Birth:  1950/05/25      BSA:          1.948 m Patient Age:    72 years       BP:           151/79 mmHg Patient Gender: M              HR:           83 bpm. Exam Location:  Inpatient Procedure: 2D Echo, Cardiac Doppler and Color Doppler (Both Spectral and Color            Flow Doppler were utilized during procedure). Indications:    CHF- Acute Diastolic I50.31  History:        Patient has prior history of Echocardiogram examinations, most                 recent 11/25/2022. Signs/Symptoms:Chest Pain and Shortness of                 Breath; Risk Factors:Hypertension.  Sonographer:    Terrilee Few RCS Referring Phys: [id number] Margo Shells ATFTDDU  Sonographer Comments: Image acquisition challenging due to respiratory motion. IMPRESSIONS  1. Left ventricular ejection fraction, by estimation, is 65 to 70%. The left ventricle has normal function. The left ventricle has no regional wall motion abnormalities.  There is mild left ventricular hypertrophy. Left ventricular diastolic parameters were normal.  2. Right ventricular systolic function is normal. The right ventricular size is normal.  3. The mitral valve is normal in structure. No evidence of mitral valve regurgitation.  4. The aortic valve is tricuspid. Aortic valve regurgitation is not visualized. FINDINGS  Left Ventricle: Left ventricular ejection fraction, by estimation, is 65 to 70%. The left ventricle has normal function. The left ventricle has no regional wall motion abnormalities. The left ventricular internal cavity size was normal in size. There is  mild left ventricular hypertrophy. Left ventricular diastolic parameters were normal. Right Ventricle: The right ventricular size is normal. Right vetricular wall thickness was not assessed. Right ventricular systolic function is normal. Left Atrium: Left atrial size was normal in size. Right Atrium: Right atrial size was normal in size. Pericardium: There is no evidence of pericardial effusion. Mitral Valve: The mitral valve is normal in structure. No evidence of mitral valve regurgitation. Tricuspid Valve: The tricuspid valve is normal in structure. Tricuspid valve regurgitation is trivial. Aortic Valve: The aortic valve is tricuspid. Aortic valve regurgitation is not visualized. Aortic valve peak gradient measures 13.2 mmHg. Pulmonic Valve: The pulmonic valve was not well visualized. Pulmonic valve regurgitation is not visualized. No evidence of pulmonic stenosis. Aorta: The aortic root is normal in size and structure. IAS/Shunts: No atrial level shunt detected by color flow Doppler.  LEFT VENTRICLE PLAX 2D LVIDd:         4.40 cm   Diastology LVIDs:         2.90 cm   LV e' medial:    9.25 cm/s LV PW:         1.20 cm   LV E/e' medial:  9.1 LV  IVS:        0.90 cm   LV e' lateral:   10.80 cm/s LVOT diam:     2.00 cm   LV E/e' lateral: 7.8 LV SV:         72 LV SV Index:   37 LVOT Area:     3.14 cm  RIGHT  VENTRICLE            IVC RV S prime:     8.92 cm/s  IVC diam: 2.00 cm TAPSE (M-mode): 2.0 cm LEFT ATRIUM             Index        RIGHT ATRIUM           Index LA diam:        4.30 cm 2.21 cm/m   RA Area:     20.40 cm LA Vol (A2C):   67.2 ml 34.50 ml/m  RA Volume:   52.70 ml  27.06 ml/m LA Vol (A4C):   63.3 ml 32.50 ml/m LA Biplane Vol: 66.1 ml 33.94 ml/m  AORTIC VALVE AV Area (Vmax): 1.88 cm AV Vmax:        182.00 cm/s AV Peak Grad:   13.2 mmHg LVOT Vmax:      108.67 cm/s LVOT Vmean:     76.433 cm/s LVOT VTI:       0.228 m  AORTA Ao Root diam: 3.80 cm MITRAL VALVE MV Area (PHT): 3.48 cm    SHUNTS MV Decel Time: 218 msec    Systemic VTI:  0.23 m MV E velocity: 84.20 cm/s  Systemic Diam: 2.00 cm MV Laurice Iglesia velocity: 73.80 cm/s MV E/Erma Joubert ratio:  1.14 Ola Berger MD Electronically signed by Ola Berger MD Signature Date/Time: 04/28/2023/3:11:27 PM    Final    US  RENAL Result Date: 04/27/2023 CLINICAL DATA:  Acute kidney injury EXAM: RENAL / URINARY TRACT ULTRASOUND COMPLETE COMPARISON:  PET CT 04/18/2022. FINDINGS: Right Kidney: Renal measurements: 9.5 x 5.4 x 5.3 cm = volume: 142 mL. Mildly echogenic. There is no hydronephrosis. There are 2 hypoechoic areas identified in the right kidney. One is in the inferior pole measuring 1.5 x 1.8 x 1.8 cm, likely cysts. The other is in the mid kidney measuring 2.2 x 1.5 x 1.8 cm, also likely cysts. These are both seen on prior CT. Left Kidney: Renal measurements: 12.2 x 7.6 x 6.2 cm = volume: 297 mL. There is Ariatna Jester cyst in the mid left kidney measuring 1.4 x 1.5 x 1.7 cm. There is no hydronephrosis. Kidneys are mildly echogenic. Bladder: Appears normal for degree of bladder distention. Other: None. IMPRESSION: 1. No hydronephrosis. 2. Mildly echogenic kidneys bilaterally, nonspecific but can be seen in medical renal disease. 3. Bilateral renal cysts. Electronically Signed   By: Tyron Gallon M.D.   On: 04/27/2023 23:52    Microbiology: No results found for this or any previous  visit (from the past 240 hours).   Labs: Basic Metabolic Panel: Recent Labs  Lab 05/07/23 0442 05/08/23 0353 05/09/23 0414 05/10/23 0440 05/11/23 0426  NA 133* 132* 136 134* 134*  K 4.3 4.1 4.2 4.1 4.4  CL 102 100 104 104 103  CO2 17* 18* 19* 18* 17*  GLUCOSE 159* 163* 138* 135* 130*  BUN 85* 100* 115* 116* 124*  CREATININE 6.63* 7.25* 7.31* 7.36* 7.01*  CALCIUM  8.6* 8.3* 8.5* 8.3* 8.3*  PHOS 5.5* 5.9* 6.2* 6.3* 6.7*   Liver Function Tests: Recent Labs  Lab 05/07/23 0442 05/08/23  2956 05/09/23 0414 05/10/23 0440 05/11/23 0426  ALBUMIN  2.7* 2.8* 2.7* 2.7* 2.5*   No results for input(s): "LIPASE", "AMYLASE" in the last 168 hours. No results for input(s): "AMMONIA" in the last 168 hours. CBC: Recent Labs  Lab 05/06/23 0445 05/07/23 0442 05/08/23 0354 05/09/23 0414 05/10/23 0440  WBC 5.1 12.4* 11.6* 10.9* 10.7*  HGB 9.4* 9.2* 9.3* 9.4* 10.1*  HCT 28.2* 28.8* 28.0* 28.8* 31.4*  MCV 88.1 89.7 88.1 89.7 90.8  PLT 226 265 277 251 255   Cardiac Enzymes: No results for input(s): "CKTOTAL", "CKMB", "CKMBINDEX", "TROPONINI" in the last 168 hours. BNP: BNP (last 3 results) Recent Labs    11/24/22 0928  BNP 90.1    ProBNP (last 3 results) No results for input(s): "PROBNP" in the last 8760 hours.  CBG: Recent Labs  Lab 05/10/23 1202 05/10/23 1617 05/10/23 2104 05/11/23 0724 05/11/23 1128  GLUCAP 136* 125* 189* 116* 174*       Signed:  [name] MD.  [facility] 05/11/2023, 1:17 PM

## 2023-05-11 NOTE — Plan of Care (Signed)

## 2023-05-11 NOTE — Progress Notes (Signed)
 [facility] NEPHROLOGY PROGRESS NOTE  Summary: Pt is a 73 y.o. yo male with past medical history significant for lung cancer s/p resection, hypertension, hypothyroidism, emphysema, anemia on erythropoietin  injection as outpatient, CKD 3 presented with AKI found in outpatient lab.  Problems # Rapidly progressing glomerulonephritis/RPGN: patient presented with AKI, creatinine level is around 6, nonoliguric.  Urine with active sediment.  Anti-GBM, C3, C4, kappa lambda ratio, hep B, hep C were negative.  ANA came back positive. P-ANCA was positive 1:160, C-ANCA negative. The biopsy showed crescentic glomerulonephritis, pauci-immune type, P- ANCA positive.  Anti-GBM negative.  Global glomerulosclerosis 9 out of 35 glomeruli, segmental sclerosis absent, moderate IF, mild tubular atrophy, mild arterial intimal fibrosis and no arteriolar hyalinosis. Creat peaked yest at 7.3 and is down today to 7.0.  Okay for discharge, have d/w pmd:    S/P rituximab  #1 on 4/25 S/P high dose Solu-Medrol  1,000mg  IV daily x 3 on 4/25, 4/26 and 4/27   Cont prednisone  60 mg daily at d/c (give 30d supply, we will taper as outpatient) Cont po Bactrim  three times per week for prophylaxis Cont vit D at d/c  Pt will be contacted by [facility] when details of 2nd rituximab  dose are known Pt will be contacted by [facility] when pt's f/u office visit is scheduled  # Hypertension: avoid acei/ ARB given AKI. Cont coreg  and hydralazine . BP good  # Metabolic acidosis: Started sodium bicarbonate .  #History of iron  deficiency anemia: Iron  saturation 21%, ferritin 154.  Noted mild allergic reaction to iron .  Order Aranesp  on 4/21.  Monitor hemoglobin.  #History of COPD  #History of lung cancer 2009  Subjective: Seen and examined. Creat down to 7.0 today, good UOP. No new c/o's.   Objective Vital signs in last 24 hours: Vitals:   05/11/23 0819 05/11/23 0900 05/11/23 0943 05/11/23 1130  BP:  (!) 166/89 (!) 166/89 (!) 155/81   Pulse:    68  Resp:    16  Temp:    98.1 F (36.7 C)  TempSrc:      SpO2: 98%   97%  Weight:      Height:         Physical Exam: General: Able to lie flat comfortable, not in distress Heart:RRR, s1s2 nl Lungs: Clear bilateral, no wheeze or crackle Abdomen:soft, Non-tender, non-distended Extremities:No edema Neurology: Alert, awake and following commands.  Larry Poag  MD  [facility] 05/11/2023, 12:40 PM  Recent Labs  Lab 05/09/23 0414 05/10/23 0440 05/11/23 0426  HGB 9.4* 10.1*  --   ALBUMIN  2.7* 2.7* 2.5*  CALCIUM  8.5* 8.3* 8.3*  PHOS 6.2* 6.3* 6.7*  CREATININE 7.31* 7.36* 7.01*  K 4.2 4.1 4.4    Inpatient medications:  atorvastatin   40 mg Oral Daily   budeson-glycopyrrolate -formoterol   2 puff Inhalation BID   carvedilol   6.25 mg Oral BID WC   darbepoetin (ARANESP ) injection - NON-DIALYSIS  60 mcg Subcutaneous Q Mon-1800   DULoxetine   30 mg Oral Daily   hydrALAZINE   100 mg Oral BID   insulin  aspart  0-15 Units Subcutaneous TID WC   levothyroxine   175 mcg Oral Q0600   pantoprazole   40 mg Oral Daily   predniSONE   60 mg Oral Q breakfast   sodium bicarbonate   650 mg Oral TID   sulfamethoxazole -trimethoprim   1 tablet Oral Once per day on Monday Wednesday Friday   Vitamin D  (Ergocalciferol )  50,000 Units Oral Q7 days    acetaminophen  **OR** acetaminophen , guaiFENesin -dextromethorphan , ipratropium-albuterol , metoCLOPramide  (REGLAN ) injection, ondansetron 

## 2023-05-12 ENCOUNTER — Telehealth: Payer: Self-pay

## 2023-05-12 ENCOUNTER — Encounter (HOSPITAL_COMMUNITY): Payer: Self-pay

## 2023-05-12 NOTE — Transitions of Care (Post Inpatient/ED Visit) (Signed)
   05/12/2023  Name: Walter Wilkerson MRN: [id number] DOB: 04-16-1950  Today's TOC FU Call Status: Today's TOC FU Call Status:: Unsuccessful Call (1st Attempt) Unsuccessful Call (1st Attempt) Date: 05/12/23  Attempted to reach the patient regarding the most recent Inpatient/ED visit.  Follow Up Plan: No further outreach attempts will be made at this time. We have been unable to contact the patient.  Tonia Frankel RN, CCM [place]  [facility]Population Health RN Care Manager [phone]

## 2023-05-15 ENCOUNTER — Telehealth: Payer: Self-pay | Admitting: *Deleted

## 2023-05-15 NOTE — Transitions of Care (Post Inpatient/ED Visit) (Signed)
   05/15/2023  Name: Walter Wilkerson MRN: [id number] DOB: 12/17/1950  Today's TOC FU Call Status: Today's TOC FU Call Status:: Unsuccessful Call (2nd Attempt) Unsuccessful Call (2nd Attempt) Date: 05/15/23  Attempted to reach the patient regarding the most recent Inpatient/ED visit.  Follow Up Plan: Additional outreach attempts will be made to reach the patient to complete the Transitions of Care (Post Inpatient/ED visit) call.   Una Ganser BSN RN [facility] Health Care Management Coordinator [email or web address] Direct Dial: [phone]  Fax: [phone] Website: [email or web address]

## 2023-05-16 ENCOUNTER — Ambulatory Visit: Admitting: Physician Assistant

## 2023-05-16 ENCOUNTER — Telehealth: Payer: Self-pay

## 2023-05-16 DIAGNOSIS — D631 Anemia in chronic kidney disease: Secondary | ICD-10-CM | POA: Diagnosis not present

## 2023-05-16 DIAGNOSIS — R768 Other specified abnormal immunological findings in serum: Secondary | ICD-10-CM | POA: Diagnosis not present

## 2023-05-16 DIAGNOSIS — R779 Abnormality of plasma protein, unspecified: Secondary | ICD-10-CM | POA: Diagnosis not present

## 2023-05-16 DIAGNOSIS — N019 Rapidly progressive nephritic syndrome with unspecified morphologic changes: Secondary | ICD-10-CM | POA: Diagnosis not present

## 2023-05-16 DIAGNOSIS — N179 Acute kidney failure, unspecified: Secondary | ICD-10-CM | POA: Diagnosis not present

## 2023-05-16 DIAGNOSIS — N189 Chronic kidney disease, unspecified: Secondary | ICD-10-CM | POA: Diagnosis not present

## 2023-05-16 DIAGNOSIS — E559 Vitamin D deficiency, unspecified: Secondary | ICD-10-CM | POA: Diagnosis not present

## 2023-05-16 DIAGNOSIS — I129 Hypertensive chronic kidney disease with stage 1 through stage 4 chronic kidney disease, or unspecified chronic kidney disease: Secondary | ICD-10-CM | POA: Diagnosis not present

## 2023-05-16 DIAGNOSIS — E872 Acidosis, unspecified: Secondary | ICD-10-CM | POA: Diagnosis not present

## 2023-05-16 NOTE — Transitions of Care (Post Inpatient/ED Visit) (Signed)
   05/16/2023  Name: Walter Wilkerson MRN: [id number] DOB: 14-Apr-1950  Today's TOC FU Call Status: Today's TOC FU Call Status:: Unsuccessful Call (3rd Attempt) Unsuccessful Call (3rd Attempt) Date: 05/16/23  Attempted to reach the patient regarding the most recent Inpatient/ED visit.  Follow Up Plan: No further outreach attempts will be made at this time. We have been unable to contact the patient.  Tonia Frankel RN, CCM [place]  VBCI-Population Health RN Care Manager [phone]

## 2023-05-18 LAB — LAB REPORT - SCANNED
A1c: 5.8
Creatinine, POC: 43.4 mg/dL
EGFR: 8

## 2023-05-19 ENCOUNTER — Encounter (HOSPITAL_COMMUNITY)

## 2023-05-19 ENCOUNTER — Ambulatory Visit (INDEPENDENT_AMBULATORY_CARE_PROVIDER_SITE_OTHER): Admitting: Physician Assistant

## 2023-05-19 VITALS — BP 140/72 | HR 71 | Ht 68.0 in | Wt 184.0 lb

## 2023-05-19 DIAGNOSIS — I10 Essential (primary) hypertension: Secondary | ICD-10-CM

## 2023-05-19 DIAGNOSIS — T50905A Adverse effect of unspecified drugs, medicaments and biological substances, initial encounter: Secondary | ICD-10-CM | POA: Insufficient documentation

## 2023-05-19 DIAGNOSIS — N019 Rapidly progressive nephritic syndrome with unspecified morphologic changes: Secondary | ICD-10-CM | POA: Diagnosis not present

## 2023-05-19 DIAGNOSIS — R739 Hyperglycemia, unspecified: Secondary | ICD-10-CM | POA: Insufficient documentation

## 2023-05-19 DIAGNOSIS — N179 Acute kidney failure, unspecified: Secondary | ICD-10-CM | POA: Diagnosis not present

## 2023-05-19 HISTORY — DX: Hyperglycemia, unspecified: R73.9

## 2023-05-19 HISTORY — DX: Adverse effect of unspecified drugs, medicaments and biological substances, initial encounter: T50.905A

## 2023-05-19 MED ORDER — GVOKE HYPOPEN 2-PACK 1 MG/0.2ML ~~LOC~~ SOAJ: SUBCUTANEOUS | 0 refills | Status: DC

## 2023-05-19 MED ORDER — TOUJEO SOLOSTAR 300 UNIT/ML ~~LOC~~ SOPN: 5.0000 [IU] | PEN_INJECTOR | Freq: Every day | SUBCUTANEOUS | 0 refills | Status: DC

## 2023-05-19 NOTE — Patient Instructions (Addendum)
 Start toujeo 5 units at bedtime and increase by 2 units every 5 days until fasting sugars are 90-120.   Placed libre sensor and can report readings to me via mychart.   Follow up in 2 weeks.   Hypoglycemia Hypoglycemia is when the amount of sugar, or glucose, in your blood is too low. Low blood sugar can happen if you have diabetes or if you don't have diabetes. It may be an emergency. What are the causes? Low blood sugar happens most often in people who have diabetes. It may be caused by: Diabetes medicine. Not eating enough, or not eating often enough. Being more active than normal. If you don't have diabetes, you may still get low blood sugar if: There's a tumor in your pancreas. A tumor is a growth of cells that isn't normal. You don't eat enough, or you fast. Fasting is when you don't eat for long periods at a time. You have a bad infection or illness. You have problems after weight loss surgery. You have kidney or liver problems. You take certain medicines. What increases the risk? You're more likely to have low blood sugar if: You have diabetes and take medicine for it. You drink a lot of alcohol. You get sick. What are the signs or symptoms? Mild Hunger or feeling like you may vomit. Sweating and feeling cold to the touch. Feeling dizzy or light-headed. Being sleepy or having trouble sleeping. A headache. Blurry vision. Mood changes. These include feeling worried, nervous, or easily annoyed. Moderate Feeling confused. Changes in the way you act. Weakness. An uneven heartbeat. Very bad Having very low blood sugar is an emergency. It can cause: Fainting. Seizures. A coma. Death. How is this diagnosed?  Low blood sugar can be found with a blood test. This test tells you how much sugar is in your blood. It's done while you're having symptoms. Your health care provider may also do an exam and look at your medical history. How is this treated? Treating low blood  sugar If you have low blood sugar, eat or drink something with sugar in it right away. The food or drink should have 15 grams of a fast-acting carbohydrate (carb). Options include: 4 oz (120 mL) of fruit juice. 4 oz (120 mL) of soda (not diet soda). A few pieces of hard candy. Check food labels to see how many pieces to eat. 1 Tbsp (15 mL) of sugar or honey. 4 glucose tablets. 1 tube of glucose gel. Treating low blood sugar if you have diabetes Talk with your provider about how much carb you should take. If you're alert and can swallow safely, you may follow the 15:15 rule: Take 15 grams of a fast-acting carb. Check your blood sugar 15 minutes after you take the carb. If your blood sugar is still at or below 70 mg/dL (3.9 mmol/L), take 15 grams of a carb again. If your blood sugar doesn't go above 70 mg/dL (3.9 mmol/L) after 3 tries, get help right away. After your blood sugar goes back to normal, eat a meal or a snack within 1 hour. Always keep 15 grams of a fast-acting carb with you. This could be: 4 glucose tablets. A few pieces of hard candy. 1 Tbsp (15 mL) of honey or sugar. 1 tube of glucose gel. Treating very low blood sugar If your blood sugar is less than 54 mg/dL (3 mmol/L), it's an emergency. Get help right away. If you can't eat or drink, you will need to be given  glucagon. A family member or friend should learn how to check your blood sugar and give you glucagon. Ask your provider if you should keep a glucagon kit at home. You may also need to be treated in a hospital. Follow these instructions at home: If you have diabetes: Always keep a fast-acting carb (15 grams) with you. Follow your diabetes care plan. Make sure you: Know the symptoms of low blood sugar. Check your blood sugar as often as told. Always check it before and after you exercise. Always check your blood sugar before you drive. Take your medicines as told. Eat on time. Do not skip meals. Share your  diabetes care plan with: Your work or school. The people you live with. Wear an alert bracelet or carry a card that says you have diabetes. General instructions If you drink alcohol: Limit how much you have to: 0-1 drink a day if you're male. 0-2 drinks a day if you're male. Know how much alcohol is in your drink. In the [place] one drink is one 12 oz bottle of beer (355 mL), one 5 oz glass of wine (148 mL), or one 1 oz glass of hard liquor (44 mL). Be sure to eat food when you drink alcohol. Be sure to check your blood sugar after you drink. Alcohol may lead to low blood sugar later. Where to find more information American Diabetes Association (ADA): diabetes.org Contact a health care provider if: You have low blood sugar often. You have diabetes and are having trouble keeping your blood sugar in the right range. Get help right away if: You can't get your blood sugar above 70 mg/dL (3.9 mmol/L) after 3 tries. Your blood sugar is below 54 mg/dL (3 mmol/L). You have a seizure. You faint. These symptoms may be an emergency. Call 911 right away. Do not wait to see if the symptoms will go away. Do not drive yourself to the hospital. This information is not intended to replace advice given to you by your health care provider. Make sure you discuss any questions you have with your health care provider. Document Revised: 09/29/2022 Document Reviewed: 03/16/2022 Elsevier Patient Education  2024 [employer].

## 2023-05-19 NOTE — Progress Notes (Unsigned)
 Established Patient Office Visit  Subjective   Patient ID: Walter Wilkerson, male    DOB: [birth year]  Age: 73 y.o. MRN: [id number]  Chief Complaint  Patient presents with   Hospitalization Follow-up    Acute kidney failure     HPI Pt is a 73 yo male with hx of lung cancer, HTN, hypothyroidism, IDA who presents to the clinic for hospital follow up. He was admitted to hospital after labs showed a sudden drop in GFR and increase in serum creatinine after starting lasix  for lower leg edema. He was initally thought to be dehydrated but it was found he had rapidly progressive glomerulonephritis.   He was started on rituximab  and high dose steroids. He is following closesly with nephrology.  He had to be given insulin  in hospital due to high sugar readings likely due to steroids. His gabapentin  was stopped.    He is emotionally with all his health needs today but overall doing well.   Patient Active Problem List   Diagnosis Date Noted   Drug-induced hyperglycemia 05/19/2023   Pauci-immune RPGN (rapidly progressive glomerulonephritis) 05/19/2023   Acute kidney injury (HCC) 04/28/2023   AKI (acute kidney injury) (HCC) 04/27/2023   Bilateral lower extremity edema 04/21/2023   SOB (shortness of breath) on exertion 04/21/2023   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   Iron  deficiency anemia 11/25/2022   Pericardial effusion 11/24/2022   Thrombocytopenia with normocytic iron  deficiency anemia 11/24/2022   Elevated d-dimer 11/22/2022   Acute cough 11/22/2022   Community acquired pneumonia of left upper lobe of lung 11/21/2022   Shortness of breath 11/21/2022   Tonsillar mass 11/07/2022   Macrocytic anemia 09/09/2022   Neck pain on right side 06/14/2022   Thoracic aortic aneurysm 40 mm very of CT from 2024 04/20/2022   Multiple lung nodules on CT 03/25/2022   Abnormal CT scan, lung 03/25/2022   History of lung cancer 03/25/2022   Abnormal CT scan of lung 03/08/2022   Centrilobular  emphysema (HCC) 03/08/2022   Intermittent chest pain 03/08/2022   Palpitations 03/08/2022   Newly recognized heart murmur 03/08/2022   Hand cramp 04/14/2021   Polyarthritis 02/16/2021   Allergic sinusitis 06/03/2020   Lumbar spondylosis 12/03/2019   Right hand pain 12/03/2019   Primary osteoarthritis involving multiple joints 12/03/2019   Muscle cramps 09/03/2019   Chronic pain syndrome 05/29/2018   Epigastric pain 04/20/2017   Fatigue 11/04/2016   Elevated serum creatinine 02/10/2016   Acquired hypothyroidism 08/11/2014   Essential hypertension 08/11/2014   Past Medical History:  Diagnosis Date   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 Neil Balls   VASECTOMY     Social History   Tobacco Use   Smoking status: Former    Current packs/day: 0.00    Average packs/day: 1 pack/day for 30.0 years (30.0 ttl pk-yrs)    Types: Cigarettes    Start date: 08/11/1974    Quit date: 08/10/2004    Years since quitting: 18.7   Smokeless tobacco: Never  Vaping Use   Vaping status: Never Used  Substance Use Topics   Alcohol use: Yes    Alcohol/week: 21.0 standard drinks of alcohol    Types: 21 Shots of liquor per week  Comment: 1-2 shots every night   Drug use: No   Family History  Problem Relation Age of Onset   Heart attack Father    Hyperlipidemia Father    Hypertension Father    Heart disease Father    Diabetes Other        grandmother    Allergies  Allergen Reactions   Pregabalin Other (See Comments)    Elevated LFTs   Lisinopril Nausea Only   Merbromin Hives   Thimerosal (Thiomersal) Hives   Ferrous Sulfate  Nausea Only and Other (See Comments)    325 mg once a day caused EXTREME NAUSEA      ROS See HPI.    Objective:     BP (!)  140/72   Pulse 71   Ht 5\' 8"  (1.727 m)   Wt 184 lb (83.5 kg)   SpO2 99%   BMI 27.98 kg/m  BP Readings from Last 3 Encounters:  05/19/23 (!) 140/72  05/11/23 (!) 155/81  04/27/23 136/69   Wt Readings from Last 3 Encounters:  05/19/23 184 lb (83.5 kg)  05/11/23 182 lb 12.2 oz (82.9 kg)  04/27/23 180 lb (81.6 kg)      Physical Exam Constitutional:      Appearance: Normal appearance.  HENT:     Head: Normocephalic.  Cardiovascular:     Rate and Rhythm: Normal rate.  Pulmonary:     Effort: Pulmonary effort is normal.  Neurological:     General: No focal deficit present.     Mental Status: He is alert and oriented to person, place, and time.  Psychiatric:        Mood and Affect: Mood normal.         Assessment & Plan:  Aaron AasAaron AasKeeven Skeeters" was seen today for hospitalization follow-up.  Diagnoses and all orders for this visit:  Drug-induced hyperglycemia -     CBC w/Diff/Platelet -     CMP14+EGFR -     insulin  glargine, 1 Unit Dial, (TOUJEO SOLOSTAR) 300 UNIT/ML Solostar Pen; Inject 5 Units into the skin at bedtime. Increase by 2 units every 5 days until fasting sugars 90-120. -     Glucagon (GVOKE HYPOPEN 2-PACK) 1 MG/0.2ML SOAJ; Inject 1mg  pen into outer thigh or lower abdomen if blood sugar less than 54.  AKI (acute kidney injury) (HCC) -     CBC w/Diff/Platelet -     CMP14+EGFR  Pauci-immune RPGN (rapidly progressive glomerulonephritis) -     CBC w/Diff/Platelet -     CMP14+EGFR  Essential hypertension   Pt is doing ok today.  Vitals stable He is in close follow up with [facility] for CKD/AKI/Pauci-immune RPGN.  CKD diet information given to help patient with choices He continues on steroids with elevated glucose Cbc and cmp ordered today Pt set up with 14 day CGM Libre Pt not a candidate for metformin or SGLT-2 Start toujeo 5 units at bedtime Increase by 2 units every 5 days until fasting sugars 90-120 Discussed hypoglycemia Gvoke pen given for  as needed if sugars decrease below 54 Follow up in 2 weeks to go over sugars on libre Continue to stay off gabapentin    Spent 45 minutes with patient in chart review, discussing treatment plan, and coordinating care.    Return in about 2 weeks (around 06/02/2023).    Trip Cavanagh, PA-C

## 2023-05-20 LAB — CBC WITH DIFFERENTIAL/PLATELET
Basophils Absolute: 0 10*3/uL (ref 0.0–0.2)
Basos: 0 %
EOS (ABSOLUTE): 0 10*3/uL (ref 0.0–0.4)
Eos: 0 %
Hematocrit: 34.1 % — ABNORMAL LOW (ref 37.5–51.0)
Hemoglobin: 11 g/dL — ABNORMAL LOW (ref 13.0–17.7)
Immature Grans (Abs): 0.1 10*3/uL (ref 0.0–0.1)
Immature Granulocytes: 1 %
Lymphocytes Absolute: 0.1 10*3/uL — ABNORMAL LOW (ref 0.7–3.1)
Lymphs: 1 %
MCH: 29.6 pg (ref 26.6–33.0)
MCHC: 32.3 g/dL (ref 31.5–35.7)
MCV: 92 fL (ref 79–97)
Monocytes Absolute: 0.4 10*3/uL (ref 0.1–0.9)
Monocytes: 3 %
Neutrophils Absolute: 10.5 10*3/uL — ABNORMAL HIGH (ref 1.4–7.0)
Neutrophils: 95 %
Platelets: 112 10*3/uL — ABNORMAL LOW (ref 150–450)
RBC: 3.71 x10E6/uL — ABNORMAL LOW (ref 4.14–5.80)
RDW: 16.8 % — ABNORMAL HIGH (ref 11.6–15.4)
WBC: 11 10*3/uL — ABNORMAL HIGH (ref 3.4–10.8)

## 2023-05-20 LAB — CMP14+EGFR
ALT: 14 IU/L (ref 0–44)
AST: 9 IU/L (ref 0–40)
Albumin: 3.5 g/dL — ABNORMAL LOW (ref 3.8–4.8)
Alkaline Phosphatase: 51 IU/L (ref 44–121)
BUN/Creatinine Ratio: 18 (ref 10–24)
BUN: 119 mg/dL (ref 8–27)
Bilirubin Total: 0.3 mg/dL (ref 0.0–1.2)
CO2: 14 mmol/L — ABNORMAL LOW (ref 20–29)
Calcium: 8.5 mg/dL — ABNORMAL LOW (ref 8.6–10.2)
Chloride: 99 mmol/L (ref 96–106)
Creatinine, Ser: 6.57 mg/dL — ABNORMAL HIGH (ref 0.76–1.27)
Globulin, Total: 2.4 g/dL (ref 1.5–4.5)
Glucose: 147 mg/dL — ABNORMAL HIGH (ref 70–99)
Potassium: 5.1 mmol/L (ref 3.5–5.2)
Sodium: 131 mmol/L — ABNORMAL LOW (ref 134–144)
Total Protein: 5.9 g/dL — ABNORMAL LOW (ref 6.0–8.5)
eGFR: 8 mL/min/{1.73_m2} — ABNORMAL LOW (ref >59)

## 2023-05-21 ENCOUNTER — Encounter: Payer: Self-pay | Admitting: Physician Assistant

## 2023-05-21 NOTE — Progress Notes (Signed)
 Very small improvement in creatinine.  Albumin , hemoglobin and calcium  improving.  Stable labs.

## 2023-05-22 ENCOUNTER — Telehealth: Payer: Self-pay

## 2023-05-22 ENCOUNTER — Encounter: Payer: Self-pay | Admitting: Physician Assistant

## 2023-05-22 DIAGNOSIS — T50905A Adverse effect of unspecified drugs, medicaments and biological substances, initial encounter: Secondary | ICD-10-CM

## 2023-05-22 NOTE — Telephone Encounter (Signed)
 Copied from CRM [id number]. Topic: Clinical - Prescription Issue >> May 19, 2023 11:21 AM Blair Bumpers wrote: Reason for CRM: Madelyne Schiff, with [facility], called stating that they received a prescription for insulin  glargine, 1 Unit Dial, (TOUJEO SOLOSTAR) 300 UNIT/ML Solostar Pen. He states he need a new prescription resent for quantity adjustment. Madelyne Schiff stated it was sent for just one pen and they can't break a box for one pen. He stated the new rx needs to be resent for 4.1mLs.

## 2023-05-23 NOTE — Telephone Encounter (Signed)
 Please follow up with [facility] [phone].  Order for insulin  glargine, 1 Unit Dial, (TOUJEO SOLOSTAR) 300 UNIT/ML Solostar Pen needs new order, needs to read total ml to dispense, written as one pen.     Copied from CRM [id number]. Topic: Clinical - Prescription Issue >> May 23, 2023  2:54 PM Kevelyn M wrote: Reason for CRM: Nic with [facility] is calling again to follow-up in regards of new prescription being resent for quality adjustment. [phone]

## 2023-05-24 ENCOUNTER — Other Ambulatory Visit: Payer: Self-pay | Admitting: Physician Assistant

## 2023-05-24 DIAGNOSIS — T50905A Adverse effect of unspecified drugs, medicaments and biological substances, initial encounter: Secondary | ICD-10-CM

## 2023-05-24 MED ORDER — TOUJEO SOLOSTAR 300 UNIT/ML ~~LOC~~ SOPN
PEN_INJECTOR | SUBCUTANEOUS | 0 refills | Status: DC
Start: 2023-05-24 — End: 2023-05-25

## 2023-05-24 NOTE — Addendum Note (Signed)
 Addended by: Araceli Knight on: 05/24/2023 12:38 PM   Modules accepted: Orders

## 2023-05-24 NOTE — Telephone Encounter (Signed)
 Yes can send like that. Also call patient and before he starts the 5 units lets see what libre readings have been?

## 2023-05-24 NOTE — Telephone Encounter (Signed)
 Comes as a box of 3 pens and the boxes can not be broken up. ( Can not send as just one pen) - o.k. to change to 4.6ml and resend ?

## 2023-05-24 NOTE — Telephone Encounter (Signed)
 Max 20 units a day.   I little worried about you going too low if sugars are 65 at times. For now HOLD insulin . You are just a little out of goal range and as steroid dose decreases could improve.

## 2023-05-24 NOTE — Telephone Encounter (Signed)
 ML is a stuck feature in EMR. I thought the mL was 1.5 for pen to equal 1 pen?

## 2023-05-24 NOTE — Telephone Encounter (Signed)
 Patient informed .  Will call pharmacy tomorrow to give max daily dosage for toujeo.

## 2023-05-25 ENCOUNTER — Other Ambulatory Visit: Payer: Self-pay

## 2023-05-25 ENCOUNTER — Telehealth: Payer: Self-pay | Admitting: Physician Assistant

## 2023-05-25 DIAGNOSIS — R739 Hyperglycemia, unspecified: Secondary | ICD-10-CM

## 2023-05-25 MED ORDER — TOUJEO SOLOSTAR 300 UNIT/ML ~~LOC~~ SOPN: PEN_INJECTOR | SUBCUTANEOUS | 0 refills | Status: DC

## 2023-05-25 NOTE — Telephone Encounter (Signed)
 Copied from CRM [id number]. Topic: Clinical - Prescription Issue >> May 25, 2023  2:27 PM Danelle Dunning F wrote: Reason for CRM:   Caller: Madelyne Schiff  Calling From :   [facility] - [place], [place] - [address] [place] [address] Phone: [phone] Fax: [phone] Hours: Not open 24 hours   Prescription in question : insulin  glargine, 1 Unit Dial, (TOUJEO SOLOSTAR) 300 UNIT/ML Solostar Pen  (Quantity issue) The prescription was sent over for 1.5 ml and needs to read 4.5 ML. The prescription also needs to list a  max daily unit on the prescription (for insurance purposes)

## 2023-05-25 NOTE — Telephone Encounter (Signed)
 Prescription resent with max daily dosage attached and qty changed to 4.5ml

## 2023-05-26 ENCOUNTER — Other Ambulatory Visit: Payer: Self-pay | Admitting: Physician Assistant

## 2023-05-26 NOTE — Telephone Encounter (Signed)
 See other telephone message

## 2023-05-26 NOTE — Telephone Encounter (Signed)
 Patient advised.

## 2023-05-26 NOTE — Telephone Encounter (Signed)
 Max dose 20 units. Ok for 4.5ML total but I do want patient to hold on picking up right now since sugars he is reporting are pretty good.

## 2023-05-30 ENCOUNTER — Ambulatory Visit: Payer: Self-pay | Admitting: Physician Assistant

## 2023-05-30 DIAGNOSIS — N057 Unspecified nephritic syndrome with diffuse crescentic glomerulonephritis: Secondary | ICD-10-CM | POA: Diagnosis not present

## 2023-05-30 LAB — SURGICAL PATHOLOGY

## 2023-05-30 NOTE — Telephone Encounter (Signed)
 Start the toujeo  now 5 units at bedtime. Keep Friday appt.

## 2023-05-30 NOTE — Telephone Encounter (Signed)
 Reason for Disposition . [1] Blood glucose 70  mg/dL (3.9 mmol/L) or below OR symptomatic, now improved with Care Advice AND [2] cause unknown  Answer Assessment - Initial Assessment Questions 1. SYMPTOMS: "What symptoms are you concerned about?"     Weak, not sleeping 2. ONSET:  "When did the symptoms start?"     Last night 310-315 3. BLOOD GLUCOSE: "What is your blood glucose level?"      124 - now 4. USUAL RANGE: "What is your blood glucose level usually?" (e.g., usual fasting morning value, usual evening value)     200 5. TYPE 1 or 2:  "Do you know what type of diabetes you have?"  (e.g., Type 1, Type 2, Gestational; doesn't know)      Type 2 6. INSULIN : "Do you take insulin ?" "What type of insulin (s) do you use? What is the mode of delivery? (syringe, pen; injection or pump) "When did you last give yourself an insulin  dose?" (i.e., time or hours/minutes ago) "How much did you give?" (i.e., how many units)     none 7. DIABETES PILLS: "Do you take any pills for your diabetes?" If Yes, ask: "What is the name of the medicine(s) that you take for high blood sugar?"     no 8. OTHER SYMPTOMS: "Do you have any symptoms?" (e.g., fever, frequent urination, difficulty breathing, vomiting)     weak 9. LOW BLOOD GLUCOSE TREATMENT: "What have you done so far to treat the low blood glucose level?"     ate 10. FOOD: "When did you last eat or drink?"       This morning 11. ALONE: "Are you alone right now or is someone with you?"        Yes 12. PREGNANCY: "Is there any chance you are pregnant?" "When was your last menstrual period?"       N/a  Protocols used: Diabetes - Low Blood Sugar-A-AH

## 2023-05-30 NOTE — Telephone Encounter (Signed)
 Patient informed.

## 2023-05-30 NOTE — Telephone Encounter (Signed)
  Chief Complaint: Blood glucose fluctuating. Last night 310-315, this morning 84 and now 124. Has appointment Friday. Does PCP want to see him sooner? Symptoms: feels weak Frequency: today Pertinent Negatives: Patient denies  Disposition: [] ED /[] Urgent Care (no appt availability in office) / [] Appointment(In office/virtual)/ []  [facility]/ [] Home Care/ [] Refused Recommended Disposition /[] [facility]/ [x]  Follow-up with PCP Additional Notes: Please advise pt.

## 2023-05-30 NOTE — Telephone Encounter (Signed)
 Copied from CRM [id number]. Topic: Clinical - Red Word Triage >> May 30, 2023 10:35 AM Danelle Dunning F wrote: Red Word that prompted transfer to Nurse Triage:   Blood sugar: last night 310-315  This morning then dropped to 82  Fluctuating in the 200s

## 2023-06-02 ENCOUNTER — Ambulatory Visit (INDEPENDENT_AMBULATORY_CARE_PROVIDER_SITE_OTHER): Admitting: Physician Assistant

## 2023-06-02 ENCOUNTER — Telehealth: Payer: Self-pay

## 2023-06-02 VITALS — BP 151/72 | HR 69 | Ht 68.0 in | Wt 177.0 lb

## 2023-06-02 DIAGNOSIS — N184 Chronic kidney disease, stage 4 (severe): Secondary | ICD-10-CM | POA: Diagnosis not present

## 2023-06-02 DIAGNOSIS — T50905A Adverse effect of unspecified drugs, medicaments and biological substances, initial encounter: Secondary | ICD-10-CM | POA: Diagnosis not present

## 2023-06-02 DIAGNOSIS — R739 Hyperglycemia, unspecified: Secondary | ICD-10-CM | POA: Diagnosis not present

## 2023-06-02 DIAGNOSIS — N019 Rapidly progressive nephritic syndrome with unspecified morphologic changes: Secondary | ICD-10-CM | POA: Diagnosis not present

## 2023-06-02 DIAGNOSIS — I10 Essential (primary) hypertension: Secondary | ICD-10-CM | POA: Diagnosis not present

## 2023-06-02 MED ORDER — FREESTYLE LIBRE 3 SENSOR MISC: 1.0000 | 11 refills | Status: DC

## 2023-06-02 NOTE — Telephone Encounter (Signed)
 Walter Wilkerson with [facility] pharmacy called Needing dx code for freestyle [name] dx codes as listed in patient chart  Drug-induced hyperglycemia  - Primary R73.9, T50.905A

## 2023-06-05 ENCOUNTER — Other Ambulatory Visit: Payer: Self-pay | Admitting: Physician Assistant

## 2023-06-05 DIAGNOSIS — G894 Chronic pain syndrome: Secondary | ICD-10-CM

## 2023-06-06 ENCOUNTER — Encounter: Payer: Self-pay | Admitting: Physician Assistant

## 2023-06-06 DIAGNOSIS — N1831 Chronic kidney disease, stage 3a: Secondary | ICD-10-CM | POA: Diagnosis not present

## 2023-06-06 DIAGNOSIS — N184 Chronic kidney disease, stage 4 (severe): Secondary | ICD-10-CM | POA: Insufficient documentation

## 2023-06-06 NOTE — Progress Notes (Signed)
   Established Patient Office Visit  Subjective   Patient ID: Walter Wilkerson, male    DOB: 1950-03-13  Age: 73 y.o. MRN: [id number]  Chief Complaint  Patient presents with   Medical Management of Chronic Issues    HPI Pt is a 73 yo male who presents to the clinic with his wife for 2 week follow up on elevated blood sugars and drug induced hyperglycemia due to high doses of prednisone  to treat new autoimmune kidney disease.   He is wearing a libre 3. He denies any sugars under 54. Sugars have dropped as low as 65 at night. He is concerned because every time he eats sugars spike to 200 and at times 280. He is taking 2 units of toujeo  nightly. He is not checking BP at home. He denies any CP, palpitations, headaches or vision changes.     ROS See HPI.    Objective:     BP (!) 151/72   Pulse 69   Ht 5\' 8"  (1.727 m)   Wt 177 lb (80.3 kg)   SpO2 99%   BMI 26.91 kg/m  BP Readings from Last 3 Encounters:  06/02/23 (!) 151/72  05/19/23 (!) 140/72  05/11/23 (!) 155/81   Wt Readings from Last 3 Encounters:  06/02/23 177 lb (80.3 kg)  05/19/23 184 lb (83.5 kg)  05/11/23 182 lb 12.2 oz (82.9 kg)      Physical Exam Constitutional:      Appearance: Normal appearance.  HENT:     Head: Normocephalic.  Cardiovascular:     Rate and Rhythm: Normal rate and regular rhythm.  Skin:    Comments: Non blanchable erythematous macular lesions on lower extremities.   Neurological:     General: No focal deficit present.     Mental Status: He is alert and oriented to person, place, and time.  Psychiatric:        Mood and Affect: Mood normal.         Assessment & Plan:  Aaron AasAaron AasTremane Spurgeon" was seen today for medical management of chronic issues.  Diagnoses and all orders for this visit:  Drug-induced hyperglycemia -     Continuous Glucose Sensor (FREESTYLE LIBRE 3 SENSOR) MISC; 1 each by Does not apply route every 14 (fourteen) days. Please apply for 14 days and then switch to new  sensor  Pauci-immune RPGN (rapidly progressive glomerulonephritis)  Essential hypertension  Stage 4 chronic kidney disease (HCC)   Too soon to check A1C, follow up in 2 months for A1C.  For now most of the orals would effect the kidneys and then cleanest side effect profile is insulin . Increase toujeo  by 2 units every 3-5 days until fasting glucose is 90-120 then we can start looking at meal time spikes.  Dicussed hypoglycemia and how to keep patient safe.  Continue to wear the libre 3 for CGM.  Glycemic index food chart given today.  Hoping that when he gets to taper down on prednisone  sugars will start to come down as well.  BP not to goal. He was taken off all BP medications in the hospital except for amlodipine  and carvedilol . Will defer to nephrology right now for BP control and medications.  He sees them next week.    Spent 30 minutes with patient discussing insulin  and hypoglycemia and how to treat and combat with medication and lifestyle changes.    Return in about 2 months (around 08/02/2023).    Amr Sturtevant, PA-C

## 2023-06-12 ENCOUNTER — Ambulatory Visit: Payer: Self-pay

## 2023-06-12 NOTE — Telephone Encounter (Signed)
 Can we double book for tomorrow?

## 2023-06-12 NOTE — Telephone Encounter (Signed)
 Chief Complaint: Mouth pain / Suspected Thrush Symptoms: White spots on mouth, sores on tongue, pain with swallowing, feeling like he is choking when swallowing liquids Frequency: Over last two months, worsening Pertinent Negatives: Patient denies n/a Disposition: [] ED /[] Urgent Care (no appt availability in office) / [x] Appointment(In office/virtual)/ []  [facility]/ [] Home Care/ [] Refused Recommended Disposition /[] [facility]/ []  Follow-up with PCP Additional Notes: Patient called in stating he is having mouth sores, pain with swallowing, sore throat. Patient has been on oral prednisone  for 2 months after a hospital admission. Patient symptoms appear to align with thrush. Patient educated to rinse mouth out with water after taking dose of oral steroids. Patient advised to go to ED if he feels his breathing is restricted. Patient advised to be seen today but states he will need to wait until tomorrow and will see PCP.    Copied from CRM [id number]. Topic: Clinical - Red Word Triage >> Jun 12, 2023  8:04 AM Julie Oddi wrote: Red Word that prompted transfer to Nurse Triage: Throat Pain/Mouth sores Dyanne Glass allergic reaction/difficulty swallowing Reason for Disposition  [1] SEVERE mouth pain (e.g., excruciating) AND [2] not improved after 2 hours of pain medicine  Answer Assessment - Initial Assessment Questions 1. ONSET: "When did the mouth start hurting?" (e.g., hours or days ago)      Has been progressing over the last two months 2. SEVERITY: "How bad is the pain?" (Scale 1-10; mild, moderate or severe)   - MILD (1-3):  doesn't interfere with eating or normal activities   - MODERATE (4-7): interferes with eating some solids and normal activities   - SEVERE (8-10):  excruciating pain, interferes with most normal activities   - SEVERE DYSPHAGIA: can't swallow liquids, drooling     Depending on time of day, worsened at night 3. SORES: "Are there any sores or ulcers in the  mouth?" If Yes, ask: "What part of the mouth are the sores in?"     Yes he has sores in the mouth on his tongue 4. FEVER: "Do you have a fever?" If Yes, ask: "What is your temperature, how was it measured, and when did it start?"     No  5. CAUSE: "What do you think is causing the mouth pain?"     Potential thrush - patient has been on oral steroids for 2 months 6. OTHER SYMPTOMS: "Do you have any other symptoms?" (e.g., difficulty breathing)     White patches in mouth, throat soreness, causing difficulty swallowing  Protocols used: Mouth Pain-A-AH

## 2023-06-12 NOTE — Telephone Encounter (Signed)
 FYI

## 2023-06-13 ENCOUNTER — Ambulatory Visit (INDEPENDENT_AMBULATORY_CARE_PROVIDER_SITE_OTHER): Admitting: Physician Assistant

## 2023-06-13 ENCOUNTER — Encounter: Payer: Self-pay | Admitting: Physician Assistant

## 2023-06-13 VITALS — BP 121/76 | HR 78 | Temp 98.7°F | Ht 68.0 in | Wt 170.0 lb

## 2023-06-13 DIAGNOSIS — N019 Rapidly progressive nephritic syndrome with unspecified morphologic changes: Secondary | ICD-10-CM

## 2023-06-13 DIAGNOSIS — R0989 Other specified symptoms and signs involving the circulatory and respiratory systems: Secondary | ICD-10-CM

## 2023-06-13 DIAGNOSIS — J029 Acute pharyngitis, unspecified: Secondary | ICD-10-CM | POA: Diagnosis not present

## 2023-06-13 DIAGNOSIS — I10 Essential (primary) hypertension: Secondary | ICD-10-CM | POA: Diagnosis not present

## 2023-06-13 DIAGNOSIS — T50905A Adverse effect of unspecified drugs, medicaments and biological substances, initial encounter: Secondary | ICD-10-CM | POA: Diagnosis not present

## 2023-06-13 DIAGNOSIS — R739 Hyperglycemia, unspecified: Secondary | ICD-10-CM

## 2023-06-13 DIAGNOSIS — E039 Hypothyroidism, unspecified: Secondary | ICD-10-CM | POA: Diagnosis not present

## 2023-06-13 DIAGNOSIS — R6889 Other general symptoms and signs: Secondary | ICD-10-CM

## 2023-06-13 LAB — POCT INFLUENZA A/B
Influenza A, POC: NEGATIVE
Influenza B, POC: NEGATIVE

## 2023-06-13 LAB — POC COVID19 BINAXNOW: SARS Coronavirus 2 Ag: NEGATIVE

## 2023-06-13 LAB — POCT RAPID STREP A (OFFICE): Rapid Strep A Screen: NEGATIVE

## 2023-06-13 MED ORDER — CARVEDILOL 6.25 MG PO TABS
6.2500 mg | ORAL_TABLET | Freq: Two times a day (BID) | ORAL | 1 refills | Status: DC
Start: 2023-06-13 — End: 2023-07-06

## 2023-06-13 MED ORDER — FLUCONAZOLE 150 MG PO TABS: 150.0000 mg | ORAL_TABLET | Freq: Once | ORAL | 0 refills | Status: AC

## 2023-06-13 MED ORDER — LEVOTHYROXINE SODIUM 175 MCG PO TABS: 175.0000 ug | ORAL_TABLET | Freq: Every day | ORAL | 1 refills | Status: DC

## 2023-06-13 NOTE — Patient Instructions (Signed)
 Ok to split the toujeo  dosing to 2.5units in am and 2.5units in pm Mucinex  600mg  twice a day Diflucan once

## 2023-06-14 DIAGNOSIS — E559 Vitamin D deficiency, unspecified: Secondary | ICD-10-CM | POA: Diagnosis not present

## 2023-06-14 DIAGNOSIS — N019 Rapidly progressive nephritic syndrome with unspecified morphologic changes: Secondary | ICD-10-CM | POA: Diagnosis not present

## 2023-06-14 DIAGNOSIS — R768 Other specified abnormal immunological findings in serum: Secondary | ICD-10-CM | POA: Diagnosis not present

## 2023-06-14 DIAGNOSIS — N186 End stage renal disease: Secondary | ICD-10-CM | POA: Diagnosis not present

## 2023-06-14 DIAGNOSIS — D631 Anemia in chronic kidney disease: Secondary | ICD-10-CM | POA: Diagnosis not present

## 2023-06-14 DIAGNOSIS — N189 Chronic kidney disease, unspecified: Secondary | ICD-10-CM | POA: Diagnosis not present

## 2023-06-14 DIAGNOSIS — I10 Essential (primary) hypertension: Secondary | ICD-10-CM | POA: Diagnosis not present

## 2023-06-14 DIAGNOSIS — E872 Acidosis, unspecified: Secondary | ICD-10-CM | POA: Diagnosis not present

## 2023-06-21 ENCOUNTER — Ambulatory Visit (HOSPITAL_COMMUNITY)
Admission: RE | Admit: 2023-06-21 | Discharge: 2023-06-21 | Disposition: A | Discharge status: Home / Self Care | Source: Ambulatory Visit | Attending: Vascular Surgery | Admitting: Vascular Surgery

## 2023-06-21 ENCOUNTER — Other Ambulatory Visit: Payer: Self-pay

## 2023-06-21 DIAGNOSIS — N186 End stage renal disease: Secondary | ICD-10-CM

## 2023-06-21 NOTE — Progress Notes (Signed)
 Office Note     CC: Chronic kidney disease stage V Requesting Provider:  Clementine Cutting, *  HPI: Walter Wilkerson is a 73 y.o. ([birth year]) male who presents at the request of Dr. Edson Graces with chronic kidney disease stage V.  On exam, Walter Wilkerson was doing well.  A native of [place] , he served in the [employer] in the end of [place].  He works throughout [employer], mainly in [place], and by his wife in [place] .  Walter Wilkerson has a history of lung cancer, status post resection.  Subsequent chemotherapy is believed to be the causative factor for his rapidly progressive glomerulonephritis.  Walter Wilkerson is now stage IV and needs access.  I called Dr. Edson Graces as the access plan was unclear.  Walter Wilkerson would like peritoneal dialysis, but has not had the training.  In the short-term, I was asked to place a tunneled dialysis catheter.  No prior abdominal surgery.  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 Neil Balls   VASECTOMY      Social History   Socioeconomic History   Marital status: Married    Spouse name: [name]   Number of children: 3   Years of education: 12   Highest education level: 12th grade  Occupational History   Occupation: [employer]    Comment: part time  Tobacco Use   Smoking status: Former    Current packs/day: 0.00    Average packs/day: 1 pack/day for 30.0 years (30.0 ttl pk-yrs)    Types: Cigarettes    Start date: 08/11/1974    Quit date: 08/10/2004    Years since quitting: 18.8   Smokeless tobacco: Never  Vaping Use   Vaping status: Never Used  Substance and Sexual Activity   Alcohol use: Yes    Alcohol/week: 21.0 standard drinks of alcohol    Types: 21 Shots of liquor  per week    Comment: 1-2 shots every night   Drug use: No   Sexual activity: Not Currently    Partners: Female  Other Topics Concern   Not on file  Social History Narrative   Lives with his wife and son. 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   Social Drivers of Corporate investment banker Strain: Low Risk  (04/19/2023)   Overall Financial Resource Strain (CARDIA)    Difficulty of Paying Living Expenses: Not hard at all  Food Insecurity: No Food Insecurity (04/27/2023)   Hunger Vital Sign    Worried About Running Out of Food in the Last Year: Never true    Ran Out of Food in the Last Year: Never true  Transportation Needs: No Transportation Needs (04/27/2023)   PRAPARE - Administrator, Civil Service (Medical): No    Lack of Transportation (Non-Medical): No  Physical Activity: Insufficiently Active (04/19/2023)   Exercise Vital Sign    Days of Exercise per Week: 4 days    Minutes of Exercise per Session: 20 min  Stress: No Stress Concern Present (04/19/2023)   [employer] of Occupational Health - Occupational Stress Questionnaire    Feeling of Stress : Not at all  Social Connections: Socially Integrated (04/27/2023)  Social Connection and Isolation Panel [NHANES]    Frequency of Communication with Friends and Family: Three times a week    Frequency of Social Gatherings with Friends and Family: Three times a week    Attends Religious Services: More than 4 times per year    Active Member of Clubs or Organizations: Yes    Attends Banker Meetings: More than 4 times per year    Marital Status: Married  Catering manager Violence: Not At Risk (04/27/2023)   Humiliation, Afraid, Rape, and Kick questionnaire    Fear of Current or Ex-Partner: No    Emotionally Abused: No    Physically Abused: No    Sexually Abused: No   Family History  Problem Relation Age of Onset   Heart attack Father    Hyperlipidemia Father    Hypertension Father    Heart  disease Father    Diabetes Other        grandmother     Current Outpatient Medications  Medication Sig Dispense Refill   albuterol  (VENTOLIN  HFA) 108 (90 Base) MCG/ACT inhaler Inhale 2 puffs into the lungs every 4 (four) hours as needed. (Patient taking differently: Inhale 2 puffs into the lungs every 4 (four) hours as needed for shortness of breath or wheezing.) 6.7 g 0   amLODipine  (NORVASC ) 10 MG tablet Take 10 mg by mouth daily.     aspirin EC 81 MG tablet Take 81 mg by mouth daily.     atorvastatin  (LIPITOR) 40 MG tablet Take 1 tablet (40 mg total) by mouth daily. 90 tablet 3   carvedilol  (COREG ) 6.25 MG tablet Take 1 tablet (6.25 mg total) by mouth 2 (two) times daily with a meal. 180 tablet 1   clindamycin (CLEOCIN T) 1 % external solution Apply 1 Application topically 2 (two) times daily as needed (for scalp irritation).     clobetasol cream (TEMOVATE) 0.05 % Apply 1 Application topically 2 (two) times daily as needed (for scalp irritation).     Continuous Glucose Sensor (FREESTYLE LIBRE 3 SENSOR) MISC 1 each by Does not apply route every 14 (fourteen) days. Please apply for 14 days and then switch to new sensor 2 each 11   DULoxetine  (CYMBALTA ) 30 MG capsule Take 1 capsule (30 mg total) by mouth daily. 90 capsule 1   Fluticasone -Umeclidin-Vilant (TRELEGY ELLIPTA ) 100-62.5-25 MCG/ACT AEPB Inhale 1 puff into the lungs daily in the afternoon. 60 each 5   Glucagon  (GVOKE HYPOPEN  2-PACK) 1 MG/0.2ML SOAJ Inject 1mg  pen into outer thigh or lower abdomen if blood sugar less than 54. 0.4 mL 0   insulin  glargine, 1 Unit Dial, (TOUJEO  SOLOSTAR) 300 UNIT/ML Solostar Pen Increase by 2 units every 5 days until fasting sugars 90-120. Max daily dosage 20 units 4.5 mL 0   levothyroxine  (SYNTHROID ) 175 MCG tablet Take 1 tablet (175 mcg total) by mouth daily before breakfast. 90 tablet 1   Multiple Vitamin (MULTIVITAMIN) tablet Take 1 tablet by mouth daily with breakfast.     pantoprazole  (PROTONIX ) 40 MG  tablet Take 1 tablet (40 mg total) by mouth daily. 30 tablet 1   sodium bicarbonate  650 MG tablet Take 1 tablet (650 mg total) by mouth 3 (three) times daily. 90 tablet 1   sulfamethoxazole -trimethoprim  (BACTRIM  DS) 800-160 MG tablet Take 1 tablet by mouth 3 (three) times a week. 12 tablet 1   vitamin B-12 (CYANOCOBALAMIN ) 500 MCG tablet Take 500 mcg by mouth daily.     Vitamin D , Ergocalciferol , (DRISDOL ) 1.25  MG (50000 UNIT) CAPS capsule Take 1 capsule (50,000 Units total) by mouth every 7 (seven) days. 5 capsule 0   No current facility-administered medications for this visit.    Allergies  Allergen Reactions   Pregabalin Other (See Comments)    Elevated LFTs   Lisinopril Nausea Only   Merbromin Hives   Thimerosal (Thiomersal) Hives   Ferrous Sulfate  Nausea Only and Other (See Comments)    325 mg once a day caused EXTREME NAUSEA     REVIEW OF SYSTEMS:  [X]  denotes positive finding, [ ]  denotes negative finding Cardiac  Comments:  Chest pain or chest pressure:    Shortness of breath upon exertion:    Short of breath when lying flat:    Irregular heart rhythm:        Vascular    Pain in calf, thigh, or hip brought on by ambulation:    Pain in feet at night that wakes you up from your sleep:     Blood clot in your veins:    Leg swelling:         Pulmonary    Oxygen at home:    Productive cough:     Wheezing:         Neurologic    Sudden weakness in arms or legs:     Sudden numbness in arms or legs:     Sudden onset of difficulty speaking or slurred speech:    Temporary loss of vision in one eye:     Problems with dizziness:         Gastrointestinal    Blood in stool:     Vomited blood:         Genitourinary    Burning when urinating:     Blood in urine:        Psychiatric    Major depression:         Hematologic    Bleeding problems:    Problems with blood clotting too easily:        Skin    Rashes or ulcers:        Constitutional    Fever or chills:       PHYSICAL EXAMINATION:  There were no vitals filed for this visit.  General:  WDWN in NAD; vital signs documented above Gait: Not observed HENT: WNL, normocephalic Pulmonary: normal non-labored breathing , without Rales, rhonchi,  wheezing Cardiac: regular HR, Abdomen: soft, NT, no masses Skin: without rashes Vascular Exam/Pulses:  Right Left  Radial 2+ (normal) 2+ (normal)                       Extremities: without ischemic changes, without Gangrene , without cellulitis; without open wounds;  Musculoskeletal: no muscle wasting or atrophy  Neurologic: A&O X 3;  No focal weakness or paresthesias are detected Psychiatric:  The pt has Normal affect.   Non-Invasive Vascular Imaging:     Non-Invasive Vascular Imaging:     +-----------------+-------------+----------+--------+  Right Cephalic   Diameter (cm)Depth (cm)Findings  +-----------------+-------------+----------+--------+  Shoulder            0.26                         +-----------------+-------------+----------+--------+  Prox upper arm       0.17                         +-----------------+-------------+----------+--------+  Mid  upper arm        0.10                         +-----------------+-------------+----------+--------+  Dist upper arm       0.22                         +-----------------+-------------+----------+--------+  Antecubital fossa    0.27                         +-----------------+-------------+----------+--------+  Prox forearm         0.17                         +-----------------+-------------+----------+--------+  Mid forearm          0.10                         +-----------------+-------------+----------+--------+  Wrist               0.15                         +-----------------+-------------+----------+--------+   +-----------------+-------------+----------+--------+  Right Basilic    Diameter (cm)Depth (cm)Findings   +-----------------+-------------+----------+--------+  Shoulder            0.70                         +-----------------+-------------+----------+--------+  Prox upper arm       0.31                         +-----------------+-------------+----------+--------+  Mid upper arm        0.42                         +-----------------+-------------+----------+--------+  Dist upper arm       0.21                         +-----------------+-------------+----------+--------+  Antecubital fossa    0.19                         +-----------------+-------------+----------+--------+  Prox forearm         0.14                         +-----------------+-------------+----------+--------+  Mid forearm          0.14                         +-----------------+-------------+----------+--------+  Wrist               0.13                         +-----------------+-------------+----------+--------+   +-----------------+-------------+----------+--------+  Left Cephalic    Diameter (cm)Depth (cm)Findings  +-----------------+-------------+----------+--------+  Shoulder            0.10                         +-----------------+-------------+----------+--------+  Prox upper arm       0.08                         +-----------------+-------------+----------+--------+  Mid upper arm        0.08                         +-----------------+-------------+----------+--------+  Dist upper arm       0.16                         +-----------------+-------------+----------+--------+  Antecubital fossa    0.09                         +-----------------+-------------+----------+--------+  Prox forearm         0.10                         +-----------------+-------------+----------+--------+  Mid forearm          0.10                         +-----------------+-------------+----------+--------+  Wrist               0.09                          +-----------------+-------------+----------+--------+   +-----------------+-------------+----------+--------+  Left Basilic     Diameter (cm)Depth (cm)Findings  +-----------------+-------------+----------+--------+  Shoulder            0.42                         +-----------------+-------------+----------+--------+  Prox upper arm       0.21                         +-----------------+-------------+----------+--------+  Mid upper arm        0.23                         +-----------------+-------------+----------+--------+  Dist upper arm       0.21                         +-----------------+-------------+----------+--------+  Antecubital fossa    0.17                         +-----------------+-------------+----------+--------+  Prox forearm         0.15                         +-----------------+-------------+----------+--------+  Mid forearm          0.10                         +-----------------+-------------+----------+--------+  Wrist               0.09                         +-----------------+-------------+----------+--------+   Summary: Right: Patent cephalic and basilic veins, with measurements as         noted above.  Left: Patent cephalic and basilic veins, with measurements as        noted above.   *See table(s) above for measurements and observations.    Diagnosing physician:     ASSESSMENT/PLAN:: 73 y.o. male presenting with chronic  kidney disease stage V in need of dialysis.  I discussed his case with Dr. Edson Graces.  He asked for tunneled dialysis catheter while the patient undergoes peritoneal dialysis education and training with plans to transition to peritoneal dialysis in the future.  I had a long discussion with Walter Wilkerson including the risks and benefits of tunneled dialysis catheter placement.  After discussing these, he elected to proceed.  I offered to do the case in the Cath Lab as well as in the  operating room under heavier anesthesia.  He elected to proceed with tunneled dialysis catheter placement in the operating room.    Kayla Part, MD [facility] [phone]

## 2023-06-22 ENCOUNTER — Other Ambulatory Visit: Payer: Self-pay

## 2023-06-22 ENCOUNTER — Encounter: Payer: Self-pay | Admitting: Vascular Surgery

## 2023-06-22 ENCOUNTER — Ambulatory Visit: Attending: Vascular Surgery | Admitting: Vascular Surgery

## 2023-06-22 VITALS — BP 124/71 | HR 74 | Temp 98.4°F | Resp 18 | Ht 68.0 in | Wt 174.3 lb

## 2023-06-22 DIAGNOSIS — N186 End stage renal disease: Secondary | ICD-10-CM

## 2023-06-22 DIAGNOSIS — N185 Chronic kidney disease, stage 5: Secondary | ICD-10-CM

## 2023-06-23 DIAGNOSIS — Z902 Acquired absence of lung [part of]: Secondary | ICD-10-CM | POA: Insufficient documentation

## 2023-06-23 DIAGNOSIS — Z992 Dependence on renal dialysis: Secondary | ICD-10-CM | POA: Insufficient documentation

## 2023-06-26 ENCOUNTER — Encounter (HOSPITAL_COMMUNITY): Payer: Self-pay | Admitting: Vascular Surgery

## 2023-06-26 ENCOUNTER — Other Ambulatory Visit: Payer: Self-pay

## 2023-06-26 NOTE — Progress Notes (Signed)
 PCP - Araceli Knight, PA-C  Cardiologist -   PPM/ICD - denies Device Orders - n/a Rep Notified - n/a  Chest x-ray - 05-11-23 EKG - 05-11-23 Stress Test - 09-08-21 ECHO - 04-28-23 Cardiac Cath -   CPAP - Uses nightly   GLP-1 -denies  Fasting Blood Sugar - per patient blood sugars have been low in the mornings Checks Blood Sugar Lebre Freestyle/  Blood Thinner Instructions: denies Aspirin Instructions: n/a  ERAS Protcol - NPO  COVID TEST- n/a  Anesthesia review: no  Patient verbally denies any shortness of breath, fever, cough and chest pain during phone call   -------------  SDW INSTRUCTIONS given:  Your procedure is scheduled on JUNE 17. 2025.  Report to [facility] Main Entrance A at 5:30 A.M., and check in at the Admitting office.  Call this number if you have problems the morning of surgery:  [phone]   Remember:  Do not eat or drink  after midnight the night before your surgery   Take these medicines the morning of surgery with A SIP OF WATER  albuterol  (VENTOLIN  HFA)  aspirin  amLODipine  (NORVASC )  atorvastatin   carvedilol   DULoxetine  (CYMBALTA )  (TRELEGY ELLIPTA )  levothyroxine  (SYNTHROID )  Glucagon   pantoprazole  (PROTONIX )  predniSONE  (DELTASONE )   As of today, STOP taking any Aspirin (unless otherwise instructed by your surgeon) Aleve, Naproxen, Ibuprofen , Motrin , Advil , Goody's, BC's, all herbal medications, fish oil, and all vitamins. WHAT DO I DO ABOUT MY DIABETES MEDICATION?   Do not take oral diabetes medicines (pills) the morning of surgery.  THE NIGHT BEFORE SURGERY, take _____1/2______ units of ___________insulin.       THE MORNING OF SURGERY, take _____________ units of __________insulin.  The day of surgery, do not take other diabetes injectables, including Byetta (exenatide), Bydureon (exenatide ER), Victoza (liraglutide), or Trulicity (dulaglutide).  If your CBG is greater than 220 mg/dL, you may take  of your sliding scale  (correction) dose of insulin .   HOW TO MANAGE YOUR DIABETES BEFORE AND AFTER SURGERY  Why is it important to control my blood sugar before and after surgery? Improving blood sugar levels before and after surgery helps healing and can limit problems. A way of improving blood sugar control is eating a healthy diet by:  Eating less sugar and carbohydrates  Increasing activity/exercise  Talking with your doctor about reaching your blood sugar goals High blood sugars (greater than 180 mg/dL) can raise your risk of infections and slow your recovery, so you will need to focus on controlling your diabetes during the weeks before surgery. Make sure that the doctor who takes care of your diabetes knows about your planned surgery including the date and location.  How do I manage my blood sugar before surgery? Check your blood sugar at least 4 times a day, starting 2 days before surgery, to make sure that the level is not too high or low.  Check your blood sugar the morning of your surgery when you wake up and every 2 hours until you get to the Short Stay unit.  If your blood sugar is less than 70 mg/dL, you will need to treat for low blood sugar: Do not take insulin . Treat a low blood sugar (less than 70 mg/dL) with  cup of clear juice (cranberry or apple), 4 glucose tablets, OR glucose gel. Recheck blood sugar in 15 minutes after treatment (to make sure it is greater than 70 mg/dL). If your blood sugar is not greater than 70 mg/dL on recheck,  call [phone] for further instructions. Report your blood sugar to the short stay nurse when you get to Short Stay.  If you are admitted to the hospital after surgery: Your blood sugar will be checked by the staff and you will probably be given insulin  after surgery (instead of oral diabetes medicines) to make sure you have good blood sugar levels. The goal for blood sugar control after surgery is 80-180 mg/dL.                  Do not wear jewelry, make  up, or nail polish            Do not wear lotions, powders, perfumes/colognes, or deodorant.            Do not shave 48 hours prior to surgery.  Men may shave face and neck.            Do not bring valuables to the hospital.            [facility] is not responsible for any belongings or valuables.  Do NOT Smoke (Tobacco/Vaping) 24 hours prior to your procedure If you use a CPAP at night, you may bring all equipment for your overnight stay.   Contacts, glasses, dentures or bridgework may not be worn into surgery.      For patients admitted to the hospital, discharge time will be determined by your treatment team.   Patients discharged the day of surgery will not be allowed to drive home, and someone needs to stay with them for 24 hours.    Special instructions:   [place]- Preparing For Surgery  Before surgery, you can play an important role. Because skin is not sterile, your skin needs to be as free of germs as possible. You can reduce the number of germs on your skin by washing with CHG (chlorahexidine gluconate) Soap before surgery.  CHG is an antiseptic cleaner which kills germs and bonds with the skin to continue killing germs even after washing.    Oral Hygiene is also important to reduce your risk of infection.  Remember - BRUSH YOUR TEETH THE MORNING OF SURGERY WITH YOUR REGULAR TOOTHPASTE  Please do not use if you have an allergy to CHG or antibacterial soaps. If your skin becomes reddened/irritated stop using the CHG.  Do not shave (including legs and underarms) for at least 48 hours prior to first CHG shower. It is OK to shave your face.  Please follow these instructions carefully.   Shower the NIGHT BEFORE SURGERY and the MORNING OF SURGERY with DIAL Soap.   Pat yourself dry with a CLEAN TOWEL.  Wear CLEAN PAJAMAS to bed the night before surgery  Place CLEAN SHEETS on your bed the night of your first shower and DO NOT SLEEP WITH PETS.   Day of Surgery: Please  shower morning of surgery  Wear Clean/Comfortable clothing the morning of surgery Do not apply any deodorants/lotions.   Remember to brush your teeth WITH YOUR REGULAR TOOTHPASTE.   Questions were answered. Patient verbalized understanding of instructions.

## 2023-06-26 NOTE — Anesthesia Preprocedure Evaluation (Signed)
 Anesthesia Evaluation  Patient identified by MRN, date of birth, ID band Patient awake    Reviewed: Allergy & Precautions, H&P , NPO status , Patient's Chart, lab work & pertinent test results  Airway Mallampati: II  TM Distance: >3 FB Neck ROM: Full    Dental no notable dental hx. (+) Teeth Intact, Dental Advisory Given   Pulmonary sleep apnea , COPD,  COPD inhaler, former smoker   Pulmonary exam normal breath sounds clear to auscultation       Cardiovascular Exercise Tolerance: Good hypertension, Pt. on medications and Pt. on home beta blockers  Rhythm:Regular Rate:Normal     Neuro/Psych negative neurological ROS  negative psych ROS   GI/Hepatic negative GI ROS, Neg liver ROS,,,  Endo/Other  Hypothyroidism    Renal/GU ESRF and DialysisRenal disease  negative genitourinary   Musculoskeletal  (+) Arthritis , Osteoarthritis,    Abdominal   Peds  Hematology  (+) Blood dyscrasia, anemia   Anesthesia Other Findings   Reproductive/Obstetrics negative OB ROS                             Anesthesia Physical Anesthesia Plan  ASA: 3  Anesthesia Plan: General   Post-op Pain Management: Tylenol  PO (pre-op )*   Induction: Intravenous  PONV Risk Score and Plan: 3 and Ondansetron , Dexamethasone, Propofol infusion and TIVA  Airway Management Planned: LMA  Additional Equipment:   Intra-op Plan:   Post-operative Plan: Extubation in OR  Informed Consent: I have reviewed the patients History and Physical, chart, labs and discussed the procedure including the risks, benefits and alternatives for the proposed anesthesia with the patient or authorized representative who has indicated his/her understanding and acceptance.     Dental advisory given  Plan Discussed with: CRNA  Anesthesia Plan Comments:        Anesthesia Quick Evaluation

## 2023-06-26 NOTE — Progress Notes (Signed)
 Acute Office Visit  Subjective:     Patient ID: Walter Wilkerson, male    DOB: December 27, 1950, 73 y.o.   MRN: [id number]  Chief Complaint  Patient presents with   Cough    Sore throat/blisters    HPI Patient is in today for sore throat and congestion every morning. 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 He feels like throat is red and blisters at times come up. Denies any fever, chills, body aches.   He admits to some night time lows in 60s at night. Taking 5 units of toujeo  at bedtime.   ROS See HPI.     Objective:    BP 121/76   Pulse 78   Temp 98.7 F (37.1 C) (Oral)   Ht 5' 8 (1.727 m)   Wt 170 lb (77.1 kg)   SpO2 99%   BMI 25.85 kg/m  BP Readings from Last 3 Encounters:  06/22/23 124/71  06/13/23 121/76  06/02/23 (!) 151/72   Wt Readings from Last 3 Encounters:  06/22/23 174 lb 4.8 oz (79.1 kg)  06/13/23 170 lb (77.1 kg)  06/02/23 177 lb (80.3 kg)      Physical Exam Constitutional:      Appearance: Normal appearance.  HENT:     Head: Normocephalic.     Right Ear: Tympanic membrane, ear canal and external ear normal. There is no impacted cerumen.     Left Ear: Tympanic membrane, ear canal and external ear normal. There is no impacted cerumen.     Nose: Nose normal.     Mouth/Throat:     Mouth: Mucous membranes are moist.     Pharynx: Posterior oropharyngeal erythema present. No oropharyngeal exudate.     Comments: No ulcers or exudate noted on exam today.   Eyes:     Conjunctiva/sclera: Conjunctivae normal.    Cardiovascular:     Rate and Rhythm: Normal rate and regular rhythm.  Pulmonary:     Effort: Pulmonary effort is normal.     Breath sounds: Normal breath sounds.   Musculoskeletal:     Cervical back: Normal range of motion and neck supple. No tenderness.  Lymphadenopathy:     Cervical: No cervical adenopathy.   Neurological:     General: No focal deficit present.     Mental Status: He  is alert and oriented to person, place, and time.   Psychiatric:        Mood and Affect: Mood normal.     Results for orders placed or performed in visit on 06/13/23  POC COVID-19  Result Value Ref Range   SARS Coronavirus 2 Ag Negative Negative  POCT rapid strep A  Result Value Ref Range   Rapid Strep A Screen Negative Negative  POCT Influenza A/B  Result Value Ref Range   Influenza A, POC Negative Negative   Influenza B, POC Negative Negative        Assessment & Plan:  Walter [facility] A[facility] Wilkerson was seen today for cough.  Diagnoses and all orders for this visit:  Sore throat -     POC COVID-19 -     POCT rapid strep A -     POCT Influenza A/B -     fluconazole  (DIFLUCAN ) 150 MG tablet; Take 1 tablet (150 mg total) by mouth once for 1 dose.  Symptoms of upper respiratory infection (URI) -     POC COVID-19 -     POCT rapid strep A -  POCT Influenza A/B  Pauci-immune RPGN (rapidly progressive glomerulonephritis)  Essential hypertension -     carvedilol  (COREG ) 6.25 MG tablet; Take 1 tablet (6.25 mg total) by mouth 2 (two) times daily with a meal.  Acquired hypothyroidism -     levothyroxine  (SYNTHROID ) 175 MCG tablet; Take 1 tablet (175 mcg total) by mouth daily before breakfast.  Drug-induced hyperglycemia   Negative for flu/covid/strep  No red flag signs on exam Pt is taking a lot of prednisone  and using inhaled corticosteroid as well ? Thrush Sent dilfucan for trial Mucinex  for congestion and throat clearing  Ok to split toujeo  am and pm if getting lows Keep follow up with A1c Refilled thyroid  and HTN medications today   Dorethy Tomey, PA-C

## 2023-06-27 ENCOUNTER — Encounter (HOSPITAL_COMMUNITY): Payer: Self-pay | Admitting: Vascular Surgery

## 2023-06-27 ENCOUNTER — Ambulatory Visit (HOSPITAL_COMMUNITY)
Admission: RE | Admit: 2023-06-27 | Discharge: 2023-06-27 | Disposition: A | Discharge status: Home / Self Care | Source: Home / Self Care | Attending: Vascular Surgery | Admitting: Vascular Surgery

## 2023-06-27 ENCOUNTER — Ambulatory Visit (HOSPITAL_COMMUNITY)

## 2023-06-27 ENCOUNTER — Other Ambulatory Visit: Payer: Self-pay

## 2023-06-27 ENCOUNTER — Encounter (HOSPITAL_COMMUNITY)
Admission: RE | Disposition: A | Discharge status: Home / Self Care | Payer: Self-pay | Source: Home / Self Care | Attending: Vascular Surgery

## 2023-06-27 DIAGNOSIS — Z87891 Personal history of nicotine dependence: Secondary | ICD-10-CM

## 2023-06-27 DIAGNOSIS — Z85118 Personal history of other malignant neoplasm of bronchus and lung: Secondary | ICD-10-CM | POA: Insufficient documentation

## 2023-06-27 DIAGNOSIS — Z4901 Encounter for fitting and adjustment of extracorporeal dialysis catheter: Secondary | ICD-10-CM | POA: Diagnosis not present

## 2023-06-27 DIAGNOSIS — G4730 Sleep apnea, unspecified: Secondary | ICD-10-CM | POA: Diagnosis not present

## 2023-06-27 DIAGNOSIS — I120 Hypertensive chronic kidney disease with stage 5 chronic kidney disease or end stage renal disease: Secondary | ICD-10-CM | POA: Diagnosis not present

## 2023-06-27 DIAGNOSIS — J449 Chronic obstructive pulmonary disease, unspecified: Secondary | ICD-10-CM | POA: Insufficient documentation

## 2023-06-27 DIAGNOSIS — Z452 Encounter for adjustment and management of vascular access device: Secondary | ICD-10-CM | POA: Diagnosis not present

## 2023-06-27 DIAGNOSIS — J189 Pneumonia, unspecified organism: Secondary | ICD-10-CM | POA: Diagnosis not present

## 2023-06-27 DIAGNOSIS — E039 Hypothyroidism, unspecified: Secondary | ICD-10-CM | POA: Diagnosis not present

## 2023-06-27 DIAGNOSIS — I700 Atherosclerosis of aorta: Secondary | ICD-10-CM | POA: Diagnosis not present

## 2023-06-27 DIAGNOSIS — Z9221 Personal history of antineoplastic chemotherapy: Secondary | ICD-10-CM | POA: Insufficient documentation

## 2023-06-27 DIAGNOSIS — Z992 Dependence on renal dialysis: Secondary | ICD-10-CM | POA: Diagnosis not present

## 2023-06-27 DIAGNOSIS — R0603 Acute respiratory distress: Secondary | ICD-10-CM | POA: Diagnosis not present

## 2023-06-27 DIAGNOSIS — N186 End stage renal disease: Secondary | ICD-10-CM

## 2023-06-27 DIAGNOSIS — R9389 Abnormal findings on diagnostic imaging of other specified body structures: Secondary | ICD-10-CM | POA: Diagnosis not present

## 2023-06-27 HISTORY — DX: Sleep apnea, unspecified: G47.30

## 2023-06-27 HISTORY — PX: INSERTION OF DIALYSIS CATHETER: SHX1324

## 2023-06-27 HISTORY — DX: Hypothyroidism, unspecified: E03.9

## 2023-06-27 HISTORY — DX: Inflammatory liver disease, unspecified: K75.9

## 2023-06-27 LAB — POCT I-STAT, CHEM 8
BUN: 110 mg/dL — ABNORMAL HIGH (ref 8–23)
Calcium, Ion: 1.06 mmol/L — ABNORMAL LOW (ref 1.15–1.40)
Chloride: 106 mmol/L (ref 98–111)
Creatinine, Ser: 6.8 mg/dL — ABNORMAL HIGH (ref 0.61–1.24)
Glucose, Bld: 99 mg/dL (ref 70–99)
HCT: 22 % — ABNORMAL LOW (ref 39.0–52.0)
Hemoglobin: 7.5 g/dL — ABNORMAL LOW (ref 13.0–17.0)
Potassium: 3.8 mmol/L (ref 3.5–5.1)
Sodium: 138 mmol/L (ref 135–145)
TCO2: 17 mmol/L — ABNORMAL LOW (ref 22–32)

## 2023-06-27 LAB — GLUCOSE, CAPILLARY
Glucose-Capillary: 102 mg/dL — ABNORMAL HIGH (ref 70–99)
Glucose-Capillary: 130 mg/dL — ABNORMAL HIGH (ref 70–99)

## 2023-06-27 LAB — TYPE AND SCREEN
ABO/RH(D): A POS
Antibody Screen: NEGATIVE

## 2023-06-27 SURGERY — INSERTION OF DIALYSIS CATHETER
Anesthesia: General

## 2023-06-27 MED ORDER — CHLORHEXIDINE GLUCONATE CLOTH 2 % EX PADS: 6.0000 | MEDICATED_PAD | Freq: Every day | CUTANEOUS | Status: DC

## 2023-06-27 MED ORDER — ORAL CARE MOUTH RINSE: 15.0000 mL | Freq: Once | OROMUCOSAL | Status: AC

## 2023-06-27 MED ORDER — PROPOFOL 10 MG/ML IV BOLUS
INTRAVENOUS | Status: AC
  Filled 2023-06-27: qty 20

## 2023-06-27 MED ORDER — SODIUM CHLORIDE 0.9% FLUSH: 10.0000 mL | Freq: Two times a day (BID) | INTRAVENOUS | Status: DC

## 2023-06-27 MED ORDER — LIDOCAINE 2% (20 MG/ML) 5 ML SYRINGE
INTRAMUSCULAR | Status: DC | PRN
  Administered 2023-06-27: 60 mg via INTRAVENOUS

## 2023-06-27 MED ORDER — ACETAMINOPHEN 500 MG PO TABS
1000.0000 mg | ORAL_TABLET | Freq: Once | ORAL | Status: AC
  Administered 2023-06-27: 1000 mg via ORAL
  Filled 2023-06-27: qty 2

## 2023-06-27 MED ORDER — SODIUM CHLORIDE 0.9% FLUSH: 3.0000 mL | INTRAVENOUS | Status: DC | PRN

## 2023-06-27 MED ORDER — SODIUM CHLORIDE 0.9% FLUSH: 10.0000 mL | INTRAVENOUS | Status: DC | PRN

## 2023-06-27 MED ORDER — PHENYLEPHRINE HCL-NACL 20-0.9 MG/250ML-% IV SOLN
INTRAVENOUS | Status: DC | PRN
  Administered 2023-06-27: 30 ug/min via INTRAVENOUS

## 2023-06-27 MED ORDER — SODIUM CHLORIDE 0.9 % IV SOLN: INTRAVENOUS | Status: DC

## 2023-06-27 MED ORDER — CEFAZOLIN SODIUM-DEXTROSE 2-4 GM/100ML-% IV SOLN
2.0000 g | INTRAVENOUS | Status: AC
  Administered 2023-06-27: 2 g via INTRAVENOUS
  Filled 2023-06-27: qty 100

## 2023-06-27 MED ORDER — CHLORHEXIDINE GLUCONATE 4 % EX SOLN: 60.0000 mL | Freq: Once | CUTANEOUS | Status: DC

## 2023-06-27 MED ORDER — HEPARIN SODIUM (PORCINE) 1000 UNIT/ML IJ SOLN
INTRAMUSCULAR | Status: AC
  Filled 2023-06-27: qty 4

## 2023-06-27 MED ORDER — GLYCOPYRROLATE 0.2 MG/ML IJ SOLN
INTRAMUSCULAR | Status: DC | PRN
  Administered 2023-06-27: .1 mg via INTRAVENOUS

## 2023-06-27 MED ORDER — CHLORHEXIDINE GLUCONATE 0.12 % MT SOLN
15.0000 mL | Freq: Once | OROMUCOSAL | Status: AC
  Administered 2023-06-27: 15 mL via OROMUCOSAL
  Filled 2023-06-27: qty 15

## 2023-06-27 MED ORDER — FENTANYL CITRATE (PF) 250 MCG/5ML IJ SOLN
INTRAMUSCULAR | Status: AC
  Filled 2023-06-27: qty 5

## 2023-06-27 MED ORDER — HEPARIN SODIUM (PORCINE) 1000 UNIT/ML IJ SOLN: INTRAMUSCULAR | Status: DC | PRN

## 2023-06-27 MED ORDER — DEXAMETHASONE SODIUM PHOSPHATE 10 MG/ML IJ SOLN
INTRAMUSCULAR | Status: DC | PRN
  Administered 2023-06-27: 10 mg via INTRAVENOUS

## 2023-06-27 MED ORDER — PHENYLEPHRINE 80 MCG/ML (10ML) SYRINGE FOR IV PUSH (FOR BLOOD PRESSURE SUPPORT)
PREFILLED_SYRINGE | INTRAVENOUS | Status: DC | PRN
  Administered 2023-06-27 (×2): 80 ug via INTRAVENOUS

## 2023-06-27 MED ORDER — HEPARIN 6000 UNIT IRRIGATION SOLUTION
Status: AC
Start: 2023-06-27 — End: 2023-06-27
  Filled 2023-06-27: qty 500

## 2023-06-27 MED ORDER — ONDANSETRON HCL 4 MG/2ML IJ SOLN
INTRAMUSCULAR | Status: DC | PRN
  Administered 2023-06-27: 4 mg via INTRAVENOUS

## 2023-06-27 MED ORDER — LIDOCAINE-EPINEPHRINE (PF) 1 %-1:200000 IJ SOLN
INTRAMUSCULAR | Status: AC
  Filled 2023-06-27: qty 30

## 2023-06-27 MED ORDER — HEPARIN 6000 UNIT IRRIGATION SOLUTION
Status: DC | PRN
  Administered 2023-06-27: 1

## 2023-06-27 MED ORDER — INSULIN ASPART 100 UNIT/ML IJ SOLN: 0.0000 [IU] | INTRAMUSCULAR | Status: DC | PRN

## 2023-06-27 MED ORDER — HEPARIN SODIUM (PORCINE) 1000 UNIT/ML IJ SOLN
1600.0000 [IU] | INTRAMUSCULAR | Status: DC | PRN
  Administered 2023-06-27 (×2): 1600 [IU]

## 2023-06-27 MED ORDER — HEPARIN SOD (PORK) LOCK FLUSH 100 UNIT/ML IV SOLN
INTRAVENOUS | Status: AC
Start: 2023-06-27 — End: 2023-06-27
  Filled 2023-06-27: qty 5

## 2023-06-27 MED ORDER — FENTANYL CITRATE (PF) 250 MCG/5ML IJ SOLN
INTRAMUSCULAR | Status: DC | PRN
  Administered 2023-06-27: 50 ug via INTRAVENOUS

## 2023-06-27 MED ORDER — MIDAZOLAM HCL 2 MG/2ML IJ SOLN
INTRAMUSCULAR | Status: AC
  Filled 2023-06-27: qty 2

## 2023-06-27 MED ORDER — LACTATED RINGERS IV SOLN: INTRAVENOUS | Status: DC

## 2023-06-27 MED ORDER — FENTANYL CITRATE (PF) 100 MCG/2ML IJ SOLN
25.0000 ug | INTRAMUSCULAR | Status: DC | PRN
  Administered 2023-06-27 (×4): 25 ug via INTRAVENOUS

## 2023-06-27 MED ORDER — HEPARIN SOD (PORK) LOCK FLUSH 100 UNIT/ML IV SOLN
INTRAVENOUS | Status: DC | PRN
  Administered 2023-06-27: 320 [IU] via INTRAVENOUS

## 2023-06-27 MED ORDER — MIDAZOLAM HCL 2 MG/2ML IJ SOLN
INTRAMUSCULAR | Status: DC | PRN
  Administered 2023-06-27 (×2): 1 mg via INTRAVENOUS

## 2023-06-27 MED ORDER — PROPOFOL 10 MG/ML IV BOLUS
INTRAVENOUS | Status: DC | PRN
  Administered 2023-06-27: 120 mg via INTRAVENOUS
  Administered 2023-06-27: 20 mg via INTRAVENOUS

## 2023-06-27 MED ORDER — 0.9 % SODIUM CHLORIDE (POUR BTL) OPTIME
TOPICAL | Status: DC | PRN
  Administered 2023-06-27: 1000 mL

## 2023-06-27 MED ORDER — FENTANYL CITRATE (PF) 100 MCG/2ML IJ SOLN
INTRAMUSCULAR | Status: AC
  Filled 2023-06-27: qty 2

## 2023-06-27 SURGICAL SUPPLY — 42 items
BAG COUNTER SPONGE SURGICOUNT (BAG) ×1 IMPLANT
BAG DECANTER FOR FLEXI CONT (MISCELLANEOUS) ×1 IMPLANT
BIOPATCH RED 1 DISK 7.0 (GAUZE/BANDAGES/DRESSINGS) ×1 IMPLANT
BIOPATCH WHT 1IN DISK W/4.0 H (GAUZE/BANDAGES/DRESSINGS) IMPLANT
CATH PALINDROME-P 19CM W/VT (CATHETERS) IMPLANT
CATH PALINDROME-P 28CM W/VT (CATHETERS) IMPLANT
CATH STRAIGHT 5FR 65CM (CATHETERS) IMPLANT
CLIP TI MEDIUM 6 (CLIP) ×1 IMPLANT
CLIP TI WIDE RED SMALL 6 (CLIP) ×1 IMPLANT
COVER PROBE W GEL 5X96 (DRAPES) ×1 IMPLANT
COVER SURGICAL LIGHT HANDLE (MISCELLANEOUS) ×1 IMPLANT
DERMABOND ADVANCED .7 DNX12 (GAUZE/BANDAGES/DRESSINGS) ×1 IMPLANT
DRAPE C-ARM 42X72 X-RAY (DRAPES) ×1 IMPLANT
DRAPE CHEST BREAST 15X10 FENES (DRAPES) ×1 IMPLANT
DRSG TEGADERM 4X10 (GAUZE/BANDAGES/DRESSINGS) IMPLANT
GAUZE 4X4 16PLY ~~LOC~~+RFID DBL (SPONGE) ×1 IMPLANT
GLOVE BIOGEL PI IND STRL 8 (GLOVE) ×1 IMPLANT
GOWN STRL REUS W/ TWL LRG LVL3 (GOWN DISPOSABLE) ×2 IMPLANT
GOWN STRL REUS W/TWL 2XL LVL3 (GOWN DISPOSABLE) ×2 IMPLANT
KIT BASIN OR (CUSTOM PROCEDURE TRAY) ×1 IMPLANT
KIT TURNOVER KIT B (KITS) ×1 IMPLANT
NDL 18GX1X1/2 (RX/OR ONLY) (NEEDLE) ×1 IMPLANT
NDL HYPO 25GX1X1/2 BEV (NEEDLE) ×1 IMPLANT
NEEDLE 18GX1X1/2 (RX/OR ONLY) (NEEDLE) ×1 IMPLANT
NEEDLE HYPO 25GX1X1/2 BEV (NEEDLE) ×1 IMPLANT
NS IRRIG 1000ML POUR BTL (IV SOLUTION) ×1 IMPLANT
PACK SRG BSC III STRL LF ECLPS (CUSTOM PROCEDURE TRAY) ×1 IMPLANT
PAD ARMBOARD POSITIONER FOAM (MISCELLANEOUS) ×2 IMPLANT
SET MICROPUNCTURE 5F STIFF (MISCELLANEOUS) IMPLANT
SOAP 2 % CHG 4 OZ (WOUND CARE) ×1 IMPLANT
SPIKE FLUID TRANSFER (MISCELLANEOUS) ×1 IMPLANT
SUT ETHILON 3 0 PS 1 (SUTURE) ×1 IMPLANT
SUT MNCRL AB 4-0 PS2 18 (SUTURE) ×1 IMPLANT
SYR 10ML LL (SYRINGE) ×1 IMPLANT
SYR 20ML LL LF (SYRINGE) ×2 IMPLANT
SYR 5ML LL (SYRINGE) ×1 IMPLANT
SYR CONTROL 10ML LL (SYRINGE) ×1 IMPLANT
TAPE CLOTH SURG 4X10 WHT LF (GAUZE/BANDAGES/DRESSINGS) IMPLANT
TOWEL GREEN STERILE (TOWEL DISPOSABLE) ×1 IMPLANT
TOWEL GREEN STERILE FF (TOWEL DISPOSABLE) ×1 IMPLANT
WATER STERILE IRR 1000ML POUR (IV SOLUTION) ×1 IMPLANT
WIRE AMPLATZ SS-J .035X180CM (WIRE) IMPLANT

## 2023-06-27 NOTE — Anesthesia Postprocedure Evaluation (Signed)
 Anesthesia Post Note  Patient: Walter Wilkerson  Procedure(s) Performed: INSERTION OF DIALYSIS CATHETER     Patient location during evaluation: PACU Anesthesia Type: General Level of consciousness: awake and alert Pain management: pain level controlled Vital Signs Assessment: post-procedure vital signs reviewed and stable Respiratory status: spontaneous breathing, nonlabored ventilation and respiratory function stable Cardiovascular status: blood pressure returned to baseline and stable Postop Assessment: no apparent nausea or vomiting Anesthetic complications: no  No notable events documented.  Last Vitals:  Vitals:   06/27/23 0915 06/27/23 0930  BP: 126/61 125/69  Pulse: 65 67  Resp: 13 15  Temp:  36.6 C  SpO2: 95% 96%    Last Pain:  Vitals:   06/27/23 0819  TempSrc:   PainSc: Asleep                 Amori Colomb,W. EDMOND

## 2023-06-27 NOTE — Op Note (Signed)
    NAME: Walter Wilkerson    MRN: [id number] DOB: 1950-05-30    DATE OF OPERATION: 06/27/2023  PREOP DIAGNOSIS:    End stage renal disease in need of dialysis   POSTOP DIAGNOSIS:    Same  PROCEDURE:    Right internal jugular tunneled dialysis catheter 19cm palindrome  SURGEON: Kayla Part  ASSIST: none  ANESTHESIA: General   EBL: 5ml  INDICATIONS:    Walter Wilkerson is a 73 y.o. male with end stage renal disease in need of long term HD access.   FINDINGS:    Left internal jugular tunneled line placed at the atriocaval junction  TECHNIQUE:   Using ultrasound guidance the right internal jugular vein was accessed with micropuncture technique.  Through the micropuncture sheath a floppy J-wire was advanced into the superior vena cava.  A small incision was made around the skin access point.  A counterincision was made in the chest under the clavicle.  A 19 cm tunneled dialysis catheter was then tunneled under the skin, over the clavicle into the incision in the neck.  The access point was serially dilated under direct fluoroscopic guidance.  A peel-away sheath was introduced into the superior vena cava under fluoroscopic guidance.  The tunneling device was removed and the catheter fed through the peel-away sheath into the superior vena cava.  The peel-away sheath was removed and the catheter gently pulled back.  Adequate position was confirmed with x-ray.  The catheter was tested and found to flush and draw back well.  Catheter was heparin  locked.  Caps were applied.  Catheter was sutured to the skin.  The neck incision was closed with 4-0 Monocryl.   Kayla Part, MD [facility] DATE OF DICTATION:   06/27/2023

## 2023-06-27 NOTE — Discharge Instructions (Signed)
 SHOWER IS CONTRAINDICATED. YOU HAVE A DIALYSIS CATHETER.

## 2023-06-27 NOTE — Anesthesia Procedure Notes (Signed)
 Procedure Name: LMA Insertion Date/Time: 06/27/2023 7:37 AM  Performed by: Cass Vandermeulen, CRNAPre-anesthesia Checklist: Patient identified, Emergency Drugs available, Suction available and Patient being monitored Patient Re-evaluated:Patient Re-evaluated prior to induction Oxygen Delivery Method: Circle System Utilized Preoxygenation: Pre-oxygenation with 100% oxygen Induction Type: IV induction Ventilation: Mask ventilation without difficulty LMA: LMA inserted LMA Size: 4.0 Number of attempts: 1 Airway Equipment and Method: Bite block Placement Confirmation: positive ETCO2 Tube secured with: Tape Dental Injury: Teeth and Oropharynx as per pre-operative assessment  Comments: Head and neck midline. Lips, teeth, and tongue unchanged.

## 2023-06-27 NOTE — H&P (Signed)
 Patient seen and examined in preop holding.  No complaints. No changes to medication history or physical exam since last seen in clinic. After discussing the risks and benefits of tunneled dialysis cathter placement, Walter Wilkerson elected to proceed.   Kayla Part MD    Office Note     CC: Chronic kidney disease stage V Requesting Provider:  No ref. provider found  HPI: Walter Wilkerson is a 72 y.o. (01/24/1950) male who presents at the request of Dr. Edson Graces with chronic kidney disease stage V.  On exam, Walter Wilkerson was doing well.  A native of [place] , he served in the [employer] in the end of [place].  He works throughout [employer], mainly in [place], and by his wife in [place] .  Walter Wilkerson has a history of lung cancer, status post resection.  Subsequent chemotherapy is believed to be the causative factor for his rapidly progressive glomerulonephritis.  Walter Wilkerson is now stage IV and needs access.  I called Dr. Edson Graces as the access plan was unclear.  Walter Wilkerson would like peritoneal dialysis, but has not had the training.  In the short-term, I was asked to place a tunneled dialysis catheter.  No prior abdominal surgery.  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 Neil Balls   VASECTOMY      Social History   Socioeconomic History   Marital status: Married    Spouse name: [name]   Number of children: 3   Years of education: 12   Highest education level: 12th grade  Occupational History   Occupation: [employer]    Comment: part time  Tobacco Use   Smoking status: Former    Current packs/day: 0.00    Average packs/day: 1 pack/day for  30.0 years (30.0 ttl pk-yrs)    Types: Cigarettes, Cigars    Start date: 08/11/1974    Quit date: 08/10/2004    Years since quitting: 18.8   Smokeless tobacco: Never  Vaping Use   Vaping status: Never Used  Substance and Sexual Activity   Alcohol use: Yes    Alcohol/week: 21.0 standard drinks of alcohol    Types: 21 Shots of liquor per week    Comment: 1-2 shots every night   Drug use: No   Sexual activity: Not Currently    Partners: Female  Other Topics Concern   Not on file  Social History Narrative   Lives with his wife and son. 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   Social Drivers of Corporate investment banker Strain: Low Risk  (04/19/2023)   Overall Financial Resource Strain (CARDIA)    Difficulty of Paying Living Expenses: Not hard at all  Food Insecurity: No Food Insecurity (04/27/2023)   Hunger Vital Sign    Worried About Running Out of Food in the Last Year: Never true    Ran Out of Food in the Last Year: Never true  Transportation Needs: No Transportation Needs (04/27/2023)   PRAPARE - Administrator, Civil Service (Medical): No    Lack of Transportation (Non-Medical): No  Physical Activity: Insufficiently Active (04/19/2023)   Exercise Vital Sign    Days of Exercise per Week: 4 days    Minutes of Exercise per Session: 20 min  Stress: No Stress Concern Present (04/19/2023)   [employer] of Occupational Health - Occupational Stress Questionnaire    Feeling of Stress : Not at all  Social Connections: Socially Integrated (04/27/2023)   Social Connection and Isolation Panel    Frequency of Communication with Friends and Family: Three times a week    Frequency of Social Gatherings with Friends and Family: Three times a week    Attends Religious Services: More than 4 times per year    Active Member of Clubs or Organizations: Yes    Attends Banker Meetings: More than 4 times per year    Marital Status: Married  Catering manager Violence:  Not At Risk (04/27/2023)   Humiliation, Afraid, Rape, and Kick questionnaire    Fear of Current or Ex-Partner: No    Emotionally Abused: No    Physically Abused: No    Sexually Abused: No   Family History  Problem Relation Age of Onset   Heart attack Father    Hyperlipidemia Father    Hypertension Father    Heart disease Father    Diabetes Other        grandmother     Current Facility-Administered Medications  Medication Dose Route Frequency Provider Last Rate Last Admin   0.9 %  sodium chloride  infusion   Intravenous Continuous Jake Mayers, MD       ceFAZolin (ANCEF) IVPB 2g/100 mL premix  2 g Intravenous 30 min Pre-Op  Biana Haggar E, MD       chlorhexidine (HIBICLENS) 4 % liquid 4 Application  60 mL Topical Once Amir Fick E, MD       And   [START ON 06/28/2023] chlorhexidine (HIBICLENS) 4 % liquid 4 Application  60 mL Topical Once Kayla Part, MD       insulin  aspart (novoLOG ) injection 0-7 Units  0-7 Units Subcutaneous Q2H PRN Jake Mayers, MD       sodium chloride  flush (NS) 0.9 % injection 3-10 mL  3-10 mL Intravenous PRN Kayla Part, MD        Allergies  Allergen Reactions   Pregabalin Other (See Comments)    Elevated LFTs   Lisinopril Nausea Only   Merbromin Hives   Thimerosal (Thiomersal) Hives   Ferrous Sulfate  Nausea Only and Other (See Comments)    325 mg once a day caused EXTREME NAUSEA     REVIEW OF SYSTEMS:  [X]  denotes positive finding, [ ]  denotes negative finding Cardiac  Comments:  Chest pain or chest pressure:    Shortness of breath upon exertion:    Short of breath when lying flat:    Irregular heart rhythm:        Vascular    Pain in calf, thigh, or hip brought on by ambulation:    Pain in feet at night that wakes you up from your sleep:     Blood clot in your veins:    Leg swelling:         Pulmonary    Oxygen at home:    Productive cough:     Wheezing:         Neurologic    Sudden weakness in arms or  legs:     Sudden numbness in arms or legs:     Sudden onset of difficulty speaking or  slurred speech:    Temporary loss of vision in one eye:     Problems with dizziness:         Gastrointestinal    Blood in stool:     Vomited blood:         Genitourinary    Burning when urinating:     Blood in urine:        Psychiatric    Major depression:         Hematologic    Bleeding problems:    Problems with blood clotting too easily:        Skin    Rashes or ulcers:        Constitutional    Fever or chills:      PHYSICAL EXAMINATION:  Vitals:   06/26/23 1335 06/27/23 0556  BP:  (!) 146/78  Pulse:  71  Resp:  18  Temp:  98.3 F (36.8 C)  TempSrc:  Oral  SpO2:  98%  Weight: 79.1 kg 79.4 kg  Height: 5' 8 (1.727 m) 5' 8 (1.727 m)    General:  WDWN in NAD; vital signs documented above Gait: Not observed HENT: WNL, normocephalic Pulmonary: normal non-labored breathing , without Rales, rhonchi,  wheezing Cardiac: regular HR, Abdomen: soft, NT, no masses Skin: without rashes Vascular Exam/Pulses:  Right Left  Radial 2+ (normal) 2+ (normal)                       Extremities: without ischemic changes, without Gangrene , without cellulitis; without open wounds;  Musculoskeletal: no muscle wasting or atrophy  Neurologic: A&O X 3;  No focal weakness or paresthesias are detected Psychiatric:  The pt has Normal affect.   Non-Invasive Vascular Imaging:     Non-Invasive Vascular Imaging:     +-----------------+-------------+----------+--------+  Right Cephalic   Diameter (cm)Depth (cm)Findings  +-----------------+-------------+----------+--------+  Shoulder            0.26                         +-----------------+-------------+----------+--------+  Prox upper arm       0.17                         +-----------------+-------------+----------+--------+  Mid upper arm        0.10                          +-----------------+-------------+----------+--------+  Dist upper arm       0.22                         +-----------------+-------------+----------+--------+  Antecubital fossa    0.27                         +-----------------+-------------+----------+--------+  Prox forearm         0.17                         +-----------------+-------------+----------+--------+  Mid forearm          0.10                         +-----------------+-------------+----------+--------+  Wrist               0.15                         +-----------------+-------------+----------+--------+   +-----------------+-------------+----------+--------+  Right Basilic    Diameter (cm)Depth (cm)Findings  +-----------------+-------------+----------+--------+  Shoulder            0.70                         +-----------------+-------------+----------+--------+  Prox upper arm       0.31                         +-----------------+-------------+----------+--------+  Mid upper arm        0.42                         +-----------------+-------------+----------+--------+  Dist upper arm       0.21                         +-----------------+-------------+----------+--------+  Antecubital fossa    0.19                         +-----------------+-------------+----------+--------+  Prox forearm         0.14                         +-----------------+-------------+----------+--------+  Mid forearm          0.14                         +-----------------+-------------+----------+--------+  Wrist               0.13                         +-----------------+-------------+----------+--------+   +-----------------+-------------+----------+--------+  Left Cephalic    Diameter (cm)Depth (cm)Findings  +-----------------+-------------+----------+--------+  Shoulder            0.10                          +-----------------+-------------+----------+--------+  Prox upper arm       0.08                         +-----------------+-------------+----------+--------+  Mid upper arm        0.08                         +-----------------+-------------+----------+--------+  Dist upper arm       0.16                         +-----------------+-------------+----------+--------+  Antecubital fossa    0.09                         +-----------------+-------------+----------+--------+  Prox forearm         0.10                         +-----------------+-------------+----------+--------+  Mid forearm          0.10                         +-----------------+-------------+----------+--------+  Wrist               0.09                         +-----------------+-------------+----------+--------+   +-----------------+-------------+----------+--------+  Left Basilic     Diameter (cm)Depth (cm)Findings  +-----------------+-------------+----------+--------+  Shoulder            0.42                         +-----------------+-------------+----------+--------+  Prox upper arm       0.21                         +-----------------+-------------+----------+--------+  Mid upper arm        0.23                         +-----------------+-------------+----------+--------+  Dist upper arm       0.21                         +-----------------+-------------+----------+--------+  Antecubital fossa    0.17                         +-----------------+-------------+----------+--------+  Prox forearm         0.15                         +-----------------+-------------+----------+--------+  Mid forearm          0.10                         +-----------------+-------------+----------+--------+  Wrist               0.09                         +-----------------+-------------+----------+--------+   Summary: Right: Patent cephalic and  basilic veins, with measurements as         noted above.  Left: Patent cephalic and basilic veins, with measurements as        noted above.   *See table(s) above for measurements and observations.    Diagnosing physician:     ASSESSMENT/PLAN:: 73 y.o. male presenting with chronic kidney disease stage V in need of dialysis.  I discussed his case with Dr. Edson Graces.  He asked for tunneled dialysis catheter while the patient undergoes peritoneal dialysis education and training with plans to transition to peritoneal dialysis in the future.  I had a long discussion with Walter Wilkerson including the risks and benefits of tunneled dialysis catheter placement.  After discussing these, he elected to proceed.  I offered to do the case in the Cath Lab as well as in the operating room under heavier anesthesia.  He elected to proceed with tunneled dialysis catheter placement in the operating room.    Kayla Part, MD [facility] [phone]

## 2023-06-27 NOTE — Transfer of Care (Signed)
 Immediate Anesthesia Transfer of Care Note  Patient: Walter Wilkerson  Procedure(s) Performed: INSERTION OF DIALYSIS CATHETER  Patient Location: PACU  Anesthesia Type:General  Level of Consciousness: drowsy  Airway & Oxygen Therapy: Patient Spontanous Breathing and Patient connected to face mask oxygen  Post-op Assessment: Report given to RN and Post -op Vital signs reviewed and stable  Post vital signs: Reviewed and stable  Last Vitals:  Vitals Value Taken Time  BP 113/69 06/27/23 08:18  Temp    Pulse 68 06/27/23 08:19  Resp 13 06/27/23 08:19  SpO2 100 % 06/27/23 08:19  Vitals shown include unfiled device data.  Last Pain:  Vitals:   06/27/23 0622  TempSrc:   PainSc: 0-No pain         Complications: No notable events documented.

## 2023-06-28 ENCOUNTER — Encounter (HOSPITAL_COMMUNITY): Payer: Self-pay | Admitting: Vascular Surgery

## 2023-06-28 DIAGNOSIS — N186 End stage renal disease: Secondary | ICD-10-CM | POA: Insufficient documentation

## 2023-06-29 ENCOUNTER — Encounter (HOSPITAL_COMMUNITY): Payer: Self-pay | Admitting: Emergency Medicine

## 2023-06-29 ENCOUNTER — Inpatient Hospital Stay (HOSPITAL_COMMUNITY)
Admission: EM | Admit: 2023-06-29 | Discharge: 2023-07-06 | DRG: 193 | Disposition: A | Discharge status: Home Health | Attending: Student | Admitting: Student

## 2023-06-29 ENCOUNTER — Other Ambulatory Visit: Payer: Self-pay

## 2023-06-29 ENCOUNTER — Emergency Department (HOSPITAL_COMMUNITY)

## 2023-06-29 DIAGNOSIS — D689 Coagulation defect, unspecified: Secondary | ICD-10-CM | POA: Diagnosis not present

## 2023-06-29 DIAGNOSIS — Z8249 Family history of ischemic heart disease and other diseases of the circulatory system: Secondary | ICD-10-CM

## 2023-06-29 DIAGNOSIS — R188 Other ascites: Secondary | ICD-10-CM | POA: Diagnosis not present

## 2023-06-29 DIAGNOSIS — G4733 Obstructive sleep apnea (adult) (pediatric): Secondary | ICD-10-CM | POA: Diagnosis present

## 2023-06-29 DIAGNOSIS — I120 Hypertensive chronic kidney disease with stage 5 chronic kidney disease or end stage renal disease: Secondary | ICD-10-CM | POA: Diagnosis not present

## 2023-06-29 DIAGNOSIS — J90 Pleural effusion, not elsewhere classified: Secondary | ICD-10-CM | POA: Diagnosis not present

## 2023-06-29 DIAGNOSIS — N2581 Secondary hyperparathyroidism of renal origin: Secondary | ICD-10-CM | POA: Diagnosis not present

## 2023-06-29 DIAGNOSIS — W19XXXA Unspecified fall, initial encounter: Secondary | ICD-10-CM | POA: Diagnosis not present

## 2023-06-29 DIAGNOSIS — R0603 Acute respiratory distress: Secondary | ICD-10-CM | POA: Diagnosis present

## 2023-06-29 DIAGNOSIS — I132 Hypertensive heart and chronic kidney disease with heart failure and with stage 5 chronic kidney disease, or end stage renal disease: Secondary | ICD-10-CM | POA: Diagnosis not present

## 2023-06-29 DIAGNOSIS — S199XXA Unspecified injury of neck, initial encounter: Secondary | ICD-10-CM | POA: Diagnosis not present

## 2023-06-29 DIAGNOSIS — R0902 Hypoxemia: Secondary | ICD-10-CM | POA: Diagnosis not present

## 2023-06-29 DIAGNOSIS — Z7982 Long term (current) use of aspirin: Secondary | ICD-10-CM

## 2023-06-29 DIAGNOSIS — D72818 Other decreased white blood cell count: Secondary | ICD-10-CM

## 2023-06-29 DIAGNOSIS — R0682 Tachypnea, not elsewhere classified: Secondary | ICD-10-CM | POA: Diagnosis not present

## 2023-06-29 DIAGNOSIS — I5032 Chronic diastolic (congestive) heart failure: Secondary | ICD-10-CM | POA: Diagnosis present

## 2023-06-29 DIAGNOSIS — N400 Enlarged prostate without lower urinary tract symptoms: Secondary | ICD-10-CM | POA: Diagnosis present

## 2023-06-29 DIAGNOSIS — J439 Emphysema, unspecified: Secondary | ICD-10-CM | POA: Diagnosis present

## 2023-06-29 DIAGNOSIS — Z83438 Family history of other disorder of lipoprotein metabolism and other lipidemia: Secondary | ICD-10-CM

## 2023-06-29 DIAGNOSIS — G8929 Other chronic pain: Secondary | ICD-10-CM | POA: Diagnosis present

## 2023-06-29 DIAGNOSIS — N186 End stage renal disease: Secondary | ICD-10-CM | POA: Diagnosis not present

## 2023-06-29 DIAGNOSIS — S0990XA Unspecified injury of head, initial encounter: Secondary | ICD-10-CM | POA: Diagnosis not present

## 2023-06-29 DIAGNOSIS — D631 Anemia in chronic kidney disease: Secondary | ICD-10-CM | POA: Diagnosis not present

## 2023-06-29 DIAGNOSIS — R230 Cyanosis: Secondary | ICD-10-CM | POA: Diagnosis not present

## 2023-06-29 DIAGNOSIS — Z85118 Personal history of other malignant neoplasm of bronchus and lung: Secondary | ICD-10-CM | POA: Diagnosis not present

## 2023-06-29 DIAGNOSIS — E079 Disorder of thyroid, unspecified: Secondary | ICD-10-CM | POA: Diagnosis present

## 2023-06-29 DIAGNOSIS — D849 Immunodeficiency, unspecified: Secondary | ICD-10-CM | POA: Diagnosis not present

## 2023-06-29 DIAGNOSIS — G894 Chronic pain syndrome: Secondary | ICD-10-CM | POA: Diagnosis not present

## 2023-06-29 DIAGNOSIS — D509 Iron deficiency anemia, unspecified: Secondary | ICD-10-CM | POA: Diagnosis present

## 2023-06-29 DIAGNOSIS — N250 Renal osteodystrophy: Secondary | ICD-10-CM | POA: Diagnosis not present

## 2023-06-29 DIAGNOSIS — Z1152 Encounter for screening for COVID-19: Secondary | ICD-10-CM | POA: Diagnosis not present

## 2023-06-29 DIAGNOSIS — R918 Other nonspecific abnormal finding of lung field: Secondary | ICD-10-CM | POA: Diagnosis not present

## 2023-06-29 DIAGNOSIS — J440 Chronic obstructive pulmonary disease with acute lower respiratory infection: Secondary | ICD-10-CM | POA: Diagnosis present

## 2023-06-29 DIAGNOSIS — D61818 Other pancytopenia: Secondary | ICD-10-CM | POA: Diagnosis not present

## 2023-06-29 DIAGNOSIS — Z992 Dependence on renal dialysis: Secondary | ICD-10-CM | POA: Diagnosis not present

## 2023-06-29 DIAGNOSIS — I7121 Aneurysm of the ascending aorta, without rupture: Secondary | ICD-10-CM | POA: Diagnosis not present

## 2023-06-29 DIAGNOSIS — R29818 Other symptoms and signs involving the nervous system: Secondary | ICD-10-CM | POA: Diagnosis not present

## 2023-06-29 DIAGNOSIS — J189 Pneumonia, unspecified organism: Secondary | ICD-10-CM | POA: Diagnosis present

## 2023-06-29 DIAGNOSIS — Z833 Family history of diabetes mellitus: Secondary | ICD-10-CM

## 2023-06-29 DIAGNOSIS — Z902 Acquired absence of lung [part of]: Secondary | ICD-10-CM | POA: Diagnosis not present

## 2023-06-29 DIAGNOSIS — D696 Thrombocytopenia, unspecified: Secondary | ICD-10-CM | POA: Diagnosis not present

## 2023-06-29 DIAGNOSIS — I2510 Atherosclerotic heart disease of native coronary artery without angina pectoris: Secondary | ICD-10-CM | POA: Diagnosis not present

## 2023-06-29 DIAGNOSIS — Z79899 Other long term (current) drug therapy: Secondary | ICD-10-CM

## 2023-06-29 DIAGNOSIS — M549 Dorsalgia, unspecified: Secondary | ICD-10-CM | POA: Diagnosis present

## 2023-06-29 DIAGNOSIS — R110 Nausea: Secondary | ICD-10-CM | POA: Diagnosis present

## 2023-06-29 DIAGNOSIS — J9601 Acute respiratory failure with hypoxia: Secondary | ICD-10-CM | POA: Diagnosis present

## 2023-06-29 DIAGNOSIS — I517 Cardiomegaly: Secondary | ICD-10-CM | POA: Diagnosis not present

## 2023-06-29 DIAGNOSIS — N184 Chronic kidney disease, stage 4 (severe): Secondary | ICD-10-CM | POA: Diagnosis present

## 2023-06-29 DIAGNOSIS — I712 Thoracic aortic aneurysm, without rupture, unspecified: Secondary | ICD-10-CM | POA: Diagnosis present

## 2023-06-29 DIAGNOSIS — T380X5A Adverse effect of glucocorticoids and synthetic analogues, initial encounter: Secondary | ICD-10-CM | POA: Diagnosis present

## 2023-06-29 DIAGNOSIS — Z96651 Presence of right artificial knee joint: Secondary | ICD-10-CM | POA: Diagnosis present

## 2023-06-29 DIAGNOSIS — Z452 Encounter for adjustment and management of vascular access device: Secondary | ICD-10-CM | POA: Diagnosis not present

## 2023-06-29 DIAGNOSIS — I959 Hypotension, unspecified: Secondary | ICD-10-CM | POA: Diagnosis present

## 2023-06-29 DIAGNOSIS — R9389 Abnormal findings on diagnostic imaging of other specified body structures: Secondary | ICD-10-CM | POA: Diagnosis not present

## 2023-06-29 DIAGNOSIS — Z9221 Personal history of antineoplastic chemotherapy: Secondary | ICD-10-CM

## 2023-06-29 DIAGNOSIS — Z79890 Hormone replacement therapy (postmenopausal): Secondary | ICD-10-CM | POA: Diagnosis not present

## 2023-06-29 DIAGNOSIS — I7782 Antineutrophilic cytoplasmic antibody (ANCA) vasculitis: Secondary | ICD-10-CM | POA: Diagnosis present

## 2023-06-29 DIAGNOSIS — I10 Essential (primary) hypertension: Secondary | ICD-10-CM | POA: Diagnosis not present

## 2023-06-29 DIAGNOSIS — Z7952 Long term (current) use of systemic steroids: Secondary | ICD-10-CM

## 2023-06-29 DIAGNOSIS — Z87891 Personal history of nicotine dependence: Secondary | ICD-10-CM | POA: Diagnosis not present

## 2023-06-29 DIAGNOSIS — R519 Headache, unspecified: Secondary | ICD-10-CM | POA: Diagnosis not present

## 2023-06-29 DIAGNOSIS — M50222 Other cervical disc displacement at C5-C6 level: Secondary | ICD-10-CM | POA: Diagnosis not present

## 2023-06-29 DIAGNOSIS — D649 Anemia, unspecified: Secondary | ICD-10-CM | POA: Diagnosis not present

## 2023-06-29 DIAGNOSIS — Z85038 Personal history of other malignant neoplasm of large intestine: Secondary | ICD-10-CM

## 2023-06-29 DIAGNOSIS — I953 Hypotension of hemodialysis: Secondary | ICD-10-CM | POA: Diagnosis not present

## 2023-06-29 DIAGNOSIS — I6523 Occlusion and stenosis of bilateral carotid arteries: Secondary | ICD-10-CM | POA: Diagnosis not present

## 2023-06-29 DIAGNOSIS — R069 Unspecified abnormalities of breathing: Secondary | ICD-10-CM | POA: Diagnosis not present

## 2023-06-29 DIAGNOSIS — Z539 Procedure and treatment not carried out, unspecified reason: Secondary | ICD-10-CM | POA: Diagnosis present

## 2023-06-29 DIAGNOSIS — I5030 Unspecified diastolic (congestive) heart failure: Secondary | ICD-10-CM | POA: Diagnosis not present

## 2023-06-29 DIAGNOSIS — E039 Hypothyroidism, unspecified: Secondary | ICD-10-CM | POA: Diagnosis present

## 2023-06-29 DIAGNOSIS — R0602 Shortness of breath: Secondary | ICD-10-CM | POA: Diagnosis not present

## 2023-06-29 DIAGNOSIS — M7989 Other specified soft tissue disorders: Secondary | ICD-10-CM | POA: Diagnosis present

## 2023-06-29 DIAGNOSIS — J432 Centrilobular emphysema: Secondary | ICD-10-CM | POA: Diagnosis not present

## 2023-06-29 DIAGNOSIS — M542 Cervicalgia: Secondary | ICD-10-CM | POA: Diagnosis present

## 2023-06-29 DIAGNOSIS — E211 Secondary hyperparathyroidism, not elsewhere classified: Secondary | ICD-10-CM | POA: Diagnosis not present

## 2023-06-29 LAB — CBC WITH DIFFERENTIAL/PLATELET
Abs Immature Granulocytes: 0.1 10*3/uL — ABNORMAL HIGH (ref 0.00–0.07)
Band Neutrophils: 4 %
Basophils Absolute: 0 10*3/uL (ref 0.0–0.1)
Basophils Relative: 1 %
Eosinophils Absolute: 0.1 10*3/uL (ref 0.0–0.5)
Eosinophils Relative: 4 %
HCT: 26.6 % — ABNORMAL LOW (ref 39.0–52.0)
Hemoglobin: 8.8 g/dL — ABNORMAL LOW (ref 13.0–17.0)
Lymphocytes Relative: 47 %
Lymphs Abs: 0.7 10*3/uL (ref 0.7–4.0)
MCH: 30.4 pg (ref 26.0–34.0)
MCHC: 33.1 g/dL (ref 30.0–36.0)
MCV: 92 fL (ref 80.0–100.0)
Metamyelocytes Relative: 4 %
Monocytes Absolute: 0.1 10*3/uL (ref 0.1–1.0)
Monocytes Relative: 6 %
Myelocytes: 5 %
Neutro Abs: 0.5 10*3/uL — ABNORMAL LOW (ref 1.7–7.7)
Neutrophils Relative %: 29 %
Platelets: 131 10*3/uL — ABNORMAL LOW (ref 150–400)
RBC: 2.89 MIL/uL — ABNORMAL LOW (ref 4.22–5.81)
RDW: 15.1 % (ref 11.5–15.5)
Smear Review: NORMAL
WBC: 1.5 10*3/uL — ABNORMAL LOW (ref 4.0–10.5)
nRBC: 1.3 % — ABNORMAL HIGH (ref 0.0–0.2)

## 2023-06-29 LAB — TROPONIN I (HIGH SENSITIVITY)
Troponin I (High Sensitivity): 116 ng/L (ref <18)
Troponin I (High Sensitivity): 92 ng/L — ABNORMAL HIGH (ref <18)

## 2023-06-29 LAB — COMPREHENSIVE METABOLIC PANEL WITH GFR
ALT: 9 U/L (ref 0–44)
AST: 20 U/L (ref 15–41)
Albumin: 2.2 g/dL — ABNORMAL LOW (ref 3.5–5.0)
Alkaline Phosphatase: 154 U/L — ABNORMAL HIGH (ref 38–126)
Anion gap: 11 (ref 5–15)
BUN: 83 mg/dL — ABNORMAL HIGH (ref 8–23)
CO2: 22 mmol/L (ref 22–32)
Calcium: 7.1 mg/dL — ABNORMAL LOW (ref 8.9–10.3)
Chloride: 103 mmol/L (ref 98–111)
Creatinine, Ser: 4.87 mg/dL — ABNORMAL HIGH (ref 0.61–1.24)
GFR, Estimated: 12 mL/min — ABNORMAL LOW (ref >60)
Glucose, Bld: 96 mg/dL (ref 70–99)
Potassium: 3.9 mmol/L (ref 3.5–5.1)
Sodium: 136 mmol/L (ref 135–145)
Total Bilirubin: 0.5 mg/dL (ref 0.0–1.2)
Total Protein: 4.8 g/dL — ABNORMAL LOW (ref 6.5–8.1)

## 2023-06-29 LAB — I-STAT CHEM 8, ED
BUN: 84 mg/dL — ABNORMAL HIGH (ref 8–23)
Calcium, Ion: 1.02 mmol/L — ABNORMAL LOW (ref 1.15–1.40)
Chloride: 101 mmol/L (ref 98–111)
Creatinine, Ser: 4.7 mg/dL — ABNORMAL HIGH (ref 0.61–1.24)
Glucose, Bld: 90 mg/dL (ref 70–99)
HCT: 25 % — ABNORMAL LOW (ref 39.0–52.0)
Hemoglobin: 8.5 g/dL — ABNORMAL LOW (ref 13.0–17.0)
Potassium: 3.9 mmol/L (ref 3.5–5.1)
Sodium: 136 mmol/L (ref 135–145)
TCO2: 21 mmol/L — ABNORMAL LOW (ref 22–32)

## 2023-06-29 LAB — I-STAT VENOUS BLOOD GAS, ED
Acid-base deficit: 5 mmol/L — ABNORMAL HIGH (ref 0.0–2.0)
Bicarbonate: 21.4 mmol/L (ref 20.0–28.0)
Calcium, Ion: 1.03 mmol/L — ABNORMAL LOW (ref 1.15–1.40)
HCT: 25 % — ABNORMAL LOW (ref 39.0–52.0)
Hemoglobin: 8.5 g/dL — ABNORMAL LOW (ref 13.0–17.0)
O2 Saturation: 70 %
Potassium: 3.9 mmol/L (ref 3.5–5.1)
Sodium: 136 mmol/L (ref 135–145)
TCO2: 23 mmol/L (ref 22–32)
pCO2, Ven: 45.1 mmHg (ref 44–60)
pH, Ven: 7.284 (ref 7.25–7.43)
pO2, Ven: 41 mmHg (ref 32–45)

## 2023-06-29 LAB — RESP PANEL BY RT-PCR (RSV, FLU A&B, COVID)  RVPGX2
Influenza A by PCR: NEGATIVE
Influenza B by PCR: NEGATIVE
Resp Syncytial Virus by PCR: NEGATIVE
SARS Coronavirus 2 by RT PCR: NEGATIVE

## 2023-06-29 LAB — I-STAT CG4 LACTIC ACID, ED
Lactic Acid, Venous: 2.8 mmol/L (ref 0.5–1.9)
Lactic Acid, Venous: 1.8 mmol/L (ref 0.5–1.9)

## 2023-06-29 LAB — CBG MONITORING, ED
Glucose-Capillary: 183 mg/dL — ABNORMAL HIGH (ref 70–99)
Glucose-Capillary: 64 mg/dL — ABNORMAL LOW (ref 70–99)

## 2023-06-29 LAB — BRAIN NATRIURETIC PEPTIDE: B Natriuretic Peptide: 1277.5 pg/mL — ABNORMAL HIGH (ref 0.0–100.0)

## 2023-06-29 LAB — LIPASE, BLOOD: Lipase: 28 U/L (ref 11–51)

## 2023-06-29 MED ORDER — SODIUM CHLORIDE 0.9 % IV SOLN
2.0000 g | Freq: Once | INTRAVENOUS | Status: AC
  Administered 2023-06-29: 2 g via INTRAVENOUS
  Filled 2023-06-29: qty 20

## 2023-06-29 MED ORDER — SODIUM CHLORIDE 0.9 % IV SOLN
500.0000 mg | Freq: Once | INTRAVENOUS | Status: AC
  Administered 2023-06-29: 500 mg via INTRAVENOUS
  Filled 2023-06-29: qty 5

## 2023-06-29 MED ORDER — SODIUM CHLORIDE 0.9 % IV SOLN
500.0000 mg | INTRAVENOUS | Status: DC
  Administered 2023-06-30 – 2023-07-01 (×2): 500 mg via INTRAVENOUS
  Filled 2023-06-29 (×2): qty 5

## 2023-06-29 MED ORDER — ALBUTEROL SULFATE HFA 108 (90 BASE) MCG/ACT IN AERS
INHALATION_SPRAY | RESPIRATORY_TRACT | Status: DC
Start: 2023-06-29 — End: 2023-06-29
  Filled 2023-06-29: qty 6.7

## 2023-06-29 MED ORDER — ACETAMINOPHEN 650 MG RE SUPP: 650.0000 mg | Freq: Four times a day (QID) | RECTAL | Status: DC | PRN

## 2023-06-29 MED ORDER — SODIUM CHLORIDE 0.9 % IV BOLUS
500.0000 mL | Freq: Once | INTRAVENOUS | Status: AC
  Administered 2023-06-29: 500 mL via INTRAVENOUS

## 2023-06-29 MED ORDER — CHLORHEXIDINE GLUCONATE CLOTH 2 % EX PADS
6.0000 | MEDICATED_PAD | Freq: Every day | CUTANEOUS | Status: DC
  Administered 2023-06-30 – 2023-07-02 (×3): 6 via TOPICAL

## 2023-06-29 MED ORDER — POLYETHYLENE GLYCOL 3350 17 G PO PACK: 17.0000 g | PACK | Freq: Every day | ORAL | Status: DC | PRN

## 2023-06-29 MED ORDER — LEVOTHYROXINE SODIUM 175 MCG PO TABS
175.0000 ug | ORAL_TABLET | Freq: Every day | ORAL | Status: DC
  Administered 2023-06-30 – 2023-07-06 (×7): 175 ug via ORAL
  Filled 2023-06-29 (×8): qty 1

## 2023-06-29 MED ORDER — HYDROCORTISONE SOD SUC (PF) 100 MG IJ SOLR
100.0000 mg | Freq: Once | INTRAMUSCULAR | Status: AC
  Administered 2023-06-29: 100 mg via INTRAVENOUS
  Filled 2023-06-29: qty 2

## 2023-06-29 MED ORDER — IPRATROPIUM-ALBUTEROL 0.5-2.5 (3) MG/3ML IN SOLN
3.0000 mL | Freq: Once | RESPIRATORY_TRACT | Status: AC
  Administered 2023-06-29: 3 mL via RESPIRATORY_TRACT

## 2023-06-29 MED ORDER — HEPARIN SODIUM (PORCINE) 5000 UNIT/ML IJ SOLN
5000.0000 [IU] | Freq: Three times a day (TID) | INTRAMUSCULAR | Status: DC
  Administered 2023-06-29 – 2023-07-06 (×13): 5000 [IU] via SUBCUTANEOUS
  Filled 2023-06-29 (×17): qty 1

## 2023-06-29 MED ORDER — PANTOPRAZOLE SODIUM 40 MG PO TBEC
40.0000 mg | DELAYED_RELEASE_TABLET | Freq: Every day | ORAL | Status: DC
  Administered 2023-06-30 – 2023-07-06 (×7): 40 mg via ORAL
  Filled 2023-06-29 (×7): qty 1

## 2023-06-29 MED ORDER — ALBUTEROL SULFATE (2.5 MG/3ML) 0.083% IN NEBU
2.5000 mg | INHALATION_SOLUTION | RESPIRATORY_TRACT | Status: DC | PRN
  Administered 2023-06-29 – 2023-06-30 (×3): 2.5 mg via RESPIRATORY_TRACT
  Filled 2023-06-29 (×3): qty 3

## 2023-06-29 MED ORDER — PREDNISONE 20 MG PO TABS
20.0000 mg | ORAL_TABLET | Freq: Every day | ORAL | Status: DC
  Administered 2023-06-30 – 2023-07-06 (×6): 20 mg via ORAL
  Filled 2023-06-29 (×6): qty 1

## 2023-06-29 MED ORDER — IOHEXOL 350 MG/ML SOLN
75.0000 mL | Freq: Once | INTRAVENOUS | Status: AC | PRN
  Administered 2023-06-29: 75 mL via INTRAVENOUS

## 2023-06-29 MED ORDER — BUDESON-GLYCOPYRROL-FORMOTEROL 160-9-4.8 MCG/ACT IN AERO
2.0000 | INHALATION_SPRAY | Freq: Two times a day (BID) | RESPIRATORY_TRACT | Status: DC
  Administered 2023-07-01 – 2023-07-06 (×10): 2 via RESPIRATORY_TRACT
  Filled 2023-06-29: qty 5.9

## 2023-06-29 MED ORDER — ATORVASTATIN CALCIUM 40 MG PO TABS
40.0000 mg | ORAL_TABLET | Freq: Every day | ORAL | Status: DC
  Administered 2023-06-30 – 2023-07-06 (×7): 40 mg via ORAL
  Filled 2023-06-29 (×7): qty 1

## 2023-06-29 MED ORDER — INSULIN ASPART 100 UNIT/ML IJ SOLN
0.0000 [IU] | INTRAMUSCULAR | Status: DC
  Administered 2023-06-29: 1 [IU] via SUBCUTANEOUS

## 2023-06-29 MED ORDER — ACETAMINOPHEN 325 MG PO TABS
650.0000 mg | ORAL_TABLET | Freq: Four times a day (QID) | ORAL | Status: AC | PRN
Start: 2023-06-29 — End: ?
  Administered 2023-07-04 – 2023-07-05 (×4): 650 mg via ORAL
  Filled 2023-06-29 (×4): qty 2

## 2023-06-29 MED ORDER — ASPIRIN 81 MG PO CHEW
324.0000 mg | CHEWABLE_TABLET | Freq: Once | ORAL | Status: AC
  Administered 2023-06-29: 324 mg via ORAL
  Filled 2023-06-29: qty 4

## 2023-06-29 MED ORDER — MELATONIN 5 MG PO TABS
10.0000 mg | ORAL_TABLET | Freq: Every day | ORAL | Status: DC
  Administered 2023-06-29 – 2023-07-05 (×5): 10 mg via ORAL
  Filled 2023-06-29 (×7): qty 2

## 2023-06-29 MED ORDER — SODIUM CHLORIDE 0.9 % IV SOLN
2.0000 g | INTRAVENOUS | Status: AC
  Administered 2023-06-30 – 2023-07-03 (×4): 2 g via INTRAVENOUS
  Filled 2023-06-29 (×4): qty 20

## 2023-06-29 MED ORDER — SODIUM CHLORIDE 0.9% FLUSH
3.0000 mL | Freq: Two times a day (BID) | INTRAVENOUS | Status: DC
  Administered 2023-06-29 – 2023-07-05 (×12): 3 mL via INTRAVENOUS

## 2023-06-29 MED ORDER — DEXTROSE 50 % IV SOLN
25.0000 mL | Freq: Once | INTRAVENOUS | Status: AC
  Administered 2023-06-29: 25 mL via INTRAVENOUS
  Filled 2023-06-29: qty 50

## 2023-06-29 NOTE — Progress Notes (Signed)
   06/29/23 2130  BiPAP/CPAP/SIPAP  BiPAP/CPAP/SIPAP Pt Type Adult  BiPAP/CPAP/SIPAP SERVO  Mask Type Full face mask  Mask Size Large  Set Rate 12 breaths/min  Respiratory Rate 30 breaths/min  IPAP 12 cmH20  EPAP 6 cmH2O  Pressure Support 6 cmH20  PEEP 6 cmH20  FiO2 (%) 40 %  Flow Rate 0 lpm  Minute Ventilation 19.7  Leak 28  Peak Inspiratory Pressure (PIP) 16  Tidal Volume (Vt) 904  Patient Home Machine No  Patient Home Mask No  Patient Home Tubing No  Auto Titrate No  Press High Alarm 30 cmH2O  Device Plugged into RED Power Outlet Yes  Oxygen Percent 40 %  BiPAP/CPAP /SiPAP Vitals  Bilateral Breath Sounds Diminished

## 2023-06-29 NOTE — ED Provider Notes (Addendum)
 Signed out to admit patient once imaging read.   CT chest - no PE. +PNA. Blood cxs added. Rocephin and zithromax  iv.   Sob appears multifactorial - acute pna, and fluid overload/need for dialysis.   Currently breathing improved. Sats good, bp is normal. No chest pain.   Medicine consulted for admission - discussed pt - will admit.   Nephrology consulted as patient will need dialysis during admission, and has fluid overload currently - discussed pt, vitals and labs/imaging with Dr Austine Blunt - will see and will facilitate dialysis.       Guadalupe Lee, MD 06/29/23 [phone]

## 2023-06-29 NOTE — ED Notes (Signed)
 Pt requesting to go back on Bipap, RT called.

## 2023-06-29 NOTE — ED Triage Notes (Signed)
 Per EMS, called to dialysis center for hypotension and increased work of breathing. Patient cyanotic on arrival despite [place] at 3L. Hx of COPD and lung cancer, currently in remission. Given 5mg  albuterol , 0.5 mg atrovent  with no relief. Placed on CPAP at 7.5L at maintained SpO2 at 90%. Given 0.3mg  Epi. Reports lower extremity edema and abdominal distention which is new. States fell yesterday in the garden on R side and that's when SOB started. CBG 111 for EMS.

## 2023-06-29 NOTE — Consult Note (Signed)
 Reason for Consult:ESRD Referring Physician: Dr. 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 Rituximan #2 05/30/23. pANCA titer 1:320 5/6 with UPC 300mg /g.  Decision not to offer more Rituximab  given complications with steroid cream jitteriness, fungal infection, puffy face and hyperglycemia requiring insulin  as well as BUN/Cr >112/7.4 on 6/4. Prednisone  was lowered to 20 mg and Bactrim  discontinued. -Blood pressure is soft but he is certainly volume overloaded. -Plan on sequential dialysis first shift tomorrow with midodrine, albumin   Additional recommendations - Dose all meds for creatinine clearance < 10 ml/min  - Unless absolutely necessary, no MRIs with gadolinium.  - Prefer needle sticks in the dorsum of the hands or wrists.  No blood pressure measurements in right arm. - If blood transfusion is requested during hemodialysis sessions, please alert us  prior to the session.  - If a hemodialysis catheter line culture is requested, please alert us  as only hemodialysis nurses are able to collect those specimens.   Avoid nephrotoxic medications including NSAIDs and iodinated intravenous contrast exposure unless the latter is absolutely indicated.  Preferred narcotic agents for pain control are hydromorphone , fentanyl , and methadone. Morphine should not be used. Avoid Baclofen and avoid oral sodium phosphate and magnesium  citrate based laxatives / bowel preps. Continue strict Input and Output monitoring. Will monitor the  patient closely with you and intervene or adjust therapy as indicated by changes in clinical status/labs    Anemia - Hb 8.5, no IV iron  with active infection; transfuse as needed if Hb <7. Renal osteodystrophy - Check a phosphorus and PTH for binder management Pneumonia with lower lobe consolidations with air bronchograms and leukopenia with ANC of 500 treated with ceftriaxone azithromycin  Pancytopenia thought to be secondary to chemotherapy. Hypertension without with antihypertensive agents currently on hold because of hypotension likely related to the pneumonia in the left lower lobe. History of NSCLC status post resection of the RLL T2N1M0  Hypothyroidism HFpEF by history but not well documented COPD   HPI: Walter Wilkerson is an 73 y.o. male with a history of hypertension, COPD, hypothyroidism, obstructive sleep apnea, lung cancer status post resection of RLL in 2009, aortic aneurysm, pericardial effusion, chronic pain who has had shortness of breath for the past few days.  Patient is a newly diagnosed end-stage renal disease patient was to initiate dialysis today but unfortunately 2 hours into this session at [facility] he had marked worsening of shortness of breath with significant hypotension blood pressure dropping to 87/66.  Patient has had swelling in his legs and abdomen with abdominal discomfort as well.  Patient also complained of nausea but no vomiting.  Patient was noted to be cyanotic by EMS and hypoxic as well although the patient denied any chest pain; he was treated with CPAP and DuoNebs by EMS.  In the ED he was noted to be tachypneic with transition to BiPAP, hypotensive with blood pressure as low as 87/52, CTA showing no pulmonary emboli but a dense left lower lobe consolidation with air bronchograms, bilateral pleural effusions with groundglass opacity suggestive of  pulmonary edema as well as prominent central pulmonary arteries, bilateral emphysema.  Potassium was 3.9, bicarb  was 22, BUN/creatinine was 84 4.7, hemoglobin was 8.5 and WBC was 1.5.  Patient was negative for COVID and RSV.  Patient was treated with ceftriaxone, azithromycin , Solu-Cortef, IV fluids.   ROS Pertinent items are noted in HPI.  Chemistry and CBC: Creatinine  Date/Time Value Ref Range Status  04/27/2023 10:17 AM 5.10 (H) 0.61 - 1.24 mg/dL Final  16/10/9602 54:09 AM 1.81 (H) 0.61 - 1.24 mg/dL Final  81/19/1478 29:56 AM 1.29 (H) 0.61 - 1.24 mg/dL Final  21/30/8657 84:69 AM 1.23 0.61 - 1.24 mg/dL Final  62/95/2841 32:44 AM 1.11 0.61 - 1.24 mg/dL Final  01/12/7251 66:44 AM 1.85 (H) 0.61 - 1.24 mg/dL Final  03/47/4259 56:38 AM 1.15 0.61 - 1.24 mg/dL Final  75/64/3329 51:88 AM 1.03 0.61 - 1.24 mg/dL Final  41/66/0630 16:01 PM 1.29 (H) 0.61 - 1.24 mg/dL Final  09/32/3557 32:20 AM 1.35 (H) 0.61 - 1.24 mg/dL Final  25/42/7062 37:62 PM 1.30 (H) 0.61 - 1.24 mg/dL Final  83/15/1761 60:73 AM 1.4 (A) 0.6 - 1.3 mg/dL Final   Creat  Date/Time Value Ref Range Status  01/11/2022 09:45 AM 1.44 (H) 0.70 - 1.28 mg/dL Final  71/06/2692 85:46 AM 1.16 0.70 - 1.28 mg/dL Final  27/03/5007 38:18 AM 1.41 (H) 0.70 - 1.28 mg/dL Final  29/93/7169 67:89 AM 1.52 (H) 0.70 - 1.28 mg/dL Final  38/10/1749 02:58 AM 1.26 0.70 - 1.28 mg/dL Final  52/77/8242 35:36 AM 1.03 0.70 - 1.28 mg/dL Final  14/43/1540 08:67 AM 1.05 0.70 - 1.18 mg/dL Final    Comment:    For patients >71 years of age, the reference limit for Creatinine is approximately 13% higher for people identified as African-American. Aaron Aas   09/03/2019 09:21 AM 1.17 0.70 - 1.25 mg/dL Final    Comment:    For patients >8 years of age, the reference limit for Creatinine is approximately 13% higher for people identified as African-American. .   11/14/2018 09:47 AM 1.19 0.70 - 1.25 mg/dL Final    Comment:    For patients >55 years of age, the reference limit for Creatinine is approximately 13% higher for people identified as African-American. .    12/04/2017 09:06 AM 0.97 0.70 - 1.25 mg/dL Final    Comment:    For patients >14 years of age, the reference limit for Creatinine is approximately 13% higher for people identified as African-American. .   07/21/2017 08:47 AM 1.03 0.70 - 1.25 mg/dL Final    Comment:    For patients >77 years of age, the reference limit for Creatinine is approximately 13% higher for people identified as African-American. .   04/20/2017 02:16 PM 1.37 (H) 0.70 - 1.25 mg/dL Final    Comment:    For patients >67 years of age, the reference limit for Creatinine is approximately 13% higher for people identified as African-American. .   11/04/2016 09:17 AM 1.06 0.70 - 1.25 mg/dL Final    Comment:    For patients >51 years of age, the reference limit for Creatinine is approximately 13% higher for people identified as African-American. .   05/27/2016 09:00 AM 1.16 0.70 - 1.25 mg/dL Final    Comment:      For patients > or = 74 years of age: The upper reference limit for Creatinine is approximately 13% higher for people identified as African-American.     02/29/2016 09:11 AM 1.21 0.70 - 1.25 mg/dL Final  Comment:      For patients > or = 74 years of age: The upper reference limit for Creatinine is approximately 13% higher for people identified as African-American.     02/08/2016 09:24 AM 1.36 (H) 0.70 - 1.25 mg/dL Final    Comment:      For patients > or = 74 years of age: The upper reference limit for Creatinine is approximately 13% higher for people identified as African-American.     04/13/2015 09:01 AM 0.99 0.70 - 1.25 mg/dL Final  29/56/2130 86:57 AM 0.97 0.70 - 1.25 mg/dL Final   Creatinine, Ser  Date/Time Value Ref Range Status  06/29/2023 12:25 PM 4.70 (H) 0.61 - 1.24 mg/dL Final  84/69/6295 28:41 PM 4.87 (H) 0.61 - 1.24 mg/dL Final  32/44/0102 72:53 AM 6.80 (H) 0.61 - 1.24 mg/dL Final  66/44/0347 42:59 AM 6.57 (H) 0.76 - 1.27 mg/dL Final  56/38/7564 33:29 AM 7.01 (H) 0.61  - 1.24 mg/dL Final  51/88/4166 06:30 AM 7.36 (H) 0.61 - 1.24 mg/dL Final  16/01/930 35:57 AM 7.31 (H) 0.61 - 1.24 mg/dL Final  32/20/2542 70:62 AM 7.25 (H) 0.61 - 1.24 mg/dL Final  37/62/8315 17:61 AM 6.63 (H) 0.61 - 1.24 mg/dL Final  60/73/7106 26:94 AM 6.47 (H) 0.61 - 1.24 mg/dL Final  85/46/2703 50:09 AM 6.20 (H) 0.61 - 1.24 mg/dL Final  38/18/2993 71:69 AM 5.95 (H) 0.61 - 1.24 mg/dL Final  67/89/3810 17:51 AM 5.89 (H) 0.61 - 1.24 mg/dL Final  02/58/5277 82:42 AM 5.63 (H) 0.61 - 1.24 mg/dL Final  35/36/1443 15:40 AM 4.97 (H) 0.61 - 1.24 mg/dL Final  08/67/6195 09:32 AM 4.62 (H) 0.61 - 1.24 mg/dL Final  67/12/4578 99:83 AM 4.74 (H) 0.61 - 1.24 mg/dL Final  38/25/0539 76:73 AM 4.69 (H) 0.61 - 1.24 mg/dL Final  41/93/7902 40:97 PM 4.80 (H) 0.61 - 1.24 mg/dL Final  35/32/9924 26:83 AM 1.24 0.76 - 1.27 mg/dL Final   Recent Labs  Lab 06/27/23 0626 06/29/23 1220 06/29/23 1225  NA 138 136 136  136  K 3.8 3.9 3.9  3.9  CL 106 103 101  CO2  --  22  --   GLUCOSE 99 96 90  BUN 110* 83* 84*  CREATININE 6.80* 4.87* 4.70*  CALCIUM   --  7.1*  --    Recent Labs  Lab 06/27/23 0626 06/29/23 1220 06/29/23 1225  WBC  --  1.5*  --   NEUTROABS  --  0.5*  --   HGB 7.5* 8.8* 8.5*  8.5*  HCT 22.0* 26.6* 25.0*  25.0*  MCV  --  92.0  --   PLT  --  131*  --    Liver Function Tests: Recent Labs  Lab 06/29/23 1220  AST 20  ALT 9  ALKPHOS 154*  BILITOT 0.5  PROT 4.8*  ALBUMIN  2.2*   Recent Labs  Lab 06/29/23 1232  LIPASE 28   No results for input(s): AMMONIA in the last 168 hours. Cardiac Enzymes: No results for input(s): CKTOTAL, CKMB, CKMBINDEX, TROPONINI in the last 168 hours. Iron  Studies: No results for input(s): IRON , TIBC, TRANSFERRIN, FERRITIN in the last 72 hours. PT/INR: @LABRCNTIP (inr:5)  Xrays/Other Studies: ) Results for orders placed or performed during the hospital encounter of 06/29/23 (from the past 48 hours)  CBC with Differential      Status: Abnormal   Collection Time: 06/29/23 12:20 PM  Result Value Ref Range   WBC 1.5 (L) 4.0 - 10.5 K/uL   RBC 2.89 (L) 4.22 -  5.81 MIL/uL   Hemoglobin 8.8 (L) 13.0 - 17.0 g/dL   HCT 16.1 (L) 09.6 - 04.5 %   MCV 92.0 80.0 - 100.0 fL   MCH 30.4 26.0 - 34.0 pg   MCHC 33.1 30.0 - 36.0 g/dL   RDW 40.9 81.1 - 91.4 %   Platelets 131 (L) 150 - 400 K/uL   nRBC 1.3 (H) 0.0 - 0.2 %   Neutrophils Relative % 29 %   Neutro Abs 0.5 (L) 1.7 - 7.7 K/uL   Band Neutrophils 4 %   Lymphocytes Relative 47 %   Lymphs Abs 0.7 0.7 - 4.0 K/uL   Monocytes Relative 6 %   Monocytes Absolute 0.1 0.1 - 1.0 K/uL   Eosinophils Relative 4 %   Eosinophils Absolute 0.1 0.0 - 0.5 K/uL   Basophils Relative 1 %   Basophils Absolute 0.0 0.0 - 0.1 K/uL   WBC Morphology Moderate Left Shift (>5% metas and myelos)    RBC Morphology See Note    Smear Review Normal platelet morphology    Metamyelocytes Relative 4 %   Myelocytes 5 %   Abs Immature Granulocytes 0.10 (H) 0.00 - 0.07 K/uL   Burr Cells PRESENT     Comment: Performed at [facility], [address] [place], [place] [address]  Comprehensive metabolic panel     Status: Abnormal   Collection Time: 06/29/23 12:20 PM  Result Value Ref Range   Sodium 136 135 - 145 mmol/L   Potassium 3.9 3.5 - 5.1 mmol/L   Chloride 103 98 - 111 mmol/L   CO2 22 22 - 32 mmol/L   Glucose, Bld 96 70 - 99 mg/dL    Comment: Glucose reference range applies only to samples taken after fasting for at least 8 hours.   BUN 83 (H) 8 - 23 mg/dL   Creatinine, Ser 6.21 (H) 0.61 - 1.24 mg/dL   Calcium  7.1 (L) 8.9 - 10.3 mg/dL   Total Protein 4.8 (L) 6.5 - 8.1 g/dL   Albumin  2.2 (L) 3.5 - 5.0 g/dL   AST 20 15 - 41 U/L   ALT 9 0 - 44 U/L   Alkaline Phosphatase 154 (H) 38 - 126 U/L   Total Bilirubin 0.5 0.0 - 1.2 mg/dL   GFR, Estimated 12 (L) >60 mL/min    Comment: (NOTE) Calculated using the CKD-EPI Creatinine Equation (2021)    Anion gap 11 5 - 15    Comment: Performed at  [facility], [address], [place], [place] [address]  Troponin I (High Sensitivity)     Status: Abnormal   Collection Time: 06/29/23 12:20 PM  Result Value Ref Range   Troponin I (High Sensitivity) 116 (HH) <18 ng/L    Comment: CRITICAL RESULT CALLED TO, READ BACK BY AND VERIFIED WITH XANINA HOWE RN.@[email or web address]  ON 6.19.25 BY TCALDWELL MT. (NOTE) Elevated high sensitivity troponin I (hsTnI) values and significant  changes across serial measurements may suggest ACS but many other  chronic and acute conditions are known to elevate hsTnI results.  Refer to the Links section for chest pain algorithms and additional  guidance. Performed at [facility], [address], [place], [place] [address]   Resp panel by RT-PCR (RSV, Flu A&B, Covid) Anterior Nasal Swab     Status: None   Collection Time: 06/29/23 12:21 PM   Specimen: Anterior Nasal Swab  Result Value Ref Range   SARS Coronavirus 2 by RT PCR NEGATIVE NEGATIVE   Influenza  A by PCR NEGATIVE NEGATIVE   Influenza B by PCR NEGATIVE NEGATIVE    Comment: (NOTE) The Xpert Xpress SARS-CoV-2/FLU/RSV plus assay is intended as an aid in the diagnosis of influenza from Nasopharyngeal swab specimens and should not be used as a sole basis for treatment. Nasal washings and aspirates are unacceptable for Xpert Xpress SARS-CoV-2/FLU/RSV testing.  Fact Sheet for Patients: BloggerCourse.com  Fact Sheet for Healthcare Providers: SeriousBroker.it  This test is not yet approved or cleared by the [place]  FDA and has been authorized for detection and/or diagnosis of SARS-CoV-2 by FDA under an Emergency Use Authorization (EUA). This EUA will remain in effect (meaning this test can be used) for the duration of the COVID-19 declaration under Section 564(b)(1) of the Act, 21 U.S.C. section 360bbb-3(b)(1), unless the authorization is terminated or revoked.     Resp Syncytial Virus by  PCR NEGATIVE NEGATIVE    Comment: (NOTE) Fact Sheet for Patients: BloggerCourse.com  Fact Sheet for Healthcare Providers: SeriousBroker.it  This test is not yet approved or cleared by the [place]  FDA and has been authorized for detection and/or diagnosis of SARS-CoV-2 by FDA under an Emergency Use Authorization (EUA). This EUA will remain in effect (meaning this test can be used) for the duration of the COVID-19 declaration under Section 564(b)(1) of the Act, 21 U.S.C. section 360bbb-3(b)(1), unless the authorization is terminated or revoked.  Performed at [facility], [address] [place], [place] [address]   I-Stat venous blood gas, [facility] ED, [facility], [facility])     Status: Abnormal   Collection Time: 06/29/23 12:25 PM  Result Value Ref Range   pH, Ven 7.284 7.25 - 7.43   pCO2, Ven 45.1 44 - 60 mmHg   pO2, Ven 41 32 - 45 mmHg   Bicarbonate 21.4 20.0 - 28.0 mmol/L   TCO2 23 22 - 32 mmol/L   O2 Saturation 70 %   Acid-base deficit 5.0 (H) 0.0 - 2.0 mmol/L   Sodium 136 135 - 145 mmol/L   Potassium 3.9 3.5 - 5.1 mmol/L   Calcium , Ion 1.03 (L) 1.15 - 1.40 mmol/L   HCT 25.0 (L) 39.0 - 52.0 %   Hemoglobin 8.5 (L) 13.0 - 17.0 g/dL   Sample type VENOUS   I-Stat CG4 Lactic Acid     Status: Abnormal   Collection Time: 06/29/23 12:25 PM  Result Value Ref Range   Lactic Acid, Venous 2.8 (HH) 0.5 - 1.9 mmol/L   Comment NOTIFIED PHYSICIAN   I-stat chem 8, ED (not at [facility], [facility] or [facility])     Status: Abnormal   Collection Time: 06/29/23 12:25 PM  Result Value Ref Range   Sodium 136 135 - 145 mmol/L   Potassium 3.9 3.5 - 5.1 mmol/L   Chloride 101 98 - 111 mmol/L   BUN 84 (H) 8 - 23 mg/dL   Creatinine, Ser 7.82 (H) 0.61 - 1.24 mg/dL   Glucose, Bld 90 70 - 99 mg/dL    Comment: Glucose reference range applies only to samples taken after fasting for at least 8 hours.   Calcium , Ion 1.02 (L) 1.15 - 1.40 mmol/L   TCO2 21 (L) 22 - 32 mmol/L    Hemoglobin 8.5 (L) 13.0 - 17.0 g/dL   HCT 95.6 (L) 21.3 - 08.6 %  Lipase, blood     Status: None   Collection Time: 06/29/23 12:32 PM  Result Value Ref Range   Lipase 28 11 - 51 U/L    Comment: Performed  at [facility], [address] [place], [place] [address]  Troponin I (High Sensitivity)     Status: Abnormal   Collection Time: 06/29/23  2:20 PM  Result Value Ref Range   Troponin I (High Sensitivity) 92 (H) <18 ng/L    Comment: DELTA CHECK NOTED (NOTE) Elevated high sensitivity troponin I (hsTnI) values and significant  changes across serial measurements may suggest ACS but many other  chronic and acute conditions are known to elevate hsTnI results.  Refer to the Links section for chest pain algorithms and additional  guidance. Performed at [facility], [address], [place], [place] [address]   I-Stat CG4 Lactic Acid     Status: None   Collection Time: 06/29/23  3:03 PM  Result Value Ref Range   Lactic Acid, Venous 1.8 0.5 - 1.9 mmol/L   CT Angio Chest PE W/Cm &/Or Wo Cm Result Date: 06/29/2023 CLINICAL DATA:  Respiratory distress. Acute, non localized abdominal pain. Previous right lower lobe resection. EXAM: CT ANGIOGRAPHY CHEST CT ABDOMEN AND PELVIS WITH CONTRAST TECHNIQUE: Multidetector CT imaging of the chest was performed using the standard protocol during bolus administration of intravenous contrast. Multiplanar CT image reconstructions and MIPs were obtained to evaluate the vascular anatomy. Multidetector CT imaging of the abdomen and pelvis was performed using the standard protocol during bolus administration of intravenous contrast. RADIATION DOSE REDUCTION: This exam was performed according to the departmental dose-optimization program which includes automated exposure control, adjustment of the mA and/or kV according to patient size and/or use of iterative reconstruction technique. CONTRAST:  75mL OMNIPAQUE  IOHEXOL  350 MG/ML SOLN COMPARISON:  Portable  chest obtained earlier today. Chest CTA dated 11/22/2022. PET-CT dated 04/18/2022. Abdomen and pelvis CT dated 06/01/2009. FINDINGS: CTA CHEST FINDINGS Cardiovascular: Normally opacified pulmonary arteries with no pulmonary arterial filling defects seen. Surgically absent right lower lobe pulmonary arteries. Prominent central pulmonary arteries with a main pulmonary artery diameter of 3.1 cm. Stable mildly enlarged heart. Minimal pericardial effusion with improvement. No significant residual pericardial fluid. Atheromatous calcifications, including the coronary arteries and aorta. Mediastinum/Nodes: No enlarged mediastinal, hilar, or axillary lymph nodes. Thyroid  gland, trachea, and esophagus demonstrate no significant findings. Lungs/Pleura: Dense left lower lobe consolidation with air bronchograms. Stable bilateral centrilobular and paraseptal bullous emphysema. Associated minimal diffuse bilateral ground glass opacity. Minimal bilateral pleural fluid. Mild right lower lobe atelectasis. Stable left lower lobe calcified granuloma. No lung nodules currently visualized. Musculoskeletal: Marked thoracic and moderate lower cervical spine degenerative changes. Review of the MIP images confirms the above findings. CT ABDOMEN and PELVIS FINDINGS Hepatobiliary: No focal liver abnormality is seen. No gallstones, gallbladder wall thickening, or biliary dilatation. Pancreas: Unremarkable. No pancreatic ductal dilatation or surrounding inflammatory changes. Spleen: Multiple calcified splenic granulomata. Adrenals/Urinary Tract: Normal-appearing adrenal glands. Bilateral simple appearing renal cysts, not needing imaging follow-up. Mild increase in bilateral perinephric soft tissue stranding and fluid. Unremarkable ureters and urinary bladder. No urinary tract calculi or hydronephrosis seen. Stomach/Bowel: Stomach is within normal limits. Appendix appears normal. No evidence of bowel wall thickening, distention, or inflammatory  changes. Vascular/Lymphatic: Atheromatous arterial calcifications without aneurysm. No enlarged lymph nodes. Reproductive: Mildly enlarged prostate. Other: Small amount of free peritoneal fluid. Extensive bilateral subcutaneous edema. Musculoskeletal: Lumbar and lower thoracic spine degenerative changes. Review of the MIP images confirms the above findings. IMPRESSION: 1. No pulmonary emboli. 2. Dense left lower lobe consolidation with air bronchograms, compatible with severe pneumonia or consolidative atelectasis. 3. Minimal bilateral pleural fluid with minimal diffuse  bilateral ground glass opacity, compatible with pulmonary edema. 4. Stable mild cardiomegaly. 5. Prominent central pulmonary arteries, suggesting pulmonary arterial hypertension. 6. Anasarca with a small amount of ascites, increased perinephric fluid bilaterally and diffuse subcutaneous edema. 7.  Calcific coronary artery and aortic atherosclerosis. 8. Extensive bilateral emphysema. 9. Mildly enlarged prostate. Aortic Atherosclerosis (ICD10-I70.0) and Emphysema (ICD10-J43.9). Electronically Signed   By: Catherin Closs M.D.   On: 06/29/2023 16:46   CT ABDOMEN PELVIS W CONTRAST Result Date: 06/29/2023 CLINICAL DATA:  Respiratory distress. Acute, non localized abdominal pain. Previous right lower lobe resection. EXAM: CT ANGIOGRAPHY CHEST CT ABDOMEN AND PELVIS WITH CONTRAST TECHNIQUE: Multidetector CT imaging of the chest was performed using the standard protocol during bolus administration of intravenous contrast. Multiplanar CT image reconstructions and MIPs were obtained to evaluate the vascular anatomy. Multidetector CT imaging of the abdomen and pelvis was performed using the standard protocol during bolus administration of intravenous contrast. RADIATION DOSE REDUCTION: This exam was performed according to the departmental dose-optimization program which includes automated exposure control, adjustment of the mA and/or kV according to patient size  and/or use of iterative reconstruction technique. CONTRAST:  75mL OMNIPAQUE  IOHEXOL  350 MG/ML SOLN COMPARISON:  Portable chest obtained earlier today. Chest CTA dated 11/22/2022. PET-CT dated 04/18/2022. Abdomen and pelvis CT dated 06/01/2009. FINDINGS: CTA CHEST FINDINGS Cardiovascular: Normally opacified pulmonary arteries with no pulmonary arterial filling defects seen. Surgically absent right lower lobe pulmonary arteries. Prominent central pulmonary arteries with a main pulmonary artery diameter of 3.1 cm. Stable mildly enlarged heart. Minimal pericardial effusion with improvement. No significant residual pericardial fluid. Atheromatous calcifications, including the coronary arteries and aorta. Mediastinum/Nodes: No enlarged mediastinal, hilar, or axillary lymph nodes. Thyroid  gland, trachea, and esophagus demonstrate no significant findings. Lungs/Pleura: Dense left lower lobe consolidation with air bronchograms. Stable bilateral centrilobular and paraseptal bullous emphysema. Associated minimal diffuse bilateral ground glass opacity. Minimal bilateral pleural fluid. Mild right lower lobe atelectasis. Stable left lower lobe calcified granuloma. No lung nodules currently visualized. Musculoskeletal: Marked thoracic and moderate lower cervical spine degenerative changes. Review of the MIP images confirms the above findings. CT ABDOMEN and PELVIS FINDINGS Hepatobiliary: No focal liver abnormality is seen. No gallstones, gallbladder wall thickening, or biliary dilatation. Pancreas: Unremarkable. No pancreatic ductal dilatation or surrounding inflammatory changes. Spleen: Multiple calcified splenic granulomata. Adrenals/Urinary Tract: Normal-appearing adrenal glands. Bilateral simple appearing renal cysts, not needing imaging follow-up. Mild increase in bilateral perinephric soft tissue stranding and fluid. Unremarkable ureters and urinary bladder. No urinary tract calculi or hydronephrosis seen. Stomach/Bowel:  Stomach is within normal limits. Appendix appears normal. No evidence of bowel wall thickening, distention, or inflammatory changes. Vascular/Lymphatic: Atheromatous arterial calcifications without aneurysm. No enlarged lymph nodes. Reproductive: Mildly enlarged prostate. Other: Small amount of free peritoneal fluid. Extensive bilateral subcutaneous edema. Musculoskeletal: Lumbar and lower thoracic spine degenerative changes. Review of the MIP images confirms the above findings. IMPRESSION: 1. No pulmonary emboli. 2. Dense left lower lobe consolidation with air bronchograms, compatible with severe pneumonia or consolidative atelectasis. 3. Minimal bilateral pleural fluid with minimal diffuse bilateral ground glass opacity, compatible with pulmonary edema. 4. Stable mild cardiomegaly. 5. Prominent central pulmonary arteries, suggesting pulmonary arterial hypertension. 6. Anasarca with a small amount of ascites, increased perinephric fluid bilaterally and diffuse subcutaneous edema. 7.  Calcific coronary artery and aortic atherosclerosis. 8. Extensive bilateral emphysema. 9. Mildly enlarged prostate. Aortic Atherosclerosis (ICD10-I70.0) and Emphysema (ICD10-J43.9). Electronically Signed   By: Catherin Closs M.D.   On: 06/29/2023 16:46   CT Head  Wo Contrast Result Date: 06/29/2023 CLINICAL DATA:  Head trauma, minor (Age >= 65y); Neck trauma (Age >= 65y). Fall yesterday, landing on the right side of the neck. Neck pain. EXAM: CT HEAD WITHOUT CONTRAST CT CERVICAL SPINE WITHOUT CONTRAST TECHNIQUE: Multidetector CT imaging of the head and cervical spine was performed following the standard protocol without intravenous contrast. Multiplanar CT image reconstructions of the cervical spine were also generated. RADIATION DOSE REDUCTION: This exam was performed according to the departmental dose-optimization program which includes automated exposure control, adjustment of the mA and/or kV according to patient size and/or use of  iterative reconstruction technique. COMPARISON:  None Available. FINDINGS: CT HEAD FINDINGS Brain: There is no evidence of an acute infarct, intracranial hemorrhage, mass, midline shift, or extra-axial fluid collection. Cerebral volume is normal. The ventricles are normal in size. Vascular: Calcified atherosclerosis at the skull base. No hyperdense vessel. Skull: No fracture or suspicious lesion. Sinuses/Orbits: Minimal mucosal thickening in the right maxillary sinus. Clear mastoid air cells. Unremarkable orbits. Other: None. CT CERVICAL SPINE FINDINGS Alignment: Mild reversal of the normal cervical lordosis. Grade 1 anterolisthesis of C2 on C3 and C3 on C4. Trace retrolisthesis of C5 on C6. Skull base and vertebrae: No acute fracture or suspicious lesion. Soft tissues and spinal canal: No prevertebral fluid or swelling. No visible canal hematoma. Disc levels: Advanced disc degeneration at C5-6 and moderate degeneration at C3-4 and C4-5. Advanced facet arthrosis on the right at C2-3 and on the left at C3-4. Moderate to severe neural foraminal stenosis at each of these levels. Suspected mild spinal stenosis at C3-4 and C4-5 and potentially moderate spinal stenosis at C5-6. Upper chest: Largely excluded. Please see the separate contemporaneous CTA of the chest for evaluation. Other: Moderate atherosclerotic calcification of the carotid bifurcations. Partially visualized right internal jugular venous catheter. IMPRESSION: 1. No evidence of acute intracranial abnormality or cervical spine fracture. 2. Cervical disc and facet degeneration as above. Electronically Signed   By: Aundra Lee M.D.   On: 06/29/2023 16:14   CT Cervical Spine Wo Contrast Result Date: 06/29/2023 CLINICAL DATA:  Head trauma, minor (Age >= 65y); Neck trauma (Age >= 65y). Fall yesterday, landing on the right side of the neck. Neck pain. EXAM: CT HEAD WITHOUT CONTRAST CT CERVICAL SPINE WITHOUT CONTRAST TECHNIQUE: Multidetector CT imaging of the  head and cervical spine was performed following the standard protocol without intravenous contrast. Multiplanar CT image reconstructions of the cervical spine were also generated. RADIATION DOSE REDUCTION: This exam was performed according to the departmental dose-optimization program which includes automated exposure control, adjustment of the mA and/or kV according to patient size and/or use of iterative reconstruction technique. COMPARISON:  None Available. FINDINGS: CT HEAD FINDINGS Brain: There is no evidence of an acute infarct, intracranial hemorrhage, mass, midline shift, or extra-axial fluid collection. Cerebral volume is normal. The ventricles are normal in size. Vascular: Calcified atherosclerosis at the skull base. No hyperdense vessel. Skull: No fracture or suspicious lesion. Sinuses/Orbits: Minimal mucosal thickening in the right maxillary sinus. Clear mastoid air cells. Unremarkable orbits. Other: None. CT CERVICAL SPINE FINDINGS Alignment: Mild reversal of the normal cervical lordosis. Grade 1 anterolisthesis of C2 on C3 and C3 on C4. Trace retrolisthesis of C5 on C6. Skull base and vertebrae: No acute fracture or suspicious lesion. Soft tissues and spinal canal: No prevertebral fluid or swelling. No visible canal hematoma. Disc levels: Advanced disc degeneration at C5-6 and moderate degeneration at C3-4 and C4-5. Advanced facet arthrosis on the right at  C2-3 and on the left at C3-4. Moderate to severe neural foraminal stenosis at each of these levels. Suspected mild spinal stenosis at C3-4 and C4-5 and potentially moderate spinal stenosis at C5-6. Upper chest: Largely excluded. Please see the separate contemporaneous CTA of the chest for evaluation. Other: Moderate atherosclerotic calcification of the carotid bifurcations. Partially visualized right internal jugular venous catheter. IMPRESSION: 1. No evidence of acute intracranial abnormality or cervical spine fracture. 2. Cervical disc and facet  degeneration as above. Electronically Signed   By: Aundra Lee M.D.   On: 06/29/2023 16:14   DG Chest Portable 1 View Result Date: 06/29/2023 CLINICAL DATA:  Increased work of breathing, chest injury EXAM: PORTABLE CHEST 1 VIEW COMPARISON:  06/27/2023 FINDINGS: Single frontal view of the chest demonstrates stable right internal jugular dialysis catheter. Cardiac silhouette is stable. New veiling opacity at the left lung base consistent with left lower lobe consolidation and/or effusion. No pneumothorax. Chronic elevation the right hemidiaphragm. IMPRESSION: 1. New left basilar veiling opacity consistent with left lower lobe consolidation and/or effusion. Electronically Signed   By: Bobbye Burrow M.D.   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 OR;  Service: Vascular;  Laterality: N/A;   LUNG LOBECTOMY Right 2008   RLL resection   R knee arthroscopy     REPLACEMENT TOTAL KNEE Right 09/15/2021   Dr. Neil Balls   VASECTOMY      Allergies:  Allergies  Allergen Reactions   Pregabalin Other (See Comments)    Elevated LFTs   Lisinopril Nausea Only   Merbromin Hives   Thimerosal (Thiomersal) Hives   Ferrous Sulfate  Nausea Only, Other (See Comments) and Nausea And Vomiting    325 mg once a day caused EXTREME NAUSEA    Medications:   Prior to Admission medications   Medication Sig  Start Date End Date Taking? Authorizing Provider  albuterol  (VENTOLIN  HFA) 108 (90 Base) MCG/ACT inhaler Inhale 2 puffs into the lungs every 4 (four) hours as needed. Patient taking differently: Inhale 2 puffs into the lungs every 4 (four) hours as needed for shortness of breath or wheezing. 12/08/20   Breeback, Jade L, PA-C  amLODipine  (NORVASC ) 10 MG tablet Take 10 mg by mouth daily. 05/16/23   [provider]  aspirin EC 81 MG tablet Take 81 mg by mouth daily.    [provider]  atorvastatin  (LIPITOR) 40 MG tablet Take 1 tablet (40 mg total) by mouth daily. 11/29/22   Breeback, Jade L, PA-C  carvedilol  (COREG ) 6.25 MG tablet Take 1 tablet (6.25 mg total) by mouth 2 (two) times daily with a meal. 06/13/23 08/12/23  Breeback, Jade L, PA-C  clindamycin (CLEOCIN T) 1 % external solution Apply 1 Application topically 2 (two) times daily as needed (for scalp irritation).    [provider]  clobetasol  cream (TEMOVATE) 0.05 % Apply 1 Application topically 2 (two) times daily as needed (for scalp irritation).    [provider]  Continuous Glucose Sensor (FREESTYLE LIBRE 3 SENSOR) MISC 1 each by Does not apply route every 14 (fourteen) days. Please apply for 14 days and then switch to new sensor 06/02/23   Breeback, Jade L, PA-C  cyanocobalamin  (VITAMIN B12) 1000 MCG tablet Take 1,000 mcg by mouth daily.    [provider]  DULoxetine  (CYMBALTA ) 30 MG capsule Take 1 capsule (30 mg total) by mouth daily. 06/07/23   Breeback, Jade L, PA-C  Fluticasone -Umeclidin-Vilant (TRELEGY ELLIPTA ) 100-62.5-25 MCG/ACT AEPB Inhale 1 puff into the lungs daily in the afternoon. Patient taking differently: Inhale 1 puff into the lungs daily as needed (Shortness of breath). 04/05/22   Wilder Handy, MD  Glucagon  (GVOKE HYPOPEN  2-PACK) 1 MG/0.2ML SOAJ Inject 1mg  pen into outer thigh or lower abdomen if blood sugar less than 54. 05/19/23   Breeback, Jade L, PA-C  insulin  glargine, 1 Unit Dial,  (TOUJEO  SOLOSTAR) 300 UNIT/ML Solostar Pen Increase by 2 units every 5 days until fasting sugars 90-120. Max daily dosage 20 units Patient taking differently: Inject 2-3 Units into the skin See admin instructions. Take 2 units in the morning and 3 units at bedtime Max daily dosage 20 units 05/25/23   Breeback, Jade L, PA-C  levothyroxine  (SYNTHROID ) 175 MCG tablet Take 1 tablet (175 mcg total) by mouth daily before breakfast. Patient taking differently: Take 150 mcg by mouth daily before breakfast. 06/13/23   Breeback, Jade L, PA-C  Melatonin 10 MG TABS Take 10 mg by mouth at bedtime.    [provider]  pantoprazole  (PROTONIX ) 40 MG tablet Take 1 tablet (40 mg total) by mouth daily. 05/11/23 07/10/23  Etter Hermann., MD  predniSONE  (DELTASONE ) 20 MG tablet Take 20 mg by mouth daily with breakfast.    [provider]  sodium bicarbonate  650 MG tablet Take 1 tablet (650 mg total) by mouth 3 (three) times daily. Patient taking differently: Take 1,300 mg by mouth 3 (three) times daily. 05/11/23 07/10/23  Etter Hermann., MD  Vitamin D , Ergocalciferol , (DRISDOL ) 1.25 MG (50000 UNIT) CAPS capsule Take 1 capsule (50,000 Units total) by mouth every 7 (seven) days. 05/15/23   Etter Hermann., MD    Discontinued Meds:   Medications Discontinued During This Encounter  Medication Reason   albuterol  (VENTOLIN  HFA) 108 (90 Base) MCG/ACT inhaler Returned to [facility]    Social History:  reports that he quit smoking about 18 years ago. His smoking use included cigarettes and cigars. He started smoking about 48 years ago. He has a 30 pack-year smoking history. He has been exposed to tobacco smoke. He has never used smokeless tobacco. He reports current alcohol use of about 21.0 standard drinks of alcohol per week. He reports that he does not use drugs.  Family History:   Family History  Problem Relation Age of Onset   Heart attack Father    Hyperlipidemia Father    Hypertension  Father    Heart disease Father    Diabetes Other        grandmother     Blood pressure 108/67, pulse 93, temperature 100.2 F (37.9 C), temperature source Temporal, resp. rate (!) 27, height 5' 8 (1.727 m), weight 78.5 kg, SpO2 94%. General appearance: alert, cooperative, appears stated age, and mild distress Head: Normocephalic, without obvious abnormality, atraumatic Eyes: negative Neck: no adenopathy, no carotid  bruit, supple, symmetrical, trachea midline, and thyroid  not enlarged, symmetric, no tenderness/mass/nodules Back: symmetric, no curvature. ROM normal. No CVA tenderness. Resp: clear to auscultation bilaterally Cardio: regular rate and rhythm, S1, S2 normal, no murmur, click, rub or gallop GI: mild distension but soft and no rebound Extremities: edema trace Pulses: 2+ and symmetric Skin: Skin color, texture, turgor normal. No rashes or lesions Access: RIJ TC       Patrick Boor, MD 06/29/2023, 7:03 PM

## 2023-06-29 NOTE — ED Notes (Signed)
 Portable xray at bedside.

## 2023-06-29 NOTE — Progress Notes (Signed)
 RT transported pt on bipap from ED 15 to CT without any issues

## 2023-06-29 NOTE — ED Notes (Signed)
 Per MD, ok to trial patient of Bi-pap at this time. Respiratory made aware.

## 2023-06-29 NOTE — ED Notes (Addendum)
 To CT on monitor and Bipap with RN and RT, no changes. Lactic and BP improved.

## 2023-06-29 NOTE — ED Provider Notes (Signed)
 Patient ESRD, recently started dialysis. Benefits outweigh risks of receiving IV contrast and will need contrast study today to evaluate for PE or other etiology of his symptoms.   Kingsley, Jadis Mika K, DO 06/29/23 1320

## 2023-06-29 NOTE — ED Notes (Signed)
 CCMD called.

## 2023-06-29 NOTE — ED Notes (Signed)
 Alert, NAD, calm, interactive. RT at [facility]. Pump Back in place. Trial off of Bipap. Tolerating moderately well. Increased wob remains. Will monitor, VSS. Wife t BS. 2nd IV and 2nd BC obtained.

## 2023-06-29 NOTE — Progress Notes (Signed)
 RT took pt off bipap per MD and placed pt on 4L [place], sats currently 93%. Pt is having some trouble breathing while off bipap. RT gave pt a breathing treatment and pt states that helped. Bipap at bedside if needed.

## 2023-06-29 NOTE — H&P (Signed)
 History and Physical   Walter Wilkerson ZOX:[id number] DOB: 08-02-1950 DOA: 06/29/2023  PCP: Araceli Knight, PA-C   Patient coming from: Home  Chief Complaint: Shortness of breath/respiratory distress  HPI: Walter Wilkerson is a 73 y.o. male with medical history significant of hypertension, hypothyroidism, ESRD, colon cancer status postresection, anemia, thrombocytopenia, thoracic aortic aneurysm, pericardial effusion, chronic pain presenting with shortness of breath and respiratory distress.  Patient has had some degree of shortness of breath which has been worsening for the past couple days.  Patient is newly ESRD and started dialysis today.  He was able to complete a partial session, but about 2 hours into it he began to have significant and sudden increase in his shortness of breath and decreasing his blood pressure.  Session was stopped and EMS was called.  EMS initially concern for patient appearing cyanotic and noted to be hypoxic.  Started on CPAP and route to the ED.  No wheezing heard but patient was given a DuoNeb for improved aeration without improvement in symptoms.  Patient reports he has had some ongoing edema.  Did have a fall yesterday with some neck pain after.  No wheezing.  Denies chills, abdominal pain, constipation, diarrhea, nausea, vomiting.  ED Course: Vital signs in the ED notable for blood pressure in the 80s-100 systolic.  Requiring BiPAP to maintain saturations.  Lab workup included CMP with BUN 83, creatinine 4.87, calcium  7.1, protein 4.8, albumin  2.2, alk phos 184.  CBC with worsened leukopenia to 1.5 and anemia stable at 8.8, platelets stable at 131.  Troponin downtrending at 116, 92.  Lipase normal.  Lactic acid 2.8 with repeat normal.  Respiratory panel for flu COVID and RSV normal.  BNP pending.  Blood cultures pending.  Imaging included chest x-ray which showed left basilar opacity.  CTA PE study showed no PE but did show left lower lobe consolidation with air  bronchograms consistent with severe pneumonia versus a consolidative atelectasis.  Also noted was changes consistent with mild pulmonary edema, diffuse emphysema.  Abdominal CT with contrast showed evidence of anasarca with small ascites as well as mild prostatomegaly.  CT head showed no acute normality and CT C-spine showed no acute abnormality.  Patient received ceftriaxone, azithromycin , Solu-Cortef, 500 cc IV fluid, DuoNeb in the ED.  EDP to consult nephrology for dialysis while admitted.  Review of Systems: As per HPI otherwise all other systems reviewed and are negative.  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 Neil Balls   VASECTOMY      Social History  reports that he quit smoking about 18 years ago. His smoking use included cigarettes and cigars. He started smoking about 48 years ago. He has a  30 pack-year smoking history. He has been exposed to tobacco smoke. He has never used smokeless tobacco. He reports current alcohol use of about 21.0 standard drinks of alcohol per week. He reports that he does not use drugs.  Allergies  Allergen Reactions   Pregabalin Other (See Comments)    Elevated LFTs  Other Reaction(s): Unknown   Lisinopril Nausea Only and  Nausea And Vomiting   Merbromin Hives    Other Reaction(s): Hives (Urticaria)   Thimerosal (Thiomersal) Hives    Other Reaction(s): Hives (Urticaria)   Ferrous Sulfate  Nausea Only, Other (See Comments) and Nausea And Vomiting    325 mg once a day caused EXTREME NAUSEA    Family History  Problem Relation Age of Onset   Heart attack Father    Hyperlipidemia Father    Hypertension Father    Heart disease Father    Diabetes Other        grandmother   Reviewed on admission  Prior to Admission medications   Medication Sig Start Date End Date Taking? Authorizing Provider  albuterol  (VENTOLIN  HFA) 108 (90 Base) MCG/ACT inhaler Inhale 2 puffs into the lungs every 4 (four) hours as needed. Patient taking differently: Inhale 2 puffs into the lungs every 4 (four) hours as needed for shortness of breath or wheezing. 12/08/20   Breeback, Jade L, PA-C  amLODipine  (NORVASC ) 10 MG tablet Take 10 mg by mouth daily. 05/16/23   [provider]  aspirin EC 81 MG tablet Take 81 mg by mouth daily.    [provider]  atorvastatin  (LIPITOR) 40 MG tablet Take 1 tablet (40 mg total) by mouth daily. 11/29/22   Breeback, Jade L, PA-C  carvedilol  (COREG ) 6.25 MG tablet Take 1 tablet (6.25 mg total) by mouth 2 (two) times daily with a meal. 06/13/23 08/12/23  Breeback, Jade L, PA-C  clindamycin (CLEOCIN T) 1 % external solution Apply 1 Application topically 2 (two) times daily as needed (for scalp irritation).    [provider]  clobetasol cream (TEMOVATE) 0.05 % Apply 1 Application topically 2 (two) times daily as needed (for scalp irritation).    [provider]  Continuous Glucose Sensor (FREESTYLE LIBRE 3 SENSOR) MISC 1 each by Does not apply route every 14 (fourteen) days. Please apply for 14 days and then switch to new sensor 06/02/23   Breeback, Jade L, PA-C  cyanocobalamin  (VITAMIN B12) 1000 MCG tablet Take 1,000 mcg by mouth daily.    [provider]  DULoxetine   (CYMBALTA ) 30 MG capsule Take 1 capsule (30 mg total) by mouth daily. 06/07/23   Breeback, Jade L, PA-C  Fluticasone -Umeclidin-Vilant (TRELEGY ELLIPTA ) 100-62.5-25 MCG/ACT AEPB Inhale 1 puff into the lungs daily in the afternoon. Patient taking differently: Inhale 1 puff into the lungs daily as needed (Shortness of breath). 04/05/22   Wilder Handy, MD  Glucagon  (GVOKE HYPOPEN  2-PACK) 1 MG/0.2ML SOAJ Inject 1mg  pen into outer thigh or lower abdomen if blood sugar less than 54. 05/19/23   Breeback, Jade L, PA-C  insulin  glargine, 1 Unit Dial, (TOUJEO  SOLOSTAR) 300 UNIT/ML Solostar Pen Increase by 2 units every 5 days until fasting sugars 90-120. Max daily dosage 20 units Patient taking differently: Inject 2-3 Units into the skin See admin instructions. Take 2 units in the morning and 3 units at bedtime Max daily dosage 20 units 05/25/23   Breeback, Jade L, PA-C  levothyroxine  (SYNTHROID ) 175 MCG tablet Take 1 tablet (175 mcg total) by mouth daily before breakfast. Patient taking differently:  Take 150 mcg by mouth daily before breakfast. 06/13/23   Breeback, Jade L, PA-C  Melatonin 10 MG TABS Take 10 mg by mouth at bedtime.    [provider]  pantoprazole  (PROTONIX ) 40 MG tablet Take 1 tablet (40 mg total) by mouth daily. 05/11/23 07/10/23  Etter Hermann., MD  predniSONE  (DELTASONE ) 20 MG tablet Take 20 mg by mouth daily with breakfast.    [provider]  sodium bicarbonate  650 MG tablet Take 1 tablet (650 mg total) by mouth 3 (three) times daily. Patient taking differently: Take 1,300 mg by mouth 3 (three) times daily. 05/11/23 07/10/23  Etter Hermann., MD  Vitamin D , Ergocalciferol , (DRISDOL ) 1.25 MG (50000 UNIT) CAPS capsule Take 1 capsule (50,000 Units total) by mouth every 7 (seven) days. 05/15/23   Etter Hermann., MD    Physical Exam: Vitals:   06/29/23 1704 06/29/23 1715 06/29/23 1730 06/29/23 1745  BP: (!) 108/59 99/60 (!) 83/56 108/67  Pulse: 92 88 91 92   Resp: (!) 23 20 (!) 23 (!) 28  Temp: 100.2 F (37.9 C)     TempSrc: Temporal     SpO2: 100% 100% 99% 99%  Weight:      Height:        Physical Exam Constitutional:      General: He is not in acute distress.    Appearance: Normal appearance.  HENT:     Head: Normocephalic and atraumatic.     Mouth/Throat:     Mouth: Mucous membranes are moist.     Pharynx: Oropharynx is clear.   Eyes:     Extraocular Movements: Extraocular movements intact.     Pupils: Pupils are equal, round, and reactive to light.    Cardiovascular:     Rate and Rhythm: Normal rate and regular rhythm.     Pulses: Normal pulses.     Heart sounds: Normal heart sounds.  Pulmonary:     Effort: Pulmonary effort is normal. No respiratory distress.     Breath sounds: Normal breath sounds.  Abdominal:     General: Bowel sounds are normal. There is no distension.     Palpations: Abdomen is soft.     Tenderness: There is no abdominal tenderness.   Musculoskeletal:        General: No swelling or deformity.     Right lower leg: Edema present.     Left lower leg: Edema present.   Skin:    General: Skin is warm and dry.   Neurological:     General: No focal deficit present.     Mental Status: Mental status is at baseline.    Labs on Admission: I have personally reviewed following labs and imaging studies  CBC: Recent Labs  Lab 06/27/23 0626 06/29/23 1220 06/29/23 1225  WBC  --  1.5*  --   NEUTROABS  --  0.5*  --   HGB 7.5* 8.8* 8.5*  8.5*  HCT 22.0* 26.6* 25.0*  25.0*  MCV  --  92.0  --   PLT  --  131*  --     Basic Metabolic Panel: Recent Labs  Lab 06/27/23 0626 06/29/23 1220 06/29/23 1225  NA 138 136 136  136  K 3.8 3.9 3.9  3.9  CL 106 103 101  CO2  --  22  --   GLUCOSE 99 96 90  BUN 110* 83* 84*  CREATININE 6.80* 4.87* 4.70*  CALCIUM   --  7.1*  --  GFR: Estimated Creatinine Clearance: 13.5 mL/min (A) (by C-G formula based on SCr of 4.7 mg/dL (H)).  Liver Function  Tests: Recent Labs  Lab 06/29/23 1220  AST 20  ALT 9  ALKPHOS 154*  BILITOT 0.5  PROT 4.8*  ALBUMIN  2.2*    Urine analysis:    Component Value Date/Time   COLORURINE YELLOW 05/03/2023 0900   APPEARANCEUR CLEAR 05/03/2023 0900   LABSPEC 1.009 05/03/2023 0900   PHURINE 6.0 05/03/2023 0900   GLUCOSEU NEGATIVE 05/03/2023 0900   HGBUR LARGE (A) 05/03/2023 0900   BILIRUBINUR NEGATIVE 05/03/2023 0900   KETONESUR NEGATIVE 05/03/2023 0900   PROTEINUR 30 (A) 05/03/2023 0900   NITRITE NEGATIVE 05/03/2023 0900   LEUKOCYTESUR NEGATIVE 05/03/2023 0900    Radiological Exams on Admission: CT Angio Chest PE W/Cm &/Or Wo Cm Result Date: 06/29/2023 CLINICAL DATA:  Respiratory distress. Acute, non localized abdominal pain. Previous right lower lobe resection. EXAM: CT ANGIOGRAPHY CHEST CT ABDOMEN AND PELVIS WITH CONTRAST TECHNIQUE: Multidetector CT imaging of the chest was performed using the standard protocol during bolus administration of intravenous contrast. Multiplanar CT image reconstructions and MIPs were obtained to evaluate the vascular anatomy. Multidetector CT imaging of the abdomen and pelvis was performed using the standard protocol during bolus administration of intravenous contrast. RADIATION DOSE REDUCTION: This exam was performed according to the departmental dose-optimization program which includes automated exposure control, adjustment of the mA and/or kV according to patient size and/or use of iterative reconstruction technique. CONTRAST:  75mL OMNIPAQUE  IOHEXOL  350 MG/ML SOLN COMPARISON:  Portable chest obtained earlier today. Chest CTA dated 11/22/2022. PET-CT dated 04/18/2022. Abdomen and pelvis CT dated 06/01/2009. FINDINGS: CTA CHEST FINDINGS Cardiovascular: Normally opacified pulmonary arteries with no pulmonary arterial filling defects seen. Surgically absent right lower lobe pulmonary arteries. Prominent central pulmonary arteries with a main pulmonary artery diameter of 3.1 cm.  Stable mildly enlarged heart. Minimal pericardial effusion with improvement. No significant residual pericardial fluid. Atheromatous calcifications, including the coronary arteries and aorta. Mediastinum/Nodes: No enlarged mediastinal, hilar, or axillary lymph nodes. Thyroid  gland, trachea, and esophagus demonstrate no significant findings. Lungs/Pleura: Dense left lower lobe consolidation with air bronchograms. Stable bilateral centrilobular and paraseptal bullous emphysema. Associated minimal diffuse bilateral ground glass opacity. Minimal bilateral pleural fluid. Mild right lower lobe atelectasis. Stable left lower lobe calcified granuloma. No lung nodules currently visualized. Musculoskeletal: Marked thoracic and moderate lower cervical spine degenerative changes. Review of the MIP images confirms the above findings. CT ABDOMEN and PELVIS FINDINGS Hepatobiliary: No focal liver abnormality is seen. No gallstones, gallbladder wall thickening, or biliary dilatation. Pancreas: Unremarkable. No pancreatic ductal dilatation or surrounding inflammatory changes. Spleen: Multiple calcified splenic granulomata. Adrenals/Urinary Tract: Normal-appearing adrenal glands. Bilateral simple appearing renal cysts, not needing imaging follow-up. Mild increase in bilateral perinephric soft tissue stranding and fluid. Unremarkable ureters and urinary bladder. No urinary tract calculi or hydronephrosis seen. Stomach/Bowel: Stomach is within normal limits. Appendix appears normal. No evidence of bowel wall thickening, distention, or inflammatory changes. Vascular/Lymphatic: Atheromatous arterial calcifications without aneurysm. No enlarged lymph nodes. Reproductive: Mildly enlarged prostate. Other: Small amount of free peritoneal fluid. Extensive bilateral subcutaneous edema. Musculoskeletal: Lumbar and lower thoracic spine degenerative changes. Review of the MIP images confirms the above findings. IMPRESSION: 1. No pulmonary emboli.  2. Dense left lower lobe consolidation with air bronchograms, compatible with severe pneumonia or consolidative atelectasis. 3. Minimal bilateral pleural fluid with minimal diffuse bilateral ground glass opacity, compatible with pulmonary edema. 4. Stable mild cardiomegaly. 5. Prominent central pulmonary arteries,  suggesting pulmonary arterial hypertension. 6. Anasarca with a small amount of ascites, increased perinephric fluid bilaterally and diffuse subcutaneous edema. 7.  Calcific coronary artery and aortic atherosclerosis. 8. Extensive bilateral emphysema. 9. Mildly enlarged prostate. Aortic Atherosclerosis (ICD10-I70.0) and Emphysema (ICD10-J43.9). Electronically Signed   By: Catherin Closs M.D.   On: 06/29/2023 16:46   CT ABDOMEN PELVIS W CONTRAST Result Date: 06/29/2023 CLINICAL DATA:  Respiratory distress. Acute, non localized abdominal pain. Previous right lower lobe resection. EXAM: CT ANGIOGRAPHY CHEST CT ABDOMEN AND PELVIS WITH CONTRAST TECHNIQUE: Multidetector CT imaging of the chest was performed using the standard protocol during bolus administration of intravenous contrast. Multiplanar CT image reconstructions and MIPs were obtained to evaluate the vascular anatomy. Multidetector CT imaging of the abdomen and pelvis was performed using the standard protocol during bolus administration of intravenous contrast. RADIATION DOSE REDUCTION: This exam was performed according to the departmental dose-optimization program which includes automated exposure control, adjustment of the mA and/or kV according to patient size and/or use of iterative reconstruction technique. CONTRAST:  75mL OMNIPAQUE  IOHEXOL  350 MG/ML SOLN COMPARISON:  Portable chest obtained earlier today. Chest CTA dated 11/22/2022. PET-CT dated 04/18/2022. Abdomen and pelvis CT dated 06/01/2009. FINDINGS: CTA CHEST FINDINGS Cardiovascular: Normally opacified pulmonary arteries with no pulmonary arterial filling defects seen. Surgically absent  right lower lobe pulmonary arteries. Prominent central pulmonary arteries with a main pulmonary artery diameter of 3.1 cm. Stable mildly enlarged heart. Minimal pericardial effusion with improvement. No significant residual pericardial fluid. Atheromatous calcifications, including the coronary arteries and aorta. Mediastinum/Nodes: No enlarged mediastinal, hilar, or axillary lymph nodes. Thyroid  gland, trachea, and esophagus demonstrate no significant findings. Lungs/Pleura: Dense left lower lobe consolidation with air bronchograms. Stable bilateral centrilobular and paraseptal bullous emphysema. Associated minimal diffuse bilateral ground glass opacity. Minimal bilateral pleural fluid. Mild right lower lobe atelectasis. Stable left lower lobe calcified granuloma. No lung nodules currently visualized. Musculoskeletal: Marked thoracic and moderate lower cervical spine degenerative changes. Review of the MIP images confirms the above findings. CT ABDOMEN and PELVIS FINDINGS Hepatobiliary: No focal liver abnormality is seen. No gallstones, gallbladder wall thickening, or biliary dilatation. Pancreas: Unremarkable. No pancreatic ductal dilatation or surrounding inflammatory changes. Spleen: Multiple calcified splenic granulomata. Adrenals/Urinary Tract: Normal-appearing adrenal glands. Bilateral simple appearing renal cysts, not needing imaging follow-up. Mild increase in bilateral perinephric soft tissue stranding and fluid. Unremarkable ureters and urinary bladder. No urinary tract calculi or hydronephrosis seen. Stomach/Bowel: Stomach is within normal limits. Appendix appears normal. No evidence of bowel wall thickening, distention, or inflammatory changes. Vascular/Lymphatic: Atheromatous arterial calcifications without aneurysm. No enlarged lymph nodes. Reproductive: Mildly enlarged prostate. Other: Small amount of free peritoneal fluid. Extensive bilateral subcutaneous edema. Musculoskeletal: Lumbar and lower  thoracic spine degenerative changes. Review of the MIP images confirms the above findings. IMPRESSION: 1. No pulmonary emboli. 2. Dense left lower lobe consolidation with air bronchograms, compatible with severe pneumonia or consolidative atelectasis. 3. Minimal bilateral pleural fluid with minimal diffuse bilateral ground glass opacity, compatible with pulmonary edema. 4. Stable mild cardiomegaly. 5. Prominent central pulmonary arteries, suggesting pulmonary arterial hypertension. 6. Anasarca with a small amount of ascites, increased perinephric fluid bilaterally and diffuse subcutaneous edema. 7.  Calcific coronary artery and aortic atherosclerosis. 8. Extensive bilateral emphysema. 9. Mildly enlarged prostate. Aortic Atherosclerosis (ICD10-I70.0) and Emphysema (ICD10-J43.9). Electronically Signed   By: Catherin Closs M.D.   On: 06/29/2023 16:46   CT Head Wo Contrast Result Date: 06/29/2023 CLINICAL DATA:  Head trauma, minor (Age >= 65y); Neck trauma (Age >=  65y). Fall yesterday, landing on the right side of the neck. Neck pain. EXAM: CT HEAD WITHOUT CONTRAST CT CERVICAL SPINE WITHOUT CONTRAST TECHNIQUE: Multidetector CT imaging of the head and cervical spine was performed following the standard protocol without intravenous contrast. Multiplanar CT image reconstructions of the cervical spine were also generated. RADIATION DOSE REDUCTION: This exam was performed according to the departmental dose-optimization program which includes automated exposure control, adjustment of the mA and/or kV according to patient size and/or use of iterative reconstruction technique. COMPARISON:  None Available. FINDINGS: CT HEAD FINDINGS Brain: There is no evidence of an acute infarct, intracranial hemorrhage, mass, midline shift, or extra-axial fluid collection. Cerebral volume is normal. The ventricles are normal in size. Vascular: Calcified atherosclerosis at the skull base. No hyperdense vessel. Skull: No fracture or suspicious  lesion. Sinuses/Orbits: Minimal mucosal thickening in the right maxillary sinus. Clear mastoid air cells. Unremarkable orbits. Other: None. CT CERVICAL SPINE FINDINGS Alignment: Mild reversal of the normal cervical lordosis. Grade 1 anterolisthesis of C2 on C3 and C3 on C4. Trace retrolisthesis of C5 on C6. Skull base and vertebrae: No acute fracture or suspicious lesion. Soft tissues and spinal canal: No prevertebral fluid or swelling. No visible canal hematoma. Disc levels: Advanced disc degeneration at C5-6 and moderate degeneration at C3-4 and C4-5. Advanced facet arthrosis on the right at C2-3 and on the left at C3-4. Moderate to severe neural foraminal stenosis at each of these levels. Suspected mild spinal stenosis at C3-4 and C4-5 and potentially moderate spinal stenosis at C5-6. Upper chest: Largely excluded. Please see the separate contemporaneous CTA of the chest for evaluation. Other: Moderate atherosclerotic calcification of the carotid bifurcations. Partially visualized right internal jugular venous catheter. IMPRESSION: 1. No evidence of acute intracranial abnormality or cervical spine fracture. 2. Cervical disc and facet degeneration as above. Electronically Signed   By: Aundra Lee M.D.   On: 06/29/2023 16:14   CT Cervical Spine Wo Contrast Result Date: 06/29/2023 CLINICAL DATA:  Head trauma, minor (Age >= 65y); Neck trauma (Age >= 65y). Fall yesterday, landing on the right side of the neck. Neck pain. EXAM: CT HEAD WITHOUT CONTRAST CT CERVICAL SPINE WITHOUT CONTRAST TECHNIQUE: Multidetector CT imaging of the head and cervical spine was performed following the standard protocol without intravenous contrast. Multiplanar CT image reconstructions of the cervical spine were also generated. RADIATION DOSE REDUCTION: This exam was performed according to the departmental dose-optimization program which includes automated exposure control, adjustment of the mA and/or kV according to patient size and/or  use of iterative reconstruction technique. COMPARISON:  None Available. FINDINGS: CT HEAD FINDINGS Brain: There is no evidence of an acute infarct, intracranial hemorrhage, mass, midline shift, or extra-axial fluid collection. Cerebral volume is normal. The ventricles are normal in size. Vascular: Calcified atherosclerosis at the skull base. No hyperdense vessel. Skull: No fracture or suspicious lesion. Sinuses/Orbits: Minimal mucosal thickening in the right maxillary sinus. Clear mastoid air cells. Unremarkable orbits. Other: None. CT CERVICAL SPINE FINDINGS Alignment: Mild reversal of the normal cervical lordosis. Grade 1 anterolisthesis of C2 on C3 and C3 on C4. Trace retrolisthesis of C5 on C6. Skull base and vertebrae: No acute fracture or suspicious lesion. Soft tissues and spinal canal: No prevertebral fluid or swelling. No visible canal hematoma. Disc levels: Advanced disc degeneration at C5-6 and moderate degeneration at C3-4 and C4-5. Advanced facet arthrosis on the right at C2-3 and on the left at C3-4. Moderate to severe neural foraminal stenosis at each of these levels.  Suspected mild spinal stenosis at C3-4 and C4-5 and potentially moderate spinal stenosis at C5-6. Upper chest: Largely excluded. Please see the separate contemporaneous CTA of the chest for evaluation. Other: Moderate atherosclerotic calcification of the carotid bifurcations. Partially visualized right internal jugular venous catheter. IMPRESSION: 1. No evidence of acute intracranial abnormality or cervical spine fracture. 2. Cervical disc and facet degeneration as above. Electronically Signed   By: Aundra Lee M.D.   On: 06/29/2023 16:14   DG Chest Portable 1 View Result Date: 06/29/2023 CLINICAL DATA:  Increased work of breathing, chest injury EXAM: PORTABLE CHEST 1 VIEW COMPARISON:  06/27/2023 FINDINGS: Single frontal view of the chest demonstrates stable right internal jugular dialysis catheter. Cardiac silhouette is stable. New  veiling opacity at the left lung base consistent with left lower lobe consolidation and/or effusion. No pneumothorax. Chronic elevation the right hemidiaphragm. IMPRESSION: 1. New left basilar veiling opacity consistent with left lower lobe consolidation and/or effusion. Electronically Signed   By: Bobbye Burrow M.D.   On: 06/29/2023 13:34   EKG: Independently reviewed.  Sinus rhythm at 93 bpm.  Nonspecific T wave changes.  Low voltage multiple leads.  Assessment/Plan Principal Problem:   Acute respiratory failure with hypoxia (HCC) Active Problems:   Acquired hypothyroidism   Essential hypertension   Chronic pain syndrome   History of lung cancer   Thoracic aortic aneurysm 40 mm very of CT from 2024   Thrombocytopenia with normocytic iron  deficiency anemia   Stage 4 chronic kidney disease (HCC)   Acute respite failure with hypoxia Pneumonia Volume overload > Patient presenting with respiratory distress. > Had 2 days of shortness of breath and then sudden became more short of breath during dialysis session today which was only partially completed. > Required CPAP en route via EMS and was placed on BiPAP in the ED. > Chest x-ray showed concern for basilar opacity and CTA confirmed left lower lobe consolidation with air bronchograms consistent with severe pneumonia versus consolidative atelectasis.  Worsened leukopenia with WBC 1.5 ANC 500. > Negative for flu COVID and RSV in the ED. > Primary driver of respiratory failure is likely pneumonia but a component of volume overload based on mild pulmonary edema and partial HD session and lower extremity edema. - Monitor in progressive unit overnight - Continue with BiPAP, wean as tolerated, n.p.o. until off BiPAP for an hour - Continue with ceftriaxone and azithromycin  - Trend fever curve and WBC - Procalcitonin - Strep and Legionella urinary antigens - Dialysis for volume removal - Supportive care  ESRD Progressive glomerulonephritis >  Thought to been triggered by chemotherapy.  On steroids > Newly ESRD, first dialysis session today. > EDP consulting nephrology. - Appreciate nephrology recommendations and assistance - Continue prednisone   Pancytopenia > Chronic anemia which is stable.  Worsened leukopenia as above currently 1.5.  Platelets stable at 131. - Trend CBC  Hypertension - Holding amlodipine  and Coreg  given low normal blood pressures in the ED.  Hypothyroidism - Continue home Synthroid   History of lung cancer > Status post resection - Noted  History of pericardial effusion ?History of CHF (noted in outside records) > Last echo in April showed EF 65-70%, normal diastolic function.  Noted that there was no pericardial effusion at that time.  Thoracic aortic aneurysm - Noted   DVT prophylaxis: Heparin  Code Status:   Full Family Communication:  Updated at bedside  Disposition Plan:   Patient is from:  Home  Anticipated DC to:  Home  Anticipated DC date:  2 to 5 days  Anticipated DC barriers: None  Consults called:  Nephrology Admission status:  Inpatient, progressive  Severity of Illness: The appropriate patient status for this patient is INPATIENT. Inpatient status is judged to be reasonable and necessary in order to provide the required intensity of service to ensure the patient's safety. The patient's presenting symptoms, physical exam findings, and initial radiographic and laboratory data in the context of their chronic comorbidities is felt to place them at high risk for further clinical deterioration. Furthermore, it is not anticipated that the patient will be medically stable for discharge from the hospital within 2 midnights of admission.   * I certify that at the point of admission it is my clinical judgment that the patient will require inpatient hospital care spanning beyond 2 midnights from the point of admission due to high intensity of service, high risk for further deterioration and high  frequency of surveillance required.Johnetta Nab MD [facility]  How to contact the [facility] Attending or Consulting provider 7A - 7P or covering provider during after hours 7P -7A, for this patient?   Check the care team in [facility] and look for a) attending/consulting [facility] provider listed and b) the [facility] team listed Log into [email or web address] and use [place]'s universal password to access. If you do not have the password, please contact the hospital operator. Locate the [facility] provider you are looking for under [facility] and page to a number that you can be directly reached. If you still have difficulty reaching the provider, please page the [facility] (Director on Call) for the Hospitalists listed on amion for assistance.  06/29/2023, 6:08 PM

## 2023-06-29 NOTE — Progress Notes (Signed)
 RT called to bedside for respiratory distress. Pt has SOB, Accessory muscle use with an increased in WOB. Pt placed on bipap and seems to be tolerating it well at this time.

## 2023-06-29 NOTE — Progress Notes (Signed)
 RT transported pt on bipap from CT back to ED 15 without any complications.

## 2023-06-29 NOTE — Progress Notes (Signed)
 Responded to page to support pt.  Chaplain provided ministry of presence,emotional and spiritual support as needed.  Chaplain available as needed.  Anton Baton, [place], [facility], Pager [phone]

## 2023-06-29 NOTE — ED Provider Notes (Signed)
 [facility] EMERGENCY DEPARTMENT AT [facility] Provider Note   CSN: [id number] Arrival date & time: 06/29/23  1201     Patient presents with: Respiratory Distress   Walter Wilkerson is a 73 y.o. male.   Patient is a 74 year old male with past medical history of ESRD and recently started on dialysis today, hypertension, COPD, prior lung cancer status post resection presenting to the emergency department with respiratory distress.  Patient reports that he has had increasing shortness of breath for the last 2 days.  He went to his dialysis session today and about 2 hours into the session had acute worsening shortness of breath and EMS was called.  He was cyanotic and hypoxic on EMS arrival.  He was placed on CPAP and route.  Did not have any reported wheezing by EMS but was given, DuoNeb with no significant relief.  Patient is also reported increased swelling in his legs and abdominal distention and reports generalized abdominal discomfort.  He states that he did have nausea and dry heaving this morning and 1 episode of diarrhea.  Patient denies any associated chest pain.  He denies any history of VTE, recent hospitalization was about 2 months ago for his acute on chronic kidney disease.  The history is provided by the patient and the EMS personnel.       Prior to Admission medications   Medication Sig Start Date End Date Taking? Authorizing Provider  albuterol  (VENTOLIN  HFA) 108 (90 Base) MCG/ACT inhaler Inhale 2 puffs into the lungs every 4 (four) hours as needed. Patient taking differently: Inhale 2 puffs into the lungs every 4 (four) hours as needed for shortness of breath or wheezing. 12/08/20   Breeback, Jade L, PA-C  amLODipine  (NORVASC ) 10 MG tablet Take 10 mg by mouth daily. 05/16/23   [provider]  aspirin EC 81 MG tablet Take 81 mg by mouth daily.    [provider]  atorvastatin  (LIPITOR) 40 MG tablet Take 1 tablet (40 mg total) by mouth daily. 11/29/22    Breeback, Jade L, PA-C  carvedilol  (COREG ) 6.25 MG tablet Take 1 tablet (6.25 mg total) by mouth 2 (two) times daily with a meal. 06/13/23 08/12/23  Breeback, Jade L, PA-C  clindamycin (CLEOCIN T) 1 % external solution Apply 1 Application topically 2 (two) times daily as needed (for scalp irritation).    [provider]  clobetasol cream (TEMOVATE) 0.05 % Apply 1 Application topically 2 (two) times daily as needed (for scalp irritation).    [provider]  Continuous Glucose Sensor (FREESTYLE LIBRE 3 SENSOR) MISC 1 each by Does not apply route every 14 (fourteen) days. Please apply for 14 days and then switch to new sensor 06/02/23   Breeback, Jade L, PA-C  cyanocobalamin  (VITAMIN B12) 1000 MCG tablet Take 1,000 mcg by mouth daily.    [provider]  DULoxetine  (CYMBALTA ) 30 MG capsule Take 1 capsule (30 mg total) by mouth daily. 06/07/23   Breeback, Jade L, PA-C  Fluticasone -Umeclidin-Vilant (TRELEGY ELLIPTA ) 100-62.5-25 MCG/ACT AEPB Inhale 1 puff into the lungs daily in the afternoon. Patient taking differently: Inhale 1 puff into the lungs daily as needed (Shortness of breath). 04/05/22   Wilder Handy, MD  Glucagon  (GVOKE HYPOPEN  2-PACK) 1 MG/0.2ML SOAJ Inject 1mg  pen into outer thigh or lower abdomen if blood sugar less than 54. 05/19/23   Breeback, Jade L, PA-C  insulin  glargine, 1 Unit Dial, (TOUJEO  SOLOSTAR) 300 UNIT/ML Solostar Pen Increase by 2 units every 5 days  until fasting sugars 90-120. Max daily dosage 20 units Patient taking differently: Inject 2-3 Units into the skin See admin instructions. Take 2 units in the morning and 3 units at bedtime Max daily dosage 20 units 05/25/23   Breeback, Jade L, PA-C  levothyroxine  (SYNTHROID ) 175 MCG tablet Take 1 tablet (175 mcg total) by mouth daily before breakfast. Patient taking differently: Take 150 mcg by mouth daily before breakfast. 06/13/23   Breeback, Jade L, PA-C  Melatonin 10 MG TABS Take 10 mg by mouth at bedtime.     [provider]  pantoprazole  (PROTONIX ) 40 MG tablet Take 1 tablet (40 mg total) by mouth daily. 05/11/23 07/10/23  Etter Hermann., MD  predniSONE  (DELTASONE ) 20 MG tablet Take 20 mg by mouth daily with breakfast.    [provider]  sodium bicarbonate  650 MG tablet Take 1 tablet (650 mg total) by mouth 3 (three) times daily. Patient taking differently: Take 1,300 mg by mouth 3 (three) times daily. 05/11/23 07/10/23  Etter Hermann., MD  Vitamin D , Ergocalciferol , (DRISDOL ) 1.25 MG (50000 UNIT) CAPS capsule Take 1 capsule (50,000 Units total) by mouth every 7 (seven) days. Patient not taking: Reported on 06/26/2023 05/15/23   Etter Hermann., MD    Allergies: Pregabalin, Lisinopril, Merbromin, Thimerosal (thiomersal), and Ferrous sulfate     Review of Systems  Updated Vital Signs BP 94/61   Pulse 93   Temp 99.2 F (37.3 C) (Axillary)   Resp 18   Ht 5' 8 (1.727 m)   Wt 78.5 kg   SpO2 99%   BMI 26.30 kg/m   Physical Exam Vitals and nursing note reviewed.  Constitutional:      General: He is in acute distress.     Appearance: Normal appearance. He is ill-appearing.  HENT:     Head: Normocephalic.     Nose: Nose normal.     Mouth/Throat:     Mouth: Mucous membranes are moist.   Eyes:     Extraocular Movements: Extraocular movements intact.     Conjunctiva/sclera: Conjunctivae normal.    Cardiovascular:     Rate and Rhythm: Normal rate and regular rhythm.     Heart sounds: Normal heart sounds.  Pulmonary:     Breath sounds: Normal breath sounds.     Comments: Tachypneic, speaking in 3-4 word sentences, mild retractions, on bipap Abdominal:     General: Abdomen is flat.     Palpations: Abdomen is soft.     Tenderness: There is abdominal tenderness (generalized). There is no guarding or rebound.   Musculoskeletal:        General: Normal range of motion.     Cervical back: Normal range of motion.     Right lower leg: Edema (1+ to mid  shin) present.     Left lower leg: Edema (1+ to mid shin) present.   Skin:    General: Skin is warm and dry.   Neurological:     General: No focal deficit present.     Mental Status: He is alert and oriented to person, place, and time.   Psychiatric:        Mood and Affect: Mood normal.        Behavior: Behavior normal.     (all labs ordered are listed, but only abnormal results are displayed) Labs Reviewed  CBC WITH DIFFERENTIAL/PLATELET - Abnormal; Notable for the following components:      Result Value   WBC 1.5 (*)  RBC 2.89 (*)    Hemoglobin 8.8 (*)    HCT 26.6 (*)    Platelets 131 (*)    nRBC 1.3 (*)    All other components within normal limits  COMPREHENSIVE METABOLIC PANEL WITH GFR - Abnormal; Notable for the following components:   BUN 83 (*)    Creatinine, Ser 4.87 (*)    Calcium  7.1 (*)    Total Protein 4.8 (*)    Albumin  2.2 (*)    Alkaline Phosphatase 154 (*)    GFR, Estimated 12 (*)    All other components within normal limits  I-STAT VENOUS BLOOD GAS, ED - Abnormal; Notable for the following components:   Acid-base deficit 5.0 (*)    Calcium , Ion 1.03 (*)    HCT 25.0 (*)    Hemoglobin 8.5 (*)    All other components within normal limits  I-STAT CG4 LACTIC ACID, ED - Abnormal; Notable for the following components:   Lactic Acid, Venous 2.8 (*)    All other components within normal limits  I-STAT CHEM 8, ED - Abnormal; Notable for the following components:   BUN 84 (*)    Creatinine, Ser 4.70 (*)    Calcium , Ion 1.02 (*)    TCO2 21 (*)    Hemoglobin 8.5 (*)    HCT 25.0 (*)    All other components within normal limits  TROPONIN I (HIGH SENSITIVITY) - Abnormal; Notable for the following components:   Troponin I (High Sensitivity) 116 (*)    All other components within normal limits  RESP PANEL BY RT-PCR (RSV, FLU A&B, COVID)  RVPGX2  LIPASE, BLOOD  BRAIN NATRIURETIC PEPTIDE  I-STAT CG4 LACTIC ACID, ED  TROPONIN I (HIGH SENSITIVITY)     EKG: EKG Interpretation Date/Time:  Thursday June 29 2023 13:44:57 EDT Ventricular Rate:  93 PR Interval:  180 QRS Duration:  84 QT Interval:  315 QTC Calculation: 392 R Axis:   74  Text Interpretation: Sinus rhythm No significant change since last tracing Confirmed by Celesta Coke ([id number]) on 06/29/2023 1:47:56 PM  Radiology: Lenell Query Chest Portable 1 View Result Date: 06/29/2023 CLINICAL DATA:  Increased work of breathing, chest injury EXAM: PORTABLE CHEST 1 VIEW COMPARISON:  06/27/2023 FINDINGS: Single frontal view of the chest demonstrates stable right internal jugular dialysis catheter. Cardiac silhouette is stable. New veiling opacity at the left lung base consistent with left lower lobe consolidation and/or effusion. No pneumothorax. Chronic elevation the right hemidiaphragm. IMPRESSION: 1. New left basilar veiling opacity consistent with left lower lobe consolidation and/or effusion. Electronically Signed   By: Bobbye Burrow M.D.   On: 06/29/2023 13:34     .Critical Care  Performed by: Kingsley, Dalylah Ramey K, DO Authorized by: Nolberto Batty, DO   Critical care provider statement:    Critical care time (minutes):  30   Critical care was necessary to treat or prevent imminent or life-threatening deterioration of the following conditions:  Respiratory failure   Critical care was time spent personally by me on the following activities:  Development of treatment plan with patient or surrogate, discussions with consultants, evaluation of patient's response to treatment, examination of patient, ordering and review of laboratory studies, ordering and review of radiographic studies, ordering and performing treatments and interventions, pulse oximetry, re-evaluation of patient's condition and review of old charts    Medications Ordered in the ED  ipratropium-albuterol  (DUONEB) 0.5-2.5 (3) MG/3ML nebulizer solution 3 mL (3 mLs Nebulization Given 06/29/23 1218)  hydrocortisone sodium  succinate (SOLU-CORTEF)  100 MG injection 100 mg (100 mg Intravenous Given 06/29/23 1324)  sodium chloride  0.9 % bolus 500 mL (0 mLs Intravenous Stopped 06/29/23 1456)  aspirin chewable tablet 324 mg (324 mg Oral Given 06/29/23 1354)    Clinical Course as of 06/29/23 1523  Thu Jun 29, 2023  1253 Patient reports he did fall yesterday after moving a plant, no presyncopal symptoms. States he landed on the R side of his neck and has been having neck pain. Denies hitting his head. Will add on CTH/C-spine. Patient denies any rib pain. Also reports being on chronic steroids, plan today was to decrease steroids and stop BP meds however with his work of breathing was sent to the ED before medication changes. BP still low here in 80s to 90s and will be given stress dose steroids. [VK]  1341 LLQ opacity vs consolidation on CXR. CTPE study pending to evaluate for  [VK]  1342 Trop 116, EKG pending. Will be given aspirin [VK]  1347 No acute ischemic changes on EKG. [VK]  1426 Acute leukopenia, Hgb approximately at baseline. [VK]  1522 Patient signed out to Dr. Moses Arenas pending CT imaging with plan for admission. [VK]    Clinical Course User Index [VK] Kingsley, Raniah Karan K, DO                                 Medical Decision Making This patient presents to the ED with chief complaint(s) of SOB with pertinent past medical history of ESRD, HTN, COPD, hypothyroidism which further complicates the presenting complaint. The complaint involves an extensive differential diagnosis and also carries with it a high risk of complications and morbidity.    The differential diagnosis includes patient has no significant wheezing on exam making COPD exacerbation unlikely, considering arrhythmia, anemia, pneumonia, pneumothorax, pulmonary edema, pleural effusion, considering PE with his hypoxia and recent admission, metabolic derangement  Additional history obtained: Additional history obtained from EMS  Records reviewed recent  admission records, primary care records, recent vascular records  ED Course and Reassessment: On patient's arrival he was tachypneic and hypoxic and transition from CPAP onto BiPAP.  Blood pressures were soft in the 90s on arrival.  Patient's lungs sound clear to auscultation that he is tachypneic with increased work of breathing.  He does appear mildly volume overloaded with some pitting edema in the legs.  Patient will have labs, EKG chest x-ray performed.  Likely will need CT PE study.  Will be closely reassessed.  Independent labs interpretation:  The following labs were independently interpreted: elevated troponin, leukopenia  Independent visualization of imaging: - I independently visualized the following imaging with scope of interpretation limited to determining acute life threatening conditions related to emergency care: CXR, which revealed LLL consolidation vs infiltrate   Amount and/or Complexity of Data Reviewed Labs: ordered. Radiology: ordered.  Risk OTC drugs. Prescription drug management.       Final diagnoses:  None    ED Discharge Orders     None          Nolberto Batty, [place] 06/29/23 1523

## 2023-06-29 NOTE — ED Notes (Signed)
 Patient taken off Bi-Pap. Initially dropped to 86%, but was able to slow his breathing using a nasal cannula and maintain at 92%.

## 2023-06-30 DIAGNOSIS — J9601 Acute respiratory failure with hypoxia: Secondary | ICD-10-CM | POA: Diagnosis not present

## 2023-06-30 LAB — CBC
HCT: 22.7 % — ABNORMAL LOW (ref 39.0–52.0)
Hemoglobin: 7.7 g/dL — ABNORMAL LOW (ref 13.0–17.0)
MCH: 30.6 pg (ref 26.0–34.0)
MCHC: 33.9 g/dL (ref 30.0–36.0)
MCV: 90.1 fL (ref 80.0–100.0)
Platelets: 86 10*3/uL — ABNORMAL LOW (ref 150–400)
RBC: 2.52 MIL/uL — ABNORMAL LOW (ref 4.22–5.81)
RDW: 15.3 % (ref 11.5–15.5)
WBC: 9.3 10*3/uL (ref 4.0–10.5)
nRBC: 0 % (ref 0.0–0.2)

## 2023-06-30 LAB — GLUCOSE, CAPILLARY
Glucose-Capillary: 100 mg/dL — ABNORMAL HIGH (ref 70–99)
Glucose-Capillary: 127 mg/dL — ABNORMAL HIGH (ref 70–99)
Glucose-Capillary: 132 mg/dL — ABNORMAL HIGH (ref 70–99)
Glucose-Capillary: 146 mg/dL — ABNORMAL HIGH (ref 70–99)
Glucose-Capillary: 49 mg/dL — ABNORMAL LOW (ref 70–99)
Glucose-Capillary: 76 mg/dL (ref 70–99)

## 2023-06-30 LAB — RENAL FUNCTION PANEL
Albumin: 2.3 g/dL — ABNORMAL LOW (ref 3.5–5.0)
Anion gap: 13 (ref 5–15)
BUN: 45 mg/dL — ABNORMAL HIGH (ref 8–23)
CO2: 23 mmol/L (ref 22–32)
Calcium: 7.7 mg/dL — ABNORMAL LOW (ref 8.9–10.3)
Chloride: 99 mmol/L (ref 98–111)
Creatinine, Ser: 3.18 mg/dL — ABNORMAL HIGH (ref 0.61–1.24)
GFR, Estimated: 20 mL/min — ABNORMAL LOW (ref >60)
Glucose, Bld: 119 mg/dL — ABNORMAL HIGH (ref 70–99)
Phosphorus: 4.4 mg/dL (ref 2.5–4.6)
Potassium: 4.3 mmol/L (ref 3.5–5.1)
Sodium: 135 mmol/L (ref 135–145)

## 2023-06-30 LAB — CBG MONITORING, ED
Glucose-Capillary: 65 mg/dL — ABNORMAL LOW (ref 70–99)
Glucose-Capillary: 86 mg/dL (ref 70–99)

## 2023-06-30 LAB — PROCALCITONIN: Procalcitonin: 101.91 ng/mL

## 2023-06-30 LAB — PATHOLOGIST SMEAR REVIEW

## 2023-06-30 LAB — HEPATITIS B SURFACE ANTIGEN: Hepatitis B Surface Ag: NONREACTIVE

## 2023-06-30 MED ORDER — ALBUMIN HUMAN 25 % IV SOLN
25.0000 g | INTRAVENOUS | Status: AC
  Administered 2023-06-30: 25 g via INTRAVENOUS
  Filled 2023-06-30: qty 50

## 2023-06-30 MED ORDER — TRAZODONE HCL 50 MG PO TABS
25.0000 mg | ORAL_TABLET | Freq: Once | ORAL | Status: AC
  Administered 2023-06-30: 25 mg via ORAL
  Filled 2023-06-30: qty 1

## 2023-06-30 MED ORDER — HEPARIN SODIUM (PORCINE) 1000 UNIT/ML DIALYSIS
1000.0000 [IU] | INTRAMUSCULAR | Status: DC | PRN
  Administered 2023-06-30: 3200 [IU]
  Filled 2023-06-30: qty 1

## 2023-06-30 MED ORDER — HEPARIN SODIUM (PORCINE) 1000 UNIT/ML IJ SOLN
INTRAMUSCULAR | Status: AC
Start: 2023-06-30 — End: 2023-06-30
  Filled 2023-06-30: qty 4

## 2023-06-30 MED ORDER — ALBUMIN HUMAN 25 % IV SOLN: 25.0000 g | INTRAVENOUS | Status: AC

## 2023-06-30 MED ORDER — LIDOCAINE HCL (PF) 1 % IJ SOLN: 5.0000 mL | INTRAMUSCULAR | Status: DC | PRN

## 2023-06-30 MED ORDER — ALTEPLASE 2 MG IJ SOLR: 2.0000 mg | Freq: Once | INTRAMUSCULAR | Status: DC | PRN

## 2023-06-30 MED ORDER — FUROSEMIDE 10 MG/ML IJ SOLN
120.0000 mg | Freq: Once | INTRAVENOUS | Status: AC
  Administered 2023-06-30: 120 mg via INTRAVENOUS
  Filled 2023-06-30: qty 2

## 2023-06-30 MED ORDER — LIDOCAINE-PRILOCAINE 2.5-2.5 % EX CREA: 1.0000 | TOPICAL_CREAM | CUTANEOUS | Status: DC | PRN

## 2023-06-30 MED ORDER — PENTAFLUOROPROP-TETRAFLUOROETH EX AERO: 1.0000 | INHALATION_SPRAY | CUTANEOUS | Status: DC | PRN

## 2023-06-30 MED ORDER — DEXTROSE 50 % IV SOLN
50.0000 mL | Freq: Once | INTRAVENOUS | Status: AC
  Administered 2023-06-30: 50 mL via INTRAVENOUS
  Filled 2023-06-30: qty 50

## 2023-06-30 MED ORDER — MIDODRINE HCL 5 MG PO TABS
10.0000 mg | ORAL_TABLET | ORAL | Status: AC
  Administered 2023-06-30: 10 mg via ORAL
  Filled 2023-06-30: qty 2

## 2023-06-30 MED ORDER — IPRATROPIUM-ALBUTEROL 0.5-2.5 (3) MG/3ML IN SOLN: 3.0000 mL | Freq: Four times a day (QID) | RESPIRATORY_TRACT | Status: DC | PRN

## 2023-06-30 MED ORDER — MIDODRINE HCL 5 MG PO TABS
10.0000 mg | ORAL_TABLET | Freq: Three times a day (TID) | ORAL | Status: DC
  Administered 2023-06-30 – 2023-07-02 (×5): 10 mg via ORAL
  Filled 2023-06-30 (×5): qty 2

## 2023-06-30 MED ORDER — ANTICOAGULANT SODIUM CITRATE 4% (200MG/5ML) IV SOLN
5.0000 mL | Status: DC | PRN
Start: 2023-06-30 — End: 2023-06-30

## 2023-06-30 NOTE — Progress Notes (Signed)
   06/30/23 1221  TOC Brief Assessment  Insurance and Status Reviewed  Patient has primary care physician Yes  Home environment has been reviewed home w/ spouse  Prior level of function: self  Prior/Current Home Services No current home services  Social Drivers of Health Review SDOH reviewed no interventions necessary  Readmission risk has been reviewed Yes  Transition of care needs no transition of care needs at this time    We will continue to monitor patient advancement through interdisciplinary progression rounds. If new patient transition needs arise, please place a TOC consult.

## 2023-06-30 NOTE — Plan of Care (Addendum)
 RN reported that patient is complaining about shortness of breath even on BiPAP with O2 sat 97% .  patient is hemodynamically stable blood pressure has been improved.  physical exam showed right-sided lung field crackle and rhonchi. Patient being admitted for volume overload at the same time patient has pneumonia. -Nephrology has been consulted earlier tonight unable to dialyze patient given patient had soft blood pressure.  Given patient continues to complaining about shortness of breath giving another dose of nebulizer and spoke with on-call nephrology Dr. Austine Blunt who recommended to give midodrine and albumin  in order to pursue dialysis tonight. -Continue BiPAP.  Continue DuoNeb nebulizer as needed. - Plan for dialysis per nephrology team.   Odean Fester, MD [facility] 06/30/2023, 2:29 AM

## 2023-06-30 NOTE — Progress Notes (Signed)
 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 Rituximan #2 05/30/23. pANCA titer 1:320 5/6 with UPC 300mg /g.  Decision not to offer more Rituximab  given complications with steroid cream jitteriness, fungal infection, puffy face and hyperglycemia requiring insulin  as well as BUN/Cr >112/7.4 on 6/4. Prednisone  was lowered to 20 mg and Bactrim  discontinued.  4.  Acute hypoxic RF: pulm edema    Subjective:    Seen in room.  Off HD at around 0700 this AM with 3.5L off.  Requiring BiPap now, getting 120 mg IV Lasix    Objective:   BP 97/71 (BP Location: Left Arm)   Pulse 96   Temp 97.9 F (36.6 C) (Oral)   Resp (!) 29   Ht 5' 8 (1.727 m)   Wt 78.5 kg   SpO2 (!) 86%   BMI 26.30 kg/m   Physical Exam: [id number] on BiPaP CVS: RRR Resp: coarse crackles bilaterally Abd: soft Ext: 2+ LE edema ACCESS: R internal jugular TDC  Labs: BMET Recent Labs  Lab 06/27/23 0626 06/29/23 1220 06/29/23 1225  NA 138 136 136  136  K 3.8 3.9 3.9  3.9  CL 106 103 101  CO2  --  22  --   GLUCOSE 99 96 90  BUN 110* 83* 84*  CREATININE 6.80* 4.87* 4.70*  CALCIUM   --  7.1*  --    CBC Recent Labs  Lab 06/27/23 0626 06/29/23 1220 06/29/23 1225  WBC  --  1.5*  --   NEUTROABS  --  0.5*  --   HGB 7.5* 8.8* 8.5*  8.5*  HCT 22.0* 26.6* 25.0*  25.0*  MCV  --   92.0  --   PLT  --  131*  --       Medications:     atorvastatin   40 mg Oral Daily   budesonide -glycopyrrolate -formoterol   2 puff Inhalation BID   Chlorhexidine Gluconate Cloth  6 each Topical Q0600   heparin   5,000 Units Subcutaneous Q8H   levothyroxine   175 mcg Oral QAC breakfast   melatonin  10 mg Oral QHS   midodrine  10 mg Oral Q8H   pantoprazole   40 mg Oral Daily   predniSONE   20 mg Oral Q breakfast   sodium chloride  flush  3 mL Intravenous Q12H     Leandra Pro MD 06/30/2023, 11:44 AM

## 2023-06-30 NOTE — Progress Notes (Signed)
 Notified by NT CBG 49-hypoglycemic protocol ordered.  4oz apple juice administered, repeat CBG 79.

## 2023-06-30 NOTE — Procedures (Signed)
 Received patient in bed to unit.  Alert and oriented.  Informed consent signed and in chart.   TX duration: 3.5 hours  Patient tolerated well.  Transported back to the room  Alert, without acute distress.  Hand-off given to patient's nurse.   Access used: right cath Access issues: none  Total UF removed: 3.5 liters   Clover Dao, RN [facility]

## 2023-06-30 NOTE — Progress Notes (Signed)
   06/30/23 2020  BiPAP/CPAP/SIPAP  $ Non-Invasive Ventilator  Non-Invasive Vent Subsequent  BiPAP/CPAP/SIPAP Pt Type Adult  BiPAP/CPAP/SIPAP SERVO  Mask Type Full face mask  Mask Size Large  Set Rate 15 breaths/min  Respiratory Rate 22 breaths/min  IPAP 13 cmH20  EPAP 5 cmH2O  PEEP 5 cmH20  FiO2 (%) 60 %  Minute Ventilation 17.7  Leak 80  Peak Inspiratory Pressure (PIP) 15  Tidal Volume (Vt) 608  Patient Home Machine No  Patient Home Mask No  Patient Home Tubing No  Auto Titrate No  Device Plugged into RED Power Outlet Yes  BiPAP/CPAP /SiPAP Vitals  Pulse Rate 78  Resp (!) 25  SpO2 99 %  Bilateral Breath Sounds Clear;Diminished

## 2023-06-30 NOTE — Progress Notes (Signed)
 PROGRESS NOTE  Isabel Freese GNF:[id number] DOB: 1950-05-24 DOA: 06/29/2023 PCP: Araceli Knight, PA-C   LOS: 1 day   Brief Narrative / Interim history: 73 year old male with HTN, hypothyroidism, colon cancer s/p resection, anemia, thrombocytopenia, thoracic aortic aneurysm, newly diagnosed ESRD just darted on dialysis comes into the hospital with shortness of breath during his first dialysis episode.  He tells me he has noticed fluid overload in his legs, and increased shortness of breath even before starting HD.  Upon presentation in the HD unit, while having dialysis, has complained of persistent shortness of breath and EMS was called and the session was stopped.  He was hypoxic and brought to the hospital.  He was initially placed on BiPAP.  Imaging showed evidence of fluid overload and possible left lower lobe pneumonia versus consolidative atelectasis.  Nephrology was consulted and he was admitted to the hospital and emergently placed on HD  Subjective / 24h Interval events: Seen twice this morning, once in dialysis then later on on the floor once HD was done.  Complains of significant shortness of breath still, but improved from when he was at home.  He denies any chest pain, denies any fevers or chills.  He does complain of a cough with white sputum  Assesement and Plan: Principal Problem:   Acute respiratory failure with hypoxia (HCC) Active Problems:   Acquired hypothyroidism   Essential hypertension   Chronic pain syndrome   History of lung cancer   Thoracic aortic aneurysm 40 mm very of CT from 2024   Thrombocytopenia with normocytic iron  deficiency anemia   Stage 4 chronic kidney disease (HCC)  Principal problem Acute hypoxemic respiratory failure in the setting of acute pulmonary edema/volume overload in the setting of ESRD -patient just started on dialysis, imaging on admission showed significant fluid overload with crackles on exam, tachypnea and increased work of  breathing. - Just had 3.5 L removed with dialysis but remains significantly tachypneic.  Will discuss with nephrology if Lasix  has a role given that he is making some urine.  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  He is on steroids as an outpatient, continue.  Nephrology consulted.  Dialysis/Lasix  as above  Possible left lower lobe CAP -CT scan on admission with dense left lower lobe consolidation with air bronchograms, severe pneumonia versus consolidative atelectasis.  He did have a cough but no fevers.  Once respiratory status improves and he looks more euvolemic, we will repeat a chest x-ray and discontinue antibiotics if pneumonia resolves with fluid removal  Hypotension -requiring midodrine prior to dialysis, but tolerating dialysis well for now.  Pancytopenia-overall stable, continue to monitor  Essential hypertension-hold home antihypertensives given hypotension  Hypothyroidism-continue home Synthroid   History of lung cancer-status post resection   Scheduled Meds:  atorvastatin   40 mg Oral Daily   budesonide -glycopyrrolate -formoterol   2 puff Inhalation BID   Chlorhexidine Gluconate Cloth  6 each Topical Q0600   heparin   5,000 Units Subcutaneous Q8H   insulin  aspart  0-6 Units Subcutaneous Q4H   levothyroxine   175 mcg Oral QAC breakfast   melatonin  10 mg Oral QHS   pantoprazole   40 mg Oral Daily   predniSONE   20 mg Oral Q breakfast   sodium chloride  flush  3 mL Intravenous Q12H   Continuous Infusions:  albumin  human  azithromycin      cefTRIAXone (ROCEPHIN)  IV     PRN Meds:.acetaminophen  **OR** acetaminophen , ipratropium-albuterol , polyethylene glycol  Current Outpatient Medications  Medication Instructions   albuterol  (VENTOLIN  HFA) 108 (90 Base) MCG/ACT inhaler 2 puffs, Inhalation,  Every 4 hours PRN   amLODipine  (NORVASC ) 10 mg, Oral, Every morning   aspirin EC 81 mg, Oral, Daily   atorvastatin  (LIPITOR) 40 mg, Oral, Daily   carvedilol  (COREG ) 6.25 mg, Oral, 2 times daily with meals   clindamycin (CLEOCIN T) 1 % external solution 1 Application, Topical, 2 times daily PRN   clobetasol cream (TEMOVATE) 0.05 % 1 Application, Topical, 2 times daily PRN   Continuous Glucose Sensor (FREESTYLE LIBRE 3 SENSOR) MISC 1 each, Does not apply, Every 14 days, Please apply for 14 days and then switch to new sensor   cyanocobalamin  (VITAMIN B12) 1,000 mcg, Oral, Daily   DULoxetine  (CYMBALTA ) 30 mg, Oral, Daily   Fluticasone -Umeclidin-Vilant (TRELEGY ELLIPTA ) 100-62.5-25 MCG/ACT AEPB 1 puff, Inhalation, Daily   Glucagon  (GVOKE HYPOPEN  2-PACK) 1 MG/0.2ML SOAJ Inject 1mg  pen into outer thigh or lower abdomen if blood sugar less than 54.   insulin  glargine, 1 Unit Dial, (TOUJEO  SOLOSTAR) 300 UNIT/ML Solostar Pen Increase by 2 units every 5 days until fasting sugars 90-120. Max daily dosage 20 units   levothyroxine  (SYNTHROID ) 175 mcg, Oral, Daily before breakfast   Melatonin 10 mg, Oral, Daily at bedtime   pantoprazole  (PROTONIX ) 40 mg, Oral, Daily   predniSONE  (DELTASONE ) 20 mg, Oral, Daily with breakfast   sodium bicarbonate  650 mg, Oral, 3 times daily   Vitamin D  (Ergocalciferol ) (DRISDOL ) 50,000 Units, Oral, Every 7 days    Diet Orders (From admission, onward)     Start     Ordered   06/29/23 1805  Diet NPO time specified  Diet effective now        06/29/23 1807            DVT prophylaxis: heparin  injection 5,000 Units Start: 06/29/23 2200   Lab Results  Component Value Date   PLT 131 (L) 06/29/2023      Code Status: Full Code  Family Communication: No family at bedside  Status is: Inpatient Remains inpatient appropriate because: Severity of illness   Level of care: Progressive  Consultants:  Nephrology  Objective: Vitals:   06/30/23 0730 06/30/23 0745  06/30/23 0757 06/30/23 0857  BP: (!) 105/59 120/61 103/63 97/71  Pulse: 90 87 92 96  Resp: (!) 21 17 (!) 23 (!) 22  Temp:   98.2 F (36.8 C) 97.9 F (36.6 C)  TempSrc:    Oral  SpO2: 99% 99% 97% (!) 86%  Weight:      Height:        Intake/Output Summary (Last 24 hours) at 06/30/2023 0930 Last data filed at 06/30/2023 0757 Gross per 24 hour  Intake 600 ml  Output 3900 ml  Net -3300 ml   Wt Readings from Last 3 Encounters:  06/29/23 78.5 kg  06/27/23 79.4 kg  06/22/23 79.1 kg    Examination:  Constitutional: Appears in mild distress due to increased work of breathing Eyes: no scleral icterus ENMT: Mucous membranes are moist.  Neck: normal, supple Respiratory: Coarse breath sounds bilaterally, diminished at the bases, tachypneic. Cardiovascular: Regular rate and rhythm, no murmurs / rubs / gallops.  2+ lower extremity edema Abdomen: non distended, no tenderness. Bowel sounds positive.  Musculoskeletal: no clubbing / cyanosis.  Skin: no rashes Neurologic: non focal   Data Reviewed: I  have independently reviewed following labs and imaging studies   CBC Recent Labs  Lab 06/27/23 0626 06/29/23 1220 06/29/23 1225  WBC  --  1.5*  --   HGB 7.5* 8.8* 8.5*  8.5*  HCT 22.0* 26.6* 25.0*  25.0*  PLT  --  131*  --   MCV  --  92.0  --   MCH  --  30.4  --   MCHC  --  33.1  --   RDW  --  15.1  --   LYMPHSABS  --  0.7  --   MONOABS  --  0.1  --   EOSABS  --  0.1  --   BASOSABS  --  0.0  --     Recent Labs  Lab 06/27/23 0626 06/29/23 1220 06/29/23 1225 06/29/23 1503 06/29/23 1749  NA 138 136 136  136  --   --   K 3.8 3.9 3.9  3.9  --   --   CL 106 103 101  --   --   CO2  --  22  --   --   --   GLUCOSE 99 96 90  --   --   BUN 110* 83* 84*  --   --   CREATININE 6.80* 4.87* 4.70*  --   --   CALCIUM   --  7.1*  --   --   --   AST  --  20  --   --   --   ALT  --  9  --   --   --   ALKPHOS  --  154*  --   --   --   BILITOT  --  0.5  --   --   --   ALBUMIN   --   2.2*  --   --   --   LATICACIDVEN  --   --  2.8* 1.8  --   BNP  --   --   --   --  1,277.5*    ------------------------------------------------------------------------------------------------------------------ No results for input(s): CHOL, HDL, LDLCALC, TRIG, CHOLHDL, LDLDIRECT in the last 72 hours.  Lab Results  Component Value Date   HGBA1C 5.7 (H) 05/07/2023   ------------------------------------------------------------------------------------------------------------------ No results for input(s): TSH, T4TOTAL, T3FREE, THYROIDAB in the last 72 hours.  Invalid input(s): FREET3  Cardiac Enzymes No results for input(s): CKMB, TROPONINI, MYOGLOBIN in the last 168 hours.  Invalid input(s): CK ------------------------------------------------------------------------------------------------------------------    Component Value Date/Time   BNP 1,277.5 (H) 06/29/2023 1749    CBG: Recent Labs  Lab 06/27/23 0827 06/29/23 1922 06/29/23 2313 06/30/23 0017 06/30/23 0319  GLUCAP 130* 183* 64* 86 65*    Recent Results (from the past 240 hours)  Resp panel by RT-PCR (RSV, Flu A&B, Covid) Anterior Nasal Swab     Status: None   Collection Time: 06/29/23 12:21 PM   Specimen: Anterior Nasal Swab  Result Value Ref Range Status   SARS Coronavirus 2 by RT PCR NEGATIVE NEGATIVE Final   Influenza A by PCR NEGATIVE NEGATIVE Final   Influenza B by PCR NEGATIVE NEGATIVE Final    Comment: (NOTE) The Xpert Xpress SARS-CoV-2/FLU/RSV plus assay is intended as an aid in the diagnosis of influenza from Nasopharyngeal swab specimens and should not be used as a sole basis for treatment. Nasal washings and aspirates are unacceptable for Xpert Xpress SARS-CoV-2/FLU/RSV testing.  Fact Sheet for Patients: BloggerCourse.com  Fact Sheet for Healthcare Providers: SeriousBroker.it  This test is not yet approved or  cleared  by the [place]  FDA and has been authorized for detection and/or diagnosis of SARS-CoV-2 by FDA under an Emergency Use Authorization (EUA). This EUA will remain in effect (meaning this test can be used) for the duration of the COVID-19 declaration under Section 564(b)(1) of the Act, 21 U.S.C. section 360bbb-3(b)(1), unless the authorization is terminated or revoked.     Resp Syncytial Virus by PCR NEGATIVE NEGATIVE Final    Comment: (NOTE) Fact Sheet for Patients: BloggerCourse.com  Fact Sheet for Healthcare Providers: SeriousBroker.it  This test is not yet approved or cleared by the [place]  FDA and has been authorized for detection and/or diagnosis of SARS-CoV-2 by FDA under an Emergency Use Authorization (EUA). This EUA will remain in effect (meaning this test can be used) for the duration of the COVID-19 declaration under Section 564(b)(1) of the Act, 21 U.S.C. section 360bbb-3(b)(1), unless the authorization is terminated or revoked.  Performed at [facility], [address], [place], [place] [address]   Blood culture (routine x 2)     Status: None (Preliminary result)   Collection Time: 06/29/23  5:47 PM   Specimen: BLOOD RIGHT WRIST  Result Value Ref Range Status   Specimen Description BLOOD RIGHT WRIST  Final   Special Requests   Final    BOTTLES DRAWN AEROBIC AND ANAEROBIC Blood Culture adequate volume   Culture   Final    NO GROWTH < 24 HOURS Performed at [facility], [address], [place], [place] [address]    Report Status PENDING  Incomplete  Blood culture (routine x 2)     Status: None (Preliminary result)   Collection Time: 06/29/23  6:32 PM   Specimen: BLOOD RIGHT WRIST  Result Value Ref Range Status   Specimen Description BLOOD RIGHT WRIST  Final   Special Requests   Final    BOTTLES DRAWN AEROBIC AND ANAEROBIC Blood Culture results may not be optimal due to an inadequate volume  of blood received in culture bottles   Culture   Final    NO GROWTH < 24 HOURS Performed at [facility], [address], [place], [place] [address]    Report Status PENDING  Incomplete     Radiology Studies: CT Angio Chest PE W/Cm &/Or Wo Cm Result Date: 06/29/2023 CLINICAL DATA:  Respiratory distress. Acute, non localized abdominal pain. Previous right lower lobe resection. EXAM: CT ANGIOGRAPHY CHEST CT ABDOMEN AND PELVIS WITH CONTRAST TECHNIQUE: Multidetector CT imaging of the chest was performed using the standard protocol during bolus administration of intravenous contrast. Multiplanar CT image reconstructions and MIPs were obtained to evaluate the vascular anatomy. Multidetector CT imaging of the abdomen and pelvis was performed using the standard protocol during bolus administration of intravenous contrast. RADIATION DOSE REDUCTION: This exam was performed according to the departmental dose-optimization program which includes automated exposure control, adjustment of the mA and/or kV according to patient size and/or use of iterative reconstruction technique. CONTRAST:  75mL OMNIPAQUE  IOHEXOL  350 MG/ML SOLN COMPARISON:  Portable chest obtained earlier today. Chest CTA dated 11/22/2022. PET-CT dated 04/18/2022. Abdomen and pelvis CT dated 06/01/2009. FINDINGS: CTA CHEST FINDINGS Cardiovascular: Normally opacified pulmonary arteries with no pulmonary arterial filling defects seen. Surgically absent right lower lobe pulmonary arteries. Prominent central pulmonary arteries with a main pulmonary artery diameter of 3.1 cm. Stable mildly enlarged heart. Minimal pericardial effusion with improvement. No significant residual pericardial fluid. Atheromatous calcifications, including the coronary arteries and aorta. Mediastinum/Nodes: No enlarged mediastinal,  hilar, or axillary lymph nodes. Thyroid  gland, trachea, and esophagus demonstrate no significant findings. Lungs/Pleura: Dense left lower lobe  consolidation with air bronchograms. Stable bilateral centrilobular and paraseptal bullous emphysema. Associated minimal diffuse bilateral ground glass opacity. Minimal bilateral pleural fluid. Mild right lower lobe atelectasis. Stable left lower lobe calcified granuloma. No lung nodules currently visualized. Musculoskeletal: Marked thoracic and moderate lower cervical spine degenerative changes. Review of the MIP images confirms the above findings. CT ABDOMEN and PELVIS FINDINGS Hepatobiliary: No focal liver abnormality is seen. No gallstones, gallbladder wall thickening, or biliary dilatation. Pancreas: Unremarkable. No pancreatic ductal dilatation or surrounding inflammatory changes. Spleen: Multiple calcified splenic granulomata. Adrenals/Urinary Tract: Normal-appearing adrenal glands. Bilateral simple appearing renal cysts, not needing imaging follow-up. Mild increase in bilateral perinephric soft tissue stranding and fluid. Unremarkable ureters and urinary bladder. No urinary tract calculi or hydronephrosis seen. Stomach/Bowel: Stomach is within normal limits. Appendix appears normal. No evidence of bowel wall thickening, distention, or inflammatory changes. Vascular/Lymphatic: Atheromatous arterial calcifications without aneurysm. No enlarged lymph nodes. Reproductive: Mildly enlarged prostate. Other: Small amount of free peritoneal fluid. Extensive bilateral subcutaneous edema. Musculoskeletal: Lumbar and lower thoracic spine degenerative changes. Review of the MIP images confirms the above findings. IMPRESSION: 1. No pulmonary emboli. 2. Dense left lower lobe consolidation with air bronchograms, compatible with severe pneumonia or consolidative atelectasis. 3. Minimal bilateral pleural fluid with minimal diffuse bilateral ground glass opacity, compatible with pulmonary edema. 4. Stable mild cardiomegaly. 5. Prominent central pulmonary arteries, suggesting pulmonary arterial hypertension. 6. Anasarca with a  small amount of ascites, increased perinephric fluid bilaterally and diffuse subcutaneous edema. 7.  Calcific coronary artery and aortic atherosclerosis. 8. Extensive bilateral emphysema. 9. Mildly enlarged prostate. Aortic Atherosclerosis (ICD10-I70.0) and Emphysema (ICD10-J43.9). Electronically Signed   By: Catherin Closs M.D.   On: 06/29/2023 16:46   CT ABDOMEN PELVIS W CONTRAST Result Date: 06/29/2023 CLINICAL DATA:  Respiratory distress. Acute, non localized abdominal pain. Previous right lower lobe resection. EXAM: CT ANGIOGRAPHY CHEST CT ABDOMEN AND PELVIS WITH CONTRAST TECHNIQUE: Multidetector CT imaging of the chest was performed using the standard protocol during bolus administration of intravenous contrast. Multiplanar CT image reconstructions and MIPs were obtained to evaluate the vascular anatomy. Multidetector CT imaging of the abdomen and pelvis was performed using the standard protocol during bolus administration of intravenous contrast. RADIATION DOSE REDUCTION: This exam was performed according to the departmental dose-optimization program which includes automated exposure control, adjustment of the mA and/or kV according to patient size and/or use of iterative reconstruction technique. CONTRAST:  75mL OMNIPAQUE  IOHEXOL  350 MG/ML SOLN COMPARISON:  Portable chest obtained earlier today. Chest CTA dated 11/22/2022. PET-CT dated 04/18/2022. Abdomen and pelvis CT dated 06/01/2009. FINDINGS: CTA CHEST FINDINGS Cardiovascular: Normally opacified pulmonary arteries with no pulmonary arterial filling defects seen. Surgically absent right lower lobe pulmonary arteries. Prominent central pulmonary arteries with a main pulmonary artery diameter of 3.1 cm. Stable mildly enlarged heart. Minimal pericardial effusion with improvement. No significant residual pericardial fluid. Atheromatous calcifications, including the coronary arteries and aorta. Mediastinum/Nodes: No enlarged mediastinal, hilar, or axillary  lymph nodes. Thyroid  gland, trachea, and esophagus demonstrate no significant findings. Lungs/Pleura: Dense left lower lobe consolidation with air bronchograms. Stable bilateral centrilobular and paraseptal bullous emphysema. Associated minimal diffuse bilateral ground glass opacity. Minimal bilateral pleural fluid. Mild right lower lobe atelectasis. Stable left lower lobe calcified granuloma. No lung nodules currently visualized. Musculoskeletal: Marked thoracic and moderate lower cervical spine degenerative changes. Review of the MIP images confirms the above findings.  CT ABDOMEN and PELVIS FINDINGS Hepatobiliary: No focal liver abnormality is seen. No gallstones, gallbladder wall thickening, or biliary dilatation. Pancreas: Unremarkable. No pancreatic ductal dilatation or surrounding inflammatory changes. Spleen: Multiple calcified splenic granulomata. Adrenals/Urinary Tract: Normal-appearing adrenal glands. Bilateral simple appearing renal cysts, not needing imaging follow-up. Mild increase in bilateral perinephric soft tissue stranding and fluid. Unremarkable ureters and urinary bladder. No urinary tract calculi or hydronephrosis seen. Stomach/Bowel: Stomach is within normal limits. Appendix appears normal. No evidence of bowel wall thickening, distention, or inflammatory changes. Vascular/Lymphatic: Atheromatous arterial calcifications without aneurysm. No enlarged lymph nodes. Reproductive: Mildly enlarged prostate. Other: Small amount of free peritoneal fluid. Extensive bilateral subcutaneous edema. Musculoskeletal: Lumbar and lower thoracic spine degenerative changes. Review of the MIP images confirms the above findings. IMPRESSION: 1. No pulmonary emboli. 2. Dense left lower lobe consolidation with air bronchograms, compatible with severe pneumonia or consolidative atelectasis. 3. Minimal bilateral pleural fluid with minimal diffuse bilateral ground glass opacity, compatible with pulmonary edema. 4. Stable  mild cardiomegaly. 5. Prominent central pulmonary arteries, suggesting pulmonary arterial hypertension. 6. Anasarca with a small amount of ascites, increased perinephric fluid bilaterally and diffuse subcutaneous edema. 7.  Calcific coronary artery and aortic atherosclerosis. 8. Extensive bilateral emphysema. 9. Mildly enlarged prostate. Aortic Atherosclerosis (ICD10-I70.0) and Emphysema (ICD10-J43.9). Electronically Signed   By: Catherin Closs M.D.   On: 06/29/2023 16:46   CT Head Wo Contrast Result Date: 06/29/2023 CLINICAL DATA:  Head trauma, minor (Age >= 65y); Neck trauma (Age >= 65y). Fall yesterday, landing on the right side of the neck. Neck pain. EXAM: CT HEAD WITHOUT CONTRAST CT CERVICAL SPINE WITHOUT CONTRAST TECHNIQUE: Multidetector CT imaging of the head and cervical spine was performed following the standard protocol without intravenous contrast. Multiplanar CT image reconstructions of the cervical spine were also generated. RADIATION DOSE REDUCTION: This exam was performed according to the departmental dose-optimization program which includes automated exposure control, adjustment of the mA and/or kV according to patient size and/or use of iterative reconstruction technique. COMPARISON:  None Available. FINDINGS: CT HEAD FINDINGS Brain: There is no evidence of an acute infarct, intracranial hemorrhage, mass, midline shift, or extra-axial fluid collection. Cerebral volume is normal. The ventricles are normal in size. Vascular: Calcified atherosclerosis at the skull base. No hyperdense vessel. Skull: No fracture or suspicious lesion. Sinuses/Orbits: Minimal mucosal thickening in the right maxillary sinus. Clear mastoid air cells. Unremarkable orbits. Other: None. CT CERVICAL SPINE FINDINGS Alignment: Mild reversal of the normal cervical lordosis. Grade 1 anterolisthesis of C2 on C3 and C3 on C4. Trace retrolisthesis of C5 on C6. Skull base and vertebrae: No acute fracture or suspicious lesion. Soft  tissues and spinal canal: No prevertebral fluid or swelling. No visible canal hematoma. Disc levels: Advanced disc degeneration at C5-6 and moderate degeneration at C3-4 and C4-5. Advanced facet arthrosis on the right at C2-3 and on the left at C3-4. Moderate to severe neural foraminal stenosis at each of these levels. Suspected mild spinal stenosis at C3-4 and C4-5 and potentially moderate spinal stenosis at C5-6. Upper chest: Largely excluded. Please see the separate contemporaneous CTA of the chest for evaluation. Other: Moderate atherosclerotic calcification of the carotid bifurcations. Partially visualized right internal jugular venous catheter. IMPRESSION: 1. No evidence of acute intracranial abnormality or cervical spine fracture. 2. Cervical disc and facet degeneration as above. Electronically Signed   By: Aundra Lee M.D.   On: 06/29/2023 16:14   CT Cervical Spine Wo Contrast Result Date: 06/29/2023 CLINICAL DATA:  Head trauma,  minor (Age >= 65y); Neck trauma (Age >= 65y). Fall yesterday, landing on the right side of the neck. Neck pain. EXAM: CT HEAD WITHOUT CONTRAST CT CERVICAL SPINE WITHOUT CONTRAST TECHNIQUE: Multidetector CT imaging of the head and cervical spine was performed following the standard protocol without intravenous contrast. Multiplanar CT image reconstructions of the cervical spine were also generated. RADIATION DOSE REDUCTION: This exam was performed according to the departmental dose-optimization program which includes automated exposure control, adjustment of the mA and/or kV according to patient size and/or use of iterative reconstruction technique. COMPARISON:  None Available. FINDINGS: CT HEAD FINDINGS Brain: There is no evidence of an acute infarct, intracranial hemorrhage, mass, midline shift, or extra-axial fluid collection. Cerebral volume is normal. The ventricles are normal in size. Vascular: Calcified atherosclerosis at the skull base. No hyperdense vessel. Skull: No  fracture or suspicious lesion. Sinuses/Orbits: Minimal mucosal thickening in the right maxillary sinus. Clear mastoid air cells. Unremarkable orbits. Other: None. CT CERVICAL SPINE FINDINGS Alignment: Mild reversal of the normal cervical lordosis. Grade 1 anterolisthesis of C2 on C3 and C3 on C4. Trace retrolisthesis of C5 on C6. Skull base and vertebrae: No acute fracture or suspicious lesion. Soft tissues and spinal canal: No prevertebral fluid or swelling. No visible canal hematoma. Disc levels: Advanced disc degeneration at C5-6 and moderate degeneration at C3-4 and C4-5. Advanced facet arthrosis on the right at C2-3 and on the left at C3-4. Moderate to severe neural foraminal stenosis at each of these levels. Suspected mild spinal stenosis at C3-4 and C4-5 and potentially moderate spinal stenosis at C5-6. Upper chest: Largely excluded. Please see the separate contemporaneous CTA of the chest for evaluation. Other: Moderate atherosclerotic calcification of the carotid bifurcations. Partially visualized right internal jugular venous catheter. IMPRESSION: 1. No evidence of acute intracranial abnormality or cervical spine fracture. 2. Cervical disc and facet degeneration as above. Electronically Signed   By: Aundra Lee M.D.   On: 06/29/2023 16:14   DG Chest Portable 1 View Result Date: 06/29/2023 CLINICAL DATA:  Increased work of breathing, chest injury EXAM: PORTABLE CHEST 1 VIEW COMPARISON:  06/27/2023 FINDINGS: Single frontal view of the chest demonstrates stable right internal jugular dialysis catheter. Cardiac silhouette is stable. New veiling opacity at the left lung base consistent with left lower lobe consolidation and/or effusion. No pneumothorax. Chronic elevation the right hemidiaphragm. IMPRESSION: 1. New left basilar veiling opacity consistent with left lower lobe consolidation and/or effusion. Electronically Signed   By: Bobbye Burrow M.D.   On: 06/29/2023 13:34     Kathlen Para, MD,  PhD [facility]  Between 7 am - 7 pm I am available, please contact me via Amion (for emergencies) or Securechat (non urgent messages)  Between 7 pm - 7 am I am not available, please contact night coverage MD/APP via Amion

## 2023-06-30 NOTE — Progress Notes (Signed)
 PT admitted to the unit from dialysis unit . PT is Ax0x4 . PT VSS and  assessment completed. Pt is oriented to the unit. Cardiac monitoring applied and tele called. Pt belongings are at the bedside with the patient. Pt wife was called upon arrival to the unit. RN notified respiratory of SOB on arrival pt was then started on HHFNC. Call-bell within reach and bed in the lowest position.   06/30/23 0857  Vitals  Temp 97.9 F (36.6 C)  Temp Source Oral  BP 97/71  MAP (mmHg) 80  BP Location Left Arm  BP Method Automatic  Patient Position (if appropriate) Lying  Pulse Rate 96  Pulse Rate Source Monitor  Resp (!) 22  MEWS COLOR  MEWS Score Color Yellow  Oxygen Therapy  SpO2 (!) 86 %  O2 Device Nasal Cannula  MEWS Score  MEWS Temp 0  MEWS Systolic 1  MEWS Pulse 0  MEWS RR 1  MEWS LOC 0  MEWS Score 2

## 2023-06-30 NOTE — Plan of Care (Signed)
  Problem: Education: Goal: Ability to describe self-care measures that may prevent or decrease complications (Diabetes Survival Skills Education) will improve Outcome: Not Progressing Goal: Individualized Educational Video(s) Outcome: Not Progressing   Problem: Coping: Goal: Ability to adjust to condition or change in health will improve Outcome: Not Progressing   Problem: Fluid Volume: Goal: Ability to maintain a balanced intake and output will improve Outcome: Not Progressing   Problem: Health Behavior/Discharge Planning: Goal: Ability to identify and utilize available resources and services will improve Outcome: Not Progressing Goal: Ability to manage health-related needs will improve Outcome: Not Progressing   Problem: Metabolic: Goal: Ability to maintain appropriate glucose levels will improve Outcome: Not Progressing   Problem: Nutritional: Goal: Maintenance of adequate nutrition will improve Outcome: Not Progressing Goal: Progress toward achieving an optimal weight will improve Outcome: Not Progressing   Problem: Skin Integrity: Goal: Risk for impaired skin integrity will decrease Outcome: Not Progressing   Problem: Tissue Perfusion: Goal: Adequacy of tissue perfusion will improve Outcome: Not Progressing   Problem: Activity: Goal: Ability to tolerate increased activity will improve Outcome: Not Progressing   Problem: Clinical Measurements: Goal: Ability to maintain a body temperature in the normal range will improve Outcome: Not Progressing   Problem: Respiratory: Goal: Ability to maintain adequate ventilation will improve Outcome: Not Progressing Goal: Ability to maintain a clear airway will improve Outcome: Not Progressing

## 2023-06-30 NOTE — Plan of Care (Signed)
  Problem: Education: Goal: Ability to describe self-care measures that may prevent or decrease complications (Diabetes Survival Skills Education) will improve Outcome: Progressing Goal: Individualized Educational Video(s) Outcome: Progressing   Problem: Coping: Goal: Ability to adjust to condition or change in health will improve Outcome: Progressing   Problem: Fluid Volume: Goal: Ability to maintain a balanced intake and output will improve Outcome: Progressing   Problem: Health Behavior/Discharge Planning: Goal: Ability to identify and utilize available resources and services will improve Outcome: Progressing Goal: Ability to manage health-related needs will improve Outcome: Progressing   Problem: Metabolic: Goal: Ability to maintain appropriate glucose levels will improve Outcome: Progressing   Problem: Nutritional: Goal: Maintenance of adequate nutrition will improve Outcome: Progressing Goal: Progress toward achieving an optimal weight will improve Outcome: Progressing   Problem: Skin Integrity: Goal: Risk for impaired skin integrity will decrease Outcome: Progressing   Problem: Tissue Perfusion: Goal: Adequacy of tissue perfusion will improve Outcome: Progressing   Problem: Activity: Goal: Ability to tolerate increased activity will improve Outcome: Progressing   Problem: Clinical Measurements: Goal: Ability to maintain a body temperature in the normal range will improve Outcome: Progressing   Problem: Respiratory: Goal: Ability to maintain adequate ventilation will improve Outcome: Progressing Goal: Ability to maintain a clear airway will improve Outcome: Progressing

## 2023-06-30 NOTE — Progress Notes (Signed)
   06/30/23 1038  Oxygen Therapy/Pulse Ox  O2 Device (S)  Bi-PAP;Ventilator  O2 Therapy Oxygen  FiO2 (%) 60 %    RT called by bedside RN per pt's request to be placed on BIPAP. Despite being on heated high flow [place], pt had increased WOB with RR in 40s.  Pt was placed on BIPAP 16/6 R15 60% and appeared to be more comfortable. RN and MD at bedside to witness.

## 2023-06-30 NOTE — Progress Notes (Signed)
   06/30/23 0943  Oxygen Therapy/Pulse Ox  O2 Device (S)  HHFNC  $ Heated High Flow Nasal Cannula  (S)  Yes  Heated High Flow Nasal Cannula  (S)  Adult Medium  $ Adult Medium (S)  Yes  O2 Therapy (S)  Oxygen humidified  Heater temperature (S)  91.4 F (33 C)  O2 Flow Rate (L/min) (S)  40 L/min  FiO2 (%) (S)  80 %    PT was placed on HHFNC due to increased WOB and desaturating. Pt is tolerating well at this time.

## 2023-07-01 DIAGNOSIS — J9601 Acute respiratory failure with hypoxia: Secondary | ICD-10-CM | POA: Diagnosis not present

## 2023-07-01 LAB — GLUCOSE, CAPILLARY
Glucose-Capillary: 85 mg/dL (ref 70–99)
Glucose-Capillary: 144 mg/dL — ABNORMAL HIGH (ref 70–99)
Glucose-Capillary: 161 mg/dL — ABNORMAL HIGH (ref 70–99)
Glucose-Capillary: 151 mg/dL — ABNORMAL HIGH (ref 70–99)

## 2023-07-01 LAB — HEPATITIS B SURFACE ANTIBODY, QUANTITATIVE: Hep B S AB Quant (Post): <3.5 m[IU]/mL — ABNORMAL LOW

## 2023-07-01 LAB — STREP PNEUMONIAE URINARY ANTIGEN: Strep Pneumo Urinary Antigen: NEGATIVE

## 2023-07-01 MED ORDER — HEPARIN SODIUM (PORCINE) 1000 UNIT/ML IJ SOLN
INTRAMUSCULAR | Status: AC
Start: 2023-07-01 — End: 2023-07-01
  Filled 2023-07-01: qty 4

## 2023-07-01 MED ORDER — PROSOURCE PLUS PO LIQD
30.0000 mL | Freq: Two times a day (BID) | ORAL | Status: DC
  Administered 2023-07-01 – 2023-07-03 (×5): 30 mL via ORAL
  Filled 2023-07-01 (×8): qty 30

## 2023-07-01 MED ORDER — AZITHROMYCIN 500 MG PO TABS
500.0000 mg | ORAL_TABLET | Freq: Every day | ORAL | Status: AC
  Administered 2023-07-02 – 2023-07-04 (×3): 500 mg via ORAL
  Filled 2023-07-01 (×3): qty 1

## 2023-07-01 MED ORDER — DARBEPOETIN ALFA 40 MCG/0.4ML IJ SOSY
40.0000 ug | PREFILLED_SYRINGE | INTRAMUSCULAR | Status: DC
  Administered 2023-07-01: 40 ug via SUBCUTANEOUS
  Filled 2023-07-01: qty 0.4

## 2023-07-01 MED ORDER — ALBUMIN HUMAN 25 % IV SOLN
INTRAVENOUS | Status: AC
Start: 2023-07-01 — End: 2023-07-01
  Filled 2023-07-01: qty 100

## 2023-07-01 MED ORDER — TRAZODONE HCL 50 MG PO TABS
25.0000 mg | ORAL_TABLET | Freq: Once | ORAL | Status: AC | PRN
Start: 2023-07-01 — End: 2023-07-01
  Administered 2023-07-01: 25 mg via ORAL
  Filled 2023-07-01: qty 1

## 2023-07-01 MED ORDER — ALBUMIN HUMAN 25 % IV SOLN
25.0000 g | Freq: Once | INTRAVENOUS | Status: AC
  Administered 2023-07-01: 25 g via INTRAVENOUS

## 2023-07-01 MED ORDER — FUROSEMIDE 40 MG PO TABS
40.0000 mg | ORAL_TABLET | Freq: Every day | ORAL | Status: DC
  Administered 2023-07-01 – 2023-07-06 (×6): 40 mg via ORAL
  Filled 2023-07-01 (×6): qty 1

## 2023-07-01 NOTE — Progress Notes (Signed)
 Received patient in bed to unit.  Alert and oriented.  Informed consent signed and in chart.   TX duration:3hours61min  Patient tolerated well.  Transported back to the room  Alert, without acute distress.  Hand-off given to patient's nurse.   Access used: catheter Access issues: none  Total UF removed: 3500 mls Medication(s) given: none Post HD VS: 132/67 Post HD weight: unable to obtain.   07/01/23 0638  Vitals  Temp 98.4 F (36.9 C)  Temp Source Axillary  BP 132/67  MAP (mmHg) 87  BP Location Left Arm  BP Method Automatic  Patient Position (if appropriate) Lying  Pulse Rate 77  Pulse Rate Source Monitor  ECG Heart Rate 81  Resp 11  Oxygen Therapy  SpO2 100 %  O2 Device Bi-PAP  Patient Activity (if Appropriate) In bed  Pulse Oximetry Type Continuous  During Treatment Monitoring  Blood Flow Rate (mL/min) 0 mL/min  Arterial Pressure (mmHg) 1.21 mmHg  Venous Pressure (mmHg) -2.83 mmHg  TMP (mmHg) 18.79 mmHg  Ultrafiltration Rate (mL/min) 1190 mL/min  Dialysate Flow Rate (mL/min) 299 ml/min  Duration of HD Treatment -hour(s) 3.5 hour(s)  Cumulative Fluid Removed (mL) per Treatment  3500.18  Post Treatment  Dialyzer Clearance Lightly streaked  Hemodialysis Intake (mL) 0 mL  Liters Processed 84  Fluid Removed (mL) 3500 mL  Tolerated HD Treatment Yes  Post-Hemodialysis Comments HD tx completed as expected, tolerated well.  pt is stable.  AVG/AVF Arterial Site Held (minutes) 0 minutes  AVG/AVF Venous Site Held (minutes) 0 minutes  Hemodialysis Catheter Right Internal jugular  Placement Date/Time: 06/27/23 0756   Placed prior to admission: No  Serial / Lot #: [id number]  Expiration Date: 02/10/28  Verification by X-ray: Yes  Person Inserting LDA: Lanis  Orientation: Right  Access Location: Internal jugular  Site Condition No complications  Blue Lumen Status Flushed;Heparin  locked;Dead end cap in place  Red Lumen Status Flushed;Heparin  locked;Dead end cap in place   Purple Lumen Status N/A  Catheter fill solution Heparin  1000 units/ml  Catheter fill volume (Arterial) 1.6 cc  Catheter fill volume (Venous) 1.6  Dressing Type Transparent  Dressing Status Antimicrobial disc/dressing in place  Drainage Description None  Post treatment catheter status Capped and Clamped      [facility]

## 2023-07-01 NOTE — Progress Notes (Signed)
 This patient is receiving Azithromycin  by the intravenous route. Based on criteria approved by the Pharmacy and Therapeutics  Committee,  Azithromycin  is being converted to equivalent oral dose form(s).  These criteria include:  A. Has a documented ability to take oral medications (tolerating oral or gastric tube feedings for  >24 hours OR taking other scheduled oral medications for >24 hours).   B. Expected plan for continued treatment for at least 1 day.   C. Has documented clinical improvement; the patient must demonstrate:  1. 24-hour maximum temperature of <100.5 F  2. MD/patient assessment of improvement  If you have questions about this conversion, please contact the pharmacy department.  Thank you.  Rankin Sams, PharmD, BCPS, BCCCP Clinical Pharmacist

## 2023-07-01 NOTE — Progress Notes (Signed)
 Pt transported from 4NP07 to dialysis on BIPAP w/o any complications.

## 2023-07-01 NOTE — Progress Notes (Signed)
 PROGRESS NOTE  Walter Wilkerson FMW:[id number] DOB: 1950/07/18 DOA: 06/29/2023 PCP: Antoniette Vermell CROME, PA-C   LOS: 2 days   Brief Narrative / Interim history: 73 year old male with HTN, hypothyroidism, colon cancer s/p resection, anemia, thrombocytopenia, thoracic aortic aneurysm, newly diagnosed ESRD just darted on dialysis comes into the hospital with shortness of breath during his first dialysis episode.  He tells me he has noticed fluid overload in his legs, and increased shortness of breath even before starting HD.  Upon presentation in the HD unit, while having dialysis, has complained of persistent shortness of breath and EMS was called and the session was stopped.  He was hypoxic and brought to the hospital.  He was initially placed on BiPAP.  Imaging showed evidence of fluid overload and possible left lower lobe pneumonia versus consolidative atelectasis.  Nephrology was consulted and he was admitted to the hospital and emergently placed on HD  Subjective / 24h Interval events: Seen in dialysis and later on the floor, respiratory status is much better and he feels much better.  He is not breathing as hard as he was yesterday  Assesement and Plan: Principal Problem:   Acute respiratory failure with hypoxia (HCC) Active Problems:   Acquired hypothyroidism   Essential hypertension   Chronic pain syndrome   History of lung cancer   Thoracic aortic aneurysm 40 mm very of CT from 2024   Thrombocytopenia with normocytic iron  deficiency anemia   Stage 4 chronic kidney disease (HCC)  Principal problem Acute hypoxemic respiratory failure in the setting of acute pulmonary edema/volume overload in the setting of ESRD -patient just started on dialysis, imaging on admission showed significant fluid overload with crackles on exam, tachypnea and increased work of breathing. - Had dialysis x 2, yesterday morning as well as this morning, total removed 7 L.  He is feeling better, but remains hypoxic on  3 L of oxygen, just came off BiPAP this morning.  Wean off to room air as tolerated.  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  He is on steroids as an outpatient, continue.  Nephrology consulted.  The [place] as above  Possible left lower lobe CAP -CT scan on admission with dense left lower lobe consolidation with air bronchograms, severe pneumonia versus consolidative atelectasis.  He did have a cough but no fevers.  Once respiratory status improves and he looks more euvolemic, we will repeat a chest x-ray and discontinue antibiotics if pneumonia resolves with fluid removal  Hypotension -requiring midodrine  prior to dialysis, but tolerating dialysis well for now.  Pancytopenia-overall stable, continue to monitor  Essential hypertension-hold home antihypertensives given hypotension on admission, has been placed on midodrine  to allow aggressive fluid removal, blood pressure actually normal now.  Continue midodrine   Hypothyroidism-continue home Synthroid   History of lung cancer-status post resection   Scheduled Meds:  (feeding supplement) PROSource Plus  30 mL Oral BID BM   atorvastatin   40 mg Oral Daily   budesonide -glycopyrrolate -formoterol   2 puff Inhalation BID   Chlorhexidine  Gluconate Cloth  6 each Topical Q0600   darbepoetin (ARANESP ) injection - DIALYSIS  40 mcg Subcutaneous Q Sat-1800   furosemide   40 mg Oral Daily   heparin   5,000 Units Subcutaneous Q8H   levothyroxine   175 mcg Oral QAC breakfast   melatonin  10 mg Oral QHS   midodrine   10 mg Oral Q8H   pantoprazole   40 mg Oral Daily   predniSONE   20 mg Oral Q breakfast  sodium chloride  flush  3 mL Intravenous Q12H   Continuous Infusions:  azithromycin  500 mg (07/01/23 0756)   cefTRIAXone  (ROCEPHIN )  IV 2 g (07/01/23 0746)   PRN Meds:.acetaminophen  **OR** acetaminophen , ipratropium-albuterol , polyethylene glycol  Current Outpatient Medications   Medication Instructions   albuterol  (VENTOLIN  HFA) 108 (90 Base) MCG/ACT inhaler 2 puffs, Inhalation, Every 4 hours PRN   amLODipine  (NORVASC ) 10 mg, Oral, Every morning   aspirin  EC 81 mg, Oral, Daily   atorvastatin  (LIPITOR) 40 mg, Oral, Daily   carvedilol  (COREG ) 6.25 mg, Oral, 2 times daily with meals   clindamycin (CLEOCIN T) 1 % external solution 1 Application, Topical, 2 times daily PRN   clobetasol cream (TEMOVATE) 0.05 % 1 Application, Topical, 2 times daily PRN   Continuous Glucose Sensor (FREESTYLE LIBRE 3 SENSOR) MISC 1 each, Does not apply, Every 14 days, Please apply for 14 days and then switch to new sensor   cyanocobalamin  (VITAMIN B12) 1,000 mcg, Oral, Daily   DULoxetine  (CYMBALTA ) 30 mg, Oral, Daily   Fluticasone -Umeclidin-Vilant (TRELEGY ELLIPTA ) 100-62.5-25 MCG/ACT AEPB 1 puff, Inhalation, Daily   Glucagon  (GVOKE HYPOPEN  2-PACK) 1 MG/0.2ML SOAJ Inject 1mg  pen into outer thigh or lower abdomen if blood sugar less than 54.   insulin  glargine, 1 Unit Dial, (TOUJEO  SOLOSTAR) 300 UNIT/ML Solostar Pen Increase by 2 units every 5 days until fasting sugars 90-120. Max daily dosage 20 units   levothyroxine  (SYNTHROID ) 175 mcg, Oral, Daily before breakfast   Melatonin 10 mg, Oral, Daily at bedtime   pantoprazole  (PROTONIX ) 40 mg, Oral, Daily   predniSONE  (DELTASONE ) 20 mg, Oral, Daily with breakfast   sodium bicarbonate  650 mg, Oral, 3 times daily   Vitamin D  (Ergocalciferol ) (DRISDOL ) 50,000 Units, Oral, Every 7 days    Diet Orders (From admission, onward)     Start     Ordered   06/30/23 0937  Diet renal with fluid restriction Fluid restriction: 1200 mL Fluid; Room service appropriate? Yes; Fluid consistency: Thin  Diet effective now       Question Answer Comment  Fluid restriction: 1200 mL Fluid   Room service appropriate? Yes   Fluid consistency: Thin      06/30/23 0936            DVT prophylaxis: heparin  injection 5,000 Units Start: 06/29/23 2200   Lab  Results  Component Value Date   PLT 86 (L) 06/30/2023      Code Status: Full Code  Family Communication: No family at bedside  Status is: Inpatient Remains inpatient appropriate because: Severity of illness   Level of care: Progressive  Consultants:  Nephrology  Objective: Vitals:   07/01/23 9372 07/01/23 9361 07/01/23 0727 07/01/23 0812  BP: 121/78 132/67 125/77   Pulse: 93 77  83  Resp: 17 11  (!) (P) 22  Temp:  98.4 F (36.9 C) 98.2 F (36.8 C)   TempSrc:  Axillary Oral   SpO2: 100% 100% 93% 96%  Weight:      Height:        Intake/Output Summary (Last 24 hours) at 07/01/2023 9061 Last data filed at 07/01/2023 9361 Gross per 24 hour  Intake 722.87 ml  Output 3600 ml  Net -2877.13 ml   Wt Readings from Last 3 Encounters:  06/29/23 78.5 kg  06/27/23 79.4 kg  06/22/23 79.1 kg    Examination:  Constitutional: NAD Eyes: lids and conjunctivae normal, no scleral icterus ENMT: mmm Neck: normal, supple Respiratory: Diminished at the bases, no crackles or wheezing Cardiovascular:  Regular rate and rhythm, no murmurs / rubs / gallops. No LE edema. Abdomen: soft, no distention, no tenderness. Bowel sounds positive.    Data Reviewed: I have independently reviewed following labs and imaging studies   CBC Recent Labs  Lab 06/27/23 0626 06/29/23 1220 06/29/23 1225 06/30/23 1428  WBC  --  1.5*  --  9.3  HGB 7.5* 8.8* 8.5*  8.5* 7.7*  HCT 22.0* 26.6* 25.0*  25.0* 22.7*  PLT  --  131*  --  86*  MCV  --  92.0  --  90.1  MCH  --  30.4  --  30.6  MCHC  --  33.1  --  33.9  RDW  --  15.1  --  15.3  LYMPHSABS  --  0.7  --   --   MONOABS  --  0.1  --   --   EOSABS  --  0.1  --   --   BASOSABS  --  0.0  --   --     Recent Labs  Lab 06/27/23 0626 06/29/23 1220 06/29/23 1225 06/29/23 1503 06/29/23 1749 06/30/23 1428  NA 138 136 136  136  --   --  135  K 3.8 3.9 3.9  3.9  --   --  4.3  CL 106 103 101  --   --  99  CO2  --  22  --   --   --  23  GLUCOSE  99 96 90  --   --  119*  BUN 110* 83* 84*  --   --  45*  CREATININE 6.80* 4.87* 4.70*  --   --  3.18*  CALCIUM   --  7.1*  --   --   --  7.7*  AST  --  20  --   --   --   --   ALT  --  9  --   --   --   --   ALKPHOS  --  154*  --   --   --   --   BILITOT  --  0.5  --   --   --   --   ALBUMIN   --  2.2*  --   --   --  2.3*  PROCALCITON  --   --   --   --   --  101.91  LATICACIDVEN  --   --  2.8* 1.8  --   --   BNP  --   --   --   --  1,277.5*  --     ------------------------------------------------------------------------------------------------------------------ No results for input(s): CHOL, HDL, LDLCALC, TRIG, CHOLHDL, LDLDIRECT in the last 72 hours.  Lab Results  Component Value Date   HGBA1C 5.7 (H) 05/07/2023   ------------------------------------------------------------------------------------------------------------------ No results for input(s): TSH, T4TOTAL, T3FREE, THYROIDAB in the last 72 hours.  Invalid input(s): FREET3  Cardiac Enzymes No results for input(s): CKMB, TROPONINI, MYOGLOBIN in the last 168 hours.  Invalid input(s): CK ------------------------------------------------------------------------------------------------------------------    Component Value Date/Time   BNP 1,277.5 (H) 06/29/2023 1749    CBG: Recent Labs  Lab 06/30/23 1315 06/30/23 1517 06/30/23 1915 06/30/23 2335 07/01/23 0754  GLUCAP 100* 127* 146* 132* 85    Recent Results (from the past 240 hours)  Resp panel by RT-PCR (RSV, Flu A&B, Covid) Anterior Nasal Swab     Status: None   Collection Time: 06/29/23 12:21 PM   Specimen: Anterior Nasal Swab  Result Value Ref Range Status  SARS Coronavirus 2 by RT PCR NEGATIVE NEGATIVE Final   Influenza A by PCR NEGATIVE NEGATIVE Final   Influenza B by PCR NEGATIVE NEGATIVE Final    Comment: (NOTE) The Xpert Xpress SARS-CoV-2/FLU/RSV plus assay is intended as an aid in the diagnosis of influenza from  Nasopharyngeal swab specimens and should not be used as a sole basis for treatment. Nasal washings and aspirates are unacceptable for Xpert Xpress SARS-CoV-2/FLU/RSV testing.  Fact Sheet for Patients: BloggerCourse.com  Fact Sheet for Healthcare Providers: SeriousBroker.it  This test is not yet approved or cleared by the [place]  FDA and has been authorized for detection and/or diagnosis of SARS-CoV-2 by FDA under an Emergency Use Authorization (EUA). This EUA will remain in effect (meaning this test can be used) for the duration of the COVID-19 declaration under Section 564(b)(1) of the Act, 21 U.S.C. section 360bbb-3(b)(1), unless the authorization is terminated or revoked.     Resp Syncytial Virus by PCR NEGATIVE NEGATIVE Final    Comment: (NOTE) Fact Sheet for Patients: BloggerCourse.com  Fact Sheet for Healthcare Providers: SeriousBroker.it  This test is not yet approved or cleared by the [place]  FDA and has been authorized for detection and/or diagnosis of SARS-CoV-2 by FDA under an Emergency Use Authorization (EUA). This EUA will remain in effect (meaning this test can be used) for the duration of the COVID-19 declaration under Section 564(b)(1) of the Act, 21 U.S.C. section 360bbb-3(b)(1), unless the authorization is terminated or revoked.  Performed at [facility], [address], [place], [place] [address]   Blood culture (routine x 2)     Status: None (Preliminary result)   Collection Time: 06/29/23  5:47 PM   Specimen: BLOOD RIGHT WRIST  Result Value Ref Range Status   Specimen Description BLOOD RIGHT WRIST  Final   Special Requests   Final    BOTTLES DRAWN AEROBIC AND ANAEROBIC Blood Culture adequate volume   Culture   Final    NO GROWTH 2 DAYS Performed at [facility], [address], [place], [place] [address]    Report Status  PENDING  Incomplete  Blood culture (routine x 2)     Status: None (Preliminary result)   Collection Time: 06/29/23  6:32 PM   Specimen: BLOOD RIGHT WRIST  Result Value Ref Range Status   Specimen Description BLOOD RIGHT WRIST  Final   Special Requests   Final    BOTTLES DRAWN AEROBIC AND ANAEROBIC Blood Culture results may not be optimal due to an inadequate volume of blood received in culture bottles   Culture   Final    NO GROWTH 2 DAYS Performed at [facility], [address], [place], [place] [address]    Report Status PENDING  Incomplete     Radiology Studies: No results found.    Nilda Fendt, MD, PhD [facility]  Between 7 am - 7 pm I am available, please contact me via Amion (for emergencies) or Securechat (non urgent messages)  Between 7 pm - 7 am I am not available, please contact night coverage MD/APP via Amion

## 2023-07-01 NOTE — Progress Notes (Signed)
 [facility] Progress Note   Subjective:  Seen in room - s/p HD overnight, another 3.5L off. Says he feels a lot better. Still using O2 and mildly dyspneic. No CP or abd pain.  Objective Vitals:   07/01/23 0627 07/01/23 0638 07/01/23 0727 07/01/23 0812  BP: 121/78 132/67 125/77   Pulse: 93 77  83  Resp: 17 11  (!) (P) 22  Temp:  98.4 F (36.9 C) 98.2 F (36.8 C)   TempSrc:  Axillary Oral   SpO2: 100% 100% 93% 96%  Weight:      Height:       Physical Exam General: Well appearing man, NAD. Nasal O2 in place Heart: RRR; no murmur Lungs: Scattered rales Abdomen: soft Extremities: no LE edema Dialysis Access:  TDC in R chest  Additional Objective Labs: Basic Metabolic Panel: Recent Labs  Lab 06/29/23 1220 06/29/23 1225 06/30/23 1428  NA 136 136  136 135  K 3.9 3.9  3.9 4.3  CL 103 101 99  CO2 22  --  23  GLUCOSE 96 90 119*  BUN 83* 84* 45*  CREATININE 4.87* 4.70* 3.18*  CALCIUM  7.1*  --  7.7*  PHOS  --   --  4.4   Liver Function Tests: Recent Labs  Lab 06/29/23 1220 06/30/23 1428  AST 20  --   ALT 9  --   ALKPHOS 154*  --   BILITOT 0.5  --   PROT 4.8*  --   ALBUMIN  2.2* 2.3*   Recent Labs  Lab 06/29/23 1232  LIPASE 28   CBC: Recent Labs  Lab 06/29/23 1220 06/29/23 1225 06/30/23 1428  WBC 1.5*  --  9.3  NEUTROABS 0.5*  --   --   HGB 8.8* 8.5*  8.5* 7.7*  HCT 26.6* 25.0*  25.0* 22.7*  MCV 92.0  --  90.1  PLT 131*  --  86*   Blood Culture    Component Value Date/Time   SDES BLOOD RIGHT WRIST 06/29/2023 1832   SPECREQUEST  06/29/2023 1832    BOTTLES DRAWN AEROBIC AND ANAEROBIC Blood Culture results may not be optimal due to an inadequate volume of blood received in culture bottles   CULT  06/29/2023 1832    NO GROWTH 2 DAYS Performed at [facility], [address], [place], [place] [address]    REPTSTATUS PENDING 06/29/2023 1832   Studies/Results: CT Angio Chest PE W/Cm &/Or Wo Cm Result Date:  06/29/2023 CLINICAL DATA:  Respiratory distress. Acute, non localized abdominal pain. Previous right lower lobe resection. EXAM: CT ANGIOGRAPHY CHEST CT ABDOMEN AND PELVIS WITH CONTRAST TECHNIQUE: Multidetector CT imaging of the chest was performed using the standard protocol during bolus administration of intravenous contrast. Multiplanar CT image reconstructions and MIPs were obtained to evaluate the vascular anatomy. Multidetector CT imaging of the abdomen and pelvis was performed using the standard protocol during bolus administration of intravenous contrast. RADIATION DOSE REDUCTION: This exam was performed according to the departmental dose-optimization program which includes automated exposure control, adjustment of the mA and/or kV according to patient size and/or use of iterative reconstruction technique. CONTRAST:  75mL OMNIPAQUE  IOHEXOL  350 MG/ML SOLN COMPARISON:  Portable chest obtained earlier today. Chest CTA dated 11/22/2022. PET-CT dated 04/18/2022. Abdomen and pelvis CT dated 06/01/2009. FINDINGS: CTA CHEST FINDINGS Cardiovascular: Normally opacified pulmonary arteries with no pulmonary arterial filling defects seen. Surgically absent right lower lobe pulmonary arteries. Prominent central pulmonary arteries with a main pulmonary artery diameter of 3.1 cm.  Stable mildly enlarged heart. Minimal pericardial effusion with improvement. No significant residual pericardial fluid. Atheromatous calcifications, including the coronary arteries and aorta. Mediastinum/Nodes: No enlarged mediastinal, hilar, or axillary lymph nodes. Thyroid  gland, trachea, and esophagus demonstrate no significant findings. Lungs/Pleura: Dense left lower lobe consolidation with air bronchograms. Stable bilateral centrilobular and paraseptal bullous emphysema. Associated minimal diffuse bilateral ground glass opacity. Minimal bilateral pleural fluid. Mild right lower lobe atelectasis. Stable left lower lobe calcified granuloma. No  lung nodules currently visualized. Musculoskeletal: Marked thoracic and moderate lower cervical spine degenerative changes. Review of the MIP images confirms the above findings. CT ABDOMEN and PELVIS FINDINGS Hepatobiliary: No focal liver abnormality is seen. No gallstones, gallbladder wall thickening, or biliary dilatation. Pancreas: Unremarkable. No pancreatic ductal dilatation or surrounding inflammatory changes. Spleen: Multiple calcified splenic granulomata. Adrenals/Urinary Tract: Normal-appearing adrenal glands. Bilateral simple appearing renal cysts, not needing imaging follow-up. Mild increase in bilateral perinephric soft tissue stranding and fluid. Unremarkable ureters and urinary bladder. No urinary tract calculi or hydronephrosis seen. Stomach/Bowel: Stomach is within normal limits. Appendix appears normal. No evidence of bowel wall thickening, distention, or inflammatory changes. Vascular/Lymphatic: Atheromatous arterial calcifications without aneurysm. No enlarged lymph nodes. Reproductive: Mildly enlarged prostate. Other: Small amount of free peritoneal fluid. Extensive bilateral subcutaneous edema. Musculoskeletal: Lumbar and lower thoracic spine degenerative changes. Review of the MIP images confirms the above findings. IMPRESSION: 1. No pulmonary emboli. 2. Dense left lower lobe consolidation with air bronchograms, compatible with severe pneumonia or consolidative atelectasis. 3. Minimal bilateral pleural fluid with minimal diffuse bilateral ground glass opacity, compatible with pulmonary edema. 4. Stable mild cardiomegaly. 5. Prominent central pulmonary arteries, suggesting pulmonary arterial hypertension. 6. Anasarca with a small amount of ascites, increased perinephric fluid bilaterally and diffuse subcutaneous edema. 7.  Calcific coronary artery and aortic atherosclerosis. 8. Extensive bilateral emphysema. 9. Mildly enlarged prostate. Aortic Atherosclerosis (ICD10-I70.0) and Emphysema  (ICD10-J43.9). Electronically Signed   By: Elspeth Bathe M.D.   On: 06/29/2023 16:46   CT ABDOMEN PELVIS W CONTRAST Result Date: 06/29/2023 CLINICAL DATA:  Respiratory distress. Acute, non localized abdominal pain. Previous right lower lobe resection. EXAM: CT ANGIOGRAPHY CHEST CT ABDOMEN AND PELVIS WITH CONTRAST TECHNIQUE: Multidetector CT imaging of the chest was performed using the standard protocol during bolus administration of intravenous contrast. Multiplanar CT image reconstructions and MIPs were obtained to evaluate the vascular anatomy. Multidetector CT imaging of the abdomen and pelvis was performed using the standard protocol during bolus administration of intravenous contrast. RADIATION DOSE REDUCTION: This exam was performed according to the departmental dose-optimization program which includes automated exposure control, adjustment of the mA and/or kV according to patient size and/or use of iterative reconstruction technique. CONTRAST:  75mL OMNIPAQUE  IOHEXOL  350 MG/ML SOLN COMPARISON:  Portable chest obtained earlier today. Chest CTA dated 11/22/2022. PET-CT dated 04/18/2022. Abdomen and pelvis CT dated 06/01/2009. FINDINGS: CTA CHEST FINDINGS Cardiovascular: Normally opacified pulmonary arteries with no pulmonary arterial filling defects seen. Surgically absent right lower lobe pulmonary arteries. Prominent central pulmonary arteries with a main pulmonary artery diameter of 3.1 cm. Stable mildly enlarged heart. Minimal pericardial effusion with improvement. No significant residual pericardial fluid. Atheromatous calcifications, including the coronary arteries and aorta. Mediastinum/Nodes: No enlarged mediastinal, hilar, or axillary lymph nodes. Thyroid  gland, trachea, and esophagus demonstrate no significant findings. Lungs/Pleura: Dense left lower lobe consolidation with air bronchograms. Stable bilateral centrilobular and paraseptal bullous emphysema. Associated minimal diffuse bilateral ground  glass opacity. Minimal bilateral pleural fluid. Mild right lower lobe atelectasis. Stable left lower lobe  calcified granuloma. No lung nodules currently visualized. Musculoskeletal: Marked thoracic and moderate lower cervical spine degenerative changes. Review of the MIP images confirms the above findings. CT ABDOMEN and PELVIS FINDINGS Hepatobiliary: No focal liver abnormality is seen. No gallstones, gallbladder wall thickening, or biliary dilatation. Pancreas: Unremarkable. No pancreatic ductal dilatation or surrounding inflammatory changes. Spleen: Multiple calcified splenic granulomata. Adrenals/Urinary Tract: Normal-appearing adrenal glands. Bilateral simple appearing renal cysts, not needing imaging follow-up. Mild increase in bilateral perinephric soft tissue stranding and fluid. Unremarkable ureters and urinary bladder. No urinary tract calculi or hydronephrosis seen. Stomach/Bowel: Stomach is within normal limits. Appendix appears normal. No evidence of bowel wall thickening, distention, or inflammatory changes. Vascular/Lymphatic: Atheromatous arterial calcifications without aneurysm. No enlarged lymph nodes. Reproductive: Mildly enlarged prostate. Other: Small amount of free peritoneal fluid. Extensive bilateral subcutaneous edema. Musculoskeletal: Lumbar and lower thoracic spine degenerative changes. Review of the MIP images confirms the above findings. IMPRESSION: 1. No pulmonary emboli. 2. Dense left lower lobe consolidation with air bronchograms, compatible with severe pneumonia or consolidative atelectasis. 3. Minimal bilateral pleural fluid with minimal diffuse bilateral ground glass opacity, compatible with pulmonary edema. 4. Stable mild cardiomegaly. 5. Prominent central pulmonary arteries, suggesting pulmonary arterial hypertension. 6. Anasarca with a small amount of ascites, increased perinephric fluid bilaterally and diffuse subcutaneous edema. 7.  Calcific coronary artery and aortic  atherosclerosis. 8. Extensive bilateral emphysema. 9. Mildly enlarged prostate. Aortic Atherosclerosis (ICD10-I70.0) and Emphysema (ICD10-J43.9). Electronically Signed   By: Elspeth Bathe M.D.   On: 06/29/2023 16:46   CT Head Wo Contrast Result Date: 06/29/2023 CLINICAL DATA:  Head trauma, minor (Age >= 65y); Neck trauma (Age >= 65y). Fall yesterday, landing on the right side of the neck. Neck pain. EXAM: CT HEAD WITHOUT CONTRAST CT CERVICAL SPINE WITHOUT CONTRAST TECHNIQUE: Multidetector CT imaging of the head and cervical spine was performed following the standard protocol without intravenous contrast. Multiplanar CT image reconstructions of the cervical spine were also generated. RADIATION DOSE REDUCTION: This exam was performed according to the departmental dose-optimization program which includes automated exposure control, adjustment of the mA and/or kV according to patient size and/or use of iterative reconstruction technique. COMPARISON:  None Available. FINDINGS: CT HEAD FINDINGS Brain: There is no evidence of an acute infarct, intracranial hemorrhage, mass, midline shift, or extra-axial fluid collection. Cerebral volume is normal. The ventricles are normal in size. Vascular: Calcified atherosclerosis at the skull base. No hyperdense vessel. Skull: No fracture or suspicious lesion. Sinuses/Orbits: Minimal mucosal thickening in the right maxillary sinus. Clear mastoid air cells. Unremarkable orbits. Other: None. CT CERVICAL SPINE FINDINGS Alignment: Mild reversal of the normal cervical lordosis. Grade 1 anterolisthesis of C2 on C3 and C3 on C4. Trace retrolisthesis of C5 on C6. Skull base and vertebrae: No acute fracture or suspicious lesion. Soft tissues and spinal canal: No prevertebral fluid or swelling. No visible canal hematoma. Disc levels: Advanced disc degeneration at C5-6 and moderate degeneration at C3-4 and C4-5. Advanced facet arthrosis on the right at C2-3 and on the left at C3-4. Moderate to  severe neural foraminal stenosis at each of these levels. Suspected mild spinal stenosis at C3-4 and C4-5 and potentially moderate spinal stenosis at C5-6. Upper chest: Largely excluded. Please see the separate contemporaneous CTA of the chest for evaluation. Other: Moderate atherosclerotic calcification of the carotid bifurcations. Partially visualized right internal jugular venous catheter. IMPRESSION: 1. No evidence of acute intracranial abnormality or cervical spine fracture. 2. Cervical disc and facet degeneration as above. Electronically Signed  By: Dasie Hamburg M.D.   On: 06/29/2023 16:14   CT Cervical Spine Wo Contrast Result Date: 06/29/2023 CLINICAL DATA:  Head trauma, minor (Age >= 65y); Neck trauma (Age >= 65y). Fall yesterday, landing on the right side of the neck. Neck pain. EXAM: CT HEAD WITHOUT CONTRAST CT CERVICAL SPINE WITHOUT CONTRAST TECHNIQUE: Multidetector CT imaging of the head and cervical spine was performed following the standard protocol without intravenous contrast. Multiplanar CT image reconstructions of the cervical spine were also generated. RADIATION DOSE REDUCTION: This exam was performed according to the departmental dose-optimization program which includes automated exposure control, adjustment of the mA and/or kV according to patient size and/or use of iterative reconstruction technique. COMPARISON:  None Available. FINDINGS: CT HEAD FINDINGS Brain: There is no evidence of an acute infarct, intracranial hemorrhage, mass, midline shift, or extra-axial fluid collection. Cerebral volume is normal. The ventricles are normal in size. Vascular: Calcified atherosclerosis at the skull base. No hyperdense vessel. Skull: No fracture or suspicious lesion. Sinuses/Orbits: Minimal mucosal thickening in the right maxillary sinus. Clear mastoid air cells. Unremarkable orbits. Other: None. CT CERVICAL SPINE FINDINGS Alignment: Mild reversal of the normal cervical lordosis. Grade 1  anterolisthesis of C2 on C3 and C3 on C4. Trace retrolisthesis of C5 on C6. Skull base and vertebrae: No acute fracture or suspicious lesion. Soft tissues and spinal canal: No prevertebral fluid or swelling. No visible canal hematoma. Disc levels: Advanced disc degeneration at C5-6 and moderate degeneration at C3-4 and C4-5. Advanced facet arthrosis on the right at C2-3 and on the left at C3-4. Moderate to severe neural foraminal stenosis at each of these levels. Suspected mild spinal stenosis at C3-4 and C4-5 and potentially moderate spinal stenosis at C5-6. Upper chest: Largely excluded. Please see the separate contemporaneous CTA of the chest for evaluation. Other: Moderate atherosclerotic calcification of the carotid bifurcations. Partially visualized right internal jugular venous catheter. IMPRESSION: 1. No evidence of acute intracranial abnormality or cervical spine fracture. 2. Cervical disc and facet degeneration as above. Electronically Signed   By: Dasie Hamburg M.D.   On: 06/29/2023 16:14   DG Chest Portable 1 View Result Date: 06/29/2023 CLINICAL DATA:  Increased work of breathing, chest injury EXAM: PORTABLE CHEST 1 VIEW COMPARISON:  06/27/2023 FINDINGS: Single frontal view of the chest demonstrates stable right internal jugular dialysis catheter. Cardiac silhouette is stable. New veiling opacity at the left lung base consistent with left lower lobe consolidation and/or effusion. No pneumothorax. Chronic elevation the right hemidiaphragm. IMPRESSION: 1. New left basilar veiling opacity consistent with left lower lobe consolidation and/or effusion. Electronically Signed   By: Ozell Daring M.D.   On: 06/29/2023 13:34   Medications:  azithromycin  500 mg (07/01/23 0756)   cefTRIAXone  (ROCEPHIN )  IV 2 g (07/01/23 0746)    atorvastatin   40 mg Oral Daily   budesonide -glycopyrrolate -formoterol   2 puff Inhalation BID   Chlorhexidine  Gluconate Cloth  6 each Topical Q0600   heparin   5,000 Units  Subcutaneous Q8H   levothyroxine   175 mcg Oral QAC breakfast   melatonin  10 mg Oral QHS   midodrine   10 mg Oral Q8H   pantoprazole   40 mg Oral Daily   predniSONE   20 mg Oral Q breakfast   sodium chloride  flush  3 mL Intravenous Q12H    Dialysis Orders TTS - [facility] ->started HD 6/19, only 1 HD there so far 3 -> 4hr, 300 -> 400 BFR, EDW 78kg, 2K/2Ca bath, TDC - no meds so far  Assessment/Plan: AHRF: Pulm edema + COPD + pneumonia, on abx and O2, improving with volume off with HD. Suspect will need home O2. 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 Continue Lasix   daily for now. Next HD Tues -> possibly Monday pending respiratory status. HTN/volume: Off antihypertensives, midodrine  started - improved BP.  Anemia of ESRD: Hgb trending down, will start Aranesp . Secondary HPTH: CorrCa, Phos ok - no binder or VDRA for now. Nutrition: Continue renal diet, will add supplements. 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 Rituximan #2 05/30/23. pANCA titer 1:320 5/6 with UPC 300mg /g.  Decision not to offer more Rituximab  given complications with steroid cream jitteriness, fungal infection, puffy face and hyperglycemia requiring insulin  as well as BUN/Cr >112/7.4 on 6/4. Prednisone  was lowered to 20 mg and Bactrim  discontinued.    Izetta Boehringer, PA-C 07/01/2023, 8:54 AM  [employer]

## 2023-07-02 DIAGNOSIS — J9601 Acute respiratory failure with hypoxia: Secondary | ICD-10-CM | POA: Diagnosis not present

## 2023-07-02 LAB — RENAL FUNCTION PANEL
Albumin: 2.3 g/dL — ABNORMAL LOW (ref 3.5–5.0)
Anion gap: 11 (ref 5–15)
BUN: 63 mg/dL — ABNORMAL HIGH (ref 8–23)
CO2: 26 mmol/L (ref 22–32)
Calcium: 8.6 mg/dL — ABNORMAL LOW (ref 8.9–10.3)
Chloride: 100 mmol/L (ref 98–111)
Creatinine, Ser: 4.02 mg/dL — ABNORMAL HIGH (ref 0.61–1.24)
GFR, Estimated: 15 mL/min — ABNORMAL LOW (ref >60)
Glucose, Bld: 115 mg/dL — ABNORMAL HIGH (ref 70–99)
Phosphorus: 4.2 mg/dL (ref 2.5–4.6)
Potassium: 4 mmol/L (ref 3.5–5.1)
Sodium: 137 mmol/L (ref 135–145)

## 2023-07-02 LAB — CBC
HCT: 22.4 % — ABNORMAL LOW (ref 39.0–52.0)
Hemoglobin: 7.3 g/dL — ABNORMAL LOW (ref 13.0–17.0)
MCH: 30.5 pg (ref 26.0–34.0)
MCHC: 32.6 g/dL (ref 30.0–36.0)
MCV: 93.7 fL (ref 80.0–100.0)
Platelets: 74 10*3/uL — ABNORMAL LOW (ref 150–400)
RBC: 2.39 MIL/uL — ABNORMAL LOW (ref 4.22–5.81)
RDW: 15.4 % (ref 11.5–15.5)
WBC: 14.8 10*3/uL — ABNORMAL HIGH (ref 4.0–10.5)
nRBC: 0 % (ref 0.0–0.2)

## 2023-07-02 LAB — GLUCOSE, CAPILLARY
Glucose-Capillary: 101 mg/dL — ABNORMAL HIGH (ref 70–99)
Glucose-Capillary: 143 mg/dL — ABNORMAL HIGH (ref 70–99)
Glucose-Capillary: 153 mg/dL — ABNORMAL HIGH (ref 70–99)
Glucose-Capillary: 163 mg/dL — ABNORMAL HIGH (ref 70–99)
Glucose-Capillary: 181 mg/dL — ABNORMAL HIGH (ref 70–99)
Glucose-Capillary: 187 mg/dL — ABNORMAL HIGH (ref 70–99)
Glucose-Capillary: 201 mg/dL — ABNORMAL HIGH (ref 70–99)

## 2023-07-02 MED ORDER — TRAZODONE HCL 50 MG PO TABS
25.0000 mg | ORAL_TABLET | Freq: Once | ORAL | Status: AC | PRN
Start: 2023-07-02 — End: 2023-07-02
  Administered 2023-07-02: 25 mg via ORAL
  Filled 2023-07-02: qty 1

## 2023-07-02 MED ORDER — CHLORHEXIDINE GLUCONATE CLOTH 2 % EX PADS
6.0000 | MEDICATED_PAD | Freq: Every day | CUTANEOUS | Status: DC
  Administered 2023-07-03 – 2023-07-06 (×3): 6 via TOPICAL

## 2023-07-02 MED ORDER — MIDODRINE HCL 5 MG PO TABS
5.0000 mg | ORAL_TABLET | Freq: Two times a day (BID) | ORAL | Status: DC
  Administered 2023-07-04: 5 mg via ORAL
  Filled 2023-07-02: qty 1

## 2023-07-02 NOTE — Progress Notes (Signed)
 [facility] Progress Note   Subjective:   Seen in room - overall much improved. Still with DOE and cough, hasn't gotten out of bed yet. We discussed plan for HD tomorrow and he is agreeable.  Objective Vitals:   07/01/23 1900 07/02/23 0025 07/02/23 0340 07/02/23 0700  BP: (!) 142/79 (!) 147/69 130/66 (!) 144/88  Pulse: 79 62 75   Resp: 20 18 18 14   Temp: 98.4 F (36.9 C) 98.5 F (36.9 C) 98.5 F (36.9 C) (!) 97.4 F (36.3 C)  TempSrc: Oral Oral Oral Axillary  SpO2: 95% 95% 93%   Weight:      Height:       Physical Exam General: Well appearing man, NAD. Nasal O2 in place Heart: RRR; no murmur Lungs: Scattered rales Abdomen: soft Extremities: no LE edema Dialysis Access:  TDC in R chest  Additional Objective Labs: Basic Metabolic Panel: Recent Labs  Lab 06/29/23 1220 06/29/23 1225 06/30/23 1428 07/02/23 0712  NA 136 136  136 135 137  K 3.9 3.9  3.9 4.3 4.0  CL 103 101 99 100  CO2 22  --  23 26  GLUCOSE 96 90 119* 115*  BUN 83* 84* 45* 63*  CREATININE 4.87* 4.70* 3.18* 4.02*  CALCIUM  7.1*  --  7.7* 8.6*  PHOS  --   --  4.4 4.2   Liver Function Tests: Recent Labs  Lab 06/29/23 1220 06/30/23 1428 07/02/23 0712  AST 20  --   --   ALT 9  --   --   ALKPHOS 154*  --   --   BILITOT 0.5  --   --   PROT 4.8*  --   --   ALBUMIN  2.2* 2.3* 2.3*   Recent Labs  Lab 06/29/23 1232  LIPASE 28   CBC: Recent Labs  Lab 06/29/23 1220 06/29/23 1225 06/30/23 1428 07/02/23 0712  WBC 1.5*  --  9.3 14.8*  NEUTROABS 0.5*  --   --   --   HGB 8.8* 8.5*  8.5* 7.7* 7.3*  HCT 26.6* 25.0*  25.0* 22.7* 22.4*  MCV 92.0  --  90.1 93.7  PLT 131*  --  86* 74*   Blood Culture    Component Value Date/Time   SDES BLOOD RIGHT WRIST 06/29/2023 1832   SPECREQUEST  06/29/2023 1832    BOTTLES DRAWN AEROBIC AND ANAEROBIC Blood Culture results may not be optimal due to an inadequate volume of blood received in culture bottles   CULT  06/29/2023 1832    NO  GROWTH 3 DAYS Performed at [facility], [address] [place], [place] [address]    REPTSTATUS PENDING 06/29/2023 1832   Medications:  cefTRIAXone  (ROCEPHIN )  IV Stopped (07/01/23 0816)    (feeding supplement) PROSource Plus  30 mL Oral BID BM   atorvastatin   40 mg Oral Daily   azithromycin   500 mg Oral Daily   budesonide -glycopyrrolate -formoterol   2 puff Inhalation BID   Chlorhexidine  Gluconate Cloth  6 each Topical Q0600   darbepoetin (ARANESP ) injection - DIALYSIS  40 mcg Subcutaneous Q Sat-1800   furosemide   40 mg Oral Daily   heparin   5,000 Units Subcutaneous Q8H   levothyroxine   175 mcg Oral QAC breakfast   melatonin  10 mg Oral QHS   midodrine   10 mg Oral Q8H   pantoprazole   40 mg Oral Daily   predniSONE   20 mg Oral Q breakfast   sodium chloride  flush  3 mL Intravenous Q12H  Dialysis Orders TTS - [facility] ->started HD 6/19, only 1 HD there so far 3 -> 4hr, 300 -> 400 BFR, EDW 78kg, 2K/2Ca bath, TDC - no meds so far   Assessment/Plan: AHRF: Pulm edema + COPD + pneumonia, on abx and O2, improving with volume off with HD.  LLL pneumonia: On Ceftriaxone  + azithromycin . He has been immunosuppressed, consider atypical agents if not improving. 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 Usual TTS schedule. Continue Lasix   daily for now. In effort to continue to get volume down, will plan HD tomorrow (Mon 6/23). HTN/volume: Off antihypertensives, midodrine  started - BP actually high today - will add holding parameters, may be able to wean off. Anemia of ESRD: Hgb 7.3 - Aranesp  given 6/21 (1st dose). WBC higher, Plts dropping. Secondary HPTH: CorrCa, Phos ok - no binder or VDRA for now. Nutrition: Continue renal diet, continue supplements. 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 Rituximan #2 05/30/23. pANCA titer 1:320 5/6 with UPC 300mg /g.  Decision  not to offer more Rituximab  given complications with steroid cream jitteriness, fungal infection, puffy face and hyperglycemia requiring insulin  as well as BUN/Cr >112/7.4 on 6/4. Prednisone  was lowered to 20 mg and Bactrim  discontinued.      Izetta Boehringer, PA-C 07/02/2023, 9:29 AM  [employer]

## 2023-07-02 NOTE — Plan of Care (Signed)
  Problem: Education: Goal: Ability to describe self-care measures that may prevent or decrease complications (Diabetes Survival Skills Education) will improve Outcome: Not Progressing Goal: Individualized Educational Video(s) Outcome: Not Progressing   Problem: Coping: Goal: Ability to adjust to condition or change in health will improve Outcome: Not Progressing   Problem: Fluid Volume: Goal: Ability to maintain a balanced intake and output will improve Outcome: Not Progressing   Problem: Health Behavior/Discharge Planning: Goal: Ability to identify and utilize available resources and services will improve Outcome: Not Progressing Goal: Ability to manage health-related needs will improve Outcome: Not Progressing   Problem: Metabolic: Goal: Ability to maintain appropriate glucose levels will improve Outcome: Not Progressing   Problem: Nutritional: Goal: Maintenance of adequate nutrition will improve Outcome: Not Progressing Goal: Progress toward achieving an optimal weight will improve Outcome: Not Progressing   Problem: Skin Integrity: Goal: Risk for impaired skin integrity will decrease Outcome: Not Progressing   Problem: Tissue Perfusion: Goal: Adequacy of tissue perfusion will improve Outcome: Not Progressing   Problem: Activity: Goal: Ability to tolerate increased activity will improve Outcome: Not Progressing   Problem: Clinical Measurements: Goal: Ability to maintain a body temperature in the normal range will improve Outcome: Not Progressing   Problem: Respiratory: Goal: Ability to maintain adequate ventilation will improve Outcome: Not Progressing Goal: Ability to maintain a clear airway will improve Outcome: Not Progressing

## 2023-07-02 NOTE — Progress Notes (Signed)
 PROGRESS NOTE  Walter Wilkerson FMW:[id number] DOB: 23-Jan-1950 DOA: 06/29/2023 PCP: Antoniette Vermell CROME, PA-C   LOS: 3 days   Brief Narrative / Interim history: 73 year old male with HTN, hypothyroidism, colon cancer s/p resection, anemia, thrombocytopenia, thoracic aortic aneurysm, newly diagnosed ESRD just darted on dialysis comes into the hospital with shortness of breath during his first dialysis episode.  He tells me he has noticed fluid overload in his legs, and increased shortness of breath even before starting HD.  Upon presentation in the HD unit, while having dialysis, has complained of persistent shortness of breath and EMS was called and the session was stopped.  He was hypoxic and brought to the hospital.  He was initially placed on BiPAP.  Imaging showed evidence of fluid overload and possible left lower lobe pneumonia versus consolidative atelectasis.  Nephrology was consulted and he was admitted to the hospital and emergently placed on HD  Subjective / 24h Interval events: Overall feeling much better, his breathing is better.  Has been able to walk to the bathroom and back but has not done increased distances  Assesement and Plan: Principal Problem:   Acute respiratory failure with hypoxia (HCC) Active Problems:   Acquired hypothyroidism   Essential hypertension   Chronic pain syndrome   History of lung cancer   Thoracic aortic aneurysm 40 mm very of CT from 2024   Thrombocytopenia with normocytic iron  deficiency anemia   Stage 4 chronic kidney disease (HCC)  Principal problem Acute hypoxemic respiratory failure in the setting of acute pulmonary edema/volume overload in the setting of ESRD -patient just started on dialysis, imaging on admission showed significant fluid overload with crackles on exam, tachypnea and increased work of breathing. - Received HD x 2, respiratory status improving  Active problems ESRD, progressive glomerulonephritis -thought to be triggered by  chemotherapy in the past.  He is on steroids as an outpatient, continue.  Nephrology consulted.  The Allises as above  Possible left lower lobe CAP -CT scan on admission with dense left lower lobe consolidation with air bronchograms, severe pneumonia versus consolidative atelectasis.  He did have a cough but no fevers.  After dialysis tomorrow we will repeat a two-view chest x-ray to determine severity of the pneumonia or if it is resolved  Hypotension -requiring midodrine  prior to dialysis, was placed on midodrine  however is becoming slightly more hypertensive.  Cut back down on the dose today  Pancytopenia-overall stable, continue to monitor  Essential hypertension-backoff on midodrine  today  Hypothyroidism-continue home Synthroid   History of lung cancer-status post resection   Scheduled Meds:  (feeding supplement) PROSource Plus  30 mL Oral BID BM   atorvastatin   40 mg Oral Daily   azithromycin   500 mg Oral Daily   budesonide -glycopyrrolate -formoterol   2 puff Inhalation BID   Chlorhexidine  Gluconate Cloth  6 each Topical Q0600   darbepoetin (ARANESP ) injection - DIALYSIS  40 mcg Subcutaneous Q Sat-1800   furosemide   40 mg Oral Daily   heparin   5,000 Units Subcutaneous Q8H   levothyroxine   175 mcg Oral QAC breakfast   melatonin  10 mg Oral QHS   midodrine   10 mg Oral Q8H   pantoprazole   40 mg Oral Daily   predniSONE   20 mg Oral Q breakfast   sodium chloride  flush  3 mL Intravenous Q12H   Continuous Infusions:  cefTRIAXone  (ROCEPHIN )  IV Stopped (07/01/23 0816)   PRN Meds:.acetaminophen  **OR** acetaminophen , ipratropium-albuterol , polyethylene glycol  Current Outpatient Medications  Medication Instructions   albuterol  (VENTOLIN  HFA) 108 (  90 Base) MCG/ACT inhaler 2 puffs, Inhalation, Every 4 hours PRN   amLODipine  (NORVASC ) 10 mg, Oral, Every morning   aspirin  EC 81 mg, Oral, Daily   atorvastatin  (LIPITOR) 40 mg, Oral, Daily   carvedilol  (COREG ) 6.25 mg, Oral, 2 times daily  with meals   clindamycin (CLEOCIN T) 1 % external solution 1 Application, Topical, 2 times daily PRN   clobetasol cream (TEMOVATE) 0.05 % 1 Application, Topical, 2 times daily PRN   Continuous Glucose Sensor (FREESTYLE LIBRE 3 SENSOR) MISC 1 each, Does not apply, Every 14 days, Please apply for 14 days and then switch to new sensor   cyanocobalamin  (VITAMIN B12) 1,000 mcg, Oral, Daily   DULoxetine  (CYMBALTA ) 30 mg, Oral, Daily   Fluticasone -Umeclidin-Vilant (TRELEGY ELLIPTA ) 100-62.5-25 MCG/ACT AEPB 1 puff, Inhalation, Daily   Glucagon  (GVOKE HYPOPEN  2-PACK) 1 MG/0.2ML SOAJ Inject 1mg  pen into outer thigh or lower abdomen if blood sugar less than 54.   insulin  glargine, 1 Unit Dial, (TOUJEO  SOLOSTAR) 300 UNIT/ML Solostar Pen Increase by 2 units every 5 days until fasting sugars 90-120. Max daily dosage 20 units   levothyroxine  (SYNTHROID ) 175 mcg, Oral, Daily before breakfast   Melatonin 10 mg, Oral, Daily at bedtime   pantoprazole  (PROTONIX ) 40 mg, Oral, Daily   predniSONE  (DELTASONE ) 20 mg, Oral, Daily with breakfast   sodium bicarbonate  650 mg, Oral, 3 times daily   Vitamin D  (Ergocalciferol ) (DRISDOL ) 50,000 Units, Oral, Every 7 days    Diet Orders (From admission, onward)     Start     Ordered   06/30/23 0937  Diet renal with fluid restriction Fluid restriction: 1200 mL Fluid; Room service appropriate? Yes; Fluid consistency: Thin  Diet effective now       Question Answer Comment  Fluid restriction: 1200 mL Fluid   Room service appropriate? Yes   Fluid consistency: Thin      06/30/23 0936            DVT prophylaxis: heparin  injection 5,000 Units Start: 06/29/23 2200   Lab Results  Component Value Date   PLT 74 (L) 07/02/2023      Code Status: Full Code  Family Communication: No family at bedside  Status is: Inpatient Remains inpatient appropriate because: Severity of illness   Level of care: Progressive  Consultants:  Nephrology  Objective: Vitals:    07/01/23 1900 07/02/23 0025 07/02/23 0340 07/02/23 0700  BP: (!) 142/79 (!) 147/69 130/66 (!) 144/88  Pulse: 79 62 75   Resp: 20 18 18 14   Temp: 98.4 F (36.9 C) 98.5 F (36.9 C) 98.5 F (36.9 C) (!) 97.4 F (36.3 C)  TempSrc: Oral Oral Oral Axillary  SpO2: 95% 95% 93%   Weight:      Height:        Intake/Output Summary (Last 24 hours) at 07/02/2023 1046 Last data filed at 07/02/2023 0030 Gross per 24 hour  Intake 100 ml  Output 500 ml  Net -400 ml   Wt Readings from Last 3 Encounters:  06/29/23 78.5 kg  06/27/23 79.4 kg  06/22/23 79.1 kg    Examination:  Constitutional: NAD Eyes: lids and conjunctivae normal, no scleral icterus ENMT: mmm Neck: normal, supple Respiratory: Rhonchi left lower lung field, otherwise clear.  No wheezing Cardiovascular: Regular rate and rhythm, no murmurs / rubs / gallops.  Trace LE edema. Abdomen: soft, no distention, no tenderness. Bowel sounds positive.    Data Reviewed: I have independently reviewed following labs and imaging studies   CBC  Recent Labs  Lab 06/27/23 0626 06/29/23 1220 06/29/23 1225 06/30/23 1428 07/02/23 0712  WBC  --  1.5*  --  9.3 14.8*  HGB 7.5* 8.8* 8.5*  8.5* 7.7* 7.3*  HCT 22.0* 26.6* 25.0*  25.0* 22.7* 22.4*  PLT  --  131*  --  86* 74*  MCV  --  92.0  --  90.1 93.7  MCH  --  30.4  --  30.6 30.5  MCHC  --  33.1  --  33.9 32.6  RDW  --  15.1  --  15.3 15.4  LYMPHSABS  --  0.7  --   --   --   MONOABS  --  0.1  --   --   --   EOSABS  --  0.1  --   --   --   BASOSABS  --  0.0  --   --   --     Recent Labs  Lab 06/27/23 0626 06/29/23 1220 06/29/23 1225 06/29/23 1503 06/29/23 1749 06/30/23 1428 07/02/23 0712  NA 138 136 136  136  --   --  135 137  K 3.8 3.9 3.9  3.9  --   --  4.3 4.0  CL 106 103 101  --   --  99 100  CO2  --  22  --   --   --  23 26  GLUCOSE 99 96 90  --   --  119* 115*  BUN 110* 83* 84*  --   --  45* 63*  CREATININE 6.80* 4.87* 4.70*  --   --  3.18* 4.02*  CALCIUM   --   7.1*  --   --   --  7.7* 8.6*  AST  --  20  --   --   --   --   --   ALT  --  9  --   --   --   --   --   ALKPHOS  --  154*  --   --   --   --   --   BILITOT  --  0.5  --   --   --   --   --   ALBUMIN   --  2.2*  --   --   --  2.3* 2.3*  PROCALCITON  --   --   --   --   --  101.91  --   LATICACIDVEN  --   --  2.8* 1.8  --   --   --   BNP  --   --   --   --  1,277.5*  --   --     ------------------------------------------------------------------------------------------------------------------ No results for input(s): CHOL, HDL, LDLCALC, TRIG, CHOLHDL, LDLDIRECT in the last 72 hours.  Lab Results  Component Value Date   HGBA1C 5.7 (H) 05/07/2023   ------------------------------------------------------------------------------------------------------------------ No results for input(s): TSH, T4TOTAL, T3FREE, THYROIDAB in the last 72 hours.  Invalid input(s): FREET3  Cardiac Enzymes No results for input(s): CKMB, TROPONINI, MYOGLOBIN in the last 168 hours.  Invalid input(s): CK ------------------------------------------------------------------------------------------------------------------    Component Value Date/Time   BNP 1,277.5 (H) 06/29/2023 1749    CBG: Recent Labs  Lab 07/01/23 1635 07/01/23 2024 07/02/23 0028 07/02/23 0339 07/02/23 0745  GLUCAP 161* 151* 153* 143* 101*    Recent Results (from the past 240 hours)  Resp panel by RT-PCR (RSV, Flu A&B, Covid) Anterior Nasal Swab     Status: None   Collection Time:  06/29/23 12:21 PM   Specimen: Anterior Nasal Swab  Result Value Ref Range Status   SARS Coronavirus 2 by RT PCR NEGATIVE NEGATIVE Final   Influenza A by PCR NEGATIVE NEGATIVE Final   Influenza B by PCR NEGATIVE NEGATIVE Final    Comment: (NOTE) The Xpert Xpress SARS-CoV-2/FLU/RSV plus assay is intended as an aid in the diagnosis of influenza from Nasopharyngeal swab specimens and should not be used as a sole basis for  treatment. Nasal washings and aspirates are unacceptable for Xpert Xpress SARS-CoV-2/FLU/RSV testing.  Fact Sheet for Patients: BloggerCourse.com  Fact Sheet for Healthcare Providers: SeriousBroker.it  This test is not yet approved or cleared by the [place]  FDA and has been authorized for detection and/or diagnosis of SARS-CoV-2 by FDA under an Emergency Use Authorization (EUA). This EUA will remain in effect (meaning this test can be used) for the duration of the COVID-19 declaration under Section 564(b)(1) of the Act, 21 U.S.C. section 360bbb-3(b)(1), unless the authorization is terminated or revoked.     Resp Syncytial Virus by PCR NEGATIVE NEGATIVE Final    Comment: (NOTE) Fact Sheet for Patients: BloggerCourse.com  Fact Sheet for Healthcare Providers: SeriousBroker.it  This test is not yet approved or cleared by the [place]  FDA and has been authorized for detection and/or diagnosis of SARS-CoV-2 by FDA under an Emergency Use Authorization (EUA). This EUA will remain in effect (meaning this test can be used) for the duration of the COVID-19 declaration under Section 564(b)(1) of the Act, 21 U.S.C. section 360bbb-3(b)(1), unless the authorization is terminated or revoked.  Performed at [facility], [address] [place], [place] [address]   Blood culture (routine x 2)     Status: None (Preliminary result)   Collection Time: 06/29/23  5:47 PM   Specimen: BLOOD RIGHT WRIST  Result Value Ref Range Status   Specimen Description BLOOD RIGHT WRIST  Final   Special Requests   Final    BOTTLES DRAWN AEROBIC AND ANAEROBIC Blood Culture adequate volume   Culture   Final    NO GROWTH 3 DAYS Performed at [facility], [address] [place], [place] [address]    Report Status PENDING  Incomplete  Blood culture (routine x 2)     Status: None  (Preliminary result)   Collection Time: 06/29/23  6:32 PM   Specimen: BLOOD RIGHT WRIST  Result Value Ref Range Status   Specimen Description BLOOD RIGHT WRIST  Final   Special Requests   Final    BOTTLES DRAWN AEROBIC AND ANAEROBIC Blood Culture results may not be optimal due to an inadequate volume of blood received in culture bottles   Culture   Final    NO GROWTH 3 DAYS Performed at [facility], [address], [place], [place] [address]    Report Status PENDING  Incomplete     Radiology Studies: No results found.    Nilda Fendt, MD, PhD [facility]  Between 7 am - 7 pm I am available, please contact me via Amion (for emergencies) or Securechat (non urgent messages)  Between 7 pm - 7 am I am not available, please contact night coverage MD/APP via Amion

## 2023-07-02 NOTE — Plan of Care (Signed)
  Problem: Education: Goal: Ability to describe self-care measures that may prevent or decrease complications (Diabetes Survival Skills Education) will improve 07/02/2023 0422 by Baldwin Doran BIRCH, RN Outcome: Not Progressing 07/02/2023 0419 by Baldwin Doran BIRCH, RN Outcome: Not Progressing Goal: Individualized Educational Video(s) 07/02/2023 0422 by Baldwin Doran BIRCH, RN Outcome: Not Progressing 07/02/2023 0419 by Baldwin Doran BIRCH, RN Outcome: Not Progressing   Problem: Coping: Goal: Ability to adjust to condition or change in health will improve 07/02/2023 0422 by Baldwin Doran BIRCH, RN Outcome: Not Progressing 07/02/2023 0419 by Baldwin Doran BIRCH, RN Outcome: Not Progressing   Problem: Fluid Volume: Goal: Ability to maintain a balanced intake and output will improve 07/02/2023 0422 by Baldwin Doran BIRCH, RN Outcome: Not Progressing 07/02/2023 0419 by Baldwin Doran BIRCH, RN Outcome: Not Progressing   Problem: Health Behavior/Discharge Planning: Goal: Ability to identify and utilize available resources and services will improve 07/02/2023 0422 by Baldwin Doran BIRCH, RN Outcome: Not Progressing 07/02/2023 0419 by Baldwin Doran BIRCH, RN Outcome: Not Progressing Goal: Ability to manage health-related needs will improve 07/02/2023 0422 by Baldwin Doran BIRCH, RN Outcome: Not Progressing 07/02/2023 0419 by Baldwin Doran BIRCH, RN Outcome: Not Progressing   Problem: Metabolic: Goal: Ability to maintain appropriate glucose levels will improve 07/02/2023 0422 by Baldwin Doran BIRCH, RN Outcome: Not Progressing 07/02/2023 0419 by Baldwin Doran BIRCH, RN Outcome: Not Progressing   Problem: Nutritional: Goal: Maintenance of adequate nutrition will improve 07/02/2023 0422 by Baldwin Doran BIRCH, RN Outcome: Not Progressing 07/02/2023 0419 by Baldwin Doran BIRCH, RN Outcome: Not Progressing Goal: Progress toward achieving an optimal weight will improve 07/02/2023 0422 by Baldwin Doran BIRCH,  RN Outcome: Not Progressing 07/02/2023 0419 by Baldwin Doran BIRCH, RN Outcome: Not Progressing   Problem: Skin Integrity: Goal: Risk for impaired skin integrity will decrease 07/02/2023 0422 by Baldwin Doran BIRCH, RN Outcome: Not Progressing 07/02/2023 0419 by Baldwin Doran BIRCH, RN Outcome: Not Progressing   Problem: Tissue Perfusion: Goal: Adequacy of tissue perfusion will improve 07/02/2023 0422 by Baldwin Doran BIRCH, RN Outcome: Not Progressing 07/02/2023 0419 by Baldwin Doran BIRCH, RN Outcome: Not Progressing   Problem: Activity: Goal: Ability to tolerate increased activity will improve 07/02/2023 0422 by Baldwin Doran BIRCH, RN Outcome: Not Progressing 07/02/2023 0419 by Baldwin Doran BIRCH, RN Outcome: Not Progressing   Problem: Clinical Measurements: Goal: Ability to maintain a body temperature in the normal range will improve 07/02/2023 0422 by Baldwin Doran BIRCH, RN Outcome: Not Progressing 07/02/2023 0419 by Baldwin Doran BIRCH, RN Outcome: Not Progressing   Problem: Respiratory: Goal: Ability to maintain adequate ventilation will improve 07/02/2023 0422 by Baldwin Doran BIRCH, RN Outcome: Not Progressing 07/02/2023 0419 by Baldwin Doran BIRCH, RN Outcome: Not Progressing Goal: Ability to maintain a clear airway will improve 07/02/2023 0422 by Baldwin Doran BIRCH, RN Outcome: Not Progressing 07/02/2023 0419 by Baldwin Doran BIRCH, RN Outcome: Not Progressing

## 2023-07-02 NOTE — Progress Notes (Signed)
 RT NOTE: PT has bipap PRN order. PT currently comfortable on 3L Altha with no increased WOB. Bipap not indicated at this time. Servo on standby.

## 2023-07-02 NOTE — Progress Notes (Signed)
 Ambulated with patient to evaluate oxygen saturation with activity.  Able to ambulate 200 feet with rolling walker and oxygen.  Assisted back to bed at end of ambulation.      07/02/23 1610 07/02/23 1612 07/02/23 1613  Oxygen Therapy  SpO2 95 % (!) 79 % (!) 84 %  O2 Device Nasal Cannula Nasal Cannula Nasal Cannula  O2 Flow Rate (L/min) 3 L/min 3 L/min 3 L/min  Patient Activity (if Appropriate) In bed Ambulating Ambulating    07/02/23 1614 07/02/23 1615 07/02/23 1616  Oxygen Therapy  SpO2 95 % (!) (S)  89 % 96 %  O2 Device Nasal Cannula Room Air Nasal Cannula  O2 Flow Rate (L/min) 3 L/min  --  3 L/min  Patient Activity (if Appropriate) In bed  --  In bed

## 2023-07-02 NOTE — Plan of Care (Signed)
 Alert and oriented.  Ambulatory in room and in hallways with staff assistance.  Monitored oxygen saturations while ambulating.  Family visiting at bedside.  Problem: Education: Goal: Ability to describe self-care measures that may prevent or decrease complications (Diabetes Survival Skills Education) will improve Outcome: Progressing Goal: Individualized Educational Video(s) Outcome: Progressing   Problem: Coping: Goal: Ability to adjust to condition or change in health will improve Outcome: Progressing   Problem: Fluid Volume: Goal: Ability to maintain a balanced intake and output will improve Outcome: Progressing   Problem: Health Behavior/Discharge Planning: Goal: Ability to identify and utilize available resources and services will improve Outcome: Progressing Goal: Ability to manage health-related needs will improve Outcome: Progressing

## 2023-07-03 ENCOUNTER — Inpatient Hospital Stay (HOSPITAL_COMMUNITY)

## 2023-07-03 DIAGNOSIS — Z95828 Presence of other vascular implants and grafts: Secondary | ICD-10-CM

## 2023-07-03 DIAGNOSIS — J9601 Acute respiratory failure with hypoxia: Secondary | ICD-10-CM | POA: Diagnosis not present

## 2023-07-03 DIAGNOSIS — N186 End stage renal disease: Secondary | ICD-10-CM

## 2023-07-03 DIAGNOSIS — Z992 Dependence on renal dialysis: Secondary | ICD-10-CM

## 2023-07-03 LAB — RENAL FUNCTION PANEL
Albumin: 2.5 g/dL — ABNORMAL LOW (ref 3.5–5.0)
Anion gap: 15 (ref 5–15)
BUN: 86 mg/dL — ABNORMAL HIGH (ref 8–23)
CO2: 22 mmol/L (ref 22–32)
Calcium: 8 mg/dL — ABNORMAL LOW (ref 8.9–10.3)
Chloride: 100 mmol/L (ref 98–111)
Creatinine, Ser: 4.7 mg/dL — ABNORMAL HIGH (ref 0.61–1.24)
GFR, Estimated: 12 mL/min — ABNORMAL LOW (ref >60)
Glucose, Bld: 142 mg/dL — ABNORMAL HIGH (ref 70–99)
Phosphorus: 3.1 mg/dL (ref 2.5–4.6)
Potassium: 3.3 mmol/L — ABNORMAL LOW (ref 3.5–5.1)
Sodium: 137 mmol/L (ref 135–145)

## 2023-07-03 LAB — GLUCOSE, CAPILLARY
Glucose-Capillary: 164 mg/dL — ABNORMAL HIGH (ref 70–99)
Glucose-Capillary: 95 mg/dL (ref 70–99)
Glucose-Capillary: 173 mg/dL — ABNORMAL HIGH (ref 70–99)
Glucose-Capillary: 152 mg/dL — ABNORMAL HIGH (ref 70–99)
Glucose-Capillary: 158 mg/dL — ABNORMAL HIGH (ref 70–99)

## 2023-07-03 LAB — CBC
HCT: 25.9 % — ABNORMAL LOW (ref 39.0–52.0)
Hemoglobin: 8.3 g/dL — ABNORMAL LOW (ref 13.0–17.0)
MCH: 30.2 pg (ref 26.0–34.0)
MCHC: 32 g/dL (ref 30.0–36.0)
MCV: 94.2 fL (ref 80.0–100.0)
Platelets: 93 10*3/uL — ABNORMAL LOW (ref 150–400)
RBC: 2.75 MIL/uL — ABNORMAL LOW (ref 4.22–5.81)
RDW: 15 % (ref 11.5–15.5)
WBC: 11.4 10*3/uL — ABNORMAL HIGH (ref 4.0–10.5)
nRBC: 0.2 % (ref 0.0–0.2)

## 2023-07-03 LAB — LEGIONELLA PNEUMOPHILA SEROGP 1 UR AG: L. pneumophila Serogp 1 Ur Ag: NEGATIVE

## 2023-07-03 MED ORDER — ALTEPLASE 2 MG IJ SOLR: 2.0000 mg | Freq: Once | INTRAMUSCULAR | Status: DC | PRN

## 2023-07-03 MED ORDER — PENTAFLUOROPROP-TETRAFLUOROETH EX AERO: 1.0000 | INHALATION_SPRAY | CUTANEOUS | Status: DC | PRN

## 2023-07-03 MED ORDER — LIDOCAINE-PRILOCAINE 2.5-2.5 % EX CREA: 1.0000 | TOPICAL_CREAM | CUTANEOUS | Status: DC | PRN

## 2023-07-03 MED ORDER — ANTICOAGULANT SODIUM CITRATE 4% (200MG/5ML) IV SOLN: 5.0000 mL | Status: DC | PRN

## 2023-07-03 MED ORDER — LIDOCAINE HCL (PF) 1 % IJ SOLN: 5.0000 mL | INTRAMUSCULAR | Status: DC | PRN

## 2023-07-03 MED ORDER — HEPARIN SODIUM (PORCINE) 1000 UNIT/ML IJ SOLN
3800.0000 [IU] | Freq: Once | INTRAMUSCULAR | Status: AC
  Administered 2023-07-03: 3200 [IU]

## 2023-07-03 MED ORDER — HEPARIN SODIUM (PORCINE) 1000 UNIT/ML DIALYSIS: 1000.0000 [IU] | INTRAMUSCULAR | Status: DC | PRN

## 2023-07-03 NOTE — Progress Notes (Addendum)
 Progress Note    07/03/2023 9:39 AM * No surgery found *  Subjective:  patient eager to get permanent dialysis access, unsure if he wants PD vs HD. He has strong desire to do at home dialysis because he likes to travel a lot. Has not made a decision with Nephrologist regarding which option is best for him   Vitals:   07/03/23 0730 07/03/23 0803  BP: (!) 149/81   Pulse: 73   Resp: 17   Temp: 98.2 F (36.8 C)   SpO2: 94% 99%   Physical Exam: Cardiac:  regular; right internal jugular TDC Lungs:  non labored Extremities:  2+ radial pulses bilaterally, hands warm and well perfused Abdomen:  soft, non tender Neurologic: alert and oriented  CBC    Component Value Date/Time   WBC 14.8 (H) 07/02/2023 0712   RBC 2.39 (L) 07/02/2023 0712   HGB 7.3 (L) 07/02/2023 0712   HGB 11.0 (L) 05/19/2023 1026   HCT 22.4 (L) 07/02/2023 0712   HCT 34.1 (L) 05/19/2023 1026   PLT 74 (L) 07/02/2023 0712   PLT 112 (L) 05/19/2023 1026   MCV 93.7 07/02/2023 0712   MCV 92 05/19/2023 1026   MCH 30.5 07/02/2023 0712   MCHC 32.6 07/02/2023 0712   RDW 15.4 07/02/2023 0712   RDW 16.8 (H) 05/19/2023 1026   LYMPHSABS 0.7 06/29/2023 1220   LYMPHSABS 0.1 (L) 05/19/2023 1026   MONOABS 0.1 06/29/2023 1220   EOSABS 0.1 06/29/2023 1220   EOSABS 0.0 05/19/2023 1026   BASOSABS 0.0 06/29/2023 1220   BASOSABS 0.0 05/19/2023 1026    BMET    Component Value Date/Time   NA 137 07/02/2023 0712   NA 131 (L) 05/19/2023 1026   K 4.0 07/02/2023 0712   CL 100 07/02/2023 0712   CO2 26 07/02/2023 0712   GLUCOSE 115 (H) 07/02/2023 0712   BUN 63 (H) 07/02/2023 0712   BUN 119 (HH) 05/19/2023 1026   CREATININE 4.02 (H) 07/02/2023 0712   CREATININE 5.10 (H) 04/27/2023 1017   CREATININE 1.44 (H) 01/11/2022 0945   CALCIUM  8.6 (L) 07/02/2023 0712   GFRNONAA 15 (L) 07/02/2023 0712   GFRNONAA 11 (L) 04/27/2023 1017   GFRNONAA 72 03/04/2020 0000   GFRAA 83 03/04/2020 0000    INR    Component Value Date/Time    INR 1.1 05/02/2023 0941     Intake/Output Summary (Last 24 hours) at 07/03/2023 0939 Last data filed at 07/03/2023 9687 Gross per 24 hour  Intake 243 ml  Output 320 ml  Net -77 ml     Assessment/Plan:  73 y.o. male  with ESRD on HD via right internal jugular TDC that we placed last week on 6/17. Now requesting permanent dialysis access. However, he is unsure whether or not he would like to do PD vs home HD. Has strong desire to be able to dialyze at home and also travel. His vein mapping did not show adequate venous conduit for AV fistula so we discussed with patient that should he desire to go the HD route he would likely require a AV graft. He is right handed so this would be placed in the left upper extremity. There is OR availability on Thursday 07/04/23 pending patients choice in surgery. Recommend he discuss further with his Nephrologist best option for him prior to proceeding    Teretha Charlyne RIGGERS [facility] [phone] 07/03/2023 9:39 AM  I have seen and evaluated the patient. I agree with the PA note  as documented above.  73 year old male with ESRD that has a right IJ [facility] previously placed by Dr. Lanis.  We were consulted for permanent dialysis access.  I have discussed with Dr. Geralynn that we need input on whether patient is pursuing HD or PD.  Patient seems a bit confused but states his priority is traveling.  Vascular will follow.  Will make further plans once we know whether he is going to pursue HD or PD.  Lonni DOROTHA Gaskins, MD [facility] [place] Office: [phone]

## 2023-07-03 NOTE — Plan of Care (Signed)

## 2023-07-03 NOTE — Progress Notes (Signed)
 Received patient in bed to unit.  Alert and oriented.  Informed consent signed and in chart.   TX duration:3.5 hours  Patient tolerated well.  Transported back to the room  Alert, without acute distress.  Hand-off given to patient's nurse.   Access used: R internal jugular HD Cath Access issues: none  Total UF removed: 3L Medication(s) given: none   07/03/23 1852  Vitals  Temp (!) 97.5 F (36.4 C)  Temp Source Oral  BP Location Left Arm  BP Method Automatic  Patient Position (if appropriate) Lying  Pulse Rate 84  Pulse Rate Source Monitor  ECG Heart Rate 83  Resp (!) 26  Oxygen Therapy  SpO2 94 %  O2 Device Nasal Cannula  O2 Flow Rate (L/min) 3 L/min  During Treatment Monitoring  Duration of HD Treatment -hour(s) 3.5 hour(s)  HD Safety Checks Performed Yes  Intra-Hemodialysis Comments Tx completed  Dialysis Fluid Bolus Normal Saline  Bolus Amount (mL) 300 mL  Post Treatment  Dialyzer Clearance Lightly streaked  Liters Processed 83.9  Fluid Removed (mL) 3000 mL  Tolerated HD Treatment Yes  Hemodialysis Catheter Right Internal jugular  Placement Date/Time: 06/27/23 0756   Placed prior to admission: No  Serial / Lot #: [id number]  Expiration Date: 02/10/28  Verification by X-ray: Yes  Person Inserting LDA: Lanis  Orientation: Right  Access Location: Internal jugular  Site Condition No complications  Blue Lumen Status Flushed;Dead end cap in place;Heparin  locked  Red Lumen Status Heparin  locked;Dead end cap in place;Flushed  Purple Lumen Status Heparin  locked;N/A  Catheter fill solution Heparin  1000 units/ml  Catheter fill volume (Arterial) 1.6 cc  Catheter fill volume (Venous) 1.6  Dressing Type Transparent  Dressing Status Antimicrobial disc/dressing in place;Clean, Dry, Intact  Drainage Description None  Dressing Change Due 07/07/23  Post treatment catheter status Capped and Clamped     Camellia Brasil LPN [facility]

## 2023-07-03 NOTE — Progress Notes (Signed)
 [facility] Progress Note   Subjective:    Seen in room SOB much better after HD x 2 Still on [place] O2 Wants to get permanent access while here if possible Just started dialysis recently w/ TDC   Objective Vitals:   07/02/23 2314 07/03/23 0311 07/03/23 0730 07/03/23 0803  BP: (!) 153/78 (!) 152/83 (!) 149/81   Pulse: 71 70 73   Resp: 17 18 17    Temp: 98.4 F (36.9 C) 98.3 F (36.8 C) 98.2 F (36.8 C)   TempSrc: Oral Oral Oral   SpO2: 96% 96% 94% 99%  Weight:      Height:       Physical Exam General: Well appearing man, NAD. Nasal O2 in place Heart: RRR; no murmur Lungs: Scattered rales Abdomen: soft Extremities: no LE edema Dialysis Access:  TDC in R chest  Additional Objective Labs: Basic Metabolic Panel: Recent Labs  Lab 06/29/23 1220 06/29/23 1225 06/30/23 1428 07/02/23 0712  NA 136 136  136 135 137  K 3.9 3.9  3.9 4.3 4.0  CL 103 101 99 100  CO2 22  --  23 26  GLUCOSE 96 90 119* 115*  BUN 83* 84* 45* 63*  CREATININE 4.87* 4.70* 3.18* 4.02*  CALCIUM  7.1*  --  7.7* 8.6*  PHOS  --   --  4.4 4.2   Liver Function Tests: Recent Labs  Lab 06/29/23 1220 06/30/23 1428 07/02/23 0712  AST 20  --   --   ALT 9  --   --   ALKPHOS 154*  --   --   BILITOT 0.5  --   --   PROT 4.8*  --   --   ALBUMIN  2.2* 2.3* 2.3*   Recent Labs  Lab 06/29/23 1232  LIPASE 28   CBC: Recent Labs  Lab 06/29/23 1220 06/29/23 1225 06/30/23 1428 07/02/23 0712  WBC 1.5*  --  9.3 14.8*  NEUTROABS 0.5*  --   --   --   HGB 8.8* 8.5*  8.5* 7.7* 7.3*  HCT 26.6* 25.0*  25.0* 22.7* 22.4*  MCV 92.0  --  90.1 93.7  PLT 131*  --  86* 74*   Blood Culture    Component Value Date/Time   SDES BLOOD RIGHT WRIST 06/29/2023 1832   SPECREQUEST  06/29/2023 1832    BOTTLES DRAWN AEROBIC AND ANAEROBIC Blood Culture results may not be optimal due to an inadequate volume of blood received in culture bottles   CULT  06/29/2023 1832    NO GROWTH 4 DAYS Performed at  [facility], [address], [place], [place] [address]    REPTSTATUS PENDING 06/29/2023 1832   Medications:  cefTRIAXone  (ROCEPHIN )  IV Stopped (07/02/23 1900)    (feeding supplement) PROSource Plus  30 mL Oral BID BM   atorvastatin   40 mg Oral Daily   azithromycin   500 mg Oral Daily   budesonide -glycopyrrolate -formoterol   2 puff Inhalation BID   Chlorhexidine  Gluconate Cloth  6 each Topical Q0600   darbepoetin (ARANESP ) injection - DIALYSIS  40 mcg Subcutaneous Q Sat-1800   furosemide   40 mg Oral Daily   heparin   5,000 Units Subcutaneous Q8H   levothyroxine   175 mcg Oral QAC breakfast   melatonin  10 mg Oral QHS   midodrine   5 mg Oral BID WC   pantoprazole   40 mg Oral Daily   predniSONE   20 mg Oral Q breakfast   sodium chloride  flush  3 mL Intravenous Q12H  Dialysis Orders TTS - AF ->started HD 6/19, only 1 HD there so far 3 -> 4hr, 300 -> 400 BFR, EDW 78kg, 2K/2Ca bath, TDC - no meds so far   Assessment/Plan: AHRF: Pulm edema + COPD + pneumonia, on abx and O2, improving with volume off with HD x 2. Next HD today off schedule.  LLL pneumonia: On Ceftriaxone  + azithromycin . He has been immunosuppressed, consider atypical agents if not improving. ESRD: recent start on dialysis, ANCA vasculitis. Usual TTS schedule. HD today off schedule. Will consult [facility] concerning access, not sure PD vs HD just yet.  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 WBC higher, Plts dropping. Secondary HPTH: CorrCa, Phos ok - no binder or VDRA for now. Nutrition: Continue renal diet, continue supplements. 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 Rituximan #2 05/30/23. pANCA titer 1:320 5/6 with UPC 300mg /g.  Decision not to offer more Rituximab  given complications with steroid cream jitteriness,  fungal infection, puffy face and hyperglycemia requiring insulin  as well as BUN/Cr >112/7.4 on 6/4. Prednisone  was lowered to 20 mg and Bactrim  discontinued.    Myer Fret  MD  [facility] 07/03/2023, 9:24 AM

## 2023-07-03 NOTE — Progress Notes (Signed)
 RT NOTE: Bipap order is PRN. Servo in room on standby. PT on 3L [place] with sats of 92% with no distress or increased WOB. Bipap not indicated at this time.

## 2023-07-03 NOTE — Care Management Important Message (Signed)
 Important Message  Patient Details  Name: Walter Wilkerson MRN: [id number] Date of Birth: 08-Mar-1950   Important Message Given:  Yes - Medicare IM     Jon Cruel 07/03/2023, 1:01 PM

## 2023-07-03 NOTE — Plan of Care (Signed)
  Problem: Education: Goal: Ability to describe self-care measures that may prevent or decrease complications (Diabetes Survival Skills Education) will improve 07/03/2023 0448 by Baldwin Doran BIRCH, RN Outcome: Not Progressing 07/03/2023 0439 by Baldwin Doran BIRCH, RN Outcome: Progressing Goal: Individualized Educational Video(s) 07/03/2023 0448 by Baldwin Doran BIRCH, RN Outcome: Not Progressing 07/03/2023 0439 by Baldwin Doran BIRCH, RN Outcome: Progressing   Problem: Coping: Goal: Ability to adjust to condition or change in health will improve 07/03/2023 0448 by Baldwin Doran BIRCH, RN Outcome: Not Progressing 07/03/2023 0439 by Baldwin Doran BIRCH, RN Outcome: Progressing   Problem: Fluid Volume: Goal: Ability to maintain a balanced intake and output will improve 07/03/2023 0448 by Baldwin Doran BIRCH, RN Outcome: Not Progressing 07/03/2023 0439 by Baldwin Doran BIRCH, RN Outcome: Progressing   Problem: Health Behavior/Discharge Planning: Goal: Ability to identify and utilize available resources and services will improve 07/03/2023 0448 by Baldwin Doran BIRCH, RN Outcome: Not Progressing 07/03/2023 0439 by Baldwin Doran BIRCH, RN Outcome: Progressing Goal: Ability to manage health-related needs will improve 07/03/2023 0448 by Baldwin Doran BIRCH, RN Outcome: Not Progressing 07/03/2023 0439 by Baldwin Doran BIRCH, RN Outcome: Progressing   Problem: Metabolic: Goal: Ability to maintain appropriate glucose levels will improve 07/03/2023 0448 by Baldwin Doran BIRCH, RN Outcome: Not Progressing 07/03/2023 0439 by Baldwin Doran BIRCH, RN Outcome: Progressing   Problem: Nutritional: Goal: Maintenance of adequate nutrition will improve 07/03/2023 0448 by Baldwin Doran BIRCH, RN Outcome: Not Progressing 07/03/2023 0439 by Baldwin Doran BIRCH, RN Outcome: Progressing Goal: Progress toward achieving an optimal weight will improve 07/03/2023 0448 by Baldwin Doran BIRCH, RN Outcome: Not  Progressing 07/03/2023 0439 by Baldwin Doran D, RN Outcome: Progressing   Problem: Skin Integrity: Goal: Risk for impaired skin integrity will decrease 07/03/2023 0448 by Baldwin Doran BIRCH, RN Outcome: Not Progressing 07/03/2023 0439 by Baldwin Doran D, RN Outcome: Progressing   Problem: Tissue Perfusion: Goal: Adequacy of tissue perfusion will improve 07/03/2023 0448 by Baldwin Doran BIRCH, RN Outcome: Not Progressing 07/03/2023 0439 by Baldwin Doran BIRCH, RN Outcome: Progressing   Problem: Activity: Goal: Ability to tolerate increased activity will improve 07/03/2023 0448 by Baldwin Doran BIRCH, RN Outcome: Not Progressing 07/03/2023 0439 by Baldwin Doran BIRCH, RN Outcome: Progressing   Problem: Clinical Measurements: Goal: Ability to maintain a body temperature in the normal range will improve 07/03/2023 0448 by Baldwin Doran BIRCH, RN Outcome: Not Progressing 07/03/2023 0439 by Baldwin Doran BIRCH, RN Outcome: Progressing   Problem: Respiratory: Goal: Ability to maintain adequate ventilation will improve 07/03/2023 0448 by Baldwin Doran BIRCH, RN Outcome: Not Progressing 07/03/2023 0439 by Baldwin Doran BIRCH, RN Outcome: Progressing Goal: Ability to maintain a clear airway will improve 07/03/2023 0448 by Baldwin Doran BIRCH, RN Outcome: Not Progressing 07/03/2023 0439 by Baldwin Doran BIRCH, RN Outcome: Progressing

## 2023-07-03 NOTE — Plan of Care (Signed)
  Problem: Education: Goal: Ability to describe self-care measures that may prevent or decrease complications (Diabetes Survival Skills Education) will improve Outcome: Progressing Goal: Individualized Educational Video(s) Outcome: Progressing   Problem: Coping: Goal: Ability to adjust to condition or change in health will improve Outcome: Progressing   Problem: Fluid Volume: Goal: Ability to maintain a balanced intake and output will improve Outcome: Progressing   Problem: Health Behavior/Discharge Planning: Goal: Ability to identify and utilize available resources and services will improve Outcome: Progressing Goal: Ability to manage health-related needs will improve Outcome: Progressing   Problem: Metabolic: Goal: Ability to maintain appropriate glucose levels will improve Outcome: Progressing   Problem: Nutritional: Goal: Maintenance of adequate nutrition will improve Outcome: Progressing Goal: Progress toward achieving an optimal weight will improve Outcome: Progressing   Problem: Skin Integrity: Goal: Risk for impaired skin integrity will decrease Outcome: Progressing   Problem: Tissue Perfusion: Goal: Adequacy of tissue perfusion will improve Outcome: Progressing   Problem: Activity: Goal: Ability to tolerate increased activity will improve Outcome: Progressing   Problem: Clinical Measurements: Goal: Ability to maintain a body temperature in the normal range will improve Outcome: Progressing   Problem: Respiratory: Goal: Ability to maintain adequate ventilation will improve Outcome: Progressing Goal: Ability to maintain a clear airway will improve Outcome: Progressing

## 2023-07-03 NOTE — Progress Notes (Signed)
 Arrived back from Hemodialysis.  Transported via bed and escorted by Dialysis Nurse Camellia, LPN .  Vitals obtained.    07/03/23 1931  Vitals  Temp 98.3 F (36.8 C)  Temp Source Oral  BP (!) 155/93  MAP (mmHg) 111  BP Location Left Arm  BP Method Automatic  Patient Position (if appropriate) Lying  Pulse Rate 87  Pulse Rate Source Monitor  ECG Heart Rate 87  Resp 18  Level of Consciousness  Level of Consciousness Alert  MEWS COLOR  MEWS Score Color Green  Oxygen Therapy  SpO2 92 %  O2 Device Nasal Cannula  O2 Flow Rate (L/min) 3 L/min  MEWS Score  MEWS Temp 0  MEWS Systolic 0  MEWS Pulse 0  MEWS RR 0  MEWS LOC 0  MEWS Score 0

## 2023-07-03 NOTE — Progress Notes (Signed)
 PROGRESS NOTE  Walter Wilkerson FMW:[id number] DOB: 01/16/1950 DOA: 06/29/2023 PCP: Antoniette Vermell CROME, PA-C   LOS: 4 days   Brief Narrative / Interim history: 73 year old male with HTN, hypothyroidism, colon cancer s/p resection, anemia, thrombocytopenia, thoracic aortic aneurysm, newly diagnosed ESRD just darted on dialysis comes into the hospital with shortness of breath during his first dialysis episode.  He tells me he has noticed fluid overload in his legs, and increased shortness of breath even before starting HD.  Upon presentation in the HD unit, while having dialysis, has complained of persistent shortness of breath and EMS was called and the session was stopped.  He was hypoxic and brought to the hospital.  He was initially placed on BiPAP.  Imaging showed evidence of fluid overload and possible left lower lobe pneumonia versus consolidative atelectasis.  Nephrology was consulted and he was admitted to the hospital and emergently placed on HD  Subjective / 24h Interval events: Continues to feel better.  Breathing better  Assesement and Plan: Principal Problem:   Acute respiratory failure with hypoxia (HCC) Active Problems:   Acquired hypothyroidism   Essential hypertension   Chronic pain syndrome   History of lung cancer   Thoracic aortic aneurysm 40 mm very of CT from 2024   Thrombocytopenia with normocytic iron  deficiency anemia   Stage 4 chronic kidney disease (HCC)  Principal problem Acute hypoxemic respiratory failure in the setting of acute pulmonary edema/volume overload in the setting of ESRD -patient just started on dialysis, imaging on admission showed significant fluid overload with crackles on exam, tachypnea and increased work of breathing. - Received HD x 2, respiratory status improving.  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  He is on steroids as  an outpatient, continue.  Nephrology consulted. Discussed with Dr Geralynn.  Will obtain vascular input for permanent access  Possible left lower lobe CAP -CT scan on admission with dense left lower lobe consolidation with air bronchograms, severe pneumonia versus consolidative atelectasis.  He did have a cough but no fevers.  Repeat chest x-ray today after HD  Hypotension -requiring midodrine  prior to dialysis, was placed on midodrine  however is becoming slightly more hypertensive.  Cut back down on the dose today  Pancytopenia-overall stable, continue to monitor  Essential hypertension-more hypertensive now, keep midodrine  today since he is going to have dialysis but likely discontinue tomorrow  Hypothyroidism-continue home Synthroid   History of lung cancer-status post resection   Scheduled Meds:  (feeding supplement) PROSource Plus  30 mL Oral BID BM   atorvastatin   40 mg Oral Daily   azithromycin   500 mg Oral Daily   budesonide -glycopyrrolate -formoterol   2 puff Inhalation BID   Chlorhexidine  Gluconate Cloth  6 each Topical Q0600   darbepoetin (ARANESP ) injection - DIALYSIS  40 mcg Subcutaneous Q Sat-1800   furosemide   40 mg Oral Daily   heparin   5,000 Units Subcutaneous Q8H   levothyroxine   175 mcg Oral QAC breakfast   melatonin  10 mg Oral QHS   midodrine   5 mg Oral BID WC   pantoprazole   40 mg Oral Daily   predniSONE   20 mg Oral Q breakfast   sodium chloride  flush  3 mL Intravenous Q12H   Continuous Infusions:  cefTRIAXone  (ROCEPHIN )  IV Stopped (07/02/23 1900)   PRN Meds:.acetaminophen  **OR** acetaminophen , ipratropium-albuterol , polyethylene glycol  Current Outpatient Medications  Medication Instructions   albuterol  (VENTOLIN  HFA) 108 (90 Base) MCG/ACT  inhaler 2 puffs, Inhalation, Every 4 hours PRN   amLODipine  (NORVASC ) 10 mg, Oral, Every morning   aspirin  EC 81 mg, Oral, Daily   atorvastatin  (LIPITOR) 40 mg, Oral, Daily   carvedilol  (COREG ) 6.25 mg, Oral, 2 times  daily with meals   clindamycin (CLEOCIN T) 1 % external solution 1 Application, Topical, 2 times daily PRN   clobetasol cream (TEMOVATE) 0.05 % 1 Application, Topical, 2 times daily PRN   Continuous Glucose Sensor (FREESTYLE LIBRE 3 SENSOR) MISC 1 each, Does not apply, Every 14 days, Please apply for 14 days and then switch to new sensor   cyanocobalamin  (VITAMIN B12) 1,000 mcg, Oral, Daily   DULoxetine  (CYMBALTA ) 30 mg, Oral, Daily   Fluticasone -Umeclidin-Vilant (TRELEGY ELLIPTA ) 100-62.5-25 MCG/ACT AEPB 1 puff, Inhalation, Daily   Glucagon  (GVOKE HYPOPEN  2-PACK) 1 MG/0.2ML SOAJ Inject 1mg  pen into outer thigh or lower abdomen if blood sugar less than 54.   insulin  glargine, 1 Unit Dial, (TOUJEO  SOLOSTAR) 300 UNIT/ML Solostar Pen Increase by 2 units every 5 days until fasting sugars 90-120. Max daily dosage 20 units   levothyroxine  (SYNTHROID ) 175 mcg, Oral, Daily before breakfast   Melatonin 10 mg, Oral, Daily at bedtime   pantoprazole  (PROTONIX ) 40 mg, Oral, Daily   predniSONE  (DELTASONE ) 20 mg, Oral, Daily with breakfast   sodium bicarbonate  650 mg, Oral, 3 times daily   Vitamin D  (Ergocalciferol ) (DRISDOL ) 50,000 Units, Oral, Every 7 days    Diet Orders (From admission, onward)     Start     Ordered   06/30/23 0937  Diet renal with fluid restriction Fluid restriction: 1200 mL Fluid; Room service appropriate? Yes; Fluid consistency: Thin  Diet effective now       Question Answer Comment  Fluid restriction: 1200 mL Fluid   Room service appropriate? Yes   Fluid consistency: Thin      06/30/23 0936            DVT prophylaxis: heparin  injection 5,000 Units Start: 06/29/23 2200   Lab Results  Component Value Date   PLT 74 (L) 07/02/2023      Code Status: Full Code  Family Communication: No family at bedside  Status is: Inpatient Remains inpatient appropriate because: Severity of illness   Level of care: Progressive  Consultants:  Nephrology  Objective: Vitals:    07/02/23 2314 07/03/23 0311 07/03/23 0730 07/03/23 0803  BP: (!) 153/78 (!) 152/83 (!) 149/81   Pulse: 71 70 73   Resp: 17 18 17    Temp: 98.4 F (36.9 C) 98.3 F (36.8 C) 98.2 F (36.8 C)   TempSrc: Oral Oral Oral   SpO2: 96% 96% 94% 99%  Weight:      Height:        Intake/Output Summary (Last 24 hours) at 07/03/2023 1023 Last data filed at 07/03/2023 9687 Gross per 24 hour  Intake 243 ml  Output 320 ml  Net -77 ml   Wt Readings from Last 3 Encounters:  06/29/23 78.5 kg  06/27/23 79.4 kg  06/22/23 79.1 kg    Examination: Constitutional: NAD Eyes: lids and conjunctivae normal, no scleral icterus ENMT: mmm Neck: normal, supple Respiratory: Rhonchi in the left lower lung, no wheezing and otherwise clear Cardiovascular: Regular rate and rhythm, no murmurs / rubs / gallops.  Trace LE edema. Abdomen: soft, no distention, no tenderness. Bowel sounds positive.    Data Reviewed: I have independently reviewed following labs and imaging studies   CBC Recent Labs  Lab 06/27/23 0626 06/29/23  1220 06/29/23 1225 06/30/23 1428 07/02/23 0712  WBC  --  1.5*  --  9.3 14.8*  HGB 7.5* 8.8* 8.5*  8.5* 7.7* 7.3*  HCT 22.0* 26.6* 25.0*  25.0* 22.7* 22.4*  PLT  --  131*  --  86* 74*  MCV  --  92.0  --  90.1 93.7  MCH  --  30.4  --  30.6 30.5  MCHC  --  33.1  --  33.9 32.6  RDW  --  15.1  --  15.3 15.4  LYMPHSABS  --  0.7  --   --   --   MONOABS  --  0.1  --   --   --   EOSABS  --  0.1  --   --   --   BASOSABS  --  0.0  --   --   --     Recent Labs  Lab 06/27/23 0626 06/29/23 1220 06/29/23 1225 06/29/23 1503 06/29/23 1749 06/30/23 1428 07/02/23 0712  NA 138 136 136  136  --   --  135 137  K 3.8 3.9 3.9  3.9  --   --  4.3 4.0  CL 106 103 101  --   --  99 100  CO2  --  22  --   --   --  23 26  GLUCOSE 99 96 90  --   --  119* 115*  BUN 110* 83* 84*  --   --  45* 63*  CREATININE 6.80* 4.87* 4.70*  --   --  3.18* 4.02*  CALCIUM   --  7.1*  --   --   --  7.7* 8.6*   AST  --  20  --   --   --   --   --   ALT  --  9  --   --   --   --   --   ALKPHOS  --  154*  --   --   --   --   --   BILITOT  --  0.5  --   --   --   --   --   ALBUMIN   --  2.2*  --   --   --  2.3* 2.3*  PROCALCITON  --   --   --   --   --  101.91  --   LATICACIDVEN  --   --  2.8* 1.8  --   --   --   BNP  --   --   --   --  1,277.5*  --   --     ------------------------------------------------------------------------------------------------------------------ No results for input(s): CHOL, HDL, LDLCALC, TRIG, CHOLHDL, LDLDIRECT in the last 72 hours.  Lab Results  Component Value Date   HGBA1C 5.7 (H) 05/07/2023   ------------------------------------------------------------------------------------------------------------------ No results for input(s): TSH, T4TOTAL, T3FREE, THYROIDAB in the last 72 hours.  Invalid input(s): FREET3  Cardiac Enzymes No results for input(s): CKMB, TROPONINI, MYOGLOBIN in the last 168 hours.  Invalid input(s): CK ------------------------------------------------------------------------------------------------------------------    Component Value Date/Time   BNP 1,277.5 (H) 06/29/2023 1749    CBG: Recent Labs  Lab 07/02/23 1627 07/02/23 2010 07/02/23 2316 07/03/23 0311 07/03/23 0743  GLUCAP 181* 201* 187* 164* 95    Recent Results (from the past 240 hours)  Resp panel by RT-PCR (RSV, Flu A&B, Covid) Anterior Nasal Swab     Status: None   Collection Time: 06/29/23 12:21 PM   Specimen: Anterior  Nasal Swab  Result Value Ref Range Status   SARS Coronavirus 2 by RT PCR NEGATIVE NEGATIVE Final   Influenza A by PCR NEGATIVE NEGATIVE Final   Influenza B by PCR NEGATIVE NEGATIVE Final    Comment: (NOTE) The Xpert Xpress SARS-CoV-2/FLU/RSV plus assay is intended as an aid in the diagnosis of influenza from Nasopharyngeal swab specimens and should not be used as a sole basis for treatment. Nasal washings  and aspirates are unacceptable for Xpert Xpress SARS-CoV-2/FLU/RSV testing.  Fact Sheet for Patients: BloggerCourse.com  Fact Sheet for Healthcare Providers: SeriousBroker.it  This test is not yet approved or cleared by the [place]  FDA and has been authorized for detection and/or diagnosis of SARS-CoV-2 by FDA under an Emergency Use Authorization (EUA). This EUA will remain in effect (meaning this test can be used) for the duration of the COVID-19 declaration under Section 564(b)(1) of the Act, 21 U.S.C. section 360bbb-3(b)(1), unless the authorization is terminated or revoked.     Resp Syncytial Virus by PCR NEGATIVE NEGATIVE Final    Comment: (NOTE) Fact Sheet for Patients: BloggerCourse.com  Fact Sheet for Healthcare Providers: SeriousBroker.it  This test is not yet approved or cleared by the [place]  FDA and has been authorized for detection and/or diagnosis of SARS-CoV-2 by FDA under an Emergency Use Authorization (EUA). This EUA will remain in effect (meaning this test can be used) for the duration of the COVID-19 declaration under Section 564(b)(1) of the Act, 21 U.S.C. section 360bbb-3(b)(1), unless the authorization is terminated or revoked.  Performed at [facility], [address], [place], [place] [address]   Blood culture (routine x 2)     Status: None (Preliminary result)   Collection Time: 06/29/23  5:47 PM   Specimen: BLOOD RIGHT WRIST  Result Value Ref Range Status   Specimen Description BLOOD RIGHT WRIST  Final   Special Requests   Final    BOTTLES DRAWN AEROBIC AND ANAEROBIC Blood Culture adequate volume   Culture   Final    NO GROWTH 4 DAYS Performed at [facility], [address], [place], [place] [address]    Report Status PENDING  Incomplete  Blood culture (routine x 2)     Status: None (Preliminary result)   Collection  Time: 06/29/23  6:32 PM   Specimen: BLOOD RIGHT WRIST  Result Value Ref Range Status   Specimen Description BLOOD RIGHT WRIST  Final   Special Requests   Final    BOTTLES DRAWN AEROBIC AND ANAEROBIC Blood Culture results may not be optimal due to an inadequate volume of blood received in culture bottles   Culture   Final    NO GROWTH 4 DAYS Performed at [facility], [address] [place], [place] [address]    Report Status PENDING  Incomplete     Radiology Studies: No results found.    Nilda Fendt, MD, PhD [facility]  Between 7 am - 7 pm I am available, please contact me via Amion (for emergencies) or Securechat (non urgent messages)  Between 7 pm - 7 am I am not available, please contact night coverage MD/APP via Amion

## 2023-07-03 NOTE — Progress Notes (Signed)
 Transported to Hemodialysis unit at this time for hemodialysis.  Transport method: bed  Accompanied by: Patient Transporter.

## 2023-07-04 ENCOUNTER — Inpatient Hospital Stay (HOSPITAL_COMMUNITY)

## 2023-07-04 DIAGNOSIS — J9601 Acute respiratory failure with hypoxia: Secondary | ICD-10-CM | POA: Diagnosis not present

## 2023-07-04 LAB — GLUCOSE, CAPILLARY
Glucose-Capillary: 120 mg/dL — ABNORMAL HIGH (ref 70–99)
Glucose-Capillary: 92 mg/dL (ref 70–99)
Glucose-Capillary: 132 mg/dL — ABNORMAL HIGH (ref 70–99)
Glucose-Capillary: 142 mg/dL — ABNORMAL HIGH (ref 70–99)
Glucose-Capillary: 150 mg/dL — ABNORMAL HIGH (ref 70–99)

## 2023-07-04 LAB — CULTURE, BLOOD (ROUTINE X 2)
Culture: NO GROWTH
Culture: NO GROWTH
Special Requests: ADEQUATE

## 2023-07-04 MED ORDER — CHLORHEXIDINE GLUCONATE CLOTH 2 % EX PADS
6.0000 | MEDICATED_PAD | Freq: Every day | CUTANEOUS | Status: DC
  Administered 2023-07-05: 6 via TOPICAL

## 2023-07-04 NOTE — Progress Notes (Signed)
 [facility] Progress Note   Subjective:    Seen in room Remains on [place] O2 Had long discussion about home HD vs in-center HD vs home PD He wants to talk to his wife about this before deciding Wt's are down here from 78kg --> 70 kg after 3 inpt HD sessions   Objective Vitals:   07/04/23 0749 07/04/23 0824 07/04/23 0834 07/04/23 1100  BP: (!) 140/78  123/71 132/70  Pulse: 96  83 79  Resp:      Temp: 98.5 F (36.9 C)  98.1 F (36.7 C)   TempSrc: Oral  Oral   SpO2: (!) 88% 90% 95% 94%  Weight:      Height:       Physical Exam General: NAD, [place] O2 3 L  Heart: RRR; no murmur Lungs: mostly clear now, rare crackles Abdomen: soft ntnd  Extremities: no LE edema Dialysis Access:  TDC in R chest  OP HD: TTS SW has had only 1 outpatient HD session on 6/19 (got 2 hrs, 2.5 ordered) 3 -> 4h  B300 -> 400     78kg   2K  RIJ TDC  Heparin  none Hectorol 1 micrograms IV tts    Assessment/Plan: AHRF: Pulm edema + COPD + pneumonia, on abx and O2, improving with volume off with HD x 3. Down 8-9kg since admission. Down to 3 L [place]. F/u CXR shows improved LLL infiltrate, no edema.  LLL pneumonia: On Ceftriaxone  + azithromycin . He has been immunosuppressed. F/u CXR 6/23 looked improved. No edema in any of the CXR's.  ESRD: recent start on dialysis, TTS schedule. Had HD here last Thursday and Friday, and this Monday. Plan next HD tomorrow off schedule.  HD access: has TDC placed last week. Seen by [facility] yesterday. Had discussion today w/ pt about home HD vs in-center HD vs PD, he wants to d/w his wife. I told him [facility]'s note said that they have OR space this Thursday for access placement.  HTN/volume: Off antihypertensives, bp's are stable.  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 WBC higher, Plts dropping. Secondary HPTH: CorrCa, Phos ok - no binder or VDRA for now. Nutrition: Continue renal diet, continue  supplements. 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 Rituximan #2 05/30/23. pANCA titer 1:320 5/6 with UPC 300mg /g.  Decision not to offer more Rituximab  given complications with steroid cream jitteriness, fungal infection, puffy face and hyperglycemia requiring insulin  as well as advanced renal failure on 6/4. Prednisone  was lowered to 20 mg and Bactrim  discontinued.    Myer Fret  MD  [facility] 07/04/2023, 12:55 PM  Recent Labs  Lab 07/02/23 0712 07/03/23 1359  HGB 7.3* 8.3*  ALBUMIN  2.3* 2.5*  CALCIUM  8.6* 8.0*  PHOS 4.2 3.1  CREATININE 4.02* 4.70*  K 4.0 3.3*    Inpatient medications:  (feeding supplement) PROSource Plus  30 mL Oral BID BM   atorvastatin   40 mg Oral Daily   budesonide -glycopyrrolate -formoterol   2 puff Inhalation BID   Chlorhexidine  Gluconate Cloth  6 each Topical Q0600   darbepoetin (ARANESP ) injection - DIALYSIS  40 mcg Subcutaneous Q Sat-1800   furosemide   40 mg Oral Daily   heparin   5,000 Units Subcutaneous Q8H   levothyroxine   175 mcg Oral QAC breakfast   melatonin  10 mg Oral QHS  pantoprazole   40 mg Oral Daily   predniSONE   20 mg Oral Q breakfast   sodium chloride  flush  3 mL Intravenous Q12H    acetaminophen  **OR** acetaminophen , ipratropium-albuterol , polyethylene glycol

## 2023-07-04 NOTE — Progress Notes (Signed)
 Patient reported some blurred vision to bilateral peripheral vision with the left side becoming worse. 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 Rapid response RN notified. Dr. Trixie notified. Both came to bedside to evaluate. Right before going down for head CT, patient reports clear vision.  MRI ordered for patient but patient declining at this time, Dr. Trixie aware.

## 2023-07-04 NOTE — Progress Notes (Signed)
 PROGRESS NOTE  [facility] FMW:[id number] DOB: 1950/11/06 DOA: 06/29/2023 PCP: Antoniette Vermell CROME, PA-C   LOS: 5 days   Brief Narrative / Interim history: 73 year old male with HTN, hypothyroidism, colon cancer s/p resection, anemia, thrombocytopenia, thoracic aortic aneurysm, newly diagnosed ESRD just darted on dialysis comes into the hospital with shortness of breath during his first dialysis episode.  He tells me he has noticed fluid overload in his legs, and increased shortness of breath even before starting HD.  Upon presentation in the HD unit, while having dialysis, has complained of persistent shortness of breath and EMS was called and the session was stopped.  He was hypoxic and brought to the hospital.  He was initially placed on BiPAP.  Imaging showed evidence of fluid overload and possible left lower lobe pneumonia versus consolidative atelectasis.  Nephrology was consulted and he was admitted to the hospital and emergently placed on HD  Subjective / 24h Interval events: Felt more short of breath last night and had to go back on BiPAP between 1 AM and 6 AM.  Is off BiPAP this morning, on nasal cannula, feeling well.  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  He usually gets a headache afterwards.  Symptoms improving  Assesement and Plan: Principal Problem:   Acute respiratory failure with hypoxia (HCC) Active Problems:   Acquired hypothyroidism   Essential hypertension   Chronic pain syndrome   History of lung cancer   Thoracic aortic aneurysm 40 mm very of CT from 2024   Thrombocytopenia with normocytic iron  deficiency anemia   Stage 4 chronic kidney disease (HCC)  Principal problem Acute hypoxemic respiratory failure in the setting of acute pulmonary edema/volume overload in the setting of ESRD -patient just started on dialysis, imaging on  admission showed significant fluid overload with crackles on exam, tachypnea and increased work of breathing. 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  He is on steroids as an outpatient, continue.  Nephrology consulted. Discussed with Dr Geralynn.  Consulted for permanent access, however patient is not sure whether he wants IHD versus PD, he wants to travel more and is inclining towards PD.  He will discuss this further with his family and outpatient nephrologist at his next appointment  Possible left lower lobe CAP -CT scan on admission with dense left lower lobe consolidation with air bronchograms, severe pneumonia versus consolidative atelectasis.  He did have a cough but no fevers.  Repeat chest x-ray after 3 HD sessions has persistent infiltrates, suggesting true community-acquired pneumonia.  He is immunosuppressed, continue antibiotics  ?  Ocular migraines -patient gets episodes of left-sided blurry vision, followed by headache, to 3 times per year.  Had transient symptoms this morning, now resolved.  Obtain CT scan of the head, if completely negative may need an MRI as this was never evaluated before  Hypotension -requiring midodrine  prior to dialysis, was placed on midodrine  however is becoming slightly more hypertensive.  Discontinue midodrine  today  Pancytopenia-overall stable, continue to monitor  Essential hypertension-more hypertensive now, keep midodrine  today since he is going to have dialysis but likely discontinue tomorrow  Hypothyroidism-continue home  Synthroid   History of lung cancer-status post resection   Scheduled Meds:  (feeding supplement) PROSource Plus  30 mL Oral BID BM   atorvastatin    40 mg Oral Daily   budesonide -glycopyrrolate -formoterol   2 puff Inhalation BID   Chlorhexidine  Gluconate Cloth  6 each Topical Q0600   darbepoetin (ARANESP ) injection - DIALYSIS  40 mcg Subcutaneous Q Sat-1800   furosemide   40 mg Oral Daily   heparin   5,000 Units Subcutaneous Q8H   levothyroxine   175 mcg Oral QAC breakfast   melatonin  10 mg Oral QHS   midodrine   5 mg Oral BID WC   pantoprazole   40 mg Oral Daily   predniSONE   20 mg Oral Q breakfast   sodium chloride  flush  3 mL Intravenous Q12H   Continuous Infusions:   PRN Meds:.acetaminophen  **OR** acetaminophen , ipratropium-albuterol , polyethylene glycol  Current Outpatient Medications  Medication Instructions   albuterol  (VENTOLIN  HFA) 108 (90 Base) MCG/ACT inhaler 2 puffs, Inhalation, Every 4 hours PRN   amLODipine  (NORVASC ) 10 mg, Oral, Every morning   aspirin  EC 81 mg, Oral, Daily   atorvastatin  (LIPITOR) 40 mg, Oral, Daily   carvedilol  (COREG ) 6.25 mg, Oral, 2 times daily with meals   clindamycin (CLEOCIN T) 1 % external solution 1 Application, Topical, 2 times daily PRN   clobetasol cream (TEMOVATE) 0.05 % 1 Application, Topical, 2 times daily PRN   Continuous Glucose Sensor (FREESTYLE LIBRE 3 SENSOR) MISC 1 each, Does not apply, Every 14 days, Please apply for 14 days and then switch to new sensor   cyanocobalamin  (VITAMIN B12) 1,000 mcg, Oral, Daily   DULoxetine  (CYMBALTA ) 30 mg, Oral, Daily   Fluticasone -Umeclidin-Vilant (TRELEGY ELLIPTA ) 100-62.5-25 MCG/ACT AEPB 1 puff, Inhalation, Daily   Glucagon  (GVOKE HYPOPEN  2-PACK) 1 MG/0.2ML SOAJ Inject 1mg  pen into outer thigh or lower abdomen if blood sugar less than 54.   insulin  glargine, 1 Unit Dial, (TOUJEO  SOLOSTAR) 300 UNIT/ML Solostar Pen Increase by 2 units every 5 days until fasting sugars 90-120. Max daily dosage 20 units   levothyroxine  (SYNTHROID ) 175 mcg, Oral, Daily before breakfast   Melatonin 10 mg, Oral, Daily at bedtime   pantoprazole  (PROTONIX ) 40 mg, Oral,  Daily   predniSONE  (DELTASONE ) 20 mg, Oral, Daily with breakfast   sodium bicarbonate  650 mg, Oral, 3 times daily   Vitamin D  (Ergocalciferol ) (DRISDOL ) 50,000 Units, Oral, Every 7 days    Diet Orders (From admission, onward)     Start     Ordered   06/30/23 0937  Diet renal with fluid restriction Fluid restriction: 1200 mL Fluid; Room service appropriate? Yes; Fluid consistency: Thin  Diet effective now       Question Answer Comment  Fluid restriction: 1200 mL Fluid   Room service appropriate? Yes   Fluid consistency: Thin      06/30/23 0936            DVT prophylaxis: heparin  injection 5,000 Units Start: 06/29/23 2200   Lab Results  Component Value Date   PLT 93 (L) 07/03/2023      Code Status: Full Code  Family Communication: No family at bedside  Status is: Inpatient Remains inpatient appropriate because: Severity of illness   Level of care: Progressive  Consultants:  Nephrology  Objective: Vitals:   07/04/23 0404 07/04/23 0749 07/04/23 0824 07/04/23 0834  BP:  (!) 140/78  123/71  Pulse: 93 96  83  Resp: (!) 26     Temp:  98.5 F (36.9 C)  98.1 F (36.7 C)  TempSrc:  Oral  Oral  SpO2: 93% (!) 88% 90% 95%  Weight:      Height:        Intake/Output Summary (Last 24 hours) at 07/04/2023 1048 Last data filed at 07/03/2023 8147 Gross per 24 hour  Intake 203.94 ml  Output 3000 ml  Net -2796.06 ml   Wt Readings from Last 3 Encounters:  07/03/23 70.5 kg  06/27/23 79.4 kg  06/22/23 79.1 kg    Examination: Constitutional: NAD Eyes: lids and conjunctivae normal, no scleral icterus ENMT: mmm Neck: normal, supple Respiratory: Left lower lobe rhonchi, no wheezing Cardiovascular: Regular rate and rhythm, no murmurs / rubs / gallops. No LE edema. Abdomen: soft, no distention, no tenderness. Bowel sounds positive.  Skin: no rashes   Data Reviewed: I have independently reviewed following labs and imaging studies   CBC Recent Labs  Lab  06/29/23 1220 06/29/23 1225 06/30/23 1428 07/02/23 0712 07/03/23 1359  WBC 1.5*  --  9.3 14.8* 11.4*  HGB 8.8* 8.5*  8.5* 7.7* 7.3* 8.3*  HCT 26.6* 25.0*  25.0* 22.7* 22.4* 25.9*  PLT 131*  --  86* 74* 93*  MCV 92.0  --  90.1 93.7 94.2  MCH 30.4  --  30.6 30.5 30.2  MCHC 33.1  --  33.9 32.6 32.0  RDW 15.1  --  15.3 15.4 15.0  LYMPHSABS 0.7  --   --   --   --   MONOABS 0.1  --   --   --   --   EOSABS 0.1  --   --   --   --   BASOSABS 0.0  --   --   --   --     Recent Labs  Lab 06/29/23 1220 06/29/23 1225 06/29/23 1503 06/29/23 1749 06/30/23 1428 07/02/23 0712 07/03/23 1359  NA 136 136  136  --   --  135 137 137  K 3.9 3.9  3.9  --   --  4.3 4.0 3.3*  CL 103 101  --   --  99 100 100  CO2 22  --   --   --  23 26 22   GLUCOSE 96 90  --   --  119* 115* 142*  BUN 83* 84*  --   --  45* 63* 86*  CREATININE 4.87* 4.70*  --   --  3.18* 4.02* 4.70*  CALCIUM  7.1*  --   --   --  7.7* 8.6* 8.0*  AST 20  --   --   --   --   --   --   ALT 9  --   --   --   --   --   --   ALKPHOS 154*  --   --   --   --   --   --   BILITOT 0.5  --   --   --   --   --   --   ALBUMIN  2.2*  --   --   --  2.3* 2.3* 2.5*  PROCALCITON  --   --   --   --  101.91  --   --   LATICACIDVEN  --  2.8* 1.8  --   --   --   --   BNP  --   --   --  1,277.5*  --   --   --     ------------------------------------------------------------------------------------------------------------------ No results for input(s): CHOL, HDL, LDLCALC, TRIG, CHOLHDL, LDLDIRECT  in the last 72 hours.  Lab Results  Component Value Date   HGBA1C 5.7 (H) 05/07/2023   ------------------------------------------------------------------------------------------------------------------ No results for input(s): TSH, T4TOTAL, T3FREE, THYROIDAB in the last 72 hours.  Invalid input(s): FREET3  Cardiac Enzymes No results for input(s): CKMB, TROPONINI, MYOGLOBIN in the last 168 hours.  Invalid input(s):  CK ------------------------------------------------------------------------------------------------------------------    Component Value Date/Time   BNP 1,277.5 (H) 06/29/2023 1749    CBG: Recent Labs  Lab 07/03/23 1213 07/03/23 2008 07/03/23 2318 07/04/23 0320 07/04/23 0750  GLUCAP 173* 152* 158* 120* 92    Recent Results (from the past 240 hours)  Resp panel by RT-PCR (RSV, Flu A&B, Covid) Anterior Nasal Swab     Status: None   Collection Time: 06/29/23 12:21 PM   Specimen: Anterior Nasal Swab  Result Value Ref Range Status   SARS Coronavirus 2 by RT PCR NEGATIVE NEGATIVE Final   Influenza A by PCR NEGATIVE NEGATIVE Final   Influenza B by PCR NEGATIVE NEGATIVE Final    Comment: (NOTE) The Xpert Xpress SARS-CoV-2/FLU/RSV plus assay is intended as an aid in the diagnosis of influenza from Nasopharyngeal swab specimens and should not be used as a sole basis for treatment. Nasal washings and aspirates are unacceptable for Xpert Xpress SARS-CoV-2/FLU/RSV testing.  Fact Sheet for Patients: BloggerCourse.com  Fact Sheet for Healthcare Providers: SeriousBroker.it  This test is not yet approved or cleared by the [place]  FDA and has been authorized for detection and/or diagnosis of SARS-CoV-2 by FDA under an Emergency Use Authorization (EUA). This EUA will remain in effect (meaning this test can be used) for the duration of the COVID-19 declaration under Section 564(b)(1) of the Act, 21 U.S.C. section 360bbb-3(b)(1), unless the authorization is terminated or revoked.     Resp Syncytial Virus by PCR NEGATIVE NEGATIVE Final    Comment: (NOTE) Fact Sheet for Patients: BloggerCourse.com  Fact Sheet for Healthcare Providers: SeriousBroker.it  This test is not yet approved or cleared by the [place]  FDA and has been authorized for detection and/or diagnosis of  SARS-CoV-2 by FDA under an Emergency Use Authorization (EUA). This EUA will remain in effect (meaning this test can be used) for the duration of the COVID-19 declaration under Section 564(b)(1) of the Act, 21 U.S.C. section 360bbb-3(b)(1), unless the authorization is terminated or revoked.  Performed at [facility], [address], [place], [place] [address]   Blood culture (routine x 2)     Status: None   Collection Time: 06/29/23  5:47 PM   Specimen: BLOOD RIGHT WRIST  Result Value Ref Range Status   Specimen Description BLOOD RIGHT WRIST  Final   Special Requests   Final    BOTTLES DRAWN AEROBIC AND ANAEROBIC Blood Culture adequate volume   Culture   Final    NO GROWTH 5 DAYS Performed at [facility], [address], [place], [place] [address]    Report Status 07/04/2023 FINAL  Final  Blood culture (routine x 2)     Status: None   Collection Time: 06/29/23  6:32 PM   Specimen: BLOOD RIGHT WRIST  Result Value Ref Range Status   Specimen Description BLOOD RIGHT WRIST  Final   Special Requests   Final    BOTTLES DRAWN AEROBIC AND ANAEROBIC Blood Culture results may not be optimal due to an inadequate volume of blood received in culture bottles   Culture   Final    NO GROWTH 5 DAYS Performed at [facility], [address], [place], [place] [address]    Report Status 07/04/2023 FINAL  Final     Radiology Studies: DG Chest 2 View Result Date: 07/03/2023 CLINICAL DATA:  Community acquired pneumonia. EXAM: CHEST - 2 VIEW COMPARISON:  Chest x-ray 06/29/2023.  CT of the chest 06/28/2023 per FINDINGS: Right-sided central venous catheter tip ends in the SVC. The heart is mildly enlarged. There is a trace left in the small right pleural effusion. There are patchy opacities in the left lung base. There is no pneumothorax or acute fracture. IMPRESSION: 1. Trace left and small right pleural effusions. 2. Patchy opacities in the left lung base may represent atelectasis or  pneumonia. Electronically Signed   By: Greig Pique M.D.   On: 07/03/2023 21:40      Nilda Fendt, MD, PhD [facility]  Between 7 am - 7 pm I am available, please contact me via Amion (for emergencies) or Securechat (non urgent messages)  Between 7 pm - 7 am I am not available, please contact night coverage MD/APP via Amion

## 2023-07-04 NOTE — TOC CM/SW Note (Signed)
 Spoke with pt and answered questions regarding establishing with the [place]. Pt voiced he is a veteran but has never established with the [facility] for care/benefits.  Pt to follow up with the [facility] and start process to establish for VA benefits.

## 2023-07-05 ENCOUNTER — Other Ambulatory Visit (HOSPITAL_COMMUNITY)

## 2023-07-05 DIAGNOSIS — G894 Chronic pain syndrome: Secondary | ICD-10-CM | POA: Diagnosis not present

## 2023-07-05 DIAGNOSIS — Z95828 Presence of other vascular implants and grafts: Secondary | ICD-10-CM

## 2023-07-05 DIAGNOSIS — I10 Essential (primary) hypertension: Secondary | ICD-10-CM

## 2023-07-05 DIAGNOSIS — Z9889 Other specified postprocedural states: Secondary | ICD-10-CM

## 2023-07-05 DIAGNOSIS — J9601 Acute respiratory failure with hypoxia: Secondary | ICD-10-CM | POA: Diagnosis not present

## 2023-07-05 DIAGNOSIS — Z992 Dependence on renal dialysis: Secondary | ICD-10-CM

## 2023-07-05 DIAGNOSIS — N186 End stage renal disease: Secondary | ICD-10-CM

## 2023-07-05 DIAGNOSIS — D72829 Elevated white blood cell count, unspecified: Secondary | ICD-10-CM

## 2023-07-05 DIAGNOSIS — D696 Thrombocytopenia, unspecified: Secondary | ICD-10-CM

## 2023-07-05 DIAGNOSIS — E039 Hypothyroidism, unspecified: Secondary | ICD-10-CM | POA: Diagnosis not present

## 2023-07-05 LAB — COMPREHENSIVE METABOLIC PANEL WITH GFR
ALT: 10 U/L (ref 0–44)
AST: 10 U/L — ABNORMAL LOW (ref 15–41)
Albumin: 2.4 g/dL — ABNORMAL LOW (ref 3.5–5.0)
Alkaline Phosphatase: 207 U/L — ABNORMAL HIGH (ref 38–126)
Anion gap: 16 — ABNORMAL HIGH (ref 5–15)
BUN: 68 mg/dL — ABNORMAL HIGH (ref 8–23)
CO2: 21 mmol/L — ABNORMAL LOW (ref 22–32)
Calcium: 8.5 mg/dL — ABNORMAL LOW (ref 8.9–10.3)
Chloride: 98 mmol/L (ref 98–111)
Creatinine, Ser: 4.78 mg/dL — ABNORMAL HIGH (ref 0.61–1.24)
GFR, Estimated: 12 mL/min — ABNORMAL LOW (ref >60)
Glucose, Bld: 90 mg/dL (ref 70–99)
Potassium: 4.3 mmol/L (ref 3.5–5.1)
Sodium: 135 mmol/L (ref 135–145)
Total Bilirubin: 0.6 mg/dL (ref 0.0–1.2)
Total Protein: 5.9 g/dL — ABNORMAL LOW (ref 6.5–8.1)

## 2023-07-05 LAB — GLUCOSE, CAPILLARY
Glucose-Capillary: 109 mg/dL — ABNORMAL HIGH (ref 70–99)
Glucose-Capillary: 117 mg/dL — ABNORMAL HIGH (ref 70–99)
Glucose-Capillary: 127 mg/dL — ABNORMAL HIGH (ref 70–99)
Glucose-Capillary: 130 mg/dL — ABNORMAL HIGH (ref 70–99)
Glucose-Capillary: 152 mg/dL — ABNORMAL HIGH (ref 70–99)
Glucose-Capillary: 88 mg/dL (ref 70–99)

## 2023-07-05 LAB — CBC
HCT: 26.9 % — ABNORMAL LOW (ref 39.0–52.0)
Hemoglobin: 8.9 g/dL — ABNORMAL LOW (ref 13.0–17.0)
MCH: 30.5 pg (ref 26.0–34.0)
MCHC: 33.1 g/dL (ref 30.0–36.0)
MCV: 92.1 fL (ref 80.0–100.0)
Platelets: 141 10*3/uL — ABNORMAL LOW (ref 150–400)
RBC: 2.92 MIL/uL — ABNORMAL LOW (ref 4.22–5.81)
RDW: 14.7 % (ref 11.5–15.5)
WBC: 15.4 10*3/uL — ABNORMAL HIGH (ref 4.0–10.5)
nRBC: 0 % (ref 0.0–0.2)

## 2023-07-05 LAB — MAGNESIUM: Magnesium: 1.7 mg/dL (ref 1.7–2.4)

## 2023-07-05 LAB — PHOSPHORUS: Phosphorus: 4.5 mg/dL (ref 2.5–4.6)

## 2023-07-05 MED ORDER — HEPARIN SODIUM (PORCINE) 1000 UNIT/ML IJ SOLN
INTRAMUSCULAR | Status: AC
Start: 2023-07-05 — End: 2023-07-05
  Filled 2023-07-05: qty 4

## 2023-07-05 MED ORDER — LIDOCAINE HCL (PF) 1 % IJ SOLN: 5.0000 mL | INTRAMUSCULAR | Status: DC | PRN

## 2023-07-05 MED ORDER — HEPARIN SODIUM (PORCINE) 1000 UNIT/ML DIALYSIS
1000.0000 [IU] | INTRAMUSCULAR | Status: DC | PRN
  Administered 2023-07-05: 3200 [IU]

## 2023-07-05 MED ORDER — ANTICOAGULANT SODIUM CITRATE 4% (200MG/5ML) IV SOLN: 5.0000 mL | Status: DC | PRN

## 2023-07-05 MED ORDER — PENTAFLUOROPROP-TETRAFLUOROETH EX AERO: 1.0000 | INHALATION_SPRAY | CUTANEOUS | Status: DC | PRN

## 2023-07-05 MED ORDER — ALTEPLASE 2 MG IJ SOLR: 2.0000 mg | Freq: Once | INTRAMUSCULAR | Status: DC | PRN

## 2023-07-05 MED ORDER — LIDOCAINE-PRILOCAINE 2.5-2.5 % EX CREA: 1.0000 | TOPICAL_CREAM | CUTANEOUS | Status: DC | PRN

## 2023-07-05 MED ORDER — ACETAMINOPHEN 325 MG PO TABS
ORAL_TABLET | ORAL | Status: AC
Start: 2023-07-05 — End: 2023-07-05
  Filled 2023-07-05: qty 2

## 2023-07-05 NOTE — Progress Notes (Signed)
 PROGRESS NOTE  Walter Wilkerson FMW:[id number] DOB: October 06, 1950   PCP: Antoniette Vermell CROME, PA-C  Patient is from: Home.  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  Initially placed on BiPAP. Imaging showed evidence of fluid overload and possible left lower lobe pneumonia versus consolidative atelectasis. Nephrology was consulted and started emergent HD.  Also started on ceftriaxone  and Zithromax  for pneumonia.   Subjective: Seen and examined earlier this morning.  No major events overnight or this morning.  No complaints other than some back pain from lying on hospital bed.  Denies shortness of breath.  Some intermittent cough but improved.  Denies chest pain.  He says he decided to proceed with permanent access.  He said he called vascular surgery office and left a voicemail.  Objective: Vitals:   07/05/23 1030 07/05/23 1045 07/05/23 1100 07/05/23 1154  BP: 102/66 113/66 114/72 96/61  Pulse: 89 88 87 91  Resp: 19 (!) 27 (!) 23 16  Temp:    98.1 F (36.7 C)  TempSrc:      SpO2: 94% 95% 96% 97%  Weight:      Height:        Examination:  GENERAL: No apparent distress.  Nontoxic. HEENT: MMM.  Vision and hearing grossly intact.  NECK: Supple.  No apparent JVD.  RESP:  No IWOB.  Fair aeration bilaterally. CVS:  RRR. Heart sounds normal.  ABD/GI/GU: BS+. Abd soft, NTND.  MSK/EXT:  Moves extremities. No apparent deformity. No edema.  SKIN: no apparent skin lesion or wound NEURO: AA.  Oriented appropriately.  No apparent focal neuro deficit. PSYCH: Calm.  Normal affect.   Consultants:  Nephrology Vascular surgery  Procedures: None  Microbiology summarized: COVID-19, influenza and RSV PCR nonreactive Blood cultures NGTD  Assessment and plan: Acute respiratory failure with hypoxia due to pulmonary edema and pneumonia.  Patient with ESRD and volume overloaded on presentation.  Had significant SOB at his first outpatient HD. -Received serial HD.  Respiratory symptoms improved. -HD per nephrology -Antibiotics for pneumonia -Wean oxygen, incentive spirometry, OOB,   ESRD, progressive glomerulonephritis -thought to be triggered by chemotherapy in the past.  He is on steroids as an outpatient - Continue outpatient steroid -Patient has decided to proceed with permanent access for HD.  Vascular notified.   Possible LLL CAP -CT scan on admission with dense left lower lobe consolidation with air bronchograms, severe pneumonia versus consolidative atelectasis.  He did have a cough but no fevers.  Repeat chest x-ray after 3 HD sessions has persistent infiltrates, suggesting true community-acquired pneumonia.  He is immunosuppressed - Continue ceftriaxone  and Zithromax .   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  Had transient symptoms this morning, now resolved.  Normal CT head.   Hypotension -requiring midodrine  prior to dialysis.  Normotensive off midodrine .   Pancytopenia-overall stable, continue to monitor   Hypothyroidism-continue home Synthroid    History of lung cancer-status post resection  Leukocytosis: Likely due to steroid - Continue monitoring  Thoracic aortic aneurysm - Outpatient follow-up.  Body mass index is  22.69 kg/m.           DVT prophylaxis:  heparin  injection 5,000 Units Start: 06/29/23 2200  Code Status: Stable Family Communication: None at bedside. Level of care: Progressive Status is: Inpatient Remains inpatient  appropriate because: Acute respiratory failure with hypoxia, fluid overload and pneumonia   Final disposition: Home  55 minutes with more than 50% spent in reviewing records, counseling patient/family and coordinating care.   Sch Meds:  Scheduled Meds:  (feeding supplement) PROSource Plus  30 mL Oral BID BM   atorvastatin   40 mg Oral Daily   budesonide -glycopyrrolate -formoterol   2 puff Inhalation BID   Chlorhexidine  Gluconate Cloth  6 each Topical Q0600   Chlorhexidine  Gluconate Cloth  6 each Topical Q0600   darbepoetin (ARANESP ) injection - DIALYSIS  40 mcg Subcutaneous Q Sat-1800   furosemide   40 mg Oral Daily   heparin   5,000 Units Subcutaneous Q8H   levothyroxine   175 mcg Oral QAC breakfast   melatonin  10 mg Oral QHS   pantoprazole   40 mg Oral Daily   predniSONE   20 mg Oral Q breakfast   sodium chloride  flush  3 mL Intravenous Q12H   Continuous Infusions:  anticoagulant sodium citrate      PRN Meds:.acetaminophen  **OR** acetaminophen , alteplase , anticoagulant sodium citrate , heparin , ipratropium-albuterol , lidocaine  (PF), lidocaine -prilocaine , pentafluoroprop-tetrafluoroeth, polyethylene glycol  Antimicrobials: Anti-infectives (From admission, onward)    Start     Dose/Rate Route Frequency Ordered Stop   07/02/23 1000  azithromycin  (ZITHROMAX ) tablet 500 mg        500 mg Oral Daily 07/01/23 1211 07/04/23 0827   06/30/23 1000  cefTRIAXone  (ROCEPHIN ) 2 g in sodium chloride  0.9 % 100 mL IVPB        2 g 200 mL/hr over 30 Minutes Intravenous Every 24 hours 06/29/23 1807 07/03/23 1142   06/30/23 1000  azithromycin  (ZITHROMAX ) 500 mg in sodium chloride  0.9 % 250 mL IVPB  Status:  Discontinued        500 mg 250 mL/hr over 60 Minutes Intravenous Every 24 hours 06/29/23 1807 07/01/23 1211   06/29/23 1730  cefTRIAXone  (ROCEPHIN ) 2 g in sodium chloride  0.9 % 100 mL IVPB        2 g 200 mL/hr over 30 Minutes Intravenous  Once 06/29/23 1719 06/29/23 1840   06/29/23 1730  azithromycin   (ZITHROMAX ) 500 mg in sodium chloride  0.9 % 250 mL IVPB        500 mg 250 mL/hr over 60 Minutes Intravenous  Once 06/29/23 1719 06/29/23 2012        I have personally reviewed the following labs and images: CBC: Recent Labs  Lab 06/29/23 1220 06/29/23 1225 06/30/23 1428 07/02/23 0712 07/03/23 1359 07/05/23 0614  WBC 1.5*  --  9.3 14.8* 11.4* 15.4*  NEUTROABS 0.5*  --   --   --   --   --   HGB 8.8* 8.5*  8.5* 7.7* 7.3* 8.3* 8.9*  HCT 26.6* 25.0*  25.0* 22.7* 22.4* 25.9* 26.9*  MCV 92.0  --  90.1 93.7 94.2 92.1  PLT 131*  --  86* 74* 93* 141*   BMP &GFR Recent Labs  Lab 06/29/23 1220 06/29/23 1225 06/30/23 1428 07/02/23 0712 07/03/23 1359 07/05/23 0614  NA 136 136  136 135 137 137 135  K 3.9 3.9  3.9 4.3 4.0 3.3* 4.3  CL 103 101 99 100 100 98  CO2 22  --  23 26 22  21*  GLUCOSE 96 90 119* 115* 142* 90  BUN  83* 84* 45* 63* 86* 68*  CREATININE 4.87* 4.70* 3.18* 4.02* 4.70* 4.78*  CALCIUM  7.1*  --  7.7* 8.6* 8.0* 8.5*  MG  --   --   --   --   --  1.7  PHOS  --   --  4.4 4.2 3.1 4.5   Estimated Creatinine Clearance: 13.2 mL/min (A) (by C-G formula based on SCr of 4.78 mg/dL (H)). Liver & Pancreas: Recent Labs  Lab 06/29/23 1220 06/30/23 1428 07/02/23 0712 07/03/23 1359 07/05/23 0614  AST 20  --   --   --  10*  ALT 9  --   --   --  10  ALKPHOS 154*  --   --   --  207*  BILITOT 0.5  --   --   --  0.6  PROT 4.8*  --   --   --  5.9*  ALBUMIN  2.2* 2.3* 2.3* 2.5* 2.4*   Recent Labs  Lab 06/29/23 1232  LIPASE 28   No results for input(s): AMMONIA in the last 168 hours. Diabetic: No results for input(s): HGBA1C in the last 72 hours. Recent Labs  Lab 07/04/23 1136 07/04/23 1608 07/04/23 1910 07/05/23 0008 07/05/23 0340  GLUCAP 132* 142* 150* 127* 109*   Cardiac Enzymes: No results for input(s): CKTOTAL, CKMB, CKMBINDEX, TROPONINI in the last 168 hours. No results for input(s): PROBNP in the last 8760 hours. Coagulation Profile: No  results for input(s): INR, PROTIME in the last 168 hours. Thyroid  Function Tests: No results for input(s): TSH, T4TOTAL, FREET4, T3FREE, THYROIDAB in the last 72 hours. Lipid Profile: No results for input(s): CHOL, HDL, LDLCALC, TRIG, CHOLHDL, LDLDIRECT in the last 72 hours. Anemia Panel: No results for input(s): VITAMINB12, FOLATE, FERRITIN, TIBC, IRON , RETICCTPCT in the last 72 hours. Urine analysis:    Component Value Date/Time   COLORURINE YELLOW 05/03/2023 0900   APPEARANCEUR CLEAR 05/03/2023 0900   LABSPEC 1.009 05/03/2023 0900   PHURINE 6.0 05/03/2023 0900   GLUCOSEU NEGATIVE 05/03/2023 0900   HGBUR LARGE (A) 05/03/2023 0900   BILIRUBINUR NEGATIVE 05/03/2023 0900   KETONESUR NEGATIVE 05/03/2023 0900   PROTEINUR 30 (A) 05/03/2023 0900   NITRITE NEGATIVE 05/03/2023 0900   LEUKOCYTESUR NEGATIVE 05/03/2023 0900   Sepsis Labs: Invalid input(s): PROCALCITONIN, LACTICIDVEN  Microbiology: Recent Results (from the past 240 hours)  Resp panel by RT-PCR (RSV, Flu A&B, Covid) Anterior Nasal Swab     Status: None   Collection Time: 06/29/23 12:21 PM   Specimen: Anterior Nasal Swab  Result Value Ref Range Status   SARS Coronavirus 2 by RT PCR NEGATIVE NEGATIVE Final   Influenza A by PCR NEGATIVE NEGATIVE Final   Influenza B by PCR NEGATIVE NEGATIVE Final    Comment: (NOTE) The Xpert Xpress SARS-CoV-2/FLU/RSV plus assay is intended as an aid in the diagnosis of influenza from Nasopharyngeal swab specimens and should not be used as a sole basis for treatment. Nasal washings and aspirates are unacceptable for Xpert Xpress SARS-CoV-2/FLU/RSV testing.  Fact Sheet for Patients: BloggerCourse.com  Fact Sheet for Healthcare Providers: SeriousBroker.it  This test is not yet approved or cleared by the [place]  FDA and has been authorized for detection and/or diagnosis of SARS-CoV-2 by FDA  under an Emergency Use Authorization (EUA). This EUA will remain in effect (meaning this test can be used) for the duration of the COVID-19 declaration under Section 564(b)(1) of the Act, 21 U.S.C. section 360bbb-3(b)(1), unless the authorization is terminated or revoked.  Resp Syncytial Virus by PCR NEGATIVE NEGATIVE Final    Comment: (NOTE) Fact Sheet for Patients: BloggerCourse.com  Fact Sheet for Healthcare Providers: SeriousBroker.it  This test is not yet approved or cleared by the [place]  FDA and has been authorized for detection and/or diagnosis of SARS-CoV-2 by FDA under an Emergency Use Authorization (EUA). This EUA will remain in effect (meaning this test can be used) for the duration of the COVID-19 declaration under Section 564(b)(1) of the Act, 21 U.S.C. section 360bbb-3(b)(1), unless the authorization is terminated or revoked.  Performed at [facility], [address] [place], [place] [address]   Blood culture (routine x 2)     Status: None   Collection Time: 06/29/23  5:47 PM   Specimen: BLOOD RIGHT WRIST  Result Value Ref Range Status   Specimen Description BLOOD RIGHT WRIST  Final   Special Requests   Final    BOTTLES DRAWN AEROBIC AND ANAEROBIC Blood Culture adequate volume   Culture   Final    NO GROWTH 5 DAYS Performed at [facility], [address] [place], [place] [address]    Report Status 07/04/2023 FINAL  Final  Blood culture (routine x 2)     Status: None   Collection Time: 06/29/23  6:32 PM   Specimen: BLOOD RIGHT WRIST  Result Value Ref Range Status   Specimen Description BLOOD RIGHT WRIST  Final   Special Requests   Final    BOTTLES DRAWN AEROBIC AND ANAEROBIC Blood Culture results may not be optimal due to an inadequate volume of blood received in culture bottles   Culture   Final    NO GROWTH 5 DAYS Performed at [facility], [address], [place],  [place] [address]    Report Status 07/04/2023 FINAL  Final    Radiology Studies: No results found.    Smaran Gaus T. Candy Leverett [facility]  If 7PM-7AM, please contact night-coverage [email or web address] 07/05/2023, 11:57 AM

## 2023-07-05 NOTE — Progress Notes (Signed)
 Pt receives out-pt HD at [facility] on MWF 11:30 am chair time. Pt has had one treatment at clinic per staff and pt can resume at d/c. Will assist as needed.   Randine Mungo Renal Navigator [phone]

## 2023-07-05 NOTE — Plan of Care (Signed)
  Problem: Education: Goal: Ability to describe self-care measures that may prevent or decrease complications (Diabetes Survival Skills Education) will improve Outcome: Progressing Goal: Individualized Educational Video(s) Outcome: Progressing   Problem: Coping: Goal: Ability to adjust to condition or change in health will improve Outcome: Progressing   Problem: Fluid Volume: Goal: Ability to maintain a balanced intake and output will improve Outcome: Progressing   Problem: Health Behavior/Discharge Planning: Goal: Ability to identify and utilize available resources and services will improve Outcome: Progressing Goal: Ability to manage health-related needs will improve Outcome: Progressing   Problem: Metabolic: Goal: Ability to maintain appropriate glucose levels will improve Outcome: Progressing   Problem: Nutritional: Goal: Maintenance of adequate nutrition will improve Outcome: Progressing Goal: Progress toward achieving an optimal weight will improve Outcome: Progressing   Problem: Skin Integrity: Goal: Risk for impaired skin integrity will decrease Outcome: Progressing   Problem: Tissue Perfusion: Goal: Adequacy of tissue perfusion will improve Outcome: Progressing   Problem: Activity: Goal: Ability to tolerate increased activity will improve Outcome: Progressing   Problem: Clinical Measurements: Goal: Ability to maintain a body temperature in the normal range will improve Outcome: Progressing   Problem: Respiratory: Goal: Ability to maintain adequate ventilation will improve Outcome: Progressing Goal: Ability to maintain a clear airway will improve Outcome: Progressing   Problem: Education: Goal: Knowledge of General Education information will improve Description: Including pain rating scale, medication(s)/side effects and non-pharmacologic comfort measures Outcome: Progressing   Problem: Health Behavior/Discharge Planning: Goal: Ability to manage  health-related needs will improve Outcome: Progressing   Problem: Clinical Measurements: Goal: Ability to maintain clinical measurements within normal limits will improve Outcome: Progressing Goal: Will remain free from infection Outcome: Progressing Goal: Diagnostic test results will improve Outcome: Progressing Goal: Respiratory complications will improve Outcome: Progressing Goal: Cardiovascular complication will be avoided Outcome: Progressing   Problem: Activity: Goal: Risk for activity intolerance will decrease Outcome: Progressing   Problem: Nutrition: Goal: Adequate nutrition will be maintained Outcome: Progressing   Problem: Coping: Goal: Level of anxiety will decrease Outcome: Progressing   Problem: Elimination: Goal: Will not experience complications related to bowel motility Outcome: Progressing Goal: Will not experience complications related to urinary retention Outcome: Progressing   Problem: Pain Managment: Goal: General experience of comfort will improve and/or be controlled Outcome: Progressing   Problem: Safety: Goal: Ability to remain free from injury will improve Outcome: Progressing   Problem: Skin Integrity: Goal: Risk for impaired skin integrity will decrease Outcome: Progressing   Problem: Education: Goal: Knowledge of disease and its progression will improve Outcome: Progressing Goal: Individualized Educational Video(s) Outcome: Progressing   Problem: Fluid Volume: Goal: Compliance with measures to maintain balanced fluid volume will improve Outcome: Progressing   Problem: Health Behavior/Discharge Planning: Goal: Ability to manage health-related needs will improve Outcome: Progressing   Problem: Nutritional: Goal: Ability to make healthy dietary choices will improve Outcome: Progressing   Problem: Clinical Measurements: Goal: Complications related to the disease process, condition or treatment will be avoided or  minimized Outcome: Progressing

## 2023-07-05 NOTE — Progress Notes (Addendum)
 [facility] Progress Note   Subjective:    Seen in HD unit No new c/o's Walked in halls w/ O2   Objective Vitals:   07/05/23 1000 07/05/23 1030 07/05/23 1045 07/05/23 1100  BP: 128/67 102/66 113/66 114/72  Pulse: 88 89 88 87  Resp: 19 19 (!) 27 (!) 23  Temp:      TempSrc:      SpO2: (!) 87% 94% 95% 96%  Weight:      Height:       Physical Exam General: NAD, [place] O2 3 L  Heart: RRR; no murmur Lungs: clear now Abdomen: soft ntnd  Extremities: no LE edema Dialysis Access:  TDC in R chest  OP HD: MWF SW has had only 1 outpatient HD session on 6/19 (got 2 hrs, 2.5 ordered) 3 -> 4h  B300 -> 400     78kg   2K  RIJ TDC  Heparin  none Hectorol 1 micrograms IV tts    Assessment/Plan: AHRF: Pulm edema + COPD + pneumonia, on abx and O2, improving with volume off with HD x 3. Down 11 kg since admission. On O2 4 L [place]. F/u CXR shows improved LLL infiltrate, no edema.  LLL pneumonia: On Ceftriaxone  + azithromycin . He has been immunosuppressed. F/u CXR 6/23 looked improved. No edema in any of the CXR's.  ESRD: recent start on dialysis, MWF schedule. Had HD here last Thursday and Friday, and this Monday. HD today.  HD access: has TDC placed last week. Seen by [facility], they are discussing options today.  HTN/volume: Off antihypertensives, bp's are stable.  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 WBC higher, Plts dropping. Secondary HPTH: CorrCa, Phos ok - no binder or VDRA for now. Nutrition: Continue renal diet, continue supplements. 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 Rituximan #2 05/30/23. pANCA titer 1:320 5/6 with UPC 300mg /g.  Decision not to offer more Rituximab  given complications with steroid cream jitteriness, fungal infection, puffy face and hyperglycemia requiring insulin  as well as advanced  renal failure on 6/4. Prednisone  was lowered to 20 mg and Bactrim  discontinued.    Myer Fret  MD  [facility] 07/05/2023, 11:48 AM  Recent Labs  Lab 07/03/23 1359 07/05/23 0614  HGB 8.3* 8.9*  ALBUMIN  2.5* 2.4*  CALCIUM  8.0* 8.5*  PHOS 3.1 4.5  CREATININE 4.70* 4.78*  K 3.3* 4.3    Inpatient medications:  (feeding supplement) PROSource Plus  30 mL Oral BID BM   atorvastatin   40 mg Oral Daily   budesonide -glycopyrrolate -formoterol   2 puff Inhalation BID   Chlorhexidine  Gluconate Cloth  6 each Topical Q0600   Chlorhexidine  Gluconate Cloth  6 each Topical Q0600   darbepoetin (ARANESP ) injection - DIALYSIS  40 mcg Subcutaneous Q Sat-1800   furosemide   40 mg Oral Daily   heparin   5,000 Units Subcutaneous Q8H   levothyroxine   175 mcg Oral QAC breakfast   melatonin  10 mg Oral QHS   pantoprazole   40 mg Oral Daily   predniSONE   20 mg Oral Q breakfast   sodium chloride  flush  3 mL Intravenous Q12H    anticoagulant sodium citrate      acetaminophen  **OR** acetaminophen , alteplase , anticoagulant sodium citrate , heparin , ipratropium-albuterol , lidocaine  (PF), lidocaine -prilocaine , pentafluoroprop-tetrafluoroeth, polyethylene glycol

## 2023-07-05 NOTE — Progress Notes (Signed)
 Received patient in bed to unit.  Alert and oriented.  Informed consent signed and in chart.   TX duration:3.5 hours Patient cartridge clotted off with 6 minutes left in session.  Patient tolerated well.  Transported back to the room  Alert, without acute distress.  Hand-off given to patient's nurse.   Access used: R internal jugular HD Cath Access issues: Patient clotted off with 6 minutes left  Total UF removed: 2.2L Medication(s) given: Tylenol    07/05/23 1154  Vitals  Temp 98.1 F (36.7 C)  BP 96/61  Pulse Rate 91  Resp 16  Oxygen Therapy  SpO2 97 %  O2 Device Nasal Cannula  Patient Activity (if Appropriate) In bed  Pulse Oximetry Type Continuous  During Treatment Monitoring  Blood Flow Rate (mL/min) 0 mL/min  Arterial Pressure (mmHg) 68.28 mmHg  Venous Pressure (mmHg) 523.81 mmHg  TMP (mmHg) 26.06 mmHg  Ultrafiltration Rate (mL/min) 0 mL/min  Dialysate Flow Rate (mL/min) 112 ml/min  Dialysate Potassium Concentration 3  Dialysate Calcium  Concentration 2.5  Duration of HD Treatment -hour(s) 3.4 hour(s)  Cumulative Fluid Removed (mL) per Treatment  8030.87  HD Safety Checks Performed Yes  Intra-Hemodialysis Comments Tx completed  Dialysis Fluid Bolus Normal Saline  Bolus Amount (mL) 300 mL     Camellia Brasil LPN [facility] D[facility]

## 2023-07-05 NOTE — Progress Notes (Addendum)
 Progress Note    07/05/2023 6:44 AM Hospital Day 6  Subjective:  still conflicted about what path to take.  He and his wife spoke yesterday.  Still trying to decide.    afebrile  Vitals:   07/04/23 2359 07/05/23 0336  BP: 135/79 133/75  Pulse: 85 79  Resp: 18 18  Temp: 98.8 F (37.1 C) 97.8 F (36.6 C)  SpO2: 91% 98%    Physical Exam: General:  no distress Lungs:  non labored  CBC    Component Value Date/Time   WBC 11.4 (H) 07/03/2023 1359   RBC 2.75 (L) 07/03/2023 1359   HGB 8.3 (L) 07/03/2023 1359   HGB 11.0 (L) 05/19/2023 1026   HCT 25.9 (L) 07/03/2023 1359   HCT 34.1 (L) 05/19/2023 1026   PLT 93 (L) 07/03/2023 1359   PLT 112 (L) 05/19/2023 1026   MCV 94.2 07/03/2023 1359   MCV 92 05/19/2023 1026   MCH 30.2 07/03/2023 1359   MCHC 32.0 07/03/2023 1359   RDW 15.0 07/03/2023 1359   RDW 16.8 (H) 05/19/2023 1026   LYMPHSABS 0.7 06/29/2023 1220   LYMPHSABS 0.1 (L) 05/19/2023 1026   MONOABS 0.1 06/29/2023 1220   EOSABS 0.1 06/29/2023 1220   EOSABS 0.0 05/19/2023 1026   BASOSABS 0.0 06/29/2023 1220   BASOSABS 0.0 05/19/2023 1026    BMET    Component Value Date/Time   NA 137 07/03/2023 1359   NA 131 (L) 05/19/2023 1026   K 3.3 (L) 07/03/2023 1359   CL 100 07/03/2023 1359   CO2 22 07/03/2023 1359   GLUCOSE 142 (H) 07/03/2023 1359   BUN 86 (H) 07/03/2023 1359   BUN 119 (HH) 05/19/2023 1026   CREATININE 4.70 (H) 07/03/2023 1359   CREATININE 5.10 (H) 04/27/2023 1017   CREATININE 1.44 (H) 01/11/2022 0945   CALCIUM  8.0 (L) 07/03/2023 1359   GFRNONAA 12 (L) 07/03/2023 1359   GFRNONAA 11 (L) 04/27/2023 1017   GFRNONAA 72 03/04/2020 0000   GFRAA 83 03/04/2020 0000    INR    Component Value Date/Time   INR 1.1 05/02/2023 0941     Intake/Output Summary (Last 24 hours) at 07/05/2023 0644 Last data filed at 07/05/2023 0200 Gross per 24 hour  Intake --  Output 200 ml  Net -200 ml     Assessment/Plan:  73 y.o. male with new ESRD in need of dialysis  access & had TDC placed on 06/27/2023 by Dr. Lanis Salvage Day 6  -pt states he and his wife spoke yesterday.  They have been gathering information and want to make the right decision for them.  He is currently conflicted.  We spoke about HD access.  I discussed with him that we create it and hope that it works well, but sometimes needs additional procedures to mature, may not mature and need new access.  Also talked with him that it may last for years and may only last short term but at some point, will need new access and could be soon or even years down the road.  Discussed having dialysis created and even if he goes with PD, he would still have option of HD.  Possible for OR tomorrow but most likely will need outpatient appt with MD for further discussions and outpatient surgery.  He would like his wife to be present for discussions.  -leukocytosis improving -thrombocytopenia also improving and 93k today up from 74k yesterday.    Lucie Apt, PA-C [facility] [phone] 07/05/2023 6:44 AM  VASCULAR STAFF ADDENDUM: I have independently interviewed and examined the patient. I agree with the above.  I had a long discussion with Bill regarding peritoneal dialysis versus HD access.  Unfortunately, superficial veins are small bilaterally. He would likely require graft-forearm loop versus upper extremity.  Regardless, graft, with decreased long-term patency.  If he is interested in peritoneal dialysis I think that now would be the time.  After discussing the risks and benefits of both, Bill wanted to proceed with left arm AV graft.  This will be scheduled for tomorrow, however will be canceled with any leukocytosis, and scheduled in the outpatient setting.  Fonda FORBES Rim MD [facility] Phone Number: [phone] 07/05/2023 6:03 PM

## 2023-07-06 ENCOUNTER — Encounter: Payer: Self-pay | Admitting: Hematology & Oncology

## 2023-07-06 ENCOUNTER — Encounter (HOSPITAL_COMMUNITY): Admission: EM | Payer: Self-pay | Source: Home / Self Care | Attending: Internal Medicine

## 2023-07-06 ENCOUNTER — Other Ambulatory Visit (HOSPITAL_COMMUNITY): Payer: Self-pay

## 2023-07-06 DIAGNOSIS — E039 Hypothyroidism, unspecified: Secondary | ICD-10-CM | POA: Diagnosis not present

## 2023-07-06 DIAGNOSIS — I10 Essential (primary) hypertension: Secondary | ICD-10-CM | POA: Diagnosis not present

## 2023-07-06 DIAGNOSIS — N186 End stage renal disease: Secondary | ICD-10-CM

## 2023-07-06 DIAGNOSIS — Z992 Dependence on renal dialysis: Secondary | ICD-10-CM

## 2023-07-06 DIAGNOSIS — Z95828 Presence of other vascular implants and grafts: Secondary | ICD-10-CM

## 2023-07-06 DIAGNOSIS — J9601 Acute respiratory failure with hypoxia: Secondary | ICD-10-CM | POA: Diagnosis not present

## 2023-07-06 DIAGNOSIS — G894 Chronic pain syndrome: Secondary | ICD-10-CM | POA: Diagnosis not present

## 2023-07-06 LAB — CBC
HCT: 27.4 % — ABNORMAL LOW (ref 39.0–52.0)
Hemoglobin: 9 g/dL — ABNORMAL LOW (ref 13.0–17.0)
MCH: 30.1 pg (ref 26.0–34.0)
MCHC: 32.8 g/dL (ref 30.0–36.0)
MCV: 91.6 fL (ref 80.0–100.0)
Platelets: 167 10*3/uL (ref 150–400)
RBC: 2.99 MIL/uL — ABNORMAL LOW (ref 4.22–5.81)
RDW: 14.8 % (ref 11.5–15.5)
WBC: 19.2 10*3/uL — ABNORMAL HIGH (ref 4.0–10.5)
nRBC: 0 % (ref 0.0–0.2)

## 2023-07-06 LAB — GLUCOSE, CAPILLARY
Glucose-Capillary: 95 mg/dL (ref 70–99)
Glucose-Capillary: 93 mg/dL (ref 70–99)
Glucose-Capillary: 214 mg/dL — ABNORMAL HIGH (ref 70–99)
Glucose-Capillary: 193 mg/dL — ABNORMAL HIGH (ref 70–99)

## 2023-07-06 LAB — RENAL FUNCTION PANEL
Albumin: 2.2 g/dL — ABNORMAL LOW (ref 3.5–5.0)
Anion gap: 15 (ref 5–15)
BUN: 78 mg/dL — ABNORMAL HIGH (ref 8–23)
CO2: 19 mmol/L — ABNORMAL LOW (ref 22–32)
Calcium: 8.3 mg/dL — ABNORMAL LOW (ref 8.9–10.3)
Chloride: 98 mmol/L (ref 98–111)
Creatinine, Ser: 4.83 mg/dL — ABNORMAL HIGH (ref 0.61–1.24)
GFR, Estimated: 12 mL/min — ABNORMAL LOW (ref >60)
Glucose, Bld: 91 mg/dL (ref 70–99)
Phosphorus: 5.5 mg/dL — ABNORMAL HIGH (ref 2.5–4.6)
Potassium: 3.8 mmol/L (ref 3.5–5.1)
Sodium: 132 mmol/L — ABNORMAL LOW (ref 135–145)

## 2023-07-06 LAB — MAGNESIUM: Magnesium: 1.5 mg/dL — ABNORMAL LOW (ref 1.7–2.4)

## 2023-07-06 SURGERY — INSERTION OF ARTERIOVENOUS (AV) ARTEGRAFT ARM
Anesthesia: General | Laterality: Left

## 2023-07-06 MED ORDER — SENNOSIDES-DOCUSATE SODIUM 8.6-50 MG PO TABS: 1.0000 | ORAL_TABLET | Freq: Two times a day (BID) | ORAL | Status: DC | PRN

## 2023-07-06 MED ORDER — FUROSEMIDE 40 MG PO TABS
40.0000 mg | ORAL_TABLET | Freq: Every day | ORAL | 0 refills | Status: AC
  Filled 2023-07-06 (×2): qty 30, 30d supply, fill #0

## 2023-07-06 NOTE — Progress Notes (Signed)
 Nutrition Education Note  RD consulted for Renal Education. Provided Nutrition for Dialysis handout from the [employer] to patient. Reviewed food groups and provided written recommended serving sizes specifically determined for patient's current nutritional status.   Explained why diet restrictions are needed and provided lists of foods to limit/avoid that are high potassium, sodium, and phosphorus. Provided specific recommendations on safer alternatives of these foods. Strongly encouraged compliance of this diet.   Discussed importance of protein intake at each meal and snack. Provided examples of how to maximize protein intake throughout the day. Discussed need for fluid restriction with dialysis, importance of minimizing weight gain between HD treatments, and renal-friendly beverage options.  Encouraged pt to discuss specific diet questions/concerns with RD at HD outpatient facility. Teach back method used.  Expect good compliance.  Body mass index is 22.09 kg/m. Pt meets criteria for normal based on current BMI.  Current diet order is Renal with 1200 mL fluid restriction, patient is consuming approximately 100% of meals at this time. Labs and medications reviewed. No further nutrition interventions warranted at this time. RD contact information provided. If additional nutrition issues arise, please re-consult RD.  Olivia Kenning, RD Registered Dietitian  See Amion for more information

## 2023-07-06 NOTE — Progress Notes (Signed)
D/C order noted. Contacted [facility] to be advised of pt's d/c today and that pt should resume care tomorrow.   Olivia Canter Renal Navigator [phone]

## 2023-07-06 NOTE — Discharge Planning (Signed)
 [facility] Patient Discharge Orders- [facility] CLINIC: [facility]  Patient's name: Walter Wilkerson Admit/DC Dates: 06/29/2023 - 07/06/2023  Discharge Diagnoses: Acute hypoxic respiratory failure 2/2 pulmonary edema+ pneumonia  Hypotension. HTN meds stopped. Midodrine  with HD   Recent Labs  Lab 07/06/23 0559  HGB 9.0*  K 3.8  CALCIUM  8.3*  PHOS 5.5*  ALBUMIN  2.2*   Outpatient Dialysis Orders:  -Heparin : None -EDW 66 kg   -[place]: 2K/2Ca  Anemia:  Aranesp : Given: Y   Date of last dose/amount: 6/21 40 mcg    PRBC's Given: -- Date/# of units: -- ESA dose for discharge:  60 mcg IV q 2 weks    Access intervention/Change: Plan for outpatient graft placement with [facility].    OTHER/APPTS/LABS     Completed by: Maisie Ronnald Acosta PA-C   D/C Meds to be reconciled by nurse after every discharge.    Reviewed by: MD:______ RN_______

## 2023-07-06 NOTE — Progress Notes (Signed)
 [facility] Progress Note   Subjective:    Seen in room WBC went up so surgery was cancelled Pt asking to go home possible today/ tomorrow   Objective Vitals:   07/05/23 2354 07/06/23 0300 07/06/23 0758 07/06/23 0829  BP: 108/63 102/61 105/66 105/65  Pulse:    83  Resp:   18 17  Temp: 97.6 F (36.4 C) 97.8 F (36.6 C) 97.6 F (36.4 C) 97.8 F (36.6 C)  TempSrc: Oral Oral Oral Oral  SpO2: 98% 100% 99% 97%  Weight:      Height:       Physical Exam General: NAD, [place] O2 3 L  Heart: RRR; no murmur Lungs: clear now Abdomen: soft ntnd  Extremities: no LE edema Dialysis Access:  TDC in R chest  OP HD: MWF SW has had only 1 outpatient HD session on 6/19 (got 2 hrs, 2.5 ordered) 3 -> 4h  B300 -> 400     78kg   2K  RIJ TDC  Heparin  none Hectorol 1 micrograms IV tts    Assessment/Plan: AHRF: Pulm edema + COPD + pneumonia, on abx and O2, improving with volume off with HD x 3. Down 11 kg since admission. On O2 4 L [place]. F/u CXR shows improved LLL infiltrate, no edema.  LLL pneumonia: On Ceftriaxone  + azithromycin . He has been immunosuppressed. F/u CXR 6/23 looked improved. No edema in any of the CXR's.  ESRD: recent start on dialysis, MWF schedule. Had HD here last Thursday and Friday, and this week on Monday and Wed.  HD access: has TDC. Pt was trying to decide between AVG vs PD. However, now surgery postponed due to rising WBC. He will f/u in OP setting w/ [facility].  HTN/volume: Off antihypertensives, bp's are stable.  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 WBC higher, Plts dropping. Secondary HPTH: CorrCa, Phos ok - no binder or VDRA for now. Nutrition: Continue renal diet, continue supplements. 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 Rituximan #2 05/30/23. pANCA titer 1:320 5/6 with UPC  300mg /g.  Decision not to offer more Rituximab  given complications with steroid cream jitteriness, fungal infection, puffy face and hyperglycemia requiring insulin  as well as advanced renal failure on 6/4. Prednisone  was lowered to 20 mg and Bactrim  discontinued.  Dispo: okay for dc from renal standpoint.    Myer Fret  MD  [facility] 07/06/2023, 12:32 PM  Recent Labs  Lab 07/05/23 0614 07/06/23 0559  HGB 8.9* 9.0*  ALBUMIN  2.4* 2.2*  CALCIUM  8.5* 8.3*  PHOS 4.5 5.5*  CREATININE 4.78* 4.83*  K 4.3 3.8    Inpatient medications:  (feeding supplement) PROSource Plus  30 mL Oral BID BM   atorvastatin   40 mg Oral Daily   budesonide -glycopyrrolate -formoterol   2 puff Inhalation BID   Chlorhexidine  Gluconate Cloth  6 each Topical Q0600   Chlorhexidine  Gluconate Cloth  6 each Topical Q0600   darbepoetin (ARANESP ) injection - DIALYSIS  40 mcg Subcutaneous Q Sat-1800   furosemide   40 mg Oral Daily   heparin   5,000 Units Subcutaneous Q8H   levothyroxine   175 mcg Oral QAC breakfast   melatonin  10 mg Oral QHS   pantoprazole   40 mg Oral Daily   predniSONE   20 mg Oral Q breakfast   sodium chloride  flush  3 mL Intravenous  Q12H     acetaminophen  **OR** acetaminophen , ipratropium-albuterol , polyethylene glycol

## 2023-07-06 NOTE — TOC CM/SW Note (Signed)
 Per RN documentation:   Patient agreeable to ambulate to determine oxygen saturation at rest and with supplemental oxygen if needed.  SaO2 on room air at rest = 84% SaO2 on room air while ambulating = 86% SaO2 on 4 liters of O2 while ambulating = 93%      07/06/23 1215 07/06/23 1216 07/06/23 1217  Oxygen Therapy  SpO2 (!) 84 % (!) 86 % (!) 70 %  O2 Device Room Air Nasal Cannula Nasal Cannula  O2 Flow Rate (L/min)  --  6 L/min 6 L/min      07/06/23 1218 07/06/23 1219 07/06/23 1220  Oxygen Therapy  SpO2 97 % (!) 86 % 93 %  O2 Device Nasal Cannula Nasal Cannula Nasal Cannula  O2 Flow Rate (L/min) 4 L/min 6 L/min 4 L/min      07/06/23 1221  Oxygen Therapy  SpO2 94 %  O2 Device Nasal Cannula  O2 Flow Rate (L/min) 4 L/min

## 2023-07-06 NOTE — Discharge Summary (Signed)
 Physician Discharge Summary  Walter Wilkerson FMW:[id number] DOB: 1950-04-16 DOA: 06/29/2023  PCP: Antoniette Vermell CROME, PA-C  Admit date: 06/29/2023 Discharge date: 07/06/23  Admitted From: Home Disposition: Home Recommendations for Outpatient Follow-up:  Follow up with PCP in 1 week  Outpatient follow-up with vascular surgery as below Check fluid status, BP, CMP and CBC at follow-up Please follow up on the following pending results:   Home Health: None Equipment/Devices: Home oxygen, 4 L by nasal cannula.  Discharge Condition: Stable CODE STATUS: Full code  Follow-up Information     [facility] Follow up.   Specialty: Vascular Surgery Why: 2-3 weeks. The office will call you with your appointment Contact information: [address] [place]  [address] [phone]        Antoniette Vermell CROME, [place]. Schedule an appointment as soon as possible for a visit in 1 week(s).   Specialty: Family Medicine Contact information: [address] [place] [address] [phone]         [facility] Follow up.   Why: (Adapt) home 02 arranged- they will also provide port for you to use w/ CPAP- portable 02 will be delivered to room for transport home. 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  Initially placed on BiPAP. Imaging showed evidence of fluid overload and possible left lower lobe pneumonia versus consolidative atelectasis. Nephrology was consulted and started emergent HD.   Also started on ceftriaxone  and Zithromax  for pneumonia.        See individual problem list below for more.   Problems addressed during this hospitalization Acute respiratory failure with hypoxia due to pulmonary edema and pneumonia.  Patient with ESRD and volume overloaded on presentation.  Had significant SOB at his first outpatient HD.  Received HD x 4 in 6 days.  Respiratory symptoms improved but required 4 L to maintain appropriate saturation with ambulation.  Cleared for discharge by nephrology on home oxygen.   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 Rituximan #2 05/30/23. pANCA titer 1:320 5/6 with UPC 300mg /g.  Decision not to offer more Rituximab  given complications with steroid cream jitteriness, fungal infection, puffy face and hyperglycemia requiring insulin  as well as advanced renal failure on 6/4. Prednisone  was lowered to 20 mg and Bactrim  discontinued. -thought to be triggered by chemotherapy in the past.  He is on steroids as an outpatient - Continue outpatient steroid -Patient has decided to proceed with permanent access for HD.  Vascular notified.  ESRD: recent start on dialysis, MWF schedule. Had HD here last Thursday and Friday, and this week on Monday and Wed.  Has TDC.  Vascular postponed surgery for permanent access due to leukocytosis although leukocytosis likely from steroid.  Patient is also undecided about AVG versus PD. -Vascular to arrange outpatient follow-up -Outpatient dialysis MWF   Possible LLL CAP -CT scan on admission with dense  left lower lobe consolidation with air bronchograms, severe pneumonia versus consolidative atelectasis.  He did have a cough but no fevers.  Repeat chest x-ray after 3 HD sessions has persistent infiltrates, suggesting true community-acquired pneumonia.  He is immunosuppressed.  Completed 5 days of ceftriaxone  and Zithromax  on  6/23.   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  Had transient symptoms this morning, now resolved.  Normal CT head.   Hypotension -requiring midodrine  prior to dialysis.  Normotensive off midodrine . -Discontinued home Coreg  and amlodipine .   Pancytopenia-overall stable, continue to monitor   Hypothyroidism-continue home Synthroid    History of lung cancer-status post resection   Leukocytosis: Likely due to steroid - Recheck CBC at follow-up.   Thoracic aortic aneurysm - Outpatient follow-up.         Time spent 35 minutes  Vital signs Vitals:   07/06/23 1219 07/06/23 1220 07/06/23 1221 07/06/23 1232  BP:    102/65  Pulse:    88  Temp:    97.9 F (36.6 C)  Resp:    18  Height:      Weight:      SpO2: (!) 86% 93% 94% 95%  TempSrc:    Oral  BMI (Calculated):         Discharge exam  GENERAL: No apparent distress.  Nontoxic. HEENT: MMM.  Vision and hearing grossly intact.  NECK: Supple.  No apparent JVD.  RESP:  No IWOB.  Fair aeration bilaterally. CVS:  RRR. Heart sounds normal.  ABD/GI/GU: BS+. Abd soft, NTND.  MSK/EXT:  Moves extremities. No apparent deformity. No edema.  SKIN: no apparent skin lesion or wound NEURO: Awake and alert. Oriented appropriately.  No apparent focal neuro deficit. PSYCH: Calm. Normal affect.   Discharge Instructions Discharge Instructions     Diet - low sodium heart healthy   Complete by: As directed    Discharge instructions   Complete by: As directed    It has been a pleasure taking care of you!  You were hospitalized due to difficulty breathing and low oxygen likely from fluid overload and pneumonia.  You have completed antibiotic course for pneumonia.  You received dialysis for fluid overload.  Continue dialysis outpatient.  Continue your breathing treatments incentive spirometry.  Follow-up with vascular surgery per the recommendation.   Take  care,   Increase activity slowly   Complete by: As directed    No wound care   Complete by: As directed       Allergies as of 07/06/2023       Reactions   Pregabalin Other (See Comments)   Elevated LFTs   Lisinopril Nausea Only   Merbromin Hives   Thimerosal (thiomersal) Hives   Ferrous Sulfate  Nausea Only, Other (See Comments), Nausea And Vomiting   325 mg once a day caused EXTREME NAUSEA        Medication List     STOP taking these medications    amLODipine  10 MG tablet Commonly known as: NORVASC    carvedilol  6.25 MG tablet Commonly known as: COREG        TAKE these medications    albuterol  108 (90 Base) MCG/ACT inhaler Commonly known as: VENTOLIN  HFA Inhale 2 puffs into the lungs every 4 (four) hours as needed. What changed: reasons to take this   aspirin  EC 81 MG tablet Take 81 mg by mouth daily.   atorvastatin  40 MG tablet Commonly known as: LIPITOR Take 1 tablet (  40 mg total) by mouth daily.   clindamycin 1 % external solution Commonly known as: CLEOCIN T Apply 1 Application topically 2 (two) times daily as needed (for scalp irritation).   clobetasol cream 0.05 % Commonly known as: TEMOVATE Apply 1 Application topically 2 (two) times daily as needed (for scalp irritation).   cyanocobalamin  1000 MCG tablet Commonly known as: VITAMIN B12 Take 1,000 mcg by mouth daily.   DULoxetine  30 MG capsule Commonly known as: CYMBALTA  Take 1 capsule (30 mg total) by mouth daily.   FreeStyle Libre 3 Sensor Misc 1 each by Does not apply route every 14 (fourteen) days. Please apply for 14 days and then switch to new sensor   furosemide  40 MG tablet Commonly known as: LASIX  Take 1 tablet (40 mg total) by mouth daily. Start taking on: July 07, 2023   Gvoke HypoPen  2-Pack 1 MG/0.2ML Soaj Generic drug: Glucagon  Inject 1mg  pen into outer thigh or lower abdomen if blood sugar less than 54.   levothyroxine  175 MCG tablet Commonly known as: SYNTHROID  Take 1  tablet (175 mcg total) by mouth daily before breakfast.   Melatonin 10 MG Tabs Take 10 mg by mouth at bedtime.   pantoprazole  40 MG tablet Commonly known as: PROTONIX  Take 1 tablet (40 mg total) by mouth daily.   predniSONE  20 MG tablet Commonly known as: DELTASONE  Take 20 mg by mouth daily with breakfast.   senna-docusate 8.6-50 MG tablet Commonly known as: Senokot-S Take 1-2 tablets by mouth 2 (two) times daily between meals as needed for mild constipation or moderate constipation.   sodium bicarbonate  650 MG tablet Take 1 tablet (650 mg total) by mouth 3 (three) times daily. What changed: how much to take   Toujeo  SoloStar 300 UNIT/ML Solostar Pen Generic drug: insulin  glargine (1 Unit Dial) Increase by 2 units every 5 days until fasting sugars 90-120. Max daily dosage 20 units What changed:  how much to take how to take this when to take this additional instructions   Trelegy Ellipta  100-62.5-25 MCG/ACT Aepb Generic drug: Fluticasone -Umeclidin-Vilant Inhale 1 puff into the lungs daily in the afternoon. What changed:  when to take this reasons to take this   Vitamin D  (Ergocalciferol ) 1.25 MG (50000 UNIT) Caps capsule Commonly known as: DRISDOL  Take 1 capsule (50,000 Units total) by mouth every 7 (seven) days.               Durable Medical Equipment  (From admission, onward)           Start     Ordered   07/06/23 1348  For home use only DME oxygen  Once       Comments: Please provide an enrichment port for 02 to be used w/ home CPAP  Question Answer Comment  Length of Need Lifetime   Mode or (Route) Nasal cannula   Liters per Minute 4   Frequency Continuous (stationary and portable oxygen unit needed)   Oxygen delivery system Gas      07/06/23 1347            Consultations: Nephrology Vascular surgery  Procedures/Studies:    CT HEAD WO CONTRAST ( ) Result Date: 07/04/2023 CLINICAL DATA:  Headache, neuro deficit EXAM: CT HEAD  WITHOUT CONTRAST TECHNIQUE: Contiguous axial images were obtained from the base of the skull through the vertex without intravenous contrast. RADIATION DOSE REDUCTION: This exam was performed according to the departmental dose-optimization program which includes automated exposure control, adjustment of the mA and/or kV according  to patient size and/or use of iterative reconstruction technique. COMPARISON:  CT of the head dated June 29, 2023. FINDINGS: Brain: Normal brain. No evidence of hemorrhage, mass, cortical infarct or hydrocephalus. Vascular: Mild to moderate calcific atheromatous disease within the carotid siphons. Skull: Intact and unremarkable. Sinuses/Orbits: Normal. Other: None. IMPRESSION: Normal brain. Electronically Signed   By: Evalene Coho M.D.   On: 07/04/2023 12:04   DG Chest 2 View Result Date: 07/03/2023 CLINICAL DATA:  Community acquired pneumonia. EXAM: CHEST - 2 VIEW COMPARISON:  Chest x-ray 06/29/2023.  CT of the chest 06/28/2023 per FINDINGS: Right-sided central venous catheter tip ends in the SVC. The heart is mildly enlarged. There is a trace left in the small right pleural effusion. There are patchy opacities in the left lung base. There is no pneumothorax or acute fracture. IMPRESSION: 1. Trace left and small right pleural effusions. 2. Patchy opacities in the left lung base may represent atelectasis or pneumonia. Electronically Signed   By: Greig Pique M.D.   On: 07/03/2023 21:40   CT Angio Chest PE W/Cm &/Or Wo Cm Result Date: 06/29/2023 CLINICAL DATA:  Respiratory distress. Acute, non localized abdominal pain. Previous right lower lobe resection. EXAM: CT ANGIOGRAPHY CHEST CT ABDOMEN AND PELVIS WITH CONTRAST TECHNIQUE: Multidetector CT imaging of the chest was performed using the standard protocol during bolus administration of intravenous contrast. Multiplanar CT image reconstructions and MIPs were obtained to evaluate the vascular anatomy. Multidetector CT imaging of  the abdomen and pelvis was performed using the standard protocol during bolus administration of intravenous contrast. RADIATION DOSE REDUCTION: This exam was performed according to the departmental dose-optimization program which includes automated exposure control, adjustment of the mA and/or kV according to patient size and/or use of iterative reconstruction technique. CONTRAST:  75mL OMNIPAQUE  IOHEXOL  350 MG/ML SOLN COMPARISON:  Portable chest obtained earlier today. Chest CTA dated 11/22/2022. PET-CT dated 04/18/2022. Abdomen and pelvis CT dated 06/01/2009. FINDINGS: CTA CHEST FINDINGS Cardiovascular: Normally opacified pulmonary arteries with no pulmonary arterial filling defects seen. Surgically absent right lower lobe pulmonary arteries. Prominent central pulmonary arteries with a main pulmonary artery diameter of 3.1 cm. Stable mildly enlarged heart. Minimal pericardial effusion with improvement. No significant residual pericardial fluid. Atheromatous calcifications, including the coronary arteries and aorta. Mediastinum/Nodes: No enlarged mediastinal, hilar, or axillary lymph nodes. Thyroid  gland, trachea, and esophagus demonstrate no significant findings. Lungs/Pleura: Dense left lower lobe consolidation with air bronchograms. Stable bilateral centrilobular and paraseptal bullous emphysema. Associated minimal diffuse bilateral ground glass opacity. Minimal bilateral pleural fluid. Mild right lower lobe atelectasis. Stable left lower lobe calcified granuloma. No lung nodules currently visualized. Musculoskeletal: Marked thoracic and moderate lower cervical spine degenerative changes. Review of the MIP images confirms the above findings. CT ABDOMEN and PELVIS FINDINGS Hepatobiliary: No focal liver abnormality is seen. No gallstones, gallbladder wall thickening, or biliary dilatation. Pancreas: Unremarkable. No pancreatic ductal dilatation or surrounding inflammatory changes. Spleen: Multiple calcified  splenic granulomata. Adrenals/Urinary Tract: Normal-appearing adrenal glands. Bilateral simple appearing renal cysts, not needing imaging follow-up. Mild increase in bilateral perinephric soft tissue stranding and fluid. Unremarkable ureters and urinary bladder. No urinary tract calculi or hydronephrosis seen. Stomach/Bowel: Stomach is within normal limits. Appendix appears normal. No evidence of bowel wall thickening, distention, or inflammatory changes. Vascular/Lymphatic: Atheromatous arterial calcifications without aneurysm. No enlarged lymph nodes. Reproductive: Mildly enlarged prostate. Other: Small amount of free peritoneal fluid. Extensive bilateral subcutaneous edema. Musculoskeletal: Lumbar and lower thoracic spine degenerative changes. Review of the MIP images confirms the  above findings. IMPRESSION: 1. No pulmonary emboli. 2. Dense left lower lobe consolidation with air bronchograms, compatible with severe pneumonia or consolidative atelectasis. 3. Minimal bilateral pleural fluid with minimal diffuse bilateral ground glass opacity, compatible with pulmonary edema. 4. Stable mild cardiomegaly. 5. Prominent central pulmonary arteries, suggesting pulmonary arterial hypertension. 6. Anasarca with a small amount of ascites, increased perinephric fluid bilaterally and diffuse subcutaneous edema. 7.  Calcific coronary artery and aortic atherosclerosis. 8. Extensive bilateral emphysema. 9. Mildly enlarged prostate. Aortic Atherosclerosis (ICD10-I70.0) and Emphysema (ICD10-J43.9). Electronically Signed   By: Elspeth Bathe M.D.   On: 06/29/2023 16:46   CT ABDOMEN PELVIS W CONTRAST Result Date: 06/29/2023 CLINICAL DATA:  Respiratory distress. Acute, non localized abdominal pain. Previous right lower lobe resection. EXAM: CT ANGIOGRAPHY CHEST CT ABDOMEN AND PELVIS WITH CONTRAST TECHNIQUE: Multidetector CT imaging of the chest was performed using the standard protocol during bolus administration of intravenous  contrast. Multiplanar CT image reconstructions and MIPs were obtained to evaluate the vascular anatomy. Multidetector CT imaging of the abdomen and pelvis was performed using the standard protocol during bolus administration of intravenous contrast. RADIATION DOSE REDUCTION: This exam was performed according to the departmental dose-optimization program which includes automated exposure control, adjustment of the mA and/or kV according to patient size and/or use of iterative reconstruction technique. CONTRAST:  75mL OMNIPAQUE  IOHEXOL  350 MG/ML SOLN COMPARISON:  Portable chest obtained earlier today. Chest CTA dated 11/22/2022. PET-CT dated 04/18/2022. Abdomen and pelvis CT dated 06/01/2009. FINDINGS: CTA CHEST FINDINGS Cardiovascular: Normally opacified pulmonary arteries with no pulmonary arterial filling defects seen. Surgically absent right lower lobe pulmonary arteries. Prominent central pulmonary arteries with a main pulmonary artery diameter of 3.1 cm. Stable mildly enlarged heart. Minimal pericardial effusion with improvement. No significant residual pericardial fluid. Atheromatous calcifications, including the coronary arteries and aorta. Mediastinum/Nodes: No enlarged mediastinal, hilar, or axillary lymph nodes. Thyroid  gland, trachea, and esophagus demonstrate no significant findings. Lungs/Pleura: Dense left lower lobe consolidation with air bronchograms. Stable bilateral centrilobular and paraseptal bullous emphysema. Associated minimal diffuse bilateral ground glass opacity. Minimal bilateral pleural fluid. Mild right lower lobe atelectasis. Stable left lower lobe calcified granuloma. No lung nodules currently visualized. Musculoskeletal: Marked thoracic and moderate lower cervical spine degenerative changes. Review of the MIP images confirms the above findings. CT ABDOMEN and PELVIS FINDINGS Hepatobiliary: No focal liver abnormality is seen. No gallstones, gallbladder wall thickening, or biliary  dilatation. Pancreas: Unremarkable. No pancreatic ductal dilatation or surrounding inflammatory changes. Spleen: Multiple calcified splenic granulomata. Adrenals/Urinary Tract: Normal-appearing adrenal glands. Bilateral simple appearing renal cysts, not needing imaging follow-up. Mild increase in bilateral perinephric soft tissue stranding and fluid. Unremarkable ureters and urinary bladder. No urinary tract calculi or hydronephrosis seen. Stomach/Bowel: Stomach is within normal limits. Appendix appears normal. No evidence of bowel wall thickening, distention, or inflammatory changes. Vascular/Lymphatic: Atheromatous arterial calcifications without aneurysm. No enlarged lymph nodes. Reproductive: Mildly enlarged prostate. Other: Small amount of free peritoneal fluid. Extensive bilateral subcutaneous edema. Musculoskeletal: Lumbar and lower thoracic spine degenerative changes. Review of the MIP images confirms the above findings. IMPRESSION: 1. No pulmonary emboli. 2. Dense left lower lobe consolidation with air bronchograms, compatible with severe pneumonia or consolidative atelectasis. 3. Minimal bilateral pleural fluid with minimal diffuse bilateral ground glass opacity, compatible with pulmonary edema. 4. Stable mild cardiomegaly. 5. Prominent central pulmonary arteries, suggesting pulmonary arterial hypertension. 6. Anasarca with a small amount of ascites, increased perinephric fluid bilaterally and diffuse subcutaneous edema. 7.  Calcific coronary artery and aortic atherosclerosis. 8.  Extensive bilateral emphysema. 9. Mildly enlarged prostate. Aortic Atherosclerosis (ICD10-I70.0) and Emphysema (ICD10-J43.9). Electronically Signed   By: Elspeth Bathe M.D.   On: 06/29/2023 16:46   CT Head Wo Contrast Result Date: 06/29/2023 CLINICAL DATA:  Head trauma, minor (Age >= 65y); Neck trauma (Age >= 65y). Fall yesterday, landing on the right side of the neck. Neck pain. EXAM: CT HEAD WITHOUT CONTRAST CT CERVICAL SPINE  WITHOUT CONTRAST TECHNIQUE: Multidetector CT imaging of the head and cervical spine was performed following the standard protocol without intravenous contrast. Multiplanar CT image reconstructions of the cervical spine were also generated. RADIATION DOSE REDUCTION: This exam was performed according to the departmental dose-optimization program which includes automated exposure control, adjustment of the mA and/or kV according to patient size and/or use of iterative reconstruction technique. COMPARISON:  None Available. FINDINGS: CT HEAD FINDINGS Brain: There is no evidence of an acute infarct, intracranial hemorrhage, mass, midline shift, or extra-axial fluid collection. Cerebral volume is normal. The ventricles are normal in size. Vascular: Calcified atherosclerosis at the skull base. No hyperdense vessel. Skull: No fracture or suspicious lesion. Sinuses/Orbits: Minimal mucosal thickening in the right maxillary sinus. Clear mastoid air cells. Unremarkable orbits. Other: None. CT CERVICAL SPINE FINDINGS Alignment: Mild reversal of the normal cervical lordosis. Grade 1 anterolisthesis of C2 on C3 and C3 on C4. Trace retrolisthesis of C5 on C6. Skull base and vertebrae: No acute fracture or suspicious lesion. Soft tissues and spinal canal: No prevertebral fluid or swelling. No visible canal hematoma. Disc levels: Advanced disc degeneration at C5-6 and moderate degeneration at C3-4 and C4-5. Advanced facet arthrosis on the right at C2-3 and on the left at C3-4. Moderate to severe neural foraminal stenosis at each of these levels. Suspected mild spinal stenosis at C3-4 and C4-5 and potentially moderate spinal stenosis at C5-6. Upper chest: Largely excluded. Please see the separate contemporaneous CTA of the chest for evaluation. Other: Moderate atherosclerotic calcification of the carotid bifurcations. Partially visualized right internal jugular venous catheter. IMPRESSION: 1. No evidence of acute intracranial  abnormality or cervical spine fracture. 2. Cervical disc and facet degeneration as above. Electronically Signed   By: Dasie Hamburg M.D.   On: 06/29/2023 16:14   CT Cervical Spine Wo Contrast Result Date: 06/29/2023 CLINICAL DATA:  Head trauma, minor (Age >= 65y); Neck trauma (Age >= 65y). Fall yesterday, landing on the right side of the neck. Neck pain. EXAM: CT HEAD WITHOUT CONTRAST CT CERVICAL SPINE WITHOUT CONTRAST TECHNIQUE: Multidetector CT imaging of the head and cervical spine was performed following the standard protocol without intravenous contrast. Multiplanar CT image reconstructions of the cervical spine were also generated. RADIATION DOSE REDUCTION: This exam was performed according to the departmental dose-optimization program which includes automated exposure control, adjustment of the mA and/or kV according to patient size and/or use of iterative reconstruction technique. COMPARISON:  None Available. FINDINGS: CT HEAD FINDINGS Brain: There is no evidence of an acute infarct, intracranial hemorrhage, mass, midline shift, or extra-axial fluid collection. Cerebral volume is normal. The ventricles are normal in size. Vascular: Calcified atherosclerosis at the skull base. No hyperdense vessel. Skull: No fracture or suspicious lesion. Sinuses/Orbits: Minimal mucosal thickening in the right maxillary sinus. Clear mastoid air cells. Unremarkable orbits. Other: None. CT CERVICAL SPINE FINDINGS Alignment: Mild reversal of the normal cervical lordosis. Grade 1 anterolisthesis of C2 on C3 and C3 on C4. Trace retrolisthesis of C5 on C6. Skull base and vertebrae: No acute fracture or suspicious lesion. Soft tissues and spinal  canal: No prevertebral fluid or swelling. No visible canal hematoma. Disc levels: Advanced disc degeneration at C5-6 and moderate degeneration at C3-4 and C4-5. Advanced facet arthrosis on the right at C2-3 and on the left at C3-4. Moderate to severe neural foraminal stenosis at each of  these levels. Suspected mild spinal stenosis at C3-4 and C4-5 and potentially moderate spinal stenosis at C5-6. Upper chest: Largely excluded. Please see the separate contemporaneous CTA of the chest for evaluation. Other: Moderate atherosclerotic calcification of the carotid bifurcations. Partially visualized right internal jugular venous catheter. IMPRESSION: 1. No evidence of acute intracranial abnormality or cervical spine fracture. 2. Cervical disc and facet degeneration as above. Electronically Signed   By: Dasie Hamburg M.D.   On: 06/29/2023 16:14   DG Chest Portable 1 View Result Date: 06/29/2023 CLINICAL DATA:  Increased work of breathing, chest injury EXAM: PORTABLE CHEST 1 VIEW COMPARISON:  06/27/2023 FINDINGS: Single frontal view of the chest demonstrates stable right internal jugular dialysis catheter. Cardiac silhouette is stable. New veiling opacity at the left lung base consistent with left lower lobe consolidation and/or effusion. No pneumothorax. Chronic elevation the right hemidiaphragm. IMPRESSION: 1. New left basilar veiling opacity consistent with left lower lobe consolidation and/or effusion. Electronically Signed   By: Ozell Daring M.D.   On: 06/29/2023 13:34   DG C-Arm 1-60 Min Result Date: 06/27/2023 CLINICAL DATA:  Dialysis catheter insertion EXAM: DG C-ARM 1-60 MIN FLUOROSCOPY: Fluoroscopy Time:  36 seconds Radiation Exposure Index (if provided by the fluoroscopic device): 4.64 mGy Number of Acquired Spot Images: 2 COMPARISON:  Same day chest radiograph FINDINGS: Two fluoroscopic images obtained during right IJ dialysis catheter insertion. Catheter tip projects over the superior cavoatrial junction. Please see performing physicians operative report for full details. IMPRESSION: Fluoroscopic images were obtained for intraoperative guidance of right IJ dialysis catheter insertion. Electronically Signed   By: Limin  Xu M.D.   On: 06/27/2023 12:11   DG Chest Port 1 View Result Date:  06/27/2023 CLINICAL DATA:  Postop dialysis catheter placement. EXAM: PORTABLE CHEST 1 VIEW COMPARISON:  Radiographs 05/03/2023 and 03/29/2023.  CT 11/22/2022. FINDINGS: 0852 hours. Interval right IJ hemodialysis catheter placement, tip at the lower SVC level. The heart size and mediastinal contours are stable with aortic atherosclerosis. Chronic elevation of the right hemidiaphragm with chronic blunting of the right costophrenic angle and mild scarring at both lung bases. No pneumothorax or significant pleural effusion. Old right-sided rib fractures noted without evidence of acute osseous abnormality. IMPRESSION: Interval right IJ hemodialysis catheter placement without evidence of complication. No acute cardiopulmonary process. Electronically Signed   By: Elsie Perone M.D.   On: 06/27/2023 09:33   VAS US  UPPER EXTREMITY ARTERIAL DUPLEX Result Date: 06/21/2023  UPPER EXTREMITY DUPLEX STUDY Patient Name:  Aurthur Wingerter  Date of Exam:   06/21/2023 Medical Rec #: [id number]       Accession #:    [id number] Date of Birth: [birth year]       Patient Gender: M Patient Age:   36 years Exam Location:  [address] Procedure:      VAS US  UPPER EXTREMITY ARTERIAL DUPLEX Referring Phys: JOSHUA ROBINS --------------------------------------------------------------------------------  Indications: End Stage Renal Disease.  Risk Factors: Hypertension, past history of smoking. Comparison Study: NA Performing Technologist: Nanetta Shad RVT  Examination Guidelines: A complete evaluation includes B-mode imaging, spectral Doppler, color Doppler, and power Doppler as needed of all accessible portions of each vessel. Bilateral testing is considered an integral part of a complete examination. Limited  examinations for reoccurring indications may be performed as noted.  Right Pre-Dialysis Findings: +-----------------------+----------+--------------------+---------+--------+ Location               PSV (cm/s)Intralum. Diam.  (cm)Waveform Comments +-----------------------+----------+--------------------+---------+--------+ Brachial Antecub. fossa85        0.57                triphasic         +-----------------------+----------+--------------------+---------+--------+ Radial Art at Wrist    41        0.26                triphasic         +-----------------------+----------+--------------------+---------+--------+ Ulnar Art at Wrist     104       0.28                triphasic         +-----------------------+----------+--------------------+---------+--------+  Left Pre-Dialysis Findings: +-----------------------+----------+--------------------+-----------+--------+ Location               PSV (cm/s)Intralum. Diam. (cm)Waveform   Comments +-----------------------+----------+--------------------+-----------+--------+ Brachial Antecub. fossa71        0.64                triphasic           +-----------------------+----------+--------------------+-----------+--------+ Radial Art at Wrist    39        0.23                multiphasic         +-----------------------+----------+--------------------+-----------+--------+ Ulnar Art at Wrist     84        0.28                triphasic           +-----------------------+----------+--------------------+-----------+--------+  Summary:  Right: No obstruction visualized in the right upper extremity. Left: No obstruction visualized in the left upper extremity. *See table(s) above for measurements and observations. Electronically signed by Penne Colorado MD on 06/21/2023 at 5:09:24 PM.    Final    VAS US  UPPER EXT VEIN MAPPING (PRE-OP  AVF) Result Date: 06/21/2023 UPPER EXTREMITY VEIN MAPPING Patient Name:  Dominyk Law  Date of Exam:   06/21/2023 Medical Rec #: [id number]       Accession #:    [id number] Date of Birth: 05-23-1950       Patient Gender: M Patient Age:   37 years Exam Location:  [address] Procedure:      VAS US  UPPER EXT VEIN MAPPING (PRE-OP   AVF) Referring Phys: JOSHUA ROBINS --------------------------------------------------------------------------------  Indications: Pre-access. History: End Stage Renal Disease.  Comparison Study: NA Performing Technologist: Nanetta Shad RVT  Examination Guidelines: A complete evaluation includes B-mode imaging, spectral Doppler, color Doppler, and power Doppler as needed of all accessible portions of each vessel. Bilateral testing is considered an integral part of a complete examination. Limited examinations for reoccurring indications may be performed as noted. +-----------------+-------------+----------+--------+ Right Cephalic   Diameter (cm)Depth (cm)Findings +-----------------+-------------+----------+--------+ Shoulder             0.26                        +-----------------+-------------+----------+--------+ Prox upper arm       0.17                        +-----------------+-------------+----------+--------+ Mid upper arm        0.10                        +-----------------+-------------+----------+--------+  Dist upper arm       0.22                        +-----------------+-------------+----------+--------+ Antecubital fossa    0.27                        +-----------------+-------------+----------+--------+ Prox forearm         0.17                        +-----------------+-------------+----------+--------+ Mid forearm          0.10                        +-----------------+-------------+----------+--------+ Wrist                0.15                        +-----------------+-------------+----------+--------+ +-----------------+-------------+----------+--------+ Right Basilic    Diameter (cm)Depth (cm)Findings +-----------------+-------------+----------+--------+ Shoulder             0.70                        +-----------------+-------------+----------+--------+ Prox upper arm       0.31                         +-----------------+-------------+----------+--------+ Mid upper arm        0.42                        +-----------------+-------------+----------+--------+ Dist upper arm       0.21                        +-----------------+-------------+----------+--------+ Antecubital fossa    0.19                        +-----------------+-------------+----------+--------+ Prox forearm         0.14                        +-----------------+-------------+----------+--------+ Mid forearm          0.14                        +-----------------+-------------+----------+--------+ Wrist                0.13                        +-----------------+-------------+----------+--------+ +-----------------+-------------+----------+--------+ Left Cephalic    Diameter (cm)Depth (cm)Findings +-----------------+-------------+----------+--------+ Shoulder             0.10                        +-----------------+-------------+----------+--------+ Prox upper arm       0.08                        +-----------------+-------------+----------+--------+ Mid upper arm        0.08                        +-----------------+-------------+----------+--------+ Dist upper arm       0.16                        +-----------------+-------------+----------+--------+  Antecubital fossa    0.09                        +-----------------+-------------+----------+--------+ Prox forearm         0.10                        +-----------------+-------------+----------+--------+ Mid forearm          0.10                        +-----------------+-------------+----------+--------+ Wrist                0.09                        +-----------------+-------------+----------+--------+ +-----------------+-------------+----------+--------+ Left Basilic     Diameter (cm)Depth (cm)Findings +-----------------+-------------+----------+--------+ Shoulder             0.42                         +-----------------+-------------+----------+--------+ Prox upper arm       0.21                        +-----------------+-------------+----------+--------+ Mid upper arm        0.23                        +-----------------+-------------+----------+--------+ Dist upper arm       0.21                        +-----------------+-------------+----------+--------+ Antecubital fossa    0.17                        +-----------------+-------------+----------+--------+ Prox forearm         0.15                        +-----------------+-------------+----------+--------+ Mid forearm          0.10                        +-----------------+-------------+----------+--------+ Wrist                0.09                        +-----------------+-------------+----------+--------+ Summary: Right: Patent cephalic and basilic veins, with measurements as        noted above. Left: Patent cephalic and basilic veins, with measurements as       noted above. *See table(s) above for measurements and observations.  Diagnosing physician: Penne Colorado MD Electronically signed by Penne Colorado MD on 06/21/2023 at 5:08:22 PM.    Final        The results of significant diagnostics from this hospitalization (including imaging, microbiology, ancillary and laboratory) are listed below for reference.     Microbiology: Recent Results (from the past 240 hours)  Resp panel by RT-PCR (RSV, Flu A&B, Covid) Anterior Nasal Swab     Status: None   Collection Time: 06/29/23 12:21 PM   Specimen: Anterior Nasal Swab  Result Value Ref Range Status   SARS Coronavirus 2 by RT PCR NEGATIVE NEGATIVE Final   Influenza A by PCR NEGATIVE NEGATIVE Final   Influenza B  by PCR NEGATIVE NEGATIVE Final    Comment: (NOTE) The Xpert Xpress SARS-CoV-2/FLU/RSV plus assay is intended as an aid in the diagnosis of influenza from Nasopharyngeal swab specimens and should not be used as a sole basis for treatment. Nasal washings  and aspirates are unacceptable for Xpert Xpress SARS-CoV-2/FLU/RSV testing.  Fact Sheet for Patients: BloggerCourse.com  Fact Sheet for Healthcare Providers: SeriousBroker.it  This test is not yet approved or cleared by the [place]  FDA and has been authorized for detection and/or diagnosis of SARS-CoV-2 by FDA under an Emergency Use Authorization (EUA). This EUA will remain in effect (meaning this test can be used) for the duration of the COVID-19 declaration under Section 564(b)(1) of the Act, 21 U.S.C. section 360bbb-3(b)(1), unless the authorization is terminated or revoked.     Resp Syncytial Virus by PCR NEGATIVE NEGATIVE Final    Comment: (NOTE) Fact Sheet for Patients: BloggerCourse.com  Fact Sheet for Healthcare Providers: SeriousBroker.it  This test is not yet approved or cleared by the [place]  FDA and has been authorized for detection and/or diagnosis of SARS-CoV-2 by FDA under an Emergency Use Authorization (EUA). This EUA will remain in effect (meaning this test can be used) for the duration of the COVID-19 declaration under Section 564(b)(1) of the Act, 21 U.S.C. section 360bbb-3(b)(1), unless the authorization is terminated or revoked.  Performed at [facility], [address], [place], [place] [address]   Blood culture (routine x 2)     Status: None   Collection Time: 06/29/23  5:47 PM   Specimen: BLOOD RIGHT WRIST  Result Value Ref Range Status   Specimen Description BLOOD RIGHT WRIST  Final   Special Requests   Final    BOTTLES DRAWN AEROBIC AND ANAEROBIC Blood Culture adequate volume   Culture   Final    NO GROWTH 5 DAYS Performed at [facility], [address], [place], [place] [address]    Report Status 07/04/2023 FINAL  Final  Blood culture (routine x 2)     Status: None   Collection Time: 06/29/23  6:32 PM   Specimen:  BLOOD RIGHT WRIST  Result Value Ref Range Status   Specimen Description BLOOD RIGHT WRIST  Final   Special Requests   Final    BOTTLES DRAWN AEROBIC AND ANAEROBIC Blood Culture results may not be optimal due to an inadequate volume of blood received in culture bottles   Culture   Final    NO GROWTH 5 DAYS Performed at [facility], [address] [place], [place] [address]    Report Status 07/04/2023 FINAL  Final     Labs:  CBC: Recent Labs  Lab 06/30/23 1428 07/02/23 0712 07/03/23 1359 07/05/23 0614 07/06/23 0559  WBC 9.3 14.8* 11.4* 15.4* 19.2*  HGB 7.7* 7.3* 8.3* 8.9* 9.0*  HCT 22.7* 22.4* 25.9* 26.9* 27.4*  MCV 90.1 93.7 94.2 92.1 91.6  PLT 86* 74* 93* 141* 167   BMP &GFR Recent Labs  Lab 06/30/23 1428 07/02/23 0712 07/03/23 1359 07/05/23 0614 07/06/23 0559  NA 135 137 137 135 132*  K 4.3 4.0 3.3* 4.3 3.8  CL 99 100 100 98 98  CO2 23 26 22  21* 19*  GLUCOSE 119* 115* 142* 90 91  BUN 45* 63* 86* 68* 78*  CREATININE 3.18* 4.02* 4.70* 4.78* 4.83*  CALCIUM  7.7* 8.6* 8.0* 8.5* 8.3*  MG  --   --   --  1.7 1.5*  PHOS 4.4 4.2 3.1 4.5  5.5*   Estimated Creatinine Clearance: 12.7 mL/min (A) (by C-G formula based on SCr of 4.83 mg/dL (H)). Liver & Pancreas: Recent Labs  Lab 06/30/23 1428 07/02/23 0712 07/03/23 1359 07/05/23 0614 07/06/23 0559  AST  --   --   --  10*  --   ALT  --   --   --  10  --   ALKPHOS  --   --   --  207*  --   BILITOT  --   --   --  0.6  --   PROT  --   --   --  5.9*  --   ALBUMIN  2.3* 2.3* 2.5* 2.4* 2.2*   No results for input(s): LIPASE, AMYLASE in the last 168 hours. No results for input(s): AMMONIA in the last 168 hours. Diabetic: No results for input(s): HGBA1C in the last 72 hours. Recent Labs  Lab 07/05/23 1912 07/05/23 2351 07/06/23 0321 07/06/23 0805 07/06/23 1152  GLUCAP 130* 152* 95 93 214*   Cardiac Enzymes: No results for input(s): CKTOTAL, CKMB, CKMBINDEX, TROPONINI in the last 168  hours. No results for input(s): PROBNP in the last 8760 hours. Coagulation Profile: No results for input(s): INR, PROTIME in the last 168 hours. Thyroid  Function Tests: No results for input(s): TSH, T4TOTAL, FREET4, T3FREE, THYROIDAB in the last 72 hours. Lipid Profile: No results for input(s): CHOL, HDL, LDLCALC, TRIG, CHOLHDL, LDLDIRECT in the last 72 hours. Anemia Panel: No results for input(s): VITAMINB12, FOLATE, FERRITIN, TIBC, IRON , RETICCTPCT in the last 72 hours. Urine analysis:    Component Value Date/Time   COLORURINE YELLOW 05/03/2023 0900   APPEARANCEUR CLEAR 05/03/2023 0900   LABSPEC 1.009 05/03/2023 0900   PHURINE 6.0 05/03/2023 0900   GLUCOSEU NEGATIVE 05/03/2023 0900   HGBUR LARGE (A) 05/03/2023 0900   BILIRUBINUR NEGATIVE 05/03/2023 0900   KETONESUR NEGATIVE 05/03/2023 0900   PROTEINUR 30 (A) 05/03/2023 0900   NITRITE NEGATIVE 05/03/2023 0900   LEUKOCYTESUR NEGATIVE 05/03/2023 0900   Sepsis Labs: Invalid input(s): PROCALCITONIN, LACTICIDVEN   SIGNED:  Danee Soller T Ezekiel Menzer, MD  [facility] 07/06/2023, 1:58 PM

## 2023-07-06 NOTE — Progress Notes (Signed)
 Patient agreeable to ambulate to determine oxygen saturation at rest and with supplemental oxygen if needed.  SaO2 on room air at rest = 84% SaO2 on room air while ambulating = 86% SaO2 on 4 liters of O2 while ambulating = 93%    07/06/23 1215 07/06/23 1216 07/06/23 1217  Oxygen Therapy  SpO2 (!) 84 % (!) 86 % (!) 70 %  O2 Device Room Air Nasal Cannula Nasal Cannula  O2 Flow Rate (L/min)  --  6 L/min 6 L/min    07/06/23 1218 07/06/23 1219 07/06/23 1220  Oxygen Therapy  SpO2 97 % (!) 86 % 93 %  O2 Device Nasal Cannula Nasal Cannula Nasal Cannula  O2 Flow Rate (L/min) 4 L/min 6 L/min 4 L/min    07/06/23 1221  Oxygen Therapy  SpO2 94 %  O2 Device Nasal Cannula  O2 Flow Rate (L/min) 4 L/min

## 2023-07-06 NOTE — TOC Transition Note (Addendum)
 Transition of Care (TOC) - Discharge Note [name] Gobble RN,BSN Transitions of Care Unit 4NP (Non Trauma)- RN Case Manager See Treatment Team for direct Phone #   Patient Details  Name: Walter Wilkerson MRN: [id number] Date of Birth: 09/04/1950  Transition of Care [facility]) CM/SW Contact:  Gobble [name] Hurst, RN Phone Number: 07/06/2023, 1:49 PM   Clinical Narrative:    Pt stable for transition home today, Order placed for home 02 needs.  Pt already active with [employer] for home CPAP.  Spoke with pt who want to see if [employer] can adapt CPAP to use w/ 02.   Call made to [employer] liaison for home 02 referral and adaption needed for CPAP for 02. Liaison confirmed CPAP can adapt for 02 needs- just needs extension port- [employer] to follow up for home 02 and CPAP port delivery- portable 02 will be delivered to pt prior to discharge.   Family to transport home,  Pt will resume his outpt HD tomorrow per renal note.       Final next level of care: Home/Self Care Barriers to Discharge: Barriers Resolved   Patient Goals and CMS Choice Patient states their goals for this hospitalization and ongoing recovery are:: return home CMS Medicare.gov Compare Post Acute Care list provided to:: Patient Choice offered to / list presented to : Patient      Discharge Placement               Home        Discharge Plan and Services Additional resources added to the After Visit Summary for     Discharge Planning Services: CM Consult Post Acute Care Choice: Durable Medical Equipment          DME Arranged: Oxygen DME Agency: [employer] Date DME Agency Contacted: 07/06/23 Time DME Agency Contacted: 1348 Representative spoke with at DME Agency: Mitch HH Arranged: NA HH Agency: NA        Social Drivers of Health (SDOH) Interventions SDOH Screenings   Food Insecurity: No Food Insecurity (07/01/2023)  Housing: Low Risk  (07/01/2023)  Transportation Needs: No Transportation Needs (07/01/2023)   Utilities: Not At Risk (07/01/2023)  Alcohol Screen: Low Risk  (04/19/2023)  Depression (PHQ2-9): Low Risk  (04/19/2023)  Financial Resource Strain: Low Risk  (04/19/2023)  Physical Activity: Insufficiently Active (04/19/2023)  Social Connections: Unknown (07/01/2023)  Stress: No Stress Concern Present (04/19/2023)  Tobacco Use: Medium Risk (06/29/2023)  Health Literacy: Adequate Health Literacy (04/19/2023)     Readmission Risk Interventions    07/06/2023    1:48 PM  Readmission Risk Prevention Plan  Transportation Screening Complete  HRI or Home Care Consult Complete  Social Work Consult for Recovery Care Planning/Counseling Complete  Palliative Care Screening Not Applicable  Medication Review Oceanographer) Complete

## 2023-07-06 NOTE — Progress Notes (Signed)
  Progress Note    07/06/2023 8:09 AM * No surgery found *  Pt with ESRD in need of permanent dialysis access Has TDC already placed that is functioning well He was scheduled for LUE AV graft in OR today with Dr. Magda Unfortunately he has worsening leukocytosis this morning 19k It was discussed with patient yesterday that we would cancel his surgery if leukocytosis worsened. He is understanding Okay for him to eat today Will arrange follow up in our office in a couple weeks to allow him to recover from his current hospitalization and we can reschedule him for access in the outpatient setting   Teretha Charlyne [facility] [phone] 07/06/2023 8:09 AM

## 2023-07-06 NOTE — Progress Notes (Signed)
 Nursing Discharge Note   Name: Walter Wilkerson MRN: [id number] DOB: 09/28/1950    Admit Date:  06/29/2023  Discharge Date:  07/06/2023   Walter Wilkerson is to be discharged home per MD order.  AVS completed. Reviewed with patient and wife/ [name] at bedside. Highlighted copy provided for patient to take home.  Patient and wife able to verbalize understanding of discharge instructions. PIV removed. Patient stable upon discharge.  [facility] delivered new home oxygen to patient's room. Case Manager/ Kristi confirmed that [employer] would also be able to provide an adapter port for current CPAP machine to be able to attach oxygen to CPAP machine for use at bedtime.  TOC medications obtained from [facility] for discharge home.    Discharge Instructions   None      Discharge Instructions     Diet - low sodium heart healthy   Complete by: As directed    Discharge instructions   Complete by: As directed    It has been a pleasure taking care of you!  You were hospitalized due to difficulty breathing and low oxygen likely from fluid overload and pneumonia.  You have completed antibiotic course for pneumonia.  You received dialysis for fluid overload.  Continue dialysis outpatient.  Continue your breathing treatments incentive spirometry.  Follow-up with vascular surgery per the recommendation.   Take care,   Increase activity slowly   Complete by: As directed    No wound care   Complete by: As directed         Allergies as of 07/06/2023       Reactions   Pregabalin Other (See Comments)   Elevated LFTs   Lisinopril Nausea Only   Merbromin Hives   Thimerosal (thiomersal) Hives   Ferrous Sulfate  Nausea Only, Other (See Comments), Nausea And Vomiting   325 mg once a day caused EXTREME NAUSEA        Medication List     STOP taking these medications    amLODipine  10 MG tablet Commonly known as: NORVASC    carvedilol  6.25 MG tablet Commonly known as: COREG         TAKE these medications    albuterol  108 (90 Base) MCG/ACT inhaler Commonly known as: VENTOLIN  HFA Inhale 2 puffs into the lungs every 4 (four) hours as needed. What changed: reasons to take this   aspirin  EC 81 MG tablet Take 81 mg by mouth daily.   atorvastatin  40 MG tablet Commonly known as: LIPITOR Take 1 tablet (40 mg total) by mouth daily.   clindamycin 1 % external solution Commonly known as: CLEOCIN T Apply 1 Application topically 2 (two) times daily as needed (for scalp irritation).   clobetasol cream 0.05 % Commonly known as: TEMOVATE Apply 1 Application topically 2 (two) times daily as needed (for scalp irritation).   cyanocobalamin  1000 MCG tablet Commonly known as: VITAMIN B12 Take 1,000 mcg by mouth daily.   DULoxetine  30 MG capsule Commonly known as: CYMBALTA  Take 1 capsule (30 mg total) by mouth daily.   FreeStyle Libre 3 Sensor Misc 1 each by Does not apply route every 14 (fourteen) days. Please apply for 14 days and then switch to new sensor   furosemide  40 MG tablet Commonly known as: LASIX  Take 1 tablet (40 mg total) by mouth daily. Start taking on: July 07, 2023   Gvoke HypoPen  2-Pack 1 MG/0.2ML Soaj Generic drug: Glucagon  Inject 1mg  pen into outer thigh or lower abdomen if blood sugar less than 54.  levothyroxine  175 MCG tablet Commonly known as: SYNTHROID  Take 1 tablet (175 mcg total) by mouth daily before breakfast.   Melatonin 10 MG Tabs Take 10 mg by mouth at bedtime.   pantoprazole  40 MG tablet Commonly known as: PROTONIX  Take 1 tablet (40 mg total) by mouth daily.   predniSONE  20 MG tablet Commonly known as: DELTASONE  Take 20 mg by mouth daily with breakfast.   senna-docusate 8.6-50 MG tablet Commonly known as: Senokot-S Take 1-2 tablets by mouth 2 (two) times daily between meals as needed for mild constipation or moderate constipation.   sodium bicarbonate  650 MG tablet Take 1 tablet (650 mg total) by mouth 3 (three) times  daily. What changed: how much to take   Toujeo  SoloStar 300 UNIT/ML Solostar Pen Generic drug: insulin  glargine (1 Unit Dial) Increase by 2 units every 5 days until fasting sugars 90-120. Max daily dosage 20 units What changed:  how much to take how to take this when to take this additional instructions   Trelegy Ellipta  100-62.5-25 MCG/ACT Aepb Generic drug: Fluticasone -Umeclidin-Vilant Inhale 1 puff into the lungs daily in the afternoon. What changed:  when to take this reasons to take this   Vitamin D  (Ergocalciferol ) 1.25 MG (50000 UNIT) Caps capsule Commonly known as: DRISDOL  Take 1 capsule (50,000 Units total) by mouth every 7 (seven) days.               Durable Medical Equipment  (From admission, onward)           Start     Ordered   07/06/23 1348  For home use only DME oxygen  Once       Comments: Please provide an enrichment port for 02 to be used w/ home CPAP  Question Answer Comment  Length of Need Lifetime   Mode or (Route) Nasal cannula   Liters per Minute 4   Frequency Continuous (stationary and portable oxygen unit needed)   Oxygen delivery system Gas      07/06/23 1347             Discharge Instructions/ Education: An After Visit Summary was printed and given to the patient. Discharge instructions given to patient/family with verbalized understanding. Discharge education completed with patient/family including: follow up instructions, medication list, discharge activities, and limitations if indicated.  Additional discharge instructions as indicated by discharging provider also reviewed.  Patient and family able to verbalize understanding, all questions fully answered. Patient instructed to return to Emergency Department, call 911, or call MD for any changes in condition.   Patient escorted via wheelchair to lobby and discharged home via private automobile.

## 2023-07-07 ENCOUNTER — Telehealth: Payer: Self-pay

## 2023-07-07 ENCOUNTER — Telehealth: Payer: Self-pay | Admitting: Nephrology

## 2023-07-07 DIAGNOSIS — D509 Iron deficiency anemia, unspecified: Secondary | ICD-10-CM | POA: Diagnosis not present

## 2023-07-07 DIAGNOSIS — D689 Coagulation defect, unspecified: Secondary | ICD-10-CM | POA: Diagnosis not present

## 2023-07-07 DIAGNOSIS — I953 Hypotension of hemodialysis: Secondary | ICD-10-CM | POA: Diagnosis not present

## 2023-07-07 DIAGNOSIS — E211 Secondary hyperparathyroidism, not elsewhere classified: Secondary | ICD-10-CM | POA: Diagnosis not present

## 2023-07-07 DIAGNOSIS — N186 End stage renal disease: Secondary | ICD-10-CM | POA: Diagnosis not present

## 2023-07-07 DIAGNOSIS — Z992 Dependence on renal dialysis: Secondary | ICD-10-CM | POA: Diagnosis not present

## 2023-07-07 NOTE — Transitions of Care (Post Inpatient/ED Visit) (Signed)
   07/07/2023  Name: Walter Wilkerson MRN: [id number] DOB: 01/15/1950  Today's TOC FU Call Status: Today's TOC FU Call Status:: Unsuccessful Call (1st Attempt) Unsuccessful Call (1st Attempt) Date: 07/07/23  Attempted to reach the patient regarding the most recent Inpatient/ED visit.  Follow Up Plan: Additional outreach attempts will be made to reach the patient to complete the Transitions of Care (Post Inpatient/ED visit) call.   Shona Prow RN, CCM [place]  [facility] RN Care Manager [phone]

## 2023-07-07 NOTE — Telephone Encounter (Signed)
 Transition of Care - Initial Contact after Hospitalization  Date of discharge: 07/06/23 Date of contact: 07/07/23  Method: Phone Spoke to: Patient  Patient contacted to discuss transition of care from recent inpatient hospitalization. Patient was admitted to [facility] from 6/19-6/26/25  with discharge diagnosis of acute hypoxic respiratory failure   The discharge medication list was reviewed.   Patient will return to his/her outpatient HD unit on: Monday 6/30  No other concerns at this time.

## 2023-07-10 ENCOUNTER — Telehealth: Payer: Self-pay | Admitting: *Deleted

## 2023-07-10 DIAGNOSIS — J9601 Acute respiratory failure with hypoxia: Secondary | ICD-10-CM | POA: Diagnosis not present

## 2023-07-10 DIAGNOSIS — I953 Hypotension of hemodialysis: Secondary | ICD-10-CM | POA: Diagnosis not present

## 2023-07-10 DIAGNOSIS — N037 Chronic nephritic syndrome with diffuse crescentic glomerulonephritis: Secondary | ICD-10-CM | POA: Diagnosis not present

## 2023-07-10 DIAGNOSIS — D509 Iron deficiency anemia, unspecified: Secondary | ICD-10-CM | POA: Diagnosis not present

## 2023-07-10 DIAGNOSIS — D689 Coagulation defect, unspecified: Secondary | ICD-10-CM | POA: Diagnosis not present

## 2023-07-10 DIAGNOSIS — Z992 Dependence on renal dialysis: Secondary | ICD-10-CM | POA: Diagnosis not present

## 2023-07-10 DIAGNOSIS — N186 End stage renal disease: Secondary | ICD-10-CM | POA: Diagnosis not present

## 2023-07-10 DIAGNOSIS — E211 Secondary hyperparathyroidism, not elsewhere classified: Secondary | ICD-10-CM | POA: Diagnosis not present

## 2023-07-10 NOTE — Transitions of Care (Post Inpatient/ED Visit) (Signed)
   07/10/2023  Name: Walter Wilkerson MRN: [id number] DOB: Jun 02, 1950  Today's TOC FU Call Status: Today's TOC FU Call Status:: Unsuccessful Call (2nd Attempt) Unsuccessful Call (2nd Attempt) Date: 07/10/23  Attempted to reach the patient regarding the most recent Inpatient/ED visit.  Follow Up Plan: Additional outreach attempts will be made to reach the patient to complete the Transitions of Care (Post Inpatient/ED visit) call.   Andrea Dimes RN, BSN [place]  [facility] RN Care Manager [phone]

## 2023-07-11 ENCOUNTER — Other Ambulatory Visit: Payer: Self-pay

## 2023-07-11 ENCOUNTER — Encounter: Payer: Self-pay | Admitting: Family Medicine

## 2023-07-11 ENCOUNTER — Ambulatory Visit (INDEPENDENT_AMBULATORY_CARE_PROVIDER_SITE_OTHER): Admitting: Family Medicine

## 2023-07-11 ENCOUNTER — Telehealth: Payer: Self-pay | Admitting: *Deleted

## 2023-07-11 VITALS — BP 116/59 | HR 83 | Ht 68.0 in | Wt 160.1 lb

## 2023-07-11 DIAGNOSIS — J432 Centrilobular emphysema: Secondary | ICD-10-CM | POA: Diagnosis not present

## 2023-07-11 DIAGNOSIS — N186 End stage renal disease: Secondary | ICD-10-CM | POA: Diagnosis not present

## 2023-07-11 DIAGNOSIS — J189 Pneumonia, unspecified organism: Secondary | ICD-10-CM

## 2023-07-11 DIAGNOSIS — E161 Other hypoglycemia: Secondary | ICD-10-CM

## 2023-07-11 DIAGNOSIS — I10 Essential (primary) hypertension: Secondary | ICD-10-CM

## 2023-07-11 NOTE — Transitions of Care (Post Inpatient/ED Visit) (Signed)
   07/11/2023  Name: Walter Wilkerson MRN: [id number] DOB: Feb 07, 1950  Today's TOC FU Call Status: Today's TOC FU Call Status:: Unsuccessful Call (3rd Attempt) Unsuccessful Call (3rd Attempt) Date: 07/11/23  Attempted to reach the patient regarding the most recent Inpatient/ED visit.  Follow Up Plan: No further outreach attempts will be made at this time. We have been unable to contact the patient.  Andrea Dimes RN, BSN [place]  [facility] RN Care Manager [phone]

## 2023-07-11 NOTE — Progress Notes (Signed)
 Established Patient Office Visit  Subjective  Patient ID: Walter Wilkerson, male    DOB: 10/28/1950  Age: 73 y.o. MRN: [id number]  Chief Complaint  Patient presents with   Hospitalization Follow-up    HPI  He is here today for hospital follow-up he was initially admitted on the 19th after recent new diagnosis of end-stage renal disease and starting dialysis.  He was diagnosed with volume overload and left lower lobe pneumonia.  He was discharged home on June 26 after a 1 week hospital stay.  They had recommended outpatient follow-up with vascular surgery and repeat CMP and CBC at follow-up as well.  He was discharged home on 4 L of oxygen and says he has been watching his oxygen very carefully and he is just using it as needed.  He is using it at night along with his CPAP.  Hospital course 73 year old M with PMH of progressive glomerulonephritis, new ESRD recently started on HD, lung cancer s/p RLL lobectomy, HTN, hypothyroidism, anemia, thrombocytopenia and thoracic arctic aneurysm  presenting with SOB at his first outpatient HD, and admitted with acute hypoxic RF in the setting of fluid overload and LLL pneumonia.  Initially placed on BiPAP. Imaging showed evidence of fluid overload and possible left lower lobe pneumonia versus consolidative atelectasis. Nephrology was consulted and started emergent HD.  Also started on ceftriaxone  and Zithromax  for pneumonia.   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     ROS    Objective:     BP (!) 116/59   Pulse 83   Ht 5' 8 (1.727 m)   Wt 160 lb 1.3 oz (72.6 kg)   SpO2 97%   BMI 24.34 kg/m    Physical Exam Vitals and nursing note reviewed.  Constitutional:      Appearance: Normal appearance.  HENT:     Head: Normocephalic and atraumatic.   Eyes:     Conjunctiva/sclera: Conjunctivae normal.    Cardiovascular:     Rate and Rhythm: Normal rate and regular rhythm.  Pulmonary:     Effort: Pulmonary effort is  normal.     Breath sounds: Wheezing present.     Comments: Slight exp wheeze at the base on the left. Cleared after several deep breaths.    Skin:    General: Skin is warm and dry.   Neurological:     Mental Status: He is alert.   Psychiatric:        Mood and Affect: Mood normal.      No results found for any visits on 07/11/23.    The ASCVD Risk score (Arnett DK, et al., 2019) failed to calculate for the following reasons:   The valid HDL cholesterol range is 20 to 100 mg/dL    Assessment & Plan:   Problem List Items Addressed This Visit       Cardiovascular and Mediastinum   Essential hypertension (Chronic)   Blood pressure looks great.  They took him off of his blood pressure medications and are mostly managing him with dialysis.        Respiratory   Centrilobular emphysema (HCC)   Courage him to get back into using the Trelegy once a day.        Genitourinary   End stage renal disease (HCC)   He recently started dialysis.  He is having surgery later this month for fistula      Other Visit Diagnoses       Pneumonia of left lower  lobe due to infectious organism    -  Primary   Relevant Orders   CMP14+EGFR   CBC with Differential     Nocturnal hypoglycemia           Nocturnal hypoglycemia-Ragona stop his insulin  completely he is really only getting a total of 5 units and he still having lows in the middle of the night even by splitting his insulin  up.  He also reports that they have been well-controlled it sounds like some of his insulin  issues were steroid-induced he was actually on 60 mg of prednisone  for 2 months then 40 mg of prednisone  for 1 month and now he is on 20 mg of prednisone  and doing much better in that regard.  Recent pneumonia-he is feeling significantly better he says the night and day difference.  He is still on 20 mg of prednisone .  Just a little wheeze in that left base that cleared after several deep breaths.  Just reminded him to use  his Trelegy daily he had been using it a little bit here and there since coming out of the hospital.  We also discussed using the albuterol  truly as a rescue if he notices increased cough sputum, gets around any specific triggers like smoke or get sick again.  Hopefully can continue to taper the oxygen off.  No follow-ups on file.    Dorothyann Byars, MD

## 2023-07-11 NOTE — Assessment & Plan Note (Signed)
 Blood pressure looks great.  They took him off of his blood pressure medications and are mostly managing him with dialysis.

## 2023-07-11 NOTE — Assessment & Plan Note (Signed)
 Courage him to get back into using the Trelegy once a day.

## 2023-07-11 NOTE — Assessment & Plan Note (Signed)
 He recently started dialysis.  He is having surgery later this month for fistula

## 2023-07-12 ENCOUNTER — Ambulatory Visit: Payer: Self-pay | Admitting: Family Medicine

## 2023-07-12 DIAGNOSIS — D509 Iron deficiency anemia, unspecified: Secondary | ICD-10-CM | POA: Diagnosis not present

## 2023-07-12 DIAGNOSIS — D689 Coagulation defect, unspecified: Secondary | ICD-10-CM | POA: Diagnosis not present

## 2023-07-12 DIAGNOSIS — I953 Hypotension of hemodialysis: Secondary | ICD-10-CM | POA: Diagnosis not present

## 2023-07-12 DIAGNOSIS — Z23 Encounter for immunization: Secondary | ICD-10-CM | POA: Diagnosis not present

## 2023-07-12 DIAGNOSIS — E211 Secondary hyperparathyroidism, not elsewhere classified: Secondary | ICD-10-CM | POA: Diagnosis not present

## 2023-07-12 DIAGNOSIS — D631 Anemia in chronic kidney disease: Secondary | ICD-10-CM | POA: Diagnosis not present

## 2023-07-12 DIAGNOSIS — Z992 Dependence on renal dialysis: Secondary | ICD-10-CM | POA: Diagnosis not present

## 2023-07-12 DIAGNOSIS — N186 End stage renal disease: Secondary | ICD-10-CM | POA: Diagnosis not present

## 2023-07-12 LAB — CBC WITH DIFFERENTIAL/PLATELET
Basophils Absolute: 0 10*3/uL (ref 0.0–0.2)
Basos: 0 %
EOS (ABSOLUTE): 0 10*3/uL (ref 0.0–0.4)
Eos: 0 %
Hematocrit: 25.5 % — ABNORMAL LOW (ref 37.5–51.0)
Hemoglobin: 8.1 g/dL — ABNORMAL LOW (ref 13.0–17.7)
Immature Grans (Abs): 0.6 10*3/uL — ABNORMAL HIGH (ref 0.0–0.1)
Immature Granulocytes: 3 %
Lymphocytes Absolute: 0.4 10*3/uL — ABNORMAL LOW (ref 0.7–3.1)
Lymphs: 2 %
MCH: 30.3 pg (ref 26.6–33.0)
MCHC: 31.8 g/dL (ref 31.5–35.7)
MCV: 96 fL (ref 79–97)
Monocytes Absolute: 0.5 10*3/uL (ref 0.1–0.9)
Monocytes: 2 %
Neutrophils Absolute: 18.3 10*3/uL — ABNORMAL HIGH (ref 1.4–7.0)
Neutrophils: 93 %
Platelets: 327 10*3/uL (ref 150–450)
RBC: 2.67 x10E6/uL — CL (ref 4.14–5.80)
RDW: 13.8 % (ref 11.6–15.4)
WBC: 19.8 10*3/uL — ABNORMAL HIGH (ref 3.4–10.8)

## 2023-07-12 LAB — CMP14+EGFR
ALT: 9 IU/L (ref 0–44)
AST: 11 IU/L (ref 0–40)
Albumin: 3.3 g/dL — ABNORMAL LOW (ref 3.8–4.8)
Alkaline Phosphatase: 170 IU/L — ABNORMAL HIGH (ref 44–121)
BUN/Creatinine Ratio: 18 (ref 10–24)
BUN: 77 mg/dL (ref 8–27)
Bilirubin Total: <0.2 mg/dL (ref 0.0–1.2)
CO2: 20 mmol/L (ref 20–29)
Calcium: 8.4 mg/dL — ABNORMAL LOW (ref 8.6–10.2)
Chloride: 94 mmol/L — ABNORMAL LOW (ref 96–106)
Creatinine, Ser: 4.29 mg/dL — ABNORMAL HIGH (ref 0.76–1.27)
Globulin, Total: 2.4 g/dL (ref 1.5–4.5)
Glucose: 166 mg/dL — ABNORMAL HIGH (ref 70–99)
Potassium: 4.7 mmol/L (ref 3.5–5.2)
Sodium: 132 mmol/L — ABNORMAL LOW (ref 134–144)
Total Protein: 5.7 g/dL — ABNORMAL LOW (ref 6.0–8.5)
eGFR: 14 mL/min/{1.73_m2} — ABNORMAL LOW (ref >59)

## 2023-07-12 NOTE — Progress Notes (Signed)
 Hi Bill, renal function still hanging around 4.  Sodium just a little borderline low not in a dangerous range but it looks like it is just a couple points down from baseline.  But no changes at this point in time.  Your alkaline phosphatase which is a liver enzyme has come down a little bit which is great.  Hemoglobin's hanging around 8 it was 9 a week ago.  Your white blood cells are pretty much staying about the same around 19,000.  It has not really trended down so we will keep an eye on it since you are definitely feeling a lot better.  I would recommend a repeat chest x-ray in about 2 weeks to make sure that the pneumonia has resolved if at any point you feel like things are changing or you do not feel as well please let us  know.

## 2023-07-17 DIAGNOSIS — D689 Coagulation defect, unspecified: Secondary | ICD-10-CM | POA: Diagnosis not present

## 2023-07-17 DIAGNOSIS — E211 Secondary hyperparathyroidism, not elsewhere classified: Secondary | ICD-10-CM | POA: Diagnosis not present

## 2023-07-17 DIAGNOSIS — Z23 Encounter for immunization: Secondary | ICD-10-CM | POA: Diagnosis not present

## 2023-07-17 DIAGNOSIS — D509 Iron deficiency anemia, unspecified: Secondary | ICD-10-CM | POA: Diagnosis not present

## 2023-07-17 DIAGNOSIS — Z992 Dependence on renal dialysis: Secondary | ICD-10-CM | POA: Diagnosis not present

## 2023-07-17 DIAGNOSIS — I953 Hypotension of hemodialysis: Secondary | ICD-10-CM | POA: Diagnosis not present

## 2023-07-17 DIAGNOSIS — D631 Anemia in chronic kidney disease: Secondary | ICD-10-CM | POA: Diagnosis not present

## 2023-07-17 DIAGNOSIS — N186 End stage renal disease: Secondary | ICD-10-CM | POA: Diagnosis not present

## 2023-07-19 DIAGNOSIS — D509 Iron deficiency anemia, unspecified: Secondary | ICD-10-CM | POA: Diagnosis not present

## 2023-07-19 DIAGNOSIS — N186 End stage renal disease: Secondary | ICD-10-CM | POA: Diagnosis not present

## 2023-07-19 DIAGNOSIS — D689 Coagulation defect, unspecified: Secondary | ICD-10-CM | POA: Diagnosis not present

## 2023-07-19 DIAGNOSIS — D631 Anemia in chronic kidney disease: Secondary | ICD-10-CM | POA: Diagnosis not present

## 2023-07-19 DIAGNOSIS — Z23 Encounter for immunization: Secondary | ICD-10-CM | POA: Diagnosis not present

## 2023-07-19 DIAGNOSIS — I953 Hypotension of hemodialysis: Secondary | ICD-10-CM | POA: Diagnosis not present

## 2023-07-19 DIAGNOSIS — E211 Secondary hyperparathyroidism, not elsewhere classified: Secondary | ICD-10-CM | POA: Diagnosis not present

## 2023-07-19 DIAGNOSIS — Z992 Dependence on renal dialysis: Secondary | ICD-10-CM | POA: Diagnosis not present

## 2023-07-20 ENCOUNTER — Encounter: Payer: Self-pay | Admitting: Physician Assistant

## 2023-07-20 ENCOUNTER — Encounter (HOSPITAL_COMMUNITY): Payer: Self-pay | Admitting: Vascular Surgery

## 2023-07-20 ENCOUNTER — Other Ambulatory Visit: Payer: Self-pay

## 2023-07-20 ENCOUNTER — Ambulatory Visit: Attending: Vascular Surgery | Admitting: Physician Assistant

## 2023-07-20 VITALS — BP 117/75 | HR 100 | Temp 98.0°F | Ht 68.0 in | Wt 161.9 lb

## 2023-07-20 DIAGNOSIS — N186 End stage renal disease: Secondary | ICD-10-CM | POA: Diagnosis not present

## 2023-07-20 NOTE — Progress Notes (Signed)
 Office Note     CC:  follow up Requesting Provider:  Antoniette Vermell CROME, PA-C  HPI: Walter Wilkerson is a 73 y.o. (22-Feb-1950) male who presents to discuss permanent dialysis access.  He was seen in the hospital and scheduled for AV graft placement.  This was however canceled due to an elevated white count due to chronic steroid use for pneumonia.  He is on the schedule with Dr. Lanis tomorrow 07/21/2023 for AV graft placement however he is still on 20 mg of prednisone  daily.  He is currently dialyzing via right IJ TDC without issue.  He denies fevers, chills, nausea/vomiting.   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 Norleen Gavel   VASECTOMY      Social History   Socioeconomic History   Marital status: Married    Spouse name: [name]   Number of children: 3   Years of education: 12   Highest education level: 12th grade  Occupational History   Occupation: [employer]    Comment: part time  Tobacco Use   Smoking status: Former    Current packs/day: 0.00    Average packs/day: 1 pack/day for 30.0 years (30.0 ttl pk-yrs)    Types: Cigarettes, Cigars    Start date: 08/11/1974     Quit date: 08/10/2004    Years since quitting: 18.9    Passive exposure: Past   Smokeless tobacco: Never  Vaping Use   Vaping status: Never Used  Substance and Sexual Activity   Alcohol use: Yes    Alcohol/week: 21.0 standard drinks of alcohol    Types: 21 Shots of liquor per week    Comment: 1-2 shots every night   Drug use: No   Sexual activity: Not Currently    Partners: Female  Other Topics Concern   Not on file  Social History Narrative   Lives with his wife and son. 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   Social Drivers of Corporate investment banker Strain: Low Risk  (04/19/2023)   Overall Financial Resource Strain (CARDIA)    Difficulty of Paying Living Expenses: Not hard at all  Food Insecurity: No Food Insecurity (07/01/2023)   Hunger Vital Sign    Worried About Running Out of Food in the Last Year: Never true    Ran Out of Food in the Last Year: Never true  Transportation Needs: No Transportation Needs (07/01/2023)   PRAPARE - Administrator, Civil Service (Medical): No    Lack of Transportation (Non-Medical): No  Physical Activity: Insufficiently Active (04/19/2023)   Exercise Vital Sign  Days of Exercise per Week: 4 days    Minutes of Exercise per Session: 20 min  Stress: No Stress Concern Present (04/19/2023)   [employer] of Occupational Health - Occupational Stress Questionnaire    Feeling of Stress : Not at all  Social Connections: Unknown (07/01/2023)   Social Connection and Isolation Panel    Frequency of Communication with Friends and Family: Three times a week    Frequency of Social Gatherings with Friends and Family: Three times a week    Attends Religious Services: Not on file    Active Member of Clubs or Organizations: Yes    Attends Club or Organization Meetings: More than 4 times per year    Marital Status: Married  Intimate Partner Violence: Not At Risk (07/01/2023)   Humiliation, Afraid, Rape, and Kick questionnaire     Fear of Current or Ex-Partner: No    Emotionally Abused: No    Physically Abused: No    Sexually Abused: No    Family History  Problem Relation Age of Onset   Heart attack Father    Hyperlipidemia Father    Hypertension Father    Heart disease Father    Diabetes Other        grandmother     Current Outpatient Medications  Medication Sig Dispense Refill   albuterol  (VENTOLIN  HFA) 108 (90 Base) MCG/ACT inhaler Inhale 2 puffs into the lungs every 4 (four) hours as needed. 6.7 g 0   aspirin  EC 81 MG tablet Take 81 mg by mouth daily.     atorvastatin  (LIPITOR) 40 MG tablet Take 1 tablet (40 mg total) by mouth daily. 90 tablet 3   clindamycin (CLEOCIN T) 1 % external solution Apply 1 Application topically 2 (two) times daily as needed (for scalp irritation).     clobetasol cream (TEMOVATE) 0.05 % Apply 1 Application topically 2 (two) times daily as needed (for scalp irritation).     Continuous Glucose Sensor (FREESTYLE LIBRE 3 SENSOR) MISC 1 each by Does not apply route every 14 (fourteen) days. Please apply for 14 days and then switch to new sensor 2 each 11   cyanocobalamin  (VITAMIN B12) 1000 MCG tablet Take 1,000 mcg by mouth daily.     DULoxetine  (CYMBALTA ) 30 MG capsule Take 1 capsule (30 mg total) by mouth daily. 90 capsule 1   Fluticasone -Umeclidin-Vilant (TRELEGY ELLIPTA ) 100-62.5-25 MCG/ACT AEPB Inhale 1 puff into the lungs daily in the afternoon. 60 each 5   furosemide  (LASIX ) 40 MG tablet Take 1 tablet (40 mg total) by mouth daily. 30 tablet 0   Glucagon  (GVOKE HYPOPEN  2-PACK) 1 MG/0.2ML SOAJ Inject 1mg  pen into outer thigh or lower abdomen if blood sugar less than 54. 0.4 mL 0   levothyroxine  (SYNTHROID ) 175 MCG tablet Take 1 tablet (175 mcg total) by mouth daily before breakfast. 90 tablet 1   Melatonin 10 MG TABS Take 10 mg by mouth at bedtime.     midodrine  (PROAMATINE ) 10 MG tablet Take 10 mg by mouth every Monday, Wednesday, and Friday. Before dialysis     pantoprazole   (PROTONIX ) 40 MG tablet Take 1 tablet (40 mg total) by mouth daily. 30 tablet 1   predniSONE  (DELTASONE ) 20 MG tablet Take 20 mg by mouth daily with breakfast.     Vitamin D , Ergocalciferol , (DRISDOL ) 1.25 MG (50000 UNIT) CAPS capsule Take 1 capsule (50,000 Units total) by mouth every 7 (seven) days. (Patient not taking: Reported on 07/17/2023) 5 capsule 0   No current facility-administered  medications for this visit.    Allergies  Allergen Reactions   Pregabalin Other (See Comments)    Elevated LFTs   Lisinopril Nausea Only   Merbromin Hives   Thimerosal (Thiomersal) Hives   Ferrous Sulfate  Nausea Only, Other (See Comments) and Nausea And Vomiting    325 mg once a day caused EXTREME NAUSEA     REVIEW OF SYSTEMS:   [X]  denotes positive finding, [ ]  denotes negative finding Cardiac  Comments:  Chest pain or chest pressure:    Shortness of breath upon exertion:    Short of breath when lying flat:    Irregular heart rhythm:        Vascular    Pain in calf, thigh, or hip brought on by ambulation:    Pain in feet at night that wakes you up from your sleep:     Blood clot in your veins:    Leg swelling:         Pulmonary    Oxygen at home:    Productive cough:     Wheezing:         Neurologic    Sudden weakness in arms or legs:     Sudden numbness in arms or legs:     Sudden onset of difficulty speaking or slurred speech:    Temporary loss of vision in one eye:     Problems with dizziness:         Gastrointestinal    Blood in stool:     Vomited blood:         Genitourinary    Burning when urinating:     Blood in urine:        Psychiatric    Major depression:         Hematologic    Bleeding problems:    Problems with blood clotting too easily:        Skin    Rashes or ulcers:        Constitutional    Fever or chills:      PHYSICAL EXAMINATION:  Vitals:   07/20/23 1434  BP: 117/75  Pulse: 100  Temp: 98 F (36.7 C)  TempSrc: Temporal  SpO2: 92%   Weight: 161 lb 14.4 oz (73.4 kg)  Height: 5' 8 (1.727 m)    General:  WDWN in NAD; vital signs documented above Gait: Not observed HENT: WNL, normocephalic Pulmonary: normal non-labored breathing Cardiac: regular HR Abdomen: soft, NT, no masses Skin: without rashes Vascular Exam/Pulses: palpable radial pulses Extremities: without ischemic changes, without Gangrene , without cellulitis; without open wounds;  Musculoskeletal: no muscle wasting or atrophy  Neurologic: A&O X 3 Psychiatric:  The pt has Normal affect.     ASSESSMENT/PLAN:: 73 y.o. male to discuss permanent access  Mr. Leaf is an end-stage renal disease patient on hemodialysis.  He is dialyzing without trouble from right IJ TDC.  He is on the schedule tomorrow with Dr. Lanis for left arm AV graft placement.  Unfortunately he is still on prednisone  20 mg daily.  We will cancel his surgery tomorrow due to the high risk of nonhealing/infection.  Dr. Lanis will discuss expected course of prednisone  with his nephrologist.  Once he has successfully weaned from steroids he will be rescheduled at that time.  Continue HD via right IJ TDC for now.   Walter Sender, PA-C [facility] [phone]  Clinic MD:   Lanis

## 2023-07-20 NOTE — Progress Notes (Signed)
 PCP - Antoniette Vermell CROME, PA-C  Cardiologist -   PPM/ICD - denies Device Orders - n/a Rep Notified - n/a  Chest x-ray - 07-03-23 EKG - 06-29-23 Stress Test - 09-08-21 ECHO - 04-28-23 Cardiac Cath -   CPAP - wears nightly 02 prn  Dm -denies  Blood Thinner Instructions: denies Aspirin  Instructions: continue  ERAS Protcol - NPO  COVID TEST- n/a  Anesthesia review: no  Patient verbally denies any shortness of breath, fever, cough and chest pain during phone call   -------------  SDW INSTRUCTIONS given:  Your procedure is scheduled on July 21, 2023.  Report to [facility] Main Entrance A at 11:55 A.M., and check in at the Admitting office.  Call this number if you have problems the morning of surgery:  [phone]   Remember:  Do not eat or drink after midnight the night before your surgery      Take these medicines the morning of surgery with A SIP OF WATER  aspirin   albuterol  (VENTOLIN  HFA)  inhaler  atorvastatin  (LIPITOR)  DULoxetine  (CYMBALTA )  levothyroxine  (SYNTHROID )  pantoprazole  (PROTONIX )  predniSONE  (DELTASONE )  As of today, STOP taking any Aspirin  (unless otherwise instructed by your surgeon) Aleve, Naproxen, Ibuprofen , Motrin , Advil , Goody's, BC's, all herbal medications, fish oil, and all vitamins.                   Do not wear jewelry, make up, or nail polish            Do not wear lotions, powders, perfumes/colognes, or deodorant.            Do not shave 48 hours prior to surgery.  Men may shave face and neck.            Do not bring valuables to the hospital.            [facility] is not responsible for any belongings or valuables.  Do NOT Smoke (Tobacco/Vaping) 24 hours prior to your procedure If you use a CPAP at night, you may bring all equipment for your overnight stay.   Contacts, glasses, dentures or bridgework may not be worn into surgery.      For patients admitted to the hospital, discharge time will be determined by your treatment  team.   Patients discharged the day of surgery will not be allowed to drive home, and someone needs to stay with them for 24 hours.    Special instructions:   [place]- Preparing For Surgery  Before surgery, you can play an important role. Because skin is not sterile, your skin needs to be as free of germs as possible. You can reduce the number of germs on your skin by washing with CHG (chlorahexidine gluconate) Soap before surgery.  CHG is an antiseptic cleaner which kills germs and bonds with the skin to continue killing germs even after washing.    Oral Hygiene is also important to reduce your risk of infection.  Remember - BRUSH YOUR TEETH THE MORNING OF SURGERY WITH YOUR REGULAR TOOTHPASTE  Please do not use if you have an allergy to CHG or antibacterial soaps. If your skin becomes reddened/irritated stop using the CHG.  Do not shave (including legs and underarms) for at least 48 hours prior to first CHG shower. It is OK to shave your face.  Please follow these instructions carefully.   Shower the NIGHT BEFORE SURGERY and the MORNING OF SURGERY with DIAL Soap.   Pat yourself dry with a  CLEAN TOWEL.  Wear CLEAN PAJAMAS to bed the night before surgery  Place CLEAN SHEETS on your bed the night of your first shower and DO NOT SLEEP WITH PETS.   Day of Surgery: Please shower morning of surgery  Wear Clean/Comfortable clothing the morning of surgery Do not apply any deodorants/lotions.   Remember to brush your teeth WITH YOUR REGULAR TOOTHPASTE.   Questions were answered. Patient verbalized understanding of instructions.

## 2023-07-21 ENCOUNTER — Ambulatory Visit (HOSPITAL_COMMUNITY): Admission: RE | Admit: 2023-07-21 | Source: Home / Self Care | Admitting: Vascular Surgery

## 2023-07-21 DIAGNOSIS — N186 End stage renal disease: Secondary | ICD-10-CM | POA: Diagnosis not present

## 2023-07-21 DIAGNOSIS — D509 Iron deficiency anemia, unspecified: Secondary | ICD-10-CM | POA: Diagnosis not present

## 2023-07-21 DIAGNOSIS — D631 Anemia in chronic kidney disease: Secondary | ICD-10-CM | POA: Diagnosis not present

## 2023-07-21 DIAGNOSIS — I953 Hypotension of hemodialysis: Secondary | ICD-10-CM | POA: Diagnosis not present

## 2023-07-21 DIAGNOSIS — E211 Secondary hyperparathyroidism, not elsewhere classified: Secondary | ICD-10-CM | POA: Diagnosis not present

## 2023-07-21 DIAGNOSIS — Z992 Dependence on renal dialysis: Secondary | ICD-10-CM | POA: Diagnosis not present

## 2023-07-21 DIAGNOSIS — Z23 Encounter for immunization: Secondary | ICD-10-CM | POA: Diagnosis not present

## 2023-07-21 DIAGNOSIS — D689 Coagulation defect, unspecified: Secondary | ICD-10-CM | POA: Diagnosis not present

## 2023-07-21 HISTORY — DX: Cardiac murmur, unspecified: R01.1

## 2023-07-21 HISTORY — DX: Polyneuropathy, unspecified: G62.9

## 2023-07-21 HISTORY — DX: Chronic obstructive pulmonary disease, unspecified: J44.9

## 2023-07-21 SURGERY — INSERTION, GRAFT, ARTERIOVENOUS, UPPER EXTREMITY
Anesthesia: Monitor Anesthesia Care | Laterality: Left

## 2023-07-24 DIAGNOSIS — Z23 Encounter for immunization: Secondary | ICD-10-CM | POA: Diagnosis not present

## 2023-07-24 DIAGNOSIS — I953 Hypotension of hemodialysis: Secondary | ICD-10-CM | POA: Diagnosis not present

## 2023-07-24 DIAGNOSIS — Z992 Dependence on renal dialysis: Secondary | ICD-10-CM | POA: Diagnosis not present

## 2023-07-24 DIAGNOSIS — D509 Iron deficiency anemia, unspecified: Secondary | ICD-10-CM | POA: Diagnosis not present

## 2023-07-24 DIAGNOSIS — N186 End stage renal disease: Secondary | ICD-10-CM | POA: Diagnosis not present

## 2023-07-24 DIAGNOSIS — E211 Secondary hyperparathyroidism, not elsewhere classified: Secondary | ICD-10-CM | POA: Diagnosis not present

## 2023-07-24 DIAGNOSIS — D689 Coagulation defect, unspecified: Secondary | ICD-10-CM | POA: Diagnosis not present

## 2023-07-24 DIAGNOSIS — D631 Anemia in chronic kidney disease: Secondary | ICD-10-CM | POA: Diagnosis not present

## 2023-07-26 DIAGNOSIS — D509 Iron deficiency anemia, unspecified: Secondary | ICD-10-CM | POA: Diagnosis not present

## 2023-07-26 DIAGNOSIS — Z992 Dependence on renal dialysis: Secondary | ICD-10-CM | POA: Diagnosis not present

## 2023-07-26 DIAGNOSIS — I953 Hypotension of hemodialysis: Secondary | ICD-10-CM | POA: Diagnosis not present

## 2023-07-26 DIAGNOSIS — D689 Coagulation defect, unspecified: Secondary | ICD-10-CM | POA: Diagnosis not present

## 2023-07-26 DIAGNOSIS — Z23 Encounter for immunization: Secondary | ICD-10-CM | POA: Diagnosis not present

## 2023-07-26 DIAGNOSIS — E211 Secondary hyperparathyroidism, not elsewhere classified: Secondary | ICD-10-CM | POA: Diagnosis not present

## 2023-07-26 DIAGNOSIS — D631 Anemia in chronic kidney disease: Secondary | ICD-10-CM | POA: Diagnosis not present

## 2023-07-26 DIAGNOSIS — N186 End stage renal disease: Secondary | ICD-10-CM | POA: Diagnosis not present

## 2023-07-28 DIAGNOSIS — N186 End stage renal disease: Secondary | ICD-10-CM | POA: Diagnosis not present

## 2023-07-28 DIAGNOSIS — E211 Secondary hyperparathyroidism, not elsewhere classified: Secondary | ICD-10-CM | POA: Diagnosis not present

## 2023-07-28 DIAGNOSIS — D689 Coagulation defect, unspecified: Secondary | ICD-10-CM | POA: Diagnosis not present

## 2023-07-28 DIAGNOSIS — Z23 Encounter for immunization: Secondary | ICD-10-CM | POA: Diagnosis not present

## 2023-07-28 DIAGNOSIS — I953 Hypotension of hemodialysis: Secondary | ICD-10-CM | POA: Diagnosis not present

## 2023-07-28 DIAGNOSIS — D631 Anemia in chronic kidney disease: Secondary | ICD-10-CM | POA: Diagnosis not present

## 2023-07-28 DIAGNOSIS — Z992 Dependence on renal dialysis: Secondary | ICD-10-CM | POA: Diagnosis not present

## 2023-07-28 DIAGNOSIS — D509 Iron deficiency anemia, unspecified: Secondary | ICD-10-CM | POA: Diagnosis not present

## 2023-07-31 ENCOUNTER — Telehealth: Payer: Self-pay

## 2023-07-31 DIAGNOSIS — D631 Anemia in chronic kidney disease: Secondary | ICD-10-CM | POA: Diagnosis not present

## 2023-07-31 DIAGNOSIS — Z992 Dependence on renal dialysis: Secondary | ICD-10-CM | POA: Diagnosis not present

## 2023-07-31 DIAGNOSIS — I953 Hypotension of hemodialysis: Secondary | ICD-10-CM | POA: Diagnosis not present

## 2023-07-31 DIAGNOSIS — N186 End stage renal disease: Secondary | ICD-10-CM | POA: Diagnosis not present

## 2023-07-31 DIAGNOSIS — D689 Coagulation defect, unspecified: Secondary | ICD-10-CM | POA: Diagnosis not present

## 2023-07-31 DIAGNOSIS — Z23 Encounter for immunization: Secondary | ICD-10-CM | POA: Diagnosis not present

## 2023-07-31 DIAGNOSIS — E211 Secondary hyperparathyroidism, not elsewhere classified: Secondary | ICD-10-CM | POA: Diagnosis not present

## 2023-07-31 DIAGNOSIS — D509 Iron deficiency anemia, unspecified: Secondary | ICD-10-CM | POA: Diagnosis not present

## 2023-07-31 NOTE — Telephone Encounter (Signed)
 Called pt to schedule his surgery. He was on his way to HD and didn't have his calendar with him. He stated he will call us  back later today to schedule.

## 2023-08-01 ENCOUNTER — Encounter: Payer: Self-pay | Admitting: Medical Oncology

## 2023-08-01 ENCOUNTER — Inpatient Hospital Stay: Attending: Hematology & Oncology

## 2023-08-01 ENCOUNTER — Inpatient Hospital Stay: Admitting: Medical Oncology

## 2023-08-01 ENCOUNTER — Inpatient Hospital Stay

## 2023-08-01 ENCOUNTER — Other Ambulatory Visit: Payer: Self-pay

## 2023-08-01 VITALS — BP 111/50 | HR 89 | Temp 98.3°F | Resp 18 | Ht 68.0 in | Wt 156.1 lb

## 2023-08-01 DIAGNOSIS — C3491 Malignant neoplasm of unspecified part of right bronchus or lung: Secondary | ICD-10-CM

## 2023-08-01 DIAGNOSIS — Z992 Dependence on renal dialysis: Secondary | ICD-10-CM | POA: Insufficient documentation

## 2023-08-01 DIAGNOSIS — D631 Anemia in chronic kidney disease: Secondary | ICD-10-CM

## 2023-08-01 DIAGNOSIS — Z87891 Personal history of nicotine dependence: Secondary | ICD-10-CM | POA: Insufficient documentation

## 2023-08-01 DIAGNOSIS — D509 Iron deficiency anemia, unspecified: Secondary | ICD-10-CM | POA: Diagnosis not present

## 2023-08-01 DIAGNOSIS — R7989 Other specified abnormal findings of blood chemistry: Secondary | ICD-10-CM

## 2023-08-01 DIAGNOSIS — R918 Other nonspecific abnormal finding of lung field: Secondary | ICD-10-CM

## 2023-08-01 DIAGNOSIS — D649 Anemia, unspecified: Secondary | ICD-10-CM | POA: Diagnosis not present

## 2023-08-01 LAB — CBC WITH DIFFERENTIAL (CANCER CENTER ONLY)
Abs Immature Granulocytes: 0.08 K/uL — ABNORMAL HIGH (ref 0.00–0.07)
Basophils Absolute: 0.2 K/uL — ABNORMAL HIGH (ref 0.0–0.1)
Basophils Relative: 1 %
Eosinophils Absolute: 0.3 K/uL (ref 0.0–0.5)
Eosinophils Relative: 2 %
HCT: 23.5 % — ABNORMAL LOW (ref 39.0–52.0)
Hemoglobin: 7.6 g/dL — ABNORMAL LOW (ref 13.0–17.0)
Immature Granulocytes: 1 %
Lymphocytes Relative: 13 %
Lymphs Abs: 1.6 K/uL (ref 0.7–4.0)
MCH: 29.1 pg (ref 26.0–34.0)
MCHC: 32.3 g/dL (ref 30.0–36.0)
MCV: 90 fL (ref 80.0–100.0)
Monocytes Absolute: 1.2 K/uL — ABNORMAL HIGH (ref 0.1–1.0)
Monocytes Relative: 9 %
Neutro Abs: 8.9 K/uL — ABNORMAL HIGH (ref 1.7–7.7)
Neutrophils Relative %: 74 %
Platelet Count: 295 K/uL (ref 150–400)
RBC: 2.61 MIL/uL — ABNORMAL LOW (ref 4.22–5.81)
RDW: 14.7 % (ref 11.5–15.5)
WBC Count: 12.2 K/uL — ABNORMAL HIGH (ref 4.0–10.5)
nRBC: 0 % (ref 0.0–0.2)

## 2023-08-01 LAB — CMP (CANCER CENTER ONLY)
ALT: 7 U/L (ref 0–44)
AST: 12 U/L — ABNORMAL LOW (ref 15–41)
Albumin: 3.6 g/dL (ref 3.5–5.0)
Alkaline Phosphatase: 81 U/L (ref 38–126)
Anion gap: 16 — ABNORMAL HIGH (ref 5–15)
BUN: 29 mg/dL — ABNORMAL HIGH (ref 8–23)
CO2: 25 mmol/L (ref 22–32)
Calcium: 9.6 mg/dL (ref 8.9–10.3)
Chloride: 94 mmol/L — ABNORMAL LOW (ref 98–111)
Creatinine: 4.63 mg/dL — ABNORMAL HIGH (ref 0.61–1.24)
GFR, Estimated: 13 mL/min — ABNORMAL LOW (ref >60)
Glucose, Bld: 122 mg/dL — ABNORMAL HIGH (ref 70–99)
Potassium: 3.9 mmol/L (ref 3.5–5.1)
Sodium: 135 mmol/L (ref 135–145)
Total Bilirubin: 0.3 mg/dL (ref 0.0–1.2)
Total Protein: 6.8 g/dL (ref 6.5–8.1)

## 2023-08-01 LAB — PREPARE RBC (CROSSMATCH)

## 2023-08-01 LAB — SAMPLE TO BLOOD BANK

## 2023-08-01 NOTE — Progress Notes (Signed)
 Hematology and Oncology Follow Up Visit  Mitch Arquette [id number] [birth year] 73 y.o. 08/01/2023   Principle Diagnosis:  Multiple pulmonary nodules -history of tobacco use Anemia of iron  deficiency  Anemia of erythropoietin  deficiency  Current Therapy:    IV Iron  -Venover 300 mg given 12/28/2022  Aranesp  300 mcg subcu for hemoglobin less than 11- managed by nephrology     Interim History:  Mr. Speckman was seen for follow up.    Unfortunately he has had a difficult time since his last visit. He has had pneumonia as well as renal failure. Elevated creatinine started around 6 months ago. He had a renal biopsy which showed Crescentic Glomerulonephritis, Pauci-immune Type.   He currently goes to Dr. Dennise is treating him at [facility]). He gets dialysis M,W,F. He reports that he is getting his Aranesp  at this center.   He has responded very nicely to iron  and Aranesp  in the past but recently his Hgb has trended down. He is here for consideration of blood products or iron .   There has been no bleeding to his knowledge: denies epistaxis, gingivitis, hemoptysis, hematemesis, hematuria, melena, excessive bruising, blood donation.   He has been hydrating like normal. Urinating like normal. No N/V/D.   There is been no problems with leg swelling.  He has had no rashes.  Rare night sweats. He has lost about 30 pounds since we last saw each other. Still eating well.   Currently, I would say his performance status is probably ECOG 2.  Wt Readings from Last 3 Encounters:  08/01/23 156 lb 1.9 oz (70.8 kg)  07/20/23 161 lb 14.4 oz (73.4 kg)  07/11/23 160 lb 1.3 oz (72.6 kg)    Medications:  Current Outpatient Medications:    aspirin  EC 81 MG tablet, Take 81 mg by mouth daily., Disp: , Rfl:    atorvastatin  (LIPITOR) 40 MG tablet, Take 1 tablet (40 mg total) by mouth daily., Disp: 90 tablet, Rfl: 3   clindamycin (CLEOCIN T) 1 % external solution, Apply 1  Application topically 2 (two) times daily as needed (for scalp irritation)., Disp: , Rfl:    clobetasol cream (TEMOVATE) 0.05 %, Apply 1 Application topically 2 (two) times daily as needed (for scalp irritation)., Disp: , Rfl:    Continuous Glucose Sensor (FREESTYLE LIBRE 3 SENSOR) MISC, 1 each by Does not apply route every 14 (fourteen) days. Please apply for 14 days and then switch to new sensor, Disp: 2 each, Rfl: 11   cyanocobalamin  (VITAMIN B12) 1000 MCG tablet, Take 1,000 mcg by mouth daily., Disp: , Rfl:    DULoxetine  (CYMBALTA ) 30 MG capsule, Take 1 capsule (30 mg total) by mouth daily., Disp: 90 capsule, Rfl: 1   Fluticasone -Umeclidin-Vilant (TRELEGY ELLIPTA ) 100-62.5-25 MCG/ACT AEPB, Inhale 1 puff into the lungs daily in the afternoon., Disp: 60 each, Rfl: 5   furosemide  (LASIX ) 40 MG tablet, Take 1 tablet (40 mg total) by mouth daily., Disp: 30 tablet, Rfl: 0   levothyroxine  (SYNTHROID ) 175 MCG tablet, Take 1 tablet (175 mcg total) by mouth daily before breakfast., Disp: 90 tablet, Rfl: 1   Melatonin 10 MG TABS, Take 10 mg by mouth at bedtime., Disp: , Rfl:    midodrine  (PROAMATINE ) 10 MG tablet, Take 10 mg by mouth every Monday, Wednesday, and Friday. Before dialysis, Disp: , Rfl:    pantoprazole  (PROTONIX ) 40 MG tablet, Take 1 tablet (40 mg total) by mouth daily., Disp: 30 tablet, Rfl: 1   Vitamin D , Ergocalciferol , (DRISDOL )  1.25 MG (50000 UNIT) CAPS capsule, Take 1 capsule (50,000 Units total) by mouth every 7 (seven) days., Disp: 5 capsule, Rfl: 0  Allergies:  Allergies  Allergen Reactions   Pregabalin Other (See Comments)    Elevated LFTs   Ferrous Sulfate  Nausea And Vomiting, Nausea Only and Other (See Comments)    325 mg once a day caused EXTREME NAUSEA   Lisinopril Nausea Only   Merbromin Hives   Thimerosal (Thiomersal) Hives    Past Medical History, Surgical history, Social history, and Family History were reviewed and updated.  Review of Systems: Review of Systems   Constitutional: Negative.   HENT:  Negative.    Eyes: Negative.   Respiratory: Negative.    Cardiovascular: Negative.   Gastrointestinal: Negative.   Endocrine: Negative.   Genitourinary: Negative.    Musculoskeletal:  Positive for back pain (chronic unchanged).  Skin: Negative.   Neurological: Negative.   Hematological: Negative.   Psychiatric/Behavioral: Negative.      Physical Exam:  height is 5' 8 (1.727 m) and weight is 156 lb 1.9 oz (70.8 kg). His oral temperature is 98.3 F (36.8 C). His blood pressure is 111/50 (abnormal) and his pulse is 89. His respiration is 18 and oxygen saturation is 100%.   Wt Readings from Last 3 Encounters:  08/01/23 156 lb 1.9 oz (70.8 kg)  07/20/23 161 lb 14.4 oz (73.4 kg)  07/11/23 160 lb 1.3 oz (72.6 kg)    Physical Exam Vitals reviewed.  Constitutional:      General: He is not in acute distress. HENT:     Head: Normocephalic and atraumatic.  Eyes:     Pupils: Pupils are equal, round, and reactive to light.  Cardiovascular:     Rate and Rhythm: Normal rate and regular rhythm.     Heart sounds: Normal heart sounds.     Comments: Cardiac exam is regular rate and rhythm.  He has no murmurs, rubs or bruits. Pulmonary:     Effort: Pulmonary effort is normal. No respiratory distress.     Breath sounds: Normal breath sounds. No stridor. No wheezing, rhonchi or rales.     Comments: Some LL rhonchi Abdominal:     General: Bowel sounds are normal.     Palpations: Abdomen is soft.  Musculoskeletal:        General: No tenderness or deformity. Normal range of motion.     Cervical back: Normal range of motion.  Lymphadenopathy:     Cervical: No cervical adenopathy.  Skin:    General: Skin is warm and dry.     Findings: No erythema or rash.  Neurological:     Mental Status: He is alert and oriented to person, place, and time.  Psychiatric:        Behavior: Behavior normal.        Thought Content: Thought content normal.         Judgment: Judgment normal.     Lab Results  Component Value Date   WBC 12.2 (H) 08/01/2023   HGB 7.6 (L) 08/01/2023   HCT 23.5 (L) 08/01/2023   MCV 90.0 08/01/2023   PLT 295 08/01/2023     Chemistry      Component Value Date/Time   NA 132 (L) 07/11/2023 0000   K 4.7 07/11/2023 0000   CL 94 (L) 07/11/2023 0000   CO2 20 07/11/2023 0000   BUN 77 (HH) 07/11/2023 0000   CREATININE 4.29 (H) 07/11/2023 0000   CREATININE 5.10 (H) 04/27/2023 1017  CREATININE 1.44 (H) 01/11/2022 0945   GLU 101 04/01/2013 0000      Component Value Date/Time   CALCIUM  8.4 (L) 07/11/2023 0000   ALKPHOS 170 (H) 07/11/2023 0000   AST 11 07/11/2023 0000   AST 12 (L) 04/27/2023 1017   ALT 9 07/11/2023 0000   ALT 7 04/27/2023 1017   BILITOT <0.2 07/11/2023 0000   BILITOT 0.6 04/27/2023 1017     Encounter Diagnoses  Name Primary?   Multiple lung nodules on CT Yes   Non-small cell cancer of right lung (HCC)    Abnormal CT scan of lung     Impression and Plan: Mr. Haigler is a very nice 73 year old white male with symptomatic anemia. Multifactorial anemia. He has Crescentic Glomerulonephritis, Pauci-Immune Type. He is now on dialysis who, per patient, is managing his Aranesp .   We will try to confirm that he is getting Aranesp  as his Hgb has lowered.  He will get a type/screen today and return Thursday for 1 unit of blood He had a normocytic BMB in 06/29/2023 He will follow up in 2 weeks for APP, labs (Day 1), 1 unit of blood (Day 2)  In terms of his history of lung cancer and pulmonary changes/findings off of his CTA performed on 06/29/2023 I have recommended referral to pulmonology which I have placed for patient.     RTC Thursday for 1 unit of blood RTC 2 weeks for APP, labs(CBC, iron , ferritin, retic, B12, folate, sample) (Day 1), 1 unit of blood (Day 2)  Lauraine CHRISTELLA Dais, PA-C 7/22/20252:39 PM

## 2023-08-02 ENCOUNTER — Other Ambulatory Visit: Payer: Self-pay

## 2023-08-02 ENCOUNTER — Ambulatory Visit: Payer: Self-pay | Admitting: Medical Oncology

## 2023-08-02 DIAGNOSIS — N186 End stage renal disease: Secondary | ICD-10-CM | POA: Diagnosis not present

## 2023-08-02 DIAGNOSIS — Z23 Encounter for immunization: Secondary | ICD-10-CM | POA: Diagnosis not present

## 2023-08-02 DIAGNOSIS — I953 Hypotension of hemodialysis: Secondary | ICD-10-CM | POA: Diagnosis not present

## 2023-08-02 DIAGNOSIS — D509 Iron deficiency anemia, unspecified: Secondary | ICD-10-CM | POA: Diagnosis not present

## 2023-08-02 DIAGNOSIS — E211 Secondary hyperparathyroidism, not elsewhere classified: Secondary | ICD-10-CM | POA: Diagnosis not present

## 2023-08-02 DIAGNOSIS — Z992 Dependence on renal dialysis: Secondary | ICD-10-CM | POA: Diagnosis not present

## 2023-08-02 DIAGNOSIS — D631 Anemia in chronic kidney disease: Secondary | ICD-10-CM | POA: Diagnosis not present

## 2023-08-02 DIAGNOSIS — D689 Coagulation defect, unspecified: Secondary | ICD-10-CM | POA: Diagnosis not present

## 2023-08-03 ENCOUNTER — Inpatient Hospital Stay

## 2023-08-03 ENCOUNTER — Other Ambulatory Visit (HOSPITAL_COMMUNITY)

## 2023-08-03 ENCOUNTER — Encounter: Admitting: Vascular Surgery

## 2023-08-03 ENCOUNTER — Encounter (HOSPITAL_COMMUNITY)

## 2023-08-03 DIAGNOSIS — Z992 Dependence on renal dialysis: Secondary | ICD-10-CM | POA: Diagnosis not present

## 2023-08-03 DIAGNOSIS — D631 Anemia in chronic kidney disease: Secondary | ICD-10-CM

## 2023-08-03 DIAGNOSIS — Z87891 Personal history of nicotine dependence: Secondary | ICD-10-CM | POA: Diagnosis not present

## 2023-08-03 DIAGNOSIS — D509 Iron deficiency anemia, unspecified: Secondary | ICD-10-CM | POA: Diagnosis not present

## 2023-08-03 MED ORDER — ACETAMINOPHEN 325 MG PO TABS: 650.0000 mg | ORAL_TABLET | Freq: Once | ORAL | Status: DC

## 2023-08-03 MED ORDER — DIPHENHYDRAMINE HCL 25 MG PO CAPS: 25.0000 mg | ORAL_CAPSULE | Freq: Once | ORAL | Status: DC

## 2023-08-03 MED ORDER — SODIUM CHLORIDE 0.9% IV SOLUTION
250.0000 mL | INTRAVENOUS | Status: DC
  Administered 2023-08-03: 250 mL via INTRAVENOUS

## 2023-08-03 NOTE — Patient Instructions (Signed)

## 2023-08-04 ENCOUNTER — Ambulatory Visit: Admitting: Physician Assistant

## 2023-08-04 DIAGNOSIS — E211 Secondary hyperparathyroidism, not elsewhere classified: Secondary | ICD-10-CM | POA: Diagnosis not present

## 2023-08-04 DIAGNOSIS — D689 Coagulation defect, unspecified: Secondary | ICD-10-CM | POA: Diagnosis not present

## 2023-08-04 DIAGNOSIS — Z23 Encounter for immunization: Secondary | ICD-10-CM | POA: Diagnosis not present

## 2023-08-04 DIAGNOSIS — D631 Anemia in chronic kidney disease: Secondary | ICD-10-CM | POA: Diagnosis not present

## 2023-08-04 DIAGNOSIS — Z992 Dependence on renal dialysis: Secondary | ICD-10-CM | POA: Diagnosis not present

## 2023-08-04 DIAGNOSIS — D509 Iron deficiency anemia, unspecified: Secondary | ICD-10-CM | POA: Diagnosis not present

## 2023-08-04 DIAGNOSIS — N186 End stage renal disease: Secondary | ICD-10-CM | POA: Diagnosis not present

## 2023-08-04 DIAGNOSIS — I953 Hypotension of hemodialysis: Secondary | ICD-10-CM | POA: Diagnosis not present

## 2023-08-04 LAB — TYPE AND SCREEN
ABO/RH(D): A POS
Antibody Screen: NEGATIVE
Unit division: 0

## 2023-08-04 LAB — BPAM RBC
Blood Product Expiration Date: 202508102359
ISSUE DATE / TIME: 202507240727
Unit Type and Rh: 202508102359
Unit Type and Rh: 6200

## 2023-08-07 DIAGNOSIS — D509 Iron deficiency anemia, unspecified: Secondary | ICD-10-CM | POA: Diagnosis not present

## 2023-08-07 DIAGNOSIS — D689 Coagulation defect, unspecified: Secondary | ICD-10-CM | POA: Diagnosis not present

## 2023-08-07 DIAGNOSIS — N186 End stage renal disease: Secondary | ICD-10-CM | POA: Diagnosis not present

## 2023-08-07 DIAGNOSIS — Z23 Encounter for immunization: Secondary | ICD-10-CM | POA: Diagnosis not present

## 2023-08-07 DIAGNOSIS — D631 Anemia in chronic kidney disease: Secondary | ICD-10-CM | POA: Diagnosis not present

## 2023-08-07 DIAGNOSIS — I953 Hypotension of hemodialysis: Secondary | ICD-10-CM | POA: Diagnosis not present

## 2023-08-07 DIAGNOSIS — Z992 Dependence on renal dialysis: Secondary | ICD-10-CM | POA: Diagnosis not present

## 2023-08-07 DIAGNOSIS — E211 Secondary hyperparathyroidism, not elsewhere classified: Secondary | ICD-10-CM | POA: Diagnosis not present

## 2023-08-09 DIAGNOSIS — N186 End stage renal disease: Secondary | ICD-10-CM | POA: Diagnosis not present

## 2023-08-09 DIAGNOSIS — Z23 Encounter for immunization: Secondary | ICD-10-CM | POA: Diagnosis not present

## 2023-08-09 DIAGNOSIS — D509 Iron deficiency anemia, unspecified: Secondary | ICD-10-CM | POA: Diagnosis not present

## 2023-08-09 DIAGNOSIS — Z992 Dependence on renal dialysis: Secondary | ICD-10-CM | POA: Diagnosis not present

## 2023-08-09 DIAGNOSIS — I953 Hypotension of hemodialysis: Secondary | ICD-10-CM | POA: Diagnosis not present

## 2023-08-09 DIAGNOSIS — D689 Coagulation defect, unspecified: Secondary | ICD-10-CM | POA: Diagnosis not present

## 2023-08-09 DIAGNOSIS — D631 Anemia in chronic kidney disease: Secondary | ICD-10-CM | POA: Diagnosis not present

## 2023-08-09 DIAGNOSIS — E211 Secondary hyperparathyroidism, not elsewhere classified: Secondary | ICD-10-CM | POA: Diagnosis not present

## 2023-08-10 DIAGNOSIS — Z992 Dependence on renal dialysis: Secondary | ICD-10-CM | POA: Diagnosis not present

## 2023-08-10 DIAGNOSIS — N186 End stage renal disease: Secondary | ICD-10-CM | POA: Diagnosis not present

## 2023-08-10 DIAGNOSIS — N037 Chronic nephritic syndrome with diffuse crescentic glomerulonephritis: Secondary | ICD-10-CM | POA: Diagnosis not present

## 2023-08-11 ENCOUNTER — Encounter (HOSPITAL_COMMUNITY): Payer: Self-pay | Admitting: Vascular Surgery

## 2023-08-11 ENCOUNTER — Other Ambulatory Visit: Payer: Self-pay

## 2023-08-11 DIAGNOSIS — D631 Anemia in chronic kidney disease: Secondary | ICD-10-CM | POA: Diagnosis not present

## 2023-08-11 DIAGNOSIS — N186 End stage renal disease: Secondary | ICD-10-CM | POA: Diagnosis not present

## 2023-08-11 DIAGNOSIS — E211 Secondary hyperparathyroidism, not elsewhere classified: Secondary | ICD-10-CM | POA: Diagnosis not present

## 2023-08-11 DIAGNOSIS — Z992 Dependence on renal dialysis: Secondary | ICD-10-CM | POA: Diagnosis not present

## 2023-08-11 DIAGNOSIS — Z23 Encounter for immunization: Secondary | ICD-10-CM | POA: Diagnosis not present

## 2023-08-11 DIAGNOSIS — D509 Iron deficiency anemia, unspecified: Secondary | ICD-10-CM | POA: Diagnosis not present

## 2023-08-11 DIAGNOSIS — D689 Coagulation defect, unspecified: Secondary | ICD-10-CM | POA: Diagnosis not present

## 2023-08-11 NOTE — Progress Notes (Signed)
 SDW call  Patient was given pre-op  instructions over the phone. Patient verbalized understanding of instructions provided.     PCP - Vermell Bologna, PA-C Oncologist: [facility] Cardiologist - denies one at this time Pulmonary:    PPM/ICD - denies Device Orders - na Rep Notified - na   Chest x-ray - 07/06/2023 EKG -  06/30/2023 Stress Test - 09/08/2021 ECHO - 04/28/2023 Cardiac Cath -  PFT: 05/05/2022  Sleep Study/sleep apnea/CPAP: OSA with CPAP  Non-diabetic   Blood Thinner Instructions: denies Aspirin  Instructions: instructed to continue   ERAS Protcol - NPO  Anesthesia review: Yes. OSA with CPAP, HTN, heart murmur, ESRD with dialysis M, W, F, COPD   Patient denies shortness of breath, fever, cough and chest pain over the phone call  Your procedure is scheduled on Monday August 14, 2023  Report to [facility] Main Entrance A at  0820  A.M., then check in with the Admitting office.  Call this number if you have problems the morning of surgery:  [phone]   If you have any questions prior to your surgery date call [phone]: Open Monday-Friday 8am-4pm If you experience any cold or flu symptoms such as cough, fever, chills, shortness of breath, etc. between now and your scheduled surgery, please notify us  at the above number    Remember:  Do not eat or drink after midnight the night before your surgery  Take these medicines the morning of surgery with A SIP OF WATER:  Atorvastatin , cymbalta , synthroid , midodrine , protonix , ASA  As of today, STOP taking any Aleve, Naproxen, Ibuprofen , Motrin , Advil , Goody's, BC's, all herbal medications, fish oil, and all vitamins.

## 2023-08-12 DIAGNOSIS — N186 End stage renal disease: Secondary | ICD-10-CM | POA: Diagnosis not present

## 2023-08-12 DIAGNOSIS — D689 Coagulation defect, unspecified: Secondary | ICD-10-CM | POA: Diagnosis not present

## 2023-08-12 DIAGNOSIS — Z23 Encounter for immunization: Secondary | ICD-10-CM | POA: Diagnosis not present

## 2023-08-12 DIAGNOSIS — E211 Secondary hyperparathyroidism, not elsewhere classified: Secondary | ICD-10-CM | POA: Diagnosis not present

## 2023-08-12 DIAGNOSIS — Z992 Dependence on renal dialysis: Secondary | ICD-10-CM | POA: Diagnosis not present

## 2023-08-12 DIAGNOSIS — D509 Iron deficiency anemia, unspecified: Secondary | ICD-10-CM | POA: Diagnosis not present

## 2023-08-12 DIAGNOSIS — D631 Anemia in chronic kidney disease: Secondary | ICD-10-CM | POA: Diagnosis not present

## 2023-08-14 ENCOUNTER — Other Ambulatory Visit: Payer: Self-pay

## 2023-08-14 ENCOUNTER — Other Ambulatory Visit (HOSPITAL_COMMUNITY): Payer: Self-pay

## 2023-08-14 ENCOUNTER — Encounter (HOSPITAL_COMMUNITY)
Admission: RE | Disposition: A | Discharge status: Home / Self Care | Payer: Self-pay | Source: Home / Self Care | Attending: Vascular Surgery

## 2023-08-14 ENCOUNTER — Ambulatory Visit (HOSPITAL_COMMUNITY)
Admission: RE | Admit: 2023-08-14 | Discharge: 2023-08-14 | Disposition: A | Discharge status: Home / Self Care | Attending: Vascular Surgery | Admitting: Vascular Surgery

## 2023-08-14 ENCOUNTER — Ambulatory Visit (HOSPITAL_COMMUNITY): Payer: Self-pay | Admitting: Physician Assistant

## 2023-08-14 ENCOUNTER — Encounter (HOSPITAL_COMMUNITY): Payer: Self-pay | Admitting: Vascular Surgery

## 2023-08-14 DIAGNOSIS — I120 Hypertensive chronic kidney disease with stage 5 chronic kidney disease or end stage renal disease: Secondary | ICD-10-CM | POA: Diagnosis not present

## 2023-08-14 DIAGNOSIS — Z87891 Personal history of nicotine dependence: Secondary | ICD-10-CM | POA: Insufficient documentation

## 2023-08-14 DIAGNOSIS — Z85118 Personal history of other malignant neoplasm of bronchus and lung: Secondary | ICD-10-CM | POA: Insufficient documentation

## 2023-08-14 DIAGNOSIS — N186 End stage renal disease: Secondary | ICD-10-CM | POA: Insufficient documentation

## 2023-08-14 DIAGNOSIS — G4730 Sleep apnea, unspecified: Secondary | ICD-10-CM | POA: Diagnosis not present

## 2023-08-14 DIAGNOSIS — J449 Chronic obstructive pulmonary disease, unspecified: Secondary | ICD-10-CM

## 2023-08-14 DIAGNOSIS — E039 Hypothyroidism, unspecified: Secondary | ICD-10-CM | POA: Insufficient documentation

## 2023-08-14 DIAGNOSIS — D631 Anemia in chronic kidney disease: Secondary | ICD-10-CM | POA: Insufficient documentation

## 2023-08-14 DIAGNOSIS — Z992 Dependence on renal dialysis: Secondary | ICD-10-CM | POA: Diagnosis not present

## 2023-08-14 HISTORY — PX: AV FISTULA PLACEMENT: SHX1204

## 2023-08-14 LAB — POCT I-STAT, CHEM 8
BUN: 36 mg/dL — ABNORMAL HIGH (ref 8–23)
Calcium, Ion: 1.11 mmol/L — ABNORMAL LOW (ref 1.15–1.40)
Chloride: 94 mmol/L — ABNORMAL LOW (ref 98–111)
Creatinine, Ser: 4.9 mg/dL — ABNORMAL HIGH (ref 0.61–1.24)
Glucose, Bld: 82 mg/dL (ref 70–99)
HCT: 26 % — ABNORMAL LOW (ref 39.0–52.0)
Hemoglobin: 8.8 g/dL — ABNORMAL LOW (ref 13.0–17.0)
Potassium: 4.1 mmol/L (ref 3.5–5.1)
Sodium: 129 mmol/L — ABNORMAL LOW (ref 135–145)
TCO2: 25 mmol/L (ref 22–32)

## 2023-08-14 SURGERY — ARTERIOVENOUS (AV) FISTULA CREATION
Anesthesia: General | Site: Arm Upper | Laterality: Left

## 2023-08-14 MED ORDER — HEPARIN 6000 UNIT IRRIGATION SOLUTION
Status: AC
  Filled 2023-08-14: qty 500

## 2023-08-14 MED ORDER — OXYCODONE HCL 5 MG/5ML PO SOLN: 5.0000 mg | Freq: Once | ORAL | Status: AC | PRN

## 2023-08-14 MED ORDER — 0.9 % SODIUM CHLORIDE (POUR BTL) OPTIME
TOPICAL | Status: DC | PRN
  Administered 2023-08-14: 1000 mL

## 2023-08-14 MED ORDER — HEPARIN 6000 UNIT IRRIGATION SOLUTION
Status: DC | PRN
  Administered 2023-08-14: 1

## 2023-08-14 MED ORDER — VASOPRESSIN 20 UNIT/ML IV SOLN
INTRAVENOUS | Status: DC | PRN
  Administered 2023-08-14: 1 [IU] via INTRAVENOUS

## 2023-08-14 MED ORDER — SODIUM CHLORIDE 0.9 % IV SOLN: INTRAVENOUS | Status: DC

## 2023-08-14 MED ORDER — ALBUMIN HUMAN 5 % IV SOLN: INTRAVENOUS | Status: DC | PRN

## 2023-08-14 MED ORDER — FENTANYL CITRATE (PF) 100 MCG/2ML IJ SOLN
25.0000 ug | INTRAMUSCULAR | Status: DC | PRN
  Administered 2023-08-14 (×2): 50 ug via INTRAVENOUS

## 2023-08-14 MED ORDER — LIDOCAINE 2% (20 MG/ML) 5 ML SYRINGE
INTRAMUSCULAR | Status: DC | PRN
  Administered 2023-08-14: 100 mg via INTRAVENOUS

## 2023-08-14 MED ORDER — ONDANSETRON HCL 4 MG/2ML IJ SOLN
INTRAMUSCULAR | Status: DC | PRN
  Administered 2023-08-14: 4 mg via INTRAVENOUS

## 2023-08-14 MED ORDER — GLYCOPYRROLATE 0.2 MG/ML IJ SOLN
INTRAMUSCULAR | Status: DC | PRN
  Administered 2023-08-14: .2 mg via INTRAVENOUS

## 2023-08-14 MED ORDER — HYDROCODONE-ACETAMINOPHEN 5-325 MG PO TABS
1.0000 | ORAL_TABLET | Freq: Four times a day (QID) | ORAL | 0 refills | Status: DC | PRN
  Filled 2023-08-14: qty 8, 2d supply, fill #0

## 2023-08-14 MED ORDER — FENTANYL CITRATE (PF) 100 MCG/2ML IJ SOLN
INTRAMUSCULAR | Status: AC
  Filled 2023-08-14: qty 2

## 2023-08-14 MED ORDER — FENTANYL CITRATE (PF) 250 MCG/5ML IJ SOLN
INTRAMUSCULAR | Status: AC
  Filled 2023-08-14: qty 5

## 2023-08-14 MED ORDER — MIDAZOLAM HCL 2 MG/2ML IJ SOLN
INTRAMUSCULAR | Status: AC
  Filled 2023-08-14: qty 2

## 2023-08-14 MED ORDER — PROPOFOL 10 MG/ML IV BOLUS
INTRAVENOUS | Status: AC
  Filled 2023-08-14: qty 20

## 2023-08-14 MED ORDER — PHENYLEPHRINE 80 MCG/ML (10ML) SYRINGE FOR IV PUSH (FOR BLOOD PRESSURE SUPPORT)
PREFILLED_SYRINGE | INTRAVENOUS | Status: DC | PRN
  Administered 2023-08-14 (×2): 160 ug via INTRAVENOUS
  Administered 2023-08-14: 80 ug via INTRAVENOUS

## 2023-08-14 MED ORDER — ACETAMINOPHEN 10 MG/ML IV SOLN
1000.0000 mg | Freq: Once | INTRAVENOUS | Status: DC | PRN
  Administered 2023-08-14: 1000 mg via INTRAVENOUS

## 2023-08-14 MED ORDER — PROPOFOL 10 MG/ML IV BOLUS
INTRAVENOUS | Status: DC | PRN
  Administered 2023-08-14: 50 mg via INTRAVENOUS
  Administered 2023-08-14: 150 mg via INTRAVENOUS
  Administered 2023-08-14: 30 mg via INTRAVENOUS

## 2023-08-14 MED ORDER — LIDOCAINE HCL (PF) 1 % IJ SOLN
INTRAMUSCULAR | Status: AC
  Filled 2023-08-14: qty 30

## 2023-08-14 MED ORDER — ACETAMINOPHEN 10 MG/ML IV SOLN
INTRAVENOUS | Status: AC
  Filled 2023-08-14: qty 100

## 2023-08-14 MED ORDER — OXYCODONE HCL 5 MG PO TABS
5.0000 mg | ORAL_TABLET | Freq: Once | ORAL | Status: AC | PRN
  Administered 2023-08-14: 5 mg via ORAL

## 2023-08-14 MED ORDER — EPHEDRINE SULFATE-NACL 50-0.9 MG/10ML-% IV SOSY
PREFILLED_SYRINGE | INTRAVENOUS | Status: DC | PRN
  Administered 2023-08-14: 7.5 mg via INTRAVENOUS
  Administered 2023-08-14 (×2): 5 mg via INTRAVENOUS
  Administered 2023-08-14: 7.5 mg via INTRAVENOUS
  Administered 2023-08-14: 5 mg via INTRAVENOUS

## 2023-08-14 MED ORDER — CHLORHEXIDINE GLUCONATE 4 % EX SOLN: 60.0000 mL | Freq: Once | CUTANEOUS | Status: DC

## 2023-08-14 MED ORDER — MIDAZOLAM HCL 2 MG/2ML IJ SOLN
INTRAMUSCULAR | Status: AC
Start: 2023-08-14 — End: 2023-08-14
  Filled 2023-08-14: qty 2

## 2023-08-14 MED ORDER — CHLORHEXIDINE GLUCONATE 0.12 % MT SOLN
15.0000 mL | OROMUCOSAL | Status: AC
  Administered 2023-08-14: 15 mL via OROMUCOSAL
  Filled 2023-08-14 (×2): qty 15

## 2023-08-14 MED ORDER — ONDANSETRON HCL 4 MG/2ML IJ SOLN
INTRAMUSCULAR | Status: AC
  Filled 2023-08-14: qty 2

## 2023-08-14 MED ORDER — FENTANYL CITRATE (PF) 250 MCG/5ML IJ SOLN
INTRAMUSCULAR | Status: DC | PRN
  Administered 2023-08-14: 25 ug via INTRAVENOUS
  Administered 2023-08-14: 50 ug via INTRAVENOUS

## 2023-08-14 MED ORDER — PHENYLEPHRINE HCL-NACL 20-0.9 MG/250ML-% IV SOLN
INTRAVENOUS | Status: DC | PRN
  Administered 2023-08-14: 40 ug/min via INTRAVENOUS

## 2023-08-14 MED ORDER — CEFAZOLIN SODIUM-DEXTROSE 2-4 GM/100ML-% IV SOLN
2.0000 g | INTRAVENOUS | Status: AC
  Administered 2023-08-14: 2 g via INTRAVENOUS
  Filled 2023-08-14: qty 100

## 2023-08-14 MED ORDER — ONDANSETRON HCL 4 MG/2ML IJ SOLN: 4.0000 mg | Freq: Once | INTRAMUSCULAR | Status: DC | PRN

## 2023-08-14 MED ORDER — OXYCODONE HCL 5 MG PO TABS
ORAL_TABLET | ORAL | Status: AC
  Filled 2023-08-14: qty 1

## 2023-08-14 SURGICAL SUPPLY — 35 items
ARMBAND PINK RESTRICT EXTREMIT (MISCELLANEOUS) ×2 IMPLANT
BAG COUNTER SPONGE SURGICOUNT (BAG) ×2 IMPLANT
BNDG ELASTIC 4INX 5YD STR LF (GAUZE/BANDAGES/DRESSINGS) ×1 IMPLANT
CANISTER SUCTION 3000ML PPV (SUCTIONS) ×2 IMPLANT
CLIP TI MEDIUM 6 (CLIP) ×2 IMPLANT
CLIP TI WIDE RED SMALL 6 (CLIP) ×2 IMPLANT
DERMABOND ADVANCED .7 DNX12 (GAUZE/BANDAGES/DRESSINGS) ×2 IMPLANT
ELECTRODE REM PT RTRN 9FT ADLT (ELECTROSURGICAL) ×2 IMPLANT
GAUZE SPONGE 4X4 12PLY STRL (GAUZE/BANDAGES/DRESSINGS) ×2 IMPLANT
GLOVE BIOGEL PI IND STRL 7.0 (GLOVE) ×2 IMPLANT
GOWN STRL REUS W/ TWL LRG LVL3 (GOWN DISPOSABLE) ×4 IMPLANT
GOWN STRL REUS W/ TWL XL LVL3 (GOWN DISPOSABLE) ×2 IMPLANT
INTRODUCER COOK 11FR (CATHETERS) IMPLANT
INTRODUCER SET COOK 14FR (MISCELLANEOUS) IMPLANT
KIT BASIN OR (CUSTOM PROCEDURE TRAY) ×2 IMPLANT
KIT ENCORE 26 ADVANTAGE (KITS) ×2 IMPLANT
KIT TURNOVER KIT B (KITS) ×2 IMPLANT
LOOP VESSEL MINI RED (MISCELLANEOUS) ×1 IMPLANT
NDL HYPO 25GX1X1/2 BEV (NEEDLE) ×1 IMPLANT
NEEDLE HYPO 25GX1X1/2 BEV (NEEDLE) ×2 IMPLANT
NS IRRIG 1000ML POUR BTL (IV SOLUTION) ×2 IMPLANT
PACK CV ACCESS (CUSTOM PROCEDURE TRAY) ×2 IMPLANT
PAD ARMBOARD POSITIONER FOAM (MISCELLANEOUS) ×4 IMPLANT
POWDER SURGICEL 3.0 GRAM (HEMOSTASIS) IMPLANT
SET INTRODUCER 12FR PACEMAKER (INTRODUCER) IMPLANT
SET MICROPUNCTURE 5F STIFF (MISCELLANEOUS) ×2 IMPLANT
SHEATH BRITE TIP 7FRX11 (SHEATH) IMPLANT
SUT MNCRL AB 4-0 PS2 18 (SUTURE) ×2 IMPLANT
SUT PROLENE 6 0 BV (SUTURE) ×2 IMPLANT
SUT VIC AB 3-0 SH 27X BRD (SUTURE) ×2 IMPLANT
SYR 20ML LL LF (SYRINGE) ×2 IMPLANT
TOWEL GREEN STERILE (TOWEL DISPOSABLE) ×2 IMPLANT
UNDERPAD 30X36 HEAVY ABSORB (UNDERPADS AND DIAPERS) ×2 IMPLANT
WATER STERILE IRR 1000ML POUR (IV SOLUTION) ×2 IMPLANT
WIRE BENTSON .035X145CM (WIRE) ×2 IMPLANT

## 2023-08-14 NOTE — Discharge Instructions (Signed)
   [facility]  Discharge Instructions  AV Fistula or Graft Surgery for Dialysis Access  Please refer to the following instructions for your post-procedure care. Your surgeon or physician assistant will discuss any changes with you.  Activity  You may drive the day following your surgery, if you are comfortable and no longer taking prescription pain medication. Resume full activity as the soreness in your incision resolves.  Bathing/Showering  You may shower after you go home. Keep your incision dry for 48 hours. Do not soak in a bathtub, hot tub, or swim until the incision heals completely. You may not shower if you have a hemodialysis catheter.  Incision Care  Clean your incision with mild soap and water after 48 hours. Pat the area dry with a clean towel. You do not need a bandage unless otherwise instructed. Do not apply any ointments or creams to your incision. You may have skin glue on your incision. Do not peel it off. It will come off on its own in about one week. Your arm may swell a bit after surgery. To reduce swelling use pillows to elevate your arm so it is above your heart. Your doctor will tell you if you need to lightly wrap your arm with an ACE bandage.  Diet  Resume your normal diet. There are not special food restrictions following this procedure. In order to heal from your surgery, it is CRITICAL to get adequate nutrition. Your body requires vitamins, minerals, and protein. Vegetables are the best source of vitamins and minerals. Vegetables also provide the perfect balance of protein. Processed food has little nutritional value, so try to avoid this.  Medications  Resume taking all of your medications. If your incision is causing pain, you may take over-the counter pain relievers such as acetaminophen  (Tylenol ). If you were prescribed a stronger pain medication, please be aware these medications can cause nausea and constipation. Prevent  nausea by taking the medication with a snack or meal. Avoid constipation by drinking plenty of fluids and eating foods with high amount of fiber, such as fruits, vegetables, and grains.  Do not take Tylenol  if you are taking prescription pain medications.  Follow up Your surgeon may want to see you in the office following your access surgery. If so, this will be arranged at the time of your surgery.  Please call us  immediately for any of the following conditions:  Increased pain, redness, drainage (pus) from your incision site Fever of 101 degrees or higher Severe or worsening pain at your incision site Hand pain or numbness.  Reduce your risk of vascular disease:  Stop smoking. If you would like help, call [facility] at 1-800-QUIT-NOW ((819)597-9591) or [place] at [phone]  Manage your cholesterol Maintain a desired weight Control your diabetes Keep your blood pressure down  Dialysis  It will take several weeks to several months for your new dialysis access to be ready for use. Your surgeon will determine when it is okay to use it. Your nephrologist will continue to direct your dialysis. You can continue to use your Permcath until your new access is ready for use.   08/14/2023 Walter Wilkerson [id number] 1950/08/05  Surgeon(s): Lanis Fonda BRAVO, MD  Procedure(s): Creation left 1st stage basilic vein transposition   x Do not stick fistula for 12 weeks    If you have any questions, please call the office at [phone].

## 2023-08-14 NOTE — Progress Notes (Signed)
 Informed patient and patient's wife of RUE IV infiltrating while in the OR. RUE swollen and firm. Instructed patient to keep a close eye on the right arm and should it worsen to please return to the ED or follow up with his physician. Instructed patient to elevate his arm when he gets home and use a warm compresses to help with the swelling.

## 2023-08-14 NOTE — Op Note (Addendum)
    NAME: Walter Wilkerson    MRN: [id number] DOB: 1950-04-08    DATE OF OPERATION: 08/14/2023  PREOP DIAGNOSIS:    End stage renal disease  POSTOP DIAGNOSIS:    Same  PROCEDURE:    Left arm brachiobasilic fistula creation  SURGEON: Fonda FORBES Rim  ASSIST: Lucie Apt PA  ANESTHESIA: General   EBL: 20ml  INDICATIONS:    Walter Wilkerson is a 73 y.o. male with end stage renal disease in need of long term HD access.   FINDINGS:    Left arm basilic vein 3.5 mm Left arm brachial artery 4.56mm  TECHNIQUE:    The patient was brought to the operating room and placed in supine position. The left arm was prepped and draped in a standard fashion. IV antibiotics were prior to incision. A timeout was performed.   The basilic vein in the left arm was identified using ultrasound and appeared of sufficient size. A transverse incision was made above the elbow creese in the antecubital fossa. The basilic vein was identified and isolated for 4 cm in length.  The bicipital aponeurosis was partially released and the brachial artery freed from its paired brachial veins and secured with a vessel loop. The patient was heparinized. The basilic vein was marked and ligated distally with 2-0 silk, then flushed with heparinized saline. Vascular clamps were placed proximally and distally on the brachial artery and a 5 mm arteriotomy  was created on the brachial artery. This was flushed with heparin  saline. The vein was juxtaposed to the artery and an end to side anastomosis was created using 6-0 Prolene.   Prior to completing the anastomsis, the vessels were flushed and the suture line was tied down. There was an excellent thrill in the basilic vein from the anastomosis into the upper arm. The patient had a 2+ radial pulse. The incision was irrigated and hemostasis acheived. The deeper tissue was closed with 3-0 Vicryl and the skin closed with 4-0 Monocryl.    Dermabond was applied the incisions. He was  transferred to PACU in stable condition.    Given the complexity of the case,  the assistant was necessary in order to expedient the procedure and safely perform the technical aspects of the operation.  The assistant provided traction and countertraction to assist with exposure of the artery and vein.  They also assisted with suture ligation of multiple venous branches.  They played a critical role in the anastomosis. These skills, especially following the Prolene suture for the anastomosis, could not have been adequately performed by a scrub tech assistant.     Fonda FORBES Rim, MD [facility] DATE OF DICTATION:   08/14/2023

## 2023-08-14 NOTE — Anesthesia Preprocedure Evaluation (Addendum)
 Anesthesia Evaluation  Patient identified by MRN, date of birth, ID band Patient awake    Reviewed: Allergy & Precautions, NPO status , Patient's Chart, lab work & pertinent test results, reviewed documented beta blocker date and time   History of Anesthesia Complications Negative for: history of anesthetic complications  Airway Mallampati: II  TM Distance: >3 FB     Dental no notable dental hx.    Pulmonary shortness of breath, sleep apnea , pneumonia, COPD,  COPD inhaler, former smoker   breath sounds clear to auscultation       Cardiovascular hypertension, + Valvular Problems/Murmurs  Rhythm:Regular Rate:Normal  IMPRESSIONS     1. Left ventricular ejection fraction, by estimation, is 65 to 70%. The  left ventricle has normal function. The left ventricle has no regional  wall motion abnormalities. There is mild left ventricular hypertrophy.  Left ventricular diastolic parameters  were normal.   2. Right ventricular systolic function is normal. The right ventricular  size is normal.   3. The mitral valve is normal in structure. No evidence of mitral valve  regurgitation.   4. The aortic valve is tricuspid. Aortic valve regurgitation is not  visualized.     Neuro/Psych neg Seizures    GI/Hepatic ,neg GERD  ,,(+) Hepatitis -  Endo/Other  Hypothyroidism    Renal/GU ESRF and DialysisRenal disease     Musculoskeletal   Abdominal   Peds  Hematology  (+) Blood dyscrasia, anemia   Anesthesia Other Findings   Reproductive/Obstetrics                              Anesthesia Physical Anesthesia Plan  ASA: 3  Anesthesia Plan: General   Post-op Pain Management:    Induction: Intravenous  PONV Risk Score and Plan: 2 and Ondansetron   Airway Management Planned: LMA  Additional Equipment:   Intra-op Plan:   Post-operative Plan: Extubation in OR  Informed Consent: I have reviewed  the patients History and Physical, chart, labs and discussed the procedure including the risks, benefits and alternatives for the proposed anesthesia with the patient or authorized representative who has indicated his/her understanding and acceptance.     Dental advisory given  Plan Discussed with: CRNA  Anesthesia Plan Comments:         Anesthesia Quick Evaluation

## 2023-08-14 NOTE — H&P (Signed)
 Office Note    Patient seen and examined in preop holding.  No complaints. No changes to medication history or physical exam since last seen in clinic. He is now off of prednisone . After discussing the risks and benefits of left arm fistula versus AV graft, Walter Wilkerson elected to proceed.   Fonda FORBES Rim MD   CC:  follow up Requesting Provider:  No ref. provider found  HPI: Walter Wilkerson is a 73 y.o. (1950/06/16) male who presents to discuss permanent dialysis access.  He was seen in the hospital and scheduled for AV graft placement.  This was however canceled due to an elevated white count due to chronic steroid use for pneumonia.  He is on the schedule with Dr. Rim tomorrow 07/21/2023 for AV graft placement however he is still on 20 mg of prednisone  daily.  He is currently dialyzing via right IJ TDC without issue.  He denies fevers, chills, nausea/vomiting.   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 Norleen Gavel   VASECTOMY      Social History   Socioeconomic History   Marital status: Married    Spouse name: [name]    Number of children: 3   Years of education: 12   Highest education level: 12th grade  Occupational History   Occupation: [employer]    Comment: part time  Tobacco Use   Smoking status: Former    Current packs/day: 0.00    Average packs/day: 1 pack/day for 30.0 years (30.0 ttl pk-yrs)    Types: Cigarettes, Cigars    Start date: 08/11/1974    Quit date: 08/10/2004    Years since quitting: 19.0    Passive exposure: Past   Smokeless tobacco: Never  Vaping Use   Vaping status: Never Used  Substance and Sexual Activity   Alcohol use: Not Currently    Alcohol/week: 21.0 standard drinks of alcohol    Types: 21 Shots of liquor per week   Drug use: Yes    Types: Marijuana    Comment: THC   Sexual activity: Not Currently    Partners: Female  Other Topics Concern   Not on file  Social History Narrative   Lives with his wife and son. 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   Social Drivers of Corporate investment banker Strain: Low Risk  (04/19/2023)   Overall Financial Resource Strain (CARDIA)    Difficulty of Paying Living Expenses: Not hard at all  Food Insecurity: No Food Insecurity (07/01/2023)   Hunger Vital Sign  Worried About Programme researcher, broadcasting/film/video in the Last Year: Never true    Ran Out of Food in the Last Year: Never true  Transportation Needs: No Transportation Needs (07/01/2023)   PRAPARE - Administrator, Civil Service (Medical): No    Lack of Transportation (Non-Medical): No  Physical Activity: Insufficiently Active (04/19/2023)   Exercise Vital Sign    Days of Exercise per Week: 4 days    Minutes of Exercise per Session: 20 min  Stress: No Stress Concern Present (04/19/2023)   [employer] of Occupational Health - Occupational Stress Questionnaire    Feeling of Stress : Not at all  Social Connections: Unknown (07/01/2023)   Social Connection and Isolation Panel    Frequency of Communication with Friends and Family: Three times a week    Frequency  of Social Gatherings with Friends and Family: Three times a week    Attends Religious Services: Not on file    Active Member of Clubs or Organizations: Yes    Attends Club or Organization Meetings: More than 4 times per year    Marital Status: Married  Intimate Partner Violence: Not At Risk (07/01/2023)   Humiliation, Afraid, Rape, and Kick questionnaire    Fear of Current or Ex-Partner: No    Emotionally Abused: No    Physically Abused: No    Sexually Abused: No    Family History  Problem Relation Age of Onset   Heart attack Father    Hyperlipidemia Father    Hypertension Father    Heart disease Father    Diabetes Other        grandmother     Current Facility-Administered Medications  Medication Dose Route Frequency Provider Last Rate Last Admin   0.9 %  sodium chloride  infusion   Intravenous Continuous Lanis Fonda BRAVO, MD 10 mL/hr at 08/14/23 0906 New Bag at 08/14/23 0906   ceFAZolin  (ANCEF ) IVPB 2g/100 mL premix  2 g Intravenous 30 min Pre-Op  Lanis Fonda BRAVO, MD       chlorhexidine  (HIBICLENS ) 4 % liquid 4 Application  60 mL Topical Once Lanis Fonda BRAVO, MD        Allergies  Allergen Reactions   Pregabalin Other (See Comments)    Elevated LFTs   Ferrous Sulfate  Nausea And Vomiting, Nausea Only and Other (See Comments)    325 mg once a day caused EXTREME NAUSEA   Lisinopril Nausea Only   Merbromin Hives   Thimerosal (Thiomersal) Hives     REVIEW OF SYSTEMS:   [X]  denotes positive finding, [ ]  denotes negative finding Cardiac  Comments:  Chest pain or chest pressure:    Shortness of breath upon exertion:    Short of breath when lying flat:    Irregular heart rhythm:        Vascular    Pain in calf, thigh, or hip brought on by ambulation:    Pain in feet at night that wakes you up from your sleep:     Blood clot in your veins:    Leg swelling:         Pulmonary    Oxygen at home:    Productive cough:     Wheezing:         Neurologic    Sudden weakness  in arms or legs:     Sudden numbness in arms or legs:     Sudden onset of difficulty speaking or slurred speech:    Temporary loss of vision in one  eye:     Problems with dizziness:         Gastrointestinal    Blood in stool:     Vomited blood:         Genitourinary    Burning when urinating:     Blood in urine:        Psychiatric    Major depression:         Hematologic    Bleeding problems:    Problems with blood clotting too easily:        Skin    Rashes or ulcers:        Constitutional    Fever or chills:      PHYSICAL EXAMINATION:  Vitals:   08/11/23 0835 08/14/23 0829  BP:  (!) 174/84  Pulse:  85  Resp:  18  Temp:  98.3 F (36.8 C)  TempSrc:  Oral  SpO2:  93%  Weight: 72.1 kg 71.7 kg  Height: 5' 8 (1.727 m) 5' 8 (1.727 m)    General:  WDWN in NAD; vital signs documented above Gait: Not observed HENT: WNL, normocephalic Pulmonary: normal non-labored breathing Cardiac: regular HR Abdomen: soft, NT, no masses Skin: without rashes Vascular Exam/Pulses: palpable radial pulses Extremities: without ischemic changes, without Gangrene , without cellulitis; without open wounds;  Musculoskeletal: no muscle wasting or atrophy  Neurologic: A&O X 3 Psychiatric:  The pt has Normal affect.     ASSESSMENT/PLAN:: 73 y.o. male to discuss permanent access  Mr. Cupp is an end-stage renal disease patient on hemodialysis.  He is dialyzing without trouble from right IJ TDC.  He is on the schedule tomorrow with Dr. Lanis for left arm AV graft placement.  Unfortunately he is still on prednisone  20 mg daily.  We will cancel his surgery tomorrow due to the high risk of nonhealing/infection.  Dr. Lanis will discuss expected course of prednisone  with his nephrologist.  Once he has successfully weaned from steroids he will be rescheduled at that time.  Continue HD via right IJ TDC for now.   Fonda FORBES Lanis, PA-C [facility] [phone]  Clinic MD:    Lanis

## 2023-08-14 NOTE — Transfer of Care (Signed)
 Immediate Anesthesia Transfer of Care Note  Patient: Walter Wilkerson  Procedure(s) Performed: ARTERIOVENOUS (AV) FISTULA CREATION (Left: Arm Upper)  Patient Location: PACU  Anesthesia Type:General  Level of Consciousness: awake, alert , and patient cooperative  Airway & Oxygen Therapy: Patient Spontanous Breathing and Patient connected to face mask oxygen  Post-op Assessment: Report given to RN, Post -op Vital signs reviewed and stable, Patient moving all extremities, and Patient moving all extremities X 4  Post vital signs: Reviewed and stable  Last Vitals:  Vitals Value Taken Time  BP 132/54 08/14/23 12:16  Temp 36.4 C 08/14/23 12:16  Pulse 69 08/14/23 12:22  Resp 15 08/14/23 12:22  SpO2 95 % 08/14/23 12:22  Vitals shown include unfiled device data.  Last Pain:  Vitals:   08/14/23 0843  TempSrc:   PainSc: 0-No pain      Patients Stated Pain Goal: 0 (08/14/23 0843)  Complications: No notable events documented.

## 2023-08-15 ENCOUNTER — Ambulatory Visit: Admitting: Physician Assistant

## 2023-08-15 ENCOUNTER — Encounter (HOSPITAL_COMMUNITY): Payer: Self-pay | Admitting: Vascular Surgery

## 2023-08-15 DIAGNOSIS — D689 Coagulation defect, unspecified: Secondary | ICD-10-CM | POA: Diagnosis not present

## 2023-08-15 DIAGNOSIS — Z992 Dependence on renal dialysis: Secondary | ICD-10-CM | POA: Diagnosis not present

## 2023-08-15 DIAGNOSIS — D509 Iron deficiency anemia, unspecified: Secondary | ICD-10-CM | POA: Diagnosis not present

## 2023-08-15 DIAGNOSIS — E211 Secondary hyperparathyroidism, not elsewhere classified: Secondary | ICD-10-CM | POA: Diagnosis not present

## 2023-08-15 DIAGNOSIS — N186 End stage renal disease: Secondary | ICD-10-CM | POA: Diagnosis not present

## 2023-08-15 DIAGNOSIS — Z23 Encounter for immunization: Secondary | ICD-10-CM | POA: Diagnosis not present

## 2023-08-15 DIAGNOSIS — D631 Anemia in chronic kidney disease: Secondary | ICD-10-CM | POA: Diagnosis not present

## 2023-08-15 NOTE — Anesthesia Postprocedure Evaluation (Signed)
 Anesthesia Post Note  Patient: Walter Wilkerson  Procedure(s) Performed: ARTERIOVENOUS (AV) FISTULA CREATION (Left: Arm Upper)     Patient location during evaluation: PACU Anesthesia Type: General Level of consciousness: awake and alert Pain management: pain level controlled Vital Signs Assessment: post-procedure vital signs reviewed and stable Respiratory status: spontaneous breathing, nonlabored ventilation, respiratory function stable and patient connected to nasal cannula oxygen Cardiovascular status: blood pressure returned to baseline and stable Postop Assessment: no apparent nausea or vomiting Anesthetic complications: no   No notable events documented.  Last Vitals:  Vitals:   08/14/23 1245 08/14/23 1300  BP: (!) 148/66 (!) 149/92  Pulse: 65 64  Resp: 12 14  Temp:  (!) 36.4 C  SpO2: 94% 99%    Last Pain:  Vitals:   08/14/23 1300  TempSrc:   PainSc: 4                  [name] MARLA Cornea

## 2023-08-16 ENCOUNTER — Ambulatory Visit: Admitting: Medical Oncology

## 2023-08-16 ENCOUNTER — Telehealth: Payer: Self-pay

## 2023-08-16 ENCOUNTER — Other Ambulatory Visit

## 2023-08-16 DIAGNOSIS — D509 Iron deficiency anemia, unspecified: Secondary | ICD-10-CM | POA: Diagnosis not present

## 2023-08-16 DIAGNOSIS — Z23 Encounter for immunization: Secondary | ICD-10-CM | POA: Diagnosis not present

## 2023-08-16 DIAGNOSIS — E211 Secondary hyperparathyroidism, not elsewhere classified: Secondary | ICD-10-CM | POA: Diagnosis not present

## 2023-08-16 DIAGNOSIS — N186 End stage renal disease: Secondary | ICD-10-CM | POA: Diagnosis not present

## 2023-08-16 DIAGNOSIS — D631 Anemia in chronic kidney disease: Secondary | ICD-10-CM | POA: Diagnosis not present

## 2023-08-16 DIAGNOSIS — Z992 Dependence on renal dialysis: Secondary | ICD-10-CM | POA: Diagnosis not present

## 2023-08-16 DIAGNOSIS — D689 Coagulation defect, unspecified: Secondary | ICD-10-CM | POA: Diagnosis not present

## 2023-08-16 NOTE — Telephone Encounter (Signed)
 Pt called to report scant bleeding from proximal end of AVF incision.  Patient applied mild pressure and bandaid.  Pt reported skin glue still intact. Pt knows to keep incision clean and dry. Pt knows to go to ED if incision dehisces or begins to bleed profusely.

## 2023-08-17 ENCOUNTER — Ambulatory Visit: Payer: Self-pay | Admitting: Medical Oncology

## 2023-08-17 ENCOUNTER — Inpatient Hospital Stay: Admitting: Medical Oncology

## 2023-08-17 ENCOUNTER — Encounter: Payer: Self-pay | Admitting: Medical Oncology

## 2023-08-17 ENCOUNTER — Inpatient Hospital Stay: Attending: Hematology & Oncology

## 2023-08-17 ENCOUNTER — Inpatient Hospital Stay

## 2023-08-17 ENCOUNTER — Encounter

## 2023-08-17 VITALS — BP 134/64 | HR 79 | Temp 98.7°F | Resp 18 | Ht 68.0 in | Wt 155.1 lb

## 2023-08-17 DIAGNOSIS — Z992 Dependence on renal dialysis: Secondary | ICD-10-CM | POA: Insufficient documentation

## 2023-08-17 DIAGNOSIS — D696 Thrombocytopenia, unspecified: Secondary | ICD-10-CM | POA: Diagnosis not present

## 2023-08-17 DIAGNOSIS — N183 Chronic kidney disease, stage 3 unspecified: Secondary | ICD-10-CM | POA: Diagnosis not present

## 2023-08-17 DIAGNOSIS — N057 Unspecified nephritic syndrome with diffuse crescentic glomerulonephritis: Secondary | ICD-10-CM | POA: Diagnosis not present

## 2023-08-17 DIAGNOSIS — R7989 Other specified abnormal findings of blood chemistry: Secondary | ICD-10-CM | POA: Diagnosis not present

## 2023-08-17 DIAGNOSIS — Z85110 Personal history of malignant carcinoid tumor of bronchus and lung: Secondary | ICD-10-CM | POA: Diagnosis not present

## 2023-08-17 DIAGNOSIS — D509 Iron deficiency anemia, unspecified: Secondary | ICD-10-CM | POA: Insufficient documentation

## 2023-08-17 DIAGNOSIS — N189 Chronic kidney disease, unspecified: Secondary | ICD-10-CM | POA: Diagnosis not present

## 2023-08-17 DIAGNOSIS — D638 Anemia in other chronic diseases classified elsewhere: Secondary | ICD-10-CM | POA: Diagnosis not present

## 2023-08-17 DIAGNOSIS — D500 Iron deficiency anemia secondary to blood loss (chronic): Secondary | ICD-10-CM

## 2023-08-17 DIAGNOSIS — D631 Anemia in chronic kidney disease: Secondary | ICD-10-CM

## 2023-08-17 DIAGNOSIS — D649 Anemia, unspecified: Secondary | ICD-10-CM

## 2023-08-17 LAB — CBC
HCT: 26.9 % — ABNORMAL LOW (ref 39.0–52.0)
Hemoglobin: 8.6 g/dL — ABNORMAL LOW (ref 13.0–17.0)
MCH: 28.8 pg (ref 26.0–34.0)
MCHC: 32 g/dL (ref 30.0–36.0)
MCV: 90 fL (ref 80.0–100.0)
Platelets: 314 K/uL (ref 150–400)
RBC: 2.99 MIL/uL — ABNORMAL LOW (ref 4.22–5.81)
RDW: 15.1 % (ref 11.5–15.5)
WBC: 10.5 K/uL (ref 4.0–10.5)
nRBC: 0 % (ref 0.0–0.2)

## 2023-08-17 LAB — RETIC PANEL
Immature Retic Fract: 7.1 % (ref 2.3–15.9)
RBC.: 2.94 MIL/uL — ABNORMAL LOW (ref 4.22–5.81)
Retic Count, Absolute: 42.3 K/uL (ref 19.0–186.0)
Retic Ct Pct: 1.4 % (ref 0.4–3.1)
Reticulocyte Hemoglobin: 31.3 pg (ref >27.9)

## 2023-08-17 LAB — VITAMIN B12: Vitamin B-12: 1398 pg/mL — ABNORMAL HIGH (ref 180–914)

## 2023-08-17 LAB — IRON AND IRON BINDING CAPACITY (CC-WL,HP ONLY)
Iron: 56 ug/dL (ref 45–182)
Saturation Ratios: 21 % (ref 17.9–39.5)
TIBC: 272 ug/dL (ref 250–450)
UIBC: 216 ug/dL

## 2023-08-17 LAB — FERRITIN: Ferritin: 604 ng/mL — ABNORMAL HIGH (ref 24–336)

## 2023-08-17 LAB — FOLATE: Folate: 39.2 ng/mL (ref >5.9)

## 2023-08-17 LAB — SAMPLE TO BLOOD BANK

## 2023-08-17 NOTE — Progress Notes (Signed)
 Hematology and Oncology Follow Up Visit  Walter Wilkerson [id number] 1950-03-28 72 y.o. 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 Fannie at [facility]. Anemia of iron  deficiency  Crescentic Glomerulonephritis, Pauci-immune Type.  Anemia of erythropoietin  deficiency  Current Therapy:    IV Iron  -Venover 300 mg given 12/28/2022  Aranesp  300 mcg subcu for hemoglobin less than 11- managed by nephrology     Interim History:  [name] is seen today for follow up and consideration of blood products or IV iron .    He has Crescentic Glomerulonephritis, Pauci-immune Type.   He currently goes to Dr. Dennise is treating him at [facility]). He gets dialysis M,W,F. He reports that he is getting his Aranesp  at this center. He reports having had one dose within the last 1-2 months. He sees them tomorrow and will check on the frequency.   His last blood transfusion was on 08/03/2023  There has been no bleeding to his knowledge: denies epistaxis, gingivitis, hemoptysis, hematemesis, hematuria, melena, excessive bruising, blood donation.   He has been hydrating like normal. Urinating like normal. No N/V/D.   There is been no problems with leg swelling.  He has had no rashes.  Eating and drinking well.   Currently, I would say his performance status is probably ECOG 2.  Wt Readings from Last 3 Encounters:  08/17/23 155 lb 1.9 oz (70.4 kg)  08/14/23 158 lb (71.7 kg)  08/01/23 156 lb 1.9 oz (70.8 kg)    Medications:  Current Outpatient Medications:    aspirin  EC 81 MG tablet, Take 81 mg by mouth daily., Disp: , Rfl:    atorvastatin  (LIPITOR) 40 MG tablet, Take 1 tablet (40 mg total) by mouth daily., Disp: 90 tablet, Rfl: 3   clindamycin (CLEOCIN T) 1 % external solution, Apply 1 Application topically 2 (two) times daily as needed  (for scalp irritation)., Disp: , Rfl:    clobetasol cream (TEMOVATE) 0.05 %, Apply 1 Application topically 2 (two) times daily as needed (for scalp irritation)., Disp: , Rfl:    Continuous Glucose Sensor (FREESTYLE LIBRE 3 SENSOR) MISC, 1 each by Does not apply route every 14 (fourteen) days. Please apply for 14 days and then switch to new sensor, Disp: 2 each, Rfl: 11   cyanocobalamin  (VITAMIN B12) 1000 MCG tablet, Take 1,000 mcg by mouth daily., Disp: , Rfl:    DULoxetine  (CYMBALTA ) 30 MG capsule, Take 1 capsule (30 mg total) by mouth daily., Disp: 90 capsule, Rfl: 1   Fluticasone -Umeclidin-Vilant (TRELEGY ELLIPTA ) 100-62.5-25 MCG/ACT AEPB, Inhale 1 puff into the lungs daily in the afternoon., Disp: 60 each, Rfl: 5   furosemide  (LASIX ) 40 MG tablet, Take 1 tablet (40 mg total) by mouth daily., Disp: 30 tablet, Rfl: 0   HYDROcodone -acetaminophen  (NORCO/VICODIN) 5-325 MG tablet, Take 1 tablet by mouth every 6 (six) hours as needed for moderate pain (pain score 4-6)., Disp: 8 tablet, Rfl: 0   levothyroxine  (SYNTHROID ) 175 MCG tablet, Take 1 tablet (175 mcg total) by mouth daily before breakfast., Disp: 90 tablet, Rfl: 1   Melatonin 10 MG TABS, Take 10 mg by mouth at bedtime., Disp: , Rfl:    midodrine  (PROAMATINE ) 10 MG tablet, Take 10 mg by mouth every Monday, Wednesday, and Friday. Before dialysis, Disp: , Rfl:    pantoprazole  (PROTONIX ) 40 MG tablet, Take 1 tablet (40 mg total) by  mouth daily., Disp: 30 tablet, Rfl: 1  Allergies:  Allergies  Allergen Reactions   Pregabalin Other (See Comments)    Elevated LFTs   Ferrous Sulfate  Nausea And Vomiting, Nausea Only and Other (See Comments)    325 mg once a day caused EXTREME NAUSEA   Lisinopril Nausea Only   Merbromin Hives   Thimerosal (Thiomersal) Hives    Past Medical History, Surgical history, Social history, and Family History were reviewed and updated.  Review of Systems: Review of Systems  Constitutional: Negative.   HENT:   Negative.    Eyes: Negative.   Respiratory: Negative.    Cardiovascular: Negative.   Gastrointestinal: Negative.   Endocrine: Negative.   Genitourinary: Negative.    Musculoskeletal:  Positive for back pain (chronic unchanged).  Skin: Negative.   Neurological: Negative.   Hematological: Negative.   Psychiatric/Behavioral: Negative.      Physical Exam:  height is 5' 8 (1.727 m) and weight is 155 lb 1.9 oz (70.4 kg). His oral temperature is 98.7 F (37.1 C). His blood pressure is 134/64 and his pulse is 79. His respiration is 18 and oxygen saturation is 97%.   Wt Readings from Last 3 Encounters:  08/17/23 155 lb 1.9 oz (70.4 kg)  08/14/23 158 lb (71.7 kg)  08/01/23 156 lb 1.9 oz (70.8 kg)    Physical Exam Vitals reviewed.  Constitutional:      General: He is not in acute distress. HENT:     Head: Normocephalic and atraumatic.  Eyes:     Pupils: Pupils are equal, round, and reactive to light.  Cardiovascular:     Rate and Rhythm: Normal rate and regular rhythm.     Heart sounds: Normal heart sounds.     Comments: Cardiac exam is regular rate and rhythm.  He has no murmurs, rubs or bruits. Pulmonary:     Effort: Pulmonary effort is normal. No respiratory distress.     Breath sounds: Normal breath sounds. No stridor. No wheezing, rhonchi or rales.     Comments: Some LL rhonchi Abdominal:     General: Bowel sounds are normal.     Palpations: Abdomen is soft.  Musculoskeletal:        General: No tenderness or deformity. Normal range of motion.     Cervical back: Normal range of motion.  Lymphadenopathy:     Cervical: No cervical adenopathy.  Skin:    General: Skin is warm and dry.     Findings: No erythema or rash.  Neurological:     Mental Status: He is alert and oriented to person, place, and time.  Psychiatric:        Behavior: Behavior normal.        Thought Content: Thought content normal.        Judgment: Judgment normal.     Lab Results  Component Value  Date   WBC 10.5 08/17/2023   HGB 8.6 (L) 08/17/2023   HCT 26.9 (L) 08/17/2023   MCV 90.0 08/17/2023   PLT 314 08/17/2023     Chemistry      Component Value Date/Time   NA 129 (L) 08/14/2023 0905   NA 132 (L) 07/11/2023 0000   K 4.1 08/14/2023 0905   CL 94 (L) 08/14/2023 0905   CO2 25 08/01/2023 1344   BUN 36 (H) 08/14/2023 0905   BUN 77 (HH) 07/11/2023 0000   CREATININE 4.90 (H) 08/14/2023 0905   CREATININE 4.63 (H) 08/01/2023 1344   CREATININE 1.44 (H) 01/11/2022 0945  GLU 101 04/01/2013 0000      Component Value Date/Time   CALCIUM  9.6 08/01/2023 1344   ALKPHOS 81 08/01/2023 1344   AST 12 (L) 08/01/2023 1344   ALT 7 08/01/2023 1344   BILITOT 0.3 08/01/2023 1344     No diagnosis found.   Impression and Plan: Mr. Rommel is a very nice 73 year old white male with symptomatic anemia. Multifactorial anemia. He has Crescentic Glomerulonephritis, Pauci-Immune Type. He is now on dialysis who, per patient, is managing his Aranesp . He gets PRBC here PRN. His last transfusion was on 08/03/2023. 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 Fannie at [facility].   He is to confirm that he is getting Aranesp  at Nephrology/Dialysis Hgb 8.6 today which is stable. No blood needed today He had a normocytic BMB in 06/29/2023  RTC 2 weeks for APP, labs(CBC, iron , ferritin, retic, B12, folate, sample) (Day 1), 1 unit of blood (Day 2)  Lauraine CHRISTELLA Dais, PA-C 8/7/202512:48 PM

## 2023-08-17 NOTE — Patient Instructions (Signed)
 Ask nephrology about how often you are getting Aranesp  or another Erythropoietin  stimulating medication

## 2023-08-18 ENCOUNTER — Telehealth: Payer: Self-pay

## 2023-08-18 ENCOUNTER — Other Ambulatory Visit: Payer: Self-pay

## 2023-08-18 ENCOUNTER — Telehealth: Payer: Self-pay | Admitting: Pulmonary Disease

## 2023-08-18 DIAGNOSIS — D509 Iron deficiency anemia, unspecified: Secondary | ICD-10-CM | POA: Diagnosis not present

## 2023-08-18 DIAGNOSIS — Z23 Encounter for immunization: Secondary | ICD-10-CM | POA: Diagnosis not present

## 2023-08-18 DIAGNOSIS — C3491 Malignant neoplasm of unspecified part of right bronchus or lung: Secondary | ICD-10-CM

## 2023-08-18 DIAGNOSIS — E211 Secondary hyperparathyroidism, not elsewhere classified: Secondary | ICD-10-CM | POA: Diagnosis not present

## 2023-08-18 DIAGNOSIS — Z992 Dependence on renal dialysis: Secondary | ICD-10-CM | POA: Diagnosis not present

## 2023-08-18 DIAGNOSIS — D631 Anemia in chronic kidney disease: Secondary | ICD-10-CM | POA: Diagnosis not present

## 2023-08-18 DIAGNOSIS — R918 Other nonspecific abnormal finding of lung field: Secondary | ICD-10-CM

## 2023-08-18 DIAGNOSIS — D689 Coagulation defect, unspecified: Secondary | ICD-10-CM | POA: Diagnosis not present

## 2023-08-18 DIAGNOSIS — N186 End stage renal disease: Secondary | ICD-10-CM | POA: Diagnosis not present

## 2023-08-18 NOTE — Telephone Encounter (Unsigned)
 Copied from CRM #[id number]. Topic: Referral - Question >> Aug 18, 2023 11:43 AM Rozanna MATSU wrote: Reason for CRM: PT CALLED ABOUT THE REFERRAL FROM SARAH COVINGTON STATED HE IS NOT DRIVING TO [place] LOOKS LIKE THERE ARE TWO REFERRALS FOR HIM. HE WANTS TO GO TO THE [address] LOCATION. HE WILL CALL BACK TO SCHEUDLE THE APPT

## 2023-08-18 NOTE — Telephone Encounter (Signed)
 LVMTCB to schedule pulmonary consult with Dr. Tamea.

## 2023-08-21 DIAGNOSIS — D689 Coagulation defect, unspecified: Secondary | ICD-10-CM | POA: Diagnosis not present

## 2023-08-21 DIAGNOSIS — D631 Anemia in chronic kidney disease: Secondary | ICD-10-CM | POA: Diagnosis not present

## 2023-08-21 DIAGNOSIS — E211 Secondary hyperparathyroidism, not elsewhere classified: Secondary | ICD-10-CM | POA: Diagnosis not present

## 2023-08-21 DIAGNOSIS — Z23 Encounter for immunization: Secondary | ICD-10-CM | POA: Diagnosis not present

## 2023-08-21 DIAGNOSIS — Z992 Dependence on renal dialysis: Secondary | ICD-10-CM | POA: Diagnosis not present

## 2023-08-21 DIAGNOSIS — N186 End stage renal disease: Secondary | ICD-10-CM | POA: Diagnosis not present

## 2023-08-21 DIAGNOSIS — D509 Iron deficiency anemia, unspecified: Secondary | ICD-10-CM | POA: Diagnosis not present

## 2023-08-22 ENCOUNTER — Other Ambulatory Visit: Payer: Self-pay

## 2023-08-22 DIAGNOSIS — N186 End stage renal disease: Secondary | ICD-10-CM

## 2023-08-23 DIAGNOSIS — Z992 Dependence on renal dialysis: Secondary | ICD-10-CM | POA: Diagnosis not present

## 2023-08-23 DIAGNOSIS — D689 Coagulation defect, unspecified: Secondary | ICD-10-CM | POA: Diagnosis not present

## 2023-08-23 DIAGNOSIS — E211 Secondary hyperparathyroidism, not elsewhere classified: Secondary | ICD-10-CM | POA: Diagnosis not present

## 2023-08-23 DIAGNOSIS — D631 Anemia in chronic kidney disease: Secondary | ICD-10-CM | POA: Diagnosis not present

## 2023-08-23 DIAGNOSIS — D509 Iron deficiency anemia, unspecified: Secondary | ICD-10-CM | POA: Diagnosis not present

## 2023-08-23 DIAGNOSIS — N186 End stage renal disease: Secondary | ICD-10-CM | POA: Diagnosis not present

## 2023-08-23 DIAGNOSIS — Z23 Encounter for immunization: Secondary | ICD-10-CM | POA: Diagnosis not present

## 2023-08-25 DIAGNOSIS — Z23 Encounter for immunization: Secondary | ICD-10-CM | POA: Diagnosis not present

## 2023-08-25 DIAGNOSIS — D689 Coagulation defect, unspecified: Secondary | ICD-10-CM | POA: Diagnosis not present

## 2023-08-25 DIAGNOSIS — N186 End stage renal disease: Secondary | ICD-10-CM | POA: Diagnosis not present

## 2023-08-25 DIAGNOSIS — Z992 Dependence on renal dialysis: Secondary | ICD-10-CM | POA: Diagnosis not present

## 2023-08-25 DIAGNOSIS — E211 Secondary hyperparathyroidism, not elsewhere classified: Secondary | ICD-10-CM | POA: Diagnosis not present

## 2023-08-25 DIAGNOSIS — D509 Iron deficiency anemia, unspecified: Secondary | ICD-10-CM | POA: Diagnosis not present

## 2023-08-25 DIAGNOSIS — D631 Anemia in chronic kidney disease: Secondary | ICD-10-CM | POA: Diagnosis not present

## 2023-08-28 DIAGNOSIS — E211 Secondary hyperparathyroidism, not elsewhere classified: Secondary | ICD-10-CM | POA: Diagnosis not present

## 2023-08-28 DIAGNOSIS — Z23 Encounter for immunization: Secondary | ICD-10-CM | POA: Diagnosis not present

## 2023-08-28 DIAGNOSIS — Z992 Dependence on renal dialysis: Secondary | ICD-10-CM | POA: Diagnosis not present

## 2023-08-28 DIAGNOSIS — D631 Anemia in chronic kidney disease: Secondary | ICD-10-CM | POA: Diagnosis not present

## 2023-08-28 DIAGNOSIS — D689 Coagulation defect, unspecified: Secondary | ICD-10-CM | POA: Diagnosis not present

## 2023-08-28 DIAGNOSIS — N186 End stage renal disease: Secondary | ICD-10-CM | POA: Diagnosis not present

## 2023-08-28 DIAGNOSIS — D509 Iron deficiency anemia, unspecified: Secondary | ICD-10-CM | POA: Diagnosis not present

## 2023-08-29 ENCOUNTER — Other Ambulatory Visit: Payer: Self-pay | Admitting: Medical Oncology

## 2023-08-29 ENCOUNTER — Inpatient Hospital Stay: Admitting: Medical Oncology

## 2023-08-29 ENCOUNTER — Inpatient Hospital Stay

## 2023-08-29 ENCOUNTER — Ambulatory Visit: Admitting: Physician Assistant

## 2023-08-29 ENCOUNTER — Encounter: Payer: Self-pay | Admitting: Medical Oncology

## 2023-08-29 VITALS — BP 127/60 | HR 77 | Temp 98.6°F | Resp 18 | Ht 68.0 in | Wt 153.0 lb

## 2023-08-29 DIAGNOSIS — D631 Anemia in chronic kidney disease: Secondary | ICD-10-CM

## 2023-08-29 DIAGNOSIS — D500 Iron deficiency anemia secondary to blood loss (chronic): Secondary | ICD-10-CM | POA: Diagnosis not present

## 2023-08-29 DIAGNOSIS — D509 Iron deficiency anemia, unspecified: Secondary | ICD-10-CM

## 2023-08-29 DIAGNOSIS — N189 Chronic kidney disease, unspecified: Secondary | ICD-10-CM | POA: Diagnosis not present

## 2023-08-29 DIAGNOSIS — D696 Thrombocytopenia, unspecified: Secondary | ICD-10-CM

## 2023-08-29 DIAGNOSIS — E639 Nutritional deficiency, unspecified: Secondary | ICD-10-CM

## 2023-08-29 DIAGNOSIS — D649 Anemia, unspecified: Secondary | ICD-10-CM | POA: Diagnosis not present

## 2023-08-29 DIAGNOSIS — Z85110 Personal history of malignant carcinoid tumor of bronchus and lung: Secondary | ICD-10-CM | POA: Diagnosis not present

## 2023-08-29 DIAGNOSIS — N183 Chronic kidney disease, stage 3 unspecified: Secondary | ICD-10-CM

## 2023-08-29 DIAGNOSIS — Z992 Dependence on renal dialysis: Secondary | ICD-10-CM | POA: Diagnosis not present

## 2023-08-29 LAB — CBC WITH DIFFERENTIAL (CANCER CENTER ONLY)
Abs Immature Granulocytes: 0.07 K/uL (ref 0.00–0.07)
Basophils Absolute: 0.1 K/uL (ref 0.0–0.1)
Basophils Relative: 2 %
Eosinophils Absolute: 0.4 K/uL (ref 0.0–0.5)
Eosinophils Relative: 4 %
HCT: 27.1 % — ABNORMAL LOW (ref 39.0–52.0)
Hemoglobin: 8.7 g/dL — ABNORMAL LOW (ref 13.0–17.0)
Immature Granulocytes: 1 %
Lymphocytes Relative: 15 %
Lymphs Abs: 1.5 K/uL (ref 0.7–4.0)
MCH: 29.2 pg (ref 26.0–34.0)
MCHC: 32.1 g/dL (ref 30.0–36.0)
MCV: 90.9 fL (ref 80.0–100.0)
Monocytes Absolute: 1.2 K/uL — ABNORMAL HIGH (ref 0.1–1.0)
Monocytes Relative: 12 %
Neutro Abs: 6.4 K/uL (ref 1.7–7.7)
Neutrophils Relative %: 66 %
Platelet Count: 291 K/uL (ref 150–400)
RBC: 2.98 MIL/uL — ABNORMAL LOW (ref 4.22–5.81)
RDW: 15.6 % — ABNORMAL HIGH (ref 11.5–15.5)
WBC Count: 9.6 K/uL (ref 4.0–10.5)
nRBC: 0 % (ref 0.0–0.2)

## 2023-08-29 LAB — CMP (CANCER CENTER ONLY)
ALT: 5 U/L (ref 0–44)
AST: 13 U/L — ABNORMAL LOW (ref 15–41)
Albumin: 4 g/dL (ref 3.5–5.0)
Alkaline Phosphatase: 69 U/L (ref 38–126)
Anion gap: 13 (ref 5–15)
BUN: 13 mg/dL (ref 8–23)
CO2: 30 mmol/L (ref 22–32)
Calcium: 9.7 mg/dL (ref 8.9–10.3)
Chloride: 95 mmol/L — ABNORMAL LOW (ref 98–111)
Creatinine: 3.47 mg/dL — ABNORMAL HIGH (ref 0.61–1.24)
GFR, Estimated: 18 mL/min — ABNORMAL LOW (ref >60)
Glucose, Bld: 91 mg/dL (ref 70–99)
Potassium: 4 mmol/L (ref 3.5–5.1)
Sodium: 138 mmol/L (ref 135–145)
Total Bilirubin: 0.3 mg/dL (ref 0.0–1.2)
Total Protein: 6.9 g/dL (ref 6.5–8.1)

## 2023-08-29 LAB — IRON AND IRON BINDING CAPACITY (CC-WL,HP ONLY)
Iron: 43 ug/dL — ABNORMAL LOW (ref 45–182)
Saturation Ratios: 15 % — ABNORMAL LOW (ref 17.9–39.5)
TIBC: 280 ug/dL (ref 250–450)
UIBC: 237 ug/dL

## 2023-08-29 LAB — RETIC PANEL
Immature Retic Fract: 5.9 % (ref 2.3–15.9)
RBC.: 2.97 MIL/uL — ABNORMAL LOW (ref 4.22–5.81)
Retic Count, Absolute: 37.4 K/uL (ref 19.0–186.0)
Retic Ct Pct: 1.3 % (ref 0.4–3.1)
Reticulocyte Hemoglobin: 33 pg (ref >27.9)

## 2023-08-29 LAB — VITAMIN B12: Vitamin B-12: 1242 pg/mL — ABNORMAL HIGH (ref 180–914)

## 2023-08-29 LAB — SAMPLE TO BLOOD BANK

## 2023-08-29 LAB — FOLATE: Folate: >40 ng/mL (ref >5.9)

## 2023-08-29 LAB — FERRITIN: Ferritin: 644 ng/mL — ABNORMAL HIGH (ref 24–336)

## 2023-08-29 NOTE — Progress Notes (Signed)
 Hematology and Oncology Follow Up Visit  Walter Wilkerson [id number] 1950/12/10 73 y.o. 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 Fannie at [facility]. Anemia of iron  deficiency  Crescentic Glomerulonephritis, Pauci-immune Type.  Anemia of erythropoietin  deficiency  Current Therapy:    IV Iron  -Venover 300 mg given 12/28/2022  Aranesp  300 mcg subcu for hemoglobin less than 11- managed by nephrology     Interim History:  Walter Wilkerson is seen today for follow up and consideration of blood products or IV iron .    He has Crescentic Glomerulonephritis, Pauci-immune Type.   He currently goes to Dr. Dennise is treating him at [facility]). He gets dialysis M,W,F. In the past it was thought that he was getting Aranesp  or similar at this center. Today he thinks that he gets iron  and vitamin D . He does not believe that he is getting Aranesp .   His last blood transfusion was on 08/03/2023  There has been no bleeding to his knowledge: denies epistaxis, gingivitis, hemoptysis, hematemesis, hematuria, melena, excessive bruising, blood donation.   He has been hydrating like normal. Urinating like normal. No N/V/D.   There is been no problems with leg swelling.  He has had no rashes.  Eating and drinking well.   Currently, I would say his performance status is probably ECOG 2.  Wt Readings from Last 3 Encounters:  08/29/23 153 lb 0.6 oz (69.4 kg)  08/17/23 155 lb 1.9 oz (70.4 kg)  08/14/23 158 lb (71.7 kg)    Medications:  Current Outpatient Medications:    aspirin  EC 81 MG tablet, Take 81 mg by mouth daily., Disp: , Rfl:    atorvastatin  (LIPITOR) 40 MG tablet, Take 1 tablet (40 mg total) by mouth daily., Disp: 90 tablet, Rfl: 3   clindamycin (CLEOCIN T) 1 % external solution, Apply 1 Application topically 2 (two) times daily as  needed (for scalp irritation)., Disp: , Rfl:    clobetasol cream (TEMOVATE) 0.05 %, Apply 1 Application topically 2 (two) times daily as needed (for scalp irritation)., Disp: , Rfl:    Continuous Glucose Sensor (FREESTYLE LIBRE 3 SENSOR) MISC, 1 each by Does not apply route every 14 (fourteen) days. Please apply for 14 days and then switch to new sensor, Disp: 2 each, Rfl: 11   cyanocobalamin  (VITAMIN B12) 1000 MCG tablet, Take 1,000 mcg by mouth daily., Disp: , Rfl:    DULoxetine  (CYMBALTA ) 30 MG capsule, Take 1 capsule (30 mg total) by mouth daily., Disp: 90 capsule, Rfl: 1   Fluticasone -Umeclidin-Vilant (TRELEGY ELLIPTA ) 100-62.5-25 MCG/ACT AEPB, Inhale 1 puff into the lungs daily in the afternoon., Disp: 60 each, Rfl: 5   furosemide  (LASIX ) 40 MG tablet, Take 1 tablet (40 mg total) by mouth daily., Disp: 30 tablet, Rfl: 0   levothyroxine  (SYNTHROID ) 175 MCG tablet, Take 1 tablet (175 mcg total) by mouth daily before breakfast., Disp: 90 tablet, Rfl: 1   Melatonin 10 MG TABS, Take 10 mg by mouth at bedtime., Disp: , Rfl:    midodrine  (PROAMATINE ) 10 MG tablet, Take 10 mg by mouth every Monday, Wednesday, and Friday. Before dialysis, Disp: , Rfl:    pantoprazole  (PROTONIX ) 40 MG tablet, Take 1 tablet (40 mg total) by mouth daily., Disp: 30 tablet, Rfl: 1  Allergies:  Allergies  Allergen Reactions   Pregabalin Other (See Comments)    Elevated LFTs  Ferrous Sulfate  Nausea And Vomiting, Nausea Only and Other (See Comments)    325 mg once a day caused EXTREME NAUSEA   Lisinopril Nausea Only   Merbromin Hives   Thimerosal (Thiomersal) Hives    Past Medical History, Surgical history, Social history, and Family History were reviewed and updated.  Review of Systems: Review of Systems  Constitutional: Negative.   HENT:  Negative.    Eyes: Negative.   Respiratory: Negative.    Cardiovascular: Negative.   Gastrointestinal: Negative.   Endocrine: Negative.   Genitourinary: Negative.     Musculoskeletal:  Positive for back pain (chronic unchanged).  Skin: Negative.   Neurological: Negative.   Hematological: Negative.   Psychiatric/Behavioral: Negative.      Physical Exam:  height is 5' 8 (1.727 m) and weight is 153 lb 0.6 oz (69.4 kg). His oral temperature is 98.6 F (37 C). His blood pressure is 127/60 and his pulse is 77. His respiration is 18 and oxygen saturation is 100%.   Wt Readings from Last 3 Encounters:  08/29/23 153 lb 0.6 oz (69.4 kg)  08/17/23 155 lb 1.9 oz (70.4 kg)  08/14/23 158 lb (71.7 kg)    Physical Exam Vitals reviewed.  Constitutional:      General: He is not in acute distress. HENT:     Head: Normocephalic and atraumatic.  Eyes:     Pupils: Pupils are equal, round, and reactive to light.  Cardiovascular:     Rate and Rhythm: Normal rate and regular rhythm.     Heart sounds: Normal heart sounds.     Comments: Cardiac exam is regular rate and rhythm.  He has no murmurs, rubs or bruits. Pulmonary:     Effort: Pulmonary effort is normal. No respiratory distress.     Breath sounds: Normal breath sounds. No stridor. No wheezing, rhonchi or rales.     Comments: Some LL rhonchi Abdominal:     General: Bowel sounds are normal.     Palpations: Abdomen is soft.  Musculoskeletal:        General: No tenderness or deformity. Normal range of motion.     Cervical back: Normal range of motion.  Lymphadenopathy:     Cervical: No cervical adenopathy.  Skin:    General: Skin is warm and dry.     Findings: No erythema or rash.  Neurological:     Mental Status: He is alert and oriented to person, place, and time.  Psychiatric:        Behavior: Behavior normal.        Thought Content: Thought content normal.        Judgment: Judgment normal.     Lab Results  Component Value Date   WBC 9.6 08/29/2023   HGB 8.7 (L) 08/29/2023   HCT 27.1 (L) 08/29/2023   MCV 90.9 08/29/2023   PLT 291 08/29/2023     Chemistry      Component Value Date/Time    NA 129 (L) 08/14/2023 0905   NA 132 (L) 07/11/2023 0000   K 4.1 08/14/2023 0905   CL 94 (L) 08/14/2023 0905   CO2 25 08/01/2023 1344   BUN 36 (H) 08/14/2023 0905   BUN 77 (HH) 07/11/2023 0000   CREATININE 4.90 (H) 08/14/2023 0905   CREATININE 4.63 (H) 08/01/2023 1344   CREATININE 1.44 (H) 01/11/2022 0945   GLU 101 04/01/2013 0000      Component Value Date/Time   CALCIUM  9.6 08/01/2023 1344   ALKPHOS 81 08/01/2023 1344  AST 12 (L) 08/01/2023 1344   ALT 7 08/01/2023 1344   BILITOT 0.3 08/01/2023 1344     No diagnosis found.   Impression and Plan: Mr. Bruins is a very nice 73 year old white male with symptomatic anemia. Multifactorial anemia. He has Crescentic Glomerulonephritis, Pauci-Immune Type. He is now on dialysis who, per patient, is managing his Aranesp . He gets PRBC here PRN. His last transfusion was on 08/03/2023. 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 Fannie at [facility].   Reaching out to his Nephrologist today to confirm what he is receiving at Dialysis and to see if they are willing to take over his Aranesp - if not we will continue to administer this here. If they are managing his iron  levels we will not need to do so as well here.   Hgb 8.7 today which is stable. No blood needed today He had a normocytic BMB in 06/29/2023  RTC 2 weeks for APP, labs(CBC, sample) (Day 1), 1 unit of blood (Day 2)  Lauraine CHRISTELLA Dais, PA-C 8/19/202510:52 AM

## 2023-08-30 ENCOUNTER — Telehealth: Payer: Self-pay | Admitting: *Deleted

## 2023-08-30 ENCOUNTER — Inpatient Hospital Stay

## 2023-08-30 ENCOUNTER — Ambulatory Visit: Admitting: Medical Oncology

## 2023-08-30 DIAGNOSIS — Z992 Dependence on renal dialysis: Secondary | ICD-10-CM | POA: Diagnosis not present

## 2023-08-30 DIAGNOSIS — D689 Coagulation defect, unspecified: Secondary | ICD-10-CM | POA: Diagnosis not present

## 2023-08-30 DIAGNOSIS — E211 Secondary hyperparathyroidism, not elsewhere classified: Secondary | ICD-10-CM | POA: Diagnosis not present

## 2023-08-30 DIAGNOSIS — D631 Anemia in chronic kidney disease: Secondary | ICD-10-CM | POA: Diagnosis not present

## 2023-08-30 DIAGNOSIS — D509 Iron deficiency anemia, unspecified: Secondary | ICD-10-CM | POA: Diagnosis not present

## 2023-08-30 DIAGNOSIS — N186 End stage renal disease: Secondary | ICD-10-CM | POA: Diagnosis not present

## 2023-08-30 DIAGNOSIS — Z23 Encounter for immunization: Secondary | ICD-10-CM | POA: Diagnosis not present

## 2023-08-30 NOTE — Telephone Encounter (Signed)
 Per Lauraine Dais, PA, I called and left this message on [facility] voice mail. Turn around time is 24 - 48 hours.   Can we contact Walter Wilkerson- Dr. Aureliano office. We had him on Aranesp  here. He then thought he was getting this or similar med at dialysis so we stopped. Can they confirm what he gets and if they can take over this TX.

## 2023-08-31 ENCOUNTER — Inpatient Hospital Stay

## 2023-09-01 DIAGNOSIS — N186 End stage renal disease: Secondary | ICD-10-CM | POA: Diagnosis not present

## 2023-09-01 DIAGNOSIS — D689 Coagulation defect, unspecified: Secondary | ICD-10-CM | POA: Diagnosis not present

## 2023-09-01 DIAGNOSIS — D631 Anemia in chronic kidney disease: Secondary | ICD-10-CM | POA: Diagnosis not present

## 2023-09-01 DIAGNOSIS — Z992 Dependence on renal dialysis: Secondary | ICD-10-CM | POA: Diagnosis not present

## 2023-09-01 DIAGNOSIS — D509 Iron deficiency anemia, unspecified: Secondary | ICD-10-CM | POA: Diagnosis not present

## 2023-09-01 DIAGNOSIS — Z23 Encounter for immunization: Secondary | ICD-10-CM | POA: Diagnosis not present

## 2023-09-01 DIAGNOSIS — E211 Secondary hyperparathyroidism, not elsewhere classified: Secondary | ICD-10-CM | POA: Diagnosis not present

## 2023-09-04 ENCOUNTER — Ambulatory Visit: Payer: Self-pay | Admitting: Medical Oncology

## 2023-09-04 DIAGNOSIS — D689 Coagulation defect, unspecified: Secondary | ICD-10-CM | POA: Diagnosis not present

## 2023-09-04 DIAGNOSIS — E211 Secondary hyperparathyroidism, not elsewhere classified: Secondary | ICD-10-CM | POA: Diagnosis not present

## 2023-09-04 DIAGNOSIS — D631 Anemia in chronic kidney disease: Secondary | ICD-10-CM | POA: Diagnosis not present

## 2023-09-04 DIAGNOSIS — Z23 Encounter for immunization: Secondary | ICD-10-CM | POA: Diagnosis not present

## 2023-09-04 DIAGNOSIS — D509 Iron deficiency anemia, unspecified: Secondary | ICD-10-CM | POA: Diagnosis not present

## 2023-09-04 DIAGNOSIS — Z992 Dependence on renal dialysis: Secondary | ICD-10-CM | POA: Diagnosis not present

## 2023-09-04 DIAGNOSIS — N186 End stage renal disease: Secondary | ICD-10-CM | POA: Diagnosis not present

## 2023-09-05 ENCOUNTER — Ambulatory Visit (INDEPENDENT_AMBULATORY_CARE_PROVIDER_SITE_OTHER): Admitting: Physician Assistant

## 2023-09-05 VITALS — BP 127/62 | HR 75 | Ht 68.0 in | Wt 152.0 lb

## 2023-09-05 DIAGNOSIS — T50905A Adverse effect of unspecified drugs, medicaments and biological substances, initial encounter: Secondary | ICD-10-CM

## 2023-09-05 DIAGNOSIS — M130 Polyarthritis, unspecified: Secondary | ICD-10-CM | POA: Diagnosis not present

## 2023-09-05 DIAGNOSIS — N019 Rapidly progressive nephritic syndrome with unspecified morphologic changes: Secondary | ICD-10-CM

## 2023-09-05 DIAGNOSIS — I10 Essential (primary) hypertension: Secondary | ICD-10-CM | POA: Diagnosis not present

## 2023-09-05 DIAGNOSIS — J432 Centrilobular emphysema: Secondary | ICD-10-CM | POA: Diagnosis not present

## 2023-09-05 DIAGNOSIS — R739 Hyperglycemia, unspecified: Secondary | ICD-10-CM

## 2023-09-05 DIAGNOSIS — N186 End stage renal disease: Secondary | ICD-10-CM

## 2023-09-05 DIAGNOSIS — E039 Hypothyroidism, unspecified: Secondary | ICD-10-CM | POA: Diagnosis not present

## 2023-09-05 DIAGNOSIS — G894 Chronic pain syndrome: Secondary | ICD-10-CM

## 2023-09-05 LAB — POCT GLYCOSYLATED HEMOGLOBIN (HGB A1C): Hemoglobin A1C: 5.2 % (ref 4.0–5.6)

## 2023-09-05 MED ORDER — HYDROCODONE-ACETAMINOPHEN 7.5-325 MG PO TABS: 1.0000 | ORAL_TABLET | ORAL | 0 refills | Status: DC | PRN

## 2023-09-05 NOTE — Progress Notes (Unsigned)
 Established Patient Office Visit  Subjective   Patient ID: Walter Wilkerson, male    DOB: Mar 06, 1950  Age: 73 y.o. 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   Pt was having high glucose readings when on prednisone . He is now off and sugars have normalized. He is off all insulin .   His BP have improved as well and not on any BP medication.   He has dialysis three times a week, M/W/F. He is trying to transition into home for dialysis. He is supposed to be trying a new drug for his kidney condition that could be hopeful. He is seeing nephrology regularly.  His breathing is stable. No concerns.   He continues to have a lot of joint pain all over. His hands are very stiff. It is worse on days of dialysis. He cannot take NSAIDs. He used to be on norco 3 times a day. He does not want to get back to that.    ROS See HPI.    Objective:     BP 127/62   Pulse 75   Ht 5' 8 (1.727 m)   Wt 152 lb (68.9 kg)   SpO2 99%   BMI 23.11 kg/m  BP Readings from Last 3 Encounters:  09/05/23 127/62  08/29/23 127/60  08/17/23 134/64   Wt Readings from Last 3 Encounters:  09/05/23 152 lb (68.9 kg)  08/29/23 153 lb 0.6 oz (69.4 kg)  08/17/23 155 lb 1.9 oz (70.4 kg)      Physical Exam Constitutional:      Appearance: Normal appearance.  Cardiovascular:     Rate and Rhythm: Normal rate and regular rhythm.  Pulmonary:     Effort: Pulmonary effort is normal.     Breath sounds: Normal breath sounds.  Musculoskeletal:     Right lower leg: No edema.     Left lower leg: No edema.  Skin:    Comments: Left upper arm fistula in place.   Neurological:     General: No focal deficit present.     Mental Status: He is alert and oriented to person, place, and time.   Psychiatric:        Mood and Affect: Mood normal.      Results for orders placed or performed in visit on 09/05/23  POCT HgB A1C  Result Value Ref Range   Hemoglobin A1C 5.2 4.0 - 5.6 %   HbA1c POC (<> result, manual entry)     HbA1c, POC (prediabetic range)     HbA1c, POC (controlled diabetic range)         Assessment & Plan:  Walter Wilkerson was seen today for medical management of chronic issues.  Diagnoses and all orders for this visit:  Drug-induced hyperglycemia -     POCT HgB A1C  ESRD (end stage renal disease) (HCC)  Polyarthritis -     HYDROcodone -acetaminophen  (NORCO) 7.5-325 MG tablet; Take 1 tablet by mouth every 4 (four) hours as needed for moderate pain (pain score 4-6) (cough).  Centrilobular emphysema (HCC)  Essential hypertension  Acquired hypothyroidism  Pauci-immune RPGN (rapidly progressive glomerulonephritis)  Chronic pain syndrome   Labs are UTD Vitals look good A1C looks great today No medication refills needed  .SABRAPDMP reviewed during this encounter. Pt  cannot take NSAIDs. No concerns Norco given to take on bad days of pain after dialysis. Follow up in 6 months  ESRD on dialysis/RPGN managed by nephrology.   COPD to goal today Continue on trelegy.      Bobak Oguinn, PA-C

## 2023-09-06 DIAGNOSIS — N186 End stage renal disease: Secondary | ICD-10-CM | POA: Diagnosis not present

## 2023-09-06 DIAGNOSIS — Z992 Dependence on renal dialysis: Secondary | ICD-10-CM | POA: Diagnosis not present

## 2023-09-06 DIAGNOSIS — Z23 Encounter for immunization: Secondary | ICD-10-CM | POA: Diagnosis not present

## 2023-09-06 DIAGNOSIS — D631 Anemia in chronic kidney disease: Secondary | ICD-10-CM | POA: Diagnosis not present

## 2023-09-06 DIAGNOSIS — E211 Secondary hyperparathyroidism, not elsewhere classified: Secondary | ICD-10-CM | POA: Diagnosis not present

## 2023-09-06 DIAGNOSIS — D689 Coagulation defect, unspecified: Secondary | ICD-10-CM | POA: Diagnosis not present

## 2023-09-06 DIAGNOSIS — D509 Iron deficiency anemia, unspecified: Secondary | ICD-10-CM | POA: Diagnosis not present

## 2023-09-08 DIAGNOSIS — D631 Anemia in chronic kidney disease: Secondary | ICD-10-CM | POA: Diagnosis not present

## 2023-09-08 DIAGNOSIS — D689 Coagulation defect, unspecified: Secondary | ICD-10-CM | POA: Diagnosis not present

## 2023-09-08 DIAGNOSIS — N186 End stage renal disease: Secondary | ICD-10-CM | POA: Diagnosis not present

## 2023-09-08 DIAGNOSIS — E211 Secondary hyperparathyroidism, not elsewhere classified: Secondary | ICD-10-CM | POA: Diagnosis not present

## 2023-09-08 DIAGNOSIS — D509 Iron deficiency anemia, unspecified: Secondary | ICD-10-CM | POA: Diagnosis not present

## 2023-09-08 DIAGNOSIS — Z23 Encounter for immunization: Secondary | ICD-10-CM | POA: Diagnosis not present

## 2023-09-08 DIAGNOSIS — Z992 Dependence on renal dialysis: Secondary | ICD-10-CM | POA: Diagnosis not present

## 2023-09-10 DIAGNOSIS — Z992 Dependence on renal dialysis: Secondary | ICD-10-CM | POA: Diagnosis not present

## 2023-09-10 DIAGNOSIS — N037 Chronic nephritic syndrome with diffuse crescentic glomerulonephritis: Secondary | ICD-10-CM | POA: Diagnosis not present

## 2023-09-10 DIAGNOSIS — N186 End stage renal disease: Secondary | ICD-10-CM | POA: Diagnosis not present

## 2023-09-11 DIAGNOSIS — Z23 Encounter for immunization: Secondary | ICD-10-CM | POA: Diagnosis not present

## 2023-09-11 DIAGNOSIS — D509 Iron deficiency anemia, unspecified: Secondary | ICD-10-CM | POA: Diagnosis not present

## 2023-09-11 DIAGNOSIS — D631 Anemia in chronic kidney disease: Secondary | ICD-10-CM | POA: Diagnosis not present

## 2023-09-11 DIAGNOSIS — Z992 Dependence on renal dialysis: Secondary | ICD-10-CM | POA: Diagnosis not present

## 2023-09-11 DIAGNOSIS — E211 Secondary hyperparathyroidism, not elsewhere classified: Secondary | ICD-10-CM | POA: Diagnosis not present

## 2023-09-11 DIAGNOSIS — D689 Coagulation defect, unspecified: Secondary | ICD-10-CM | POA: Diagnosis not present

## 2023-09-11 DIAGNOSIS — N186 End stage renal disease: Secondary | ICD-10-CM | POA: Diagnosis not present

## 2023-09-12 ENCOUNTER — Encounter: Payer: Self-pay | Admitting: Sports Medicine

## 2023-09-13 DIAGNOSIS — E211 Secondary hyperparathyroidism, not elsewhere classified: Secondary | ICD-10-CM | POA: Diagnosis not present

## 2023-09-13 DIAGNOSIS — D509 Iron deficiency anemia, unspecified: Secondary | ICD-10-CM | POA: Diagnosis not present

## 2023-09-13 DIAGNOSIS — D689 Coagulation defect, unspecified: Secondary | ICD-10-CM | POA: Diagnosis not present

## 2023-09-13 DIAGNOSIS — Z23 Encounter for immunization: Secondary | ICD-10-CM | POA: Diagnosis not present

## 2023-09-13 DIAGNOSIS — D631 Anemia in chronic kidney disease: Secondary | ICD-10-CM | POA: Diagnosis not present

## 2023-09-13 DIAGNOSIS — N186 End stage renal disease: Secondary | ICD-10-CM | POA: Diagnosis not present

## 2023-09-13 DIAGNOSIS — Z992 Dependence on renal dialysis: Secondary | ICD-10-CM | POA: Diagnosis not present

## 2023-09-15 DIAGNOSIS — D689 Coagulation defect, unspecified: Secondary | ICD-10-CM | POA: Diagnosis not present

## 2023-09-15 DIAGNOSIS — Z23 Encounter for immunization: Secondary | ICD-10-CM | POA: Diagnosis not present

## 2023-09-15 DIAGNOSIS — D631 Anemia in chronic kidney disease: Secondary | ICD-10-CM | POA: Diagnosis not present

## 2023-09-15 DIAGNOSIS — D509 Iron deficiency anemia, unspecified: Secondary | ICD-10-CM | POA: Diagnosis not present

## 2023-09-15 DIAGNOSIS — N186 End stage renal disease: Secondary | ICD-10-CM | POA: Diagnosis not present

## 2023-09-15 DIAGNOSIS — Z992 Dependence on renal dialysis: Secondary | ICD-10-CM | POA: Diagnosis not present

## 2023-09-15 DIAGNOSIS — E211 Secondary hyperparathyroidism, not elsewhere classified: Secondary | ICD-10-CM | POA: Diagnosis not present

## 2023-09-18 DIAGNOSIS — E211 Secondary hyperparathyroidism, not elsewhere classified: Secondary | ICD-10-CM | POA: Diagnosis not present

## 2023-09-18 DIAGNOSIS — N186 End stage renal disease: Secondary | ICD-10-CM | POA: Diagnosis not present

## 2023-09-18 DIAGNOSIS — D631 Anemia in chronic kidney disease: Secondary | ICD-10-CM | POA: Diagnosis not present

## 2023-09-18 DIAGNOSIS — Z23 Encounter for immunization: Secondary | ICD-10-CM | POA: Diagnosis not present

## 2023-09-18 DIAGNOSIS — D689 Coagulation defect, unspecified: Secondary | ICD-10-CM | POA: Diagnosis not present

## 2023-09-18 DIAGNOSIS — D509 Iron deficiency anemia, unspecified: Secondary | ICD-10-CM | POA: Diagnosis not present

## 2023-09-18 DIAGNOSIS — Z992 Dependence on renal dialysis: Secondary | ICD-10-CM | POA: Diagnosis not present

## 2023-09-19 ENCOUNTER — Inpatient Hospital Stay: Attending: Hematology & Oncology

## 2023-09-19 ENCOUNTER — Inpatient Hospital Stay: Admitting: Medical Oncology

## 2023-09-19 VITALS — BP 135/59 | HR 76 | Temp 98.7°F | Resp 18 | Ht 68.0 in | Wt 152.1 lb

## 2023-09-19 DIAGNOSIS — D509 Iron deficiency anemia, unspecified: Secondary | ICD-10-CM

## 2023-09-19 DIAGNOSIS — E639 Nutritional deficiency, unspecified: Secondary | ICD-10-CM | POA: Diagnosis not present

## 2023-09-19 DIAGNOSIS — Z992 Dependence on renal dialysis: Secondary | ICD-10-CM | POA: Diagnosis not present

## 2023-09-19 DIAGNOSIS — N019 Rapidly progressive nephritic syndrome with unspecified morphologic changes: Secondary | ICD-10-CM | POA: Diagnosis not present

## 2023-09-19 DIAGNOSIS — D649 Anemia, unspecified: Secondary | ICD-10-CM | POA: Diagnosis not present

## 2023-09-19 DIAGNOSIS — Z85118 Personal history of other malignant neoplasm of bronchus and lung: Secondary | ICD-10-CM | POA: Insufficient documentation

## 2023-09-19 DIAGNOSIS — D696 Thrombocytopenia, unspecified: Secondary | ICD-10-CM | POA: Diagnosis not present

## 2023-09-19 DIAGNOSIS — Z9221 Personal history of antineoplastic chemotherapy: Secondary | ICD-10-CM | POA: Insufficient documentation

## 2023-09-19 DIAGNOSIS — D500 Iron deficiency anemia secondary to blood loss (chronic): Secondary | ICD-10-CM | POA: Diagnosis not present

## 2023-09-19 DIAGNOSIS — D631 Anemia in chronic kidney disease: Secondary | ICD-10-CM

## 2023-09-19 LAB — CBC
HCT: 28 % — ABNORMAL LOW (ref 39.0–52.0)
Hemoglobin: 8.9 g/dL — ABNORMAL LOW (ref 13.0–17.0)
MCH: 29.2 pg (ref 26.0–34.0)
MCHC: 31.8 g/dL (ref 30.0–36.0)
MCV: 91.8 fL (ref 80.0–100.0)
Platelets: 297 K/uL (ref 150–400)
RBC: 3.05 MIL/uL — ABNORMAL LOW (ref 4.22–5.81)
RDW: 16.3 % — ABNORMAL HIGH (ref 11.5–15.5)
WBC: 8 K/uL (ref 4.0–10.5)
nRBC: 0 % (ref 0.0–0.2)

## 2023-09-19 LAB — SAMPLE TO BLOOD BANK

## 2023-09-19 NOTE — Progress Notes (Signed)
 Hematology and Oncology Follow Up Visit  Dawn Kiper [id number] 1950-01-28 73 y.o. 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 Fannie at [facility]. Anemia of iron  deficiency  Crescentic Glomerulonephritis, Pauci-immune Type.  Anemia of erythropoietin  deficiency  Current Therapy:    IV Iron  -Venover 300 mg given 12/28/2022  Aranesp  300 mcg subcu for hemoglobin less than 11- d/c now on similar and managed by Nephrology/Dialysis.      Interim History:  Mr. Wymore is seen today for follow up and consideration of blood products or IV iron .    He has Crescentic Glomerulonephritis, Pauci-immune Type.   He currently goes to Dr. Dennise is treating him at [facility]). He gets dialysis M,W,F. In the past it was thought that he was getting Aranesp  or similar at this center. Today he thinks that he gets iron  and vitamin D . He does not believe that he is getting Aranesp .   His last blood transfusion was on 08/03/2023  There has been no bleeding to his knowledge: denies epistaxis, gingivitis, hemoptysis, hematemesis, hematuria, melena, excessive bruising, blood donation.   Fluid restrictions- 40 ml per day. Urinating like normal. No N/V/D.   There is been no problems with leg swelling.  He has had no rashes.  Eating and drinking well.   Currently, I would say his performance status is probably ECOG 2.  Wt Readings from Last 3 Encounters:  09/19/23 152 lb 1.3 oz (69 kg)  09/05/23 152 lb (68.9 kg)  08/29/23 153 lb 0.6 oz (69.4 kg)    Medications:  Current Outpatient Medications:    aspirin  EC 81 MG tablet, Take 81 mg by mouth daily., Disp: , Rfl:    atorvastatin  (LIPITOR) 40 MG tablet, Take 1 tablet (40 mg total) by mouth daily., Disp: 90 tablet, Rfl: 3   clindamycin (CLEOCIN T) 1 % external solution, Apply 1 Application  topically 2 (two) times daily as needed (for scalp irritation)., Disp: , Rfl:    clobetasol cream (TEMOVATE) 0.05 %, Apply 1 Application topically 2 (two) times daily as needed (for scalp irritation)., Disp: , Rfl:    Continuous Glucose Sensor (FREESTYLE LIBRE 3 SENSOR) MISC, 1 each by Does not apply route every 14 (fourteen) days. Please apply for 14 days and then switch to new sensor, Disp: 2 each, Rfl: 11   cyanocobalamin  (VITAMIN B12) 1000 MCG tablet, Take 1,000 mcg by mouth daily., Disp: , Rfl:    DULoxetine  (CYMBALTA ) 30 MG capsule, Take 1 capsule (30 mg total) by mouth daily., Disp: 90 capsule, Rfl: 1   Fluticasone -Umeclidin-Vilant (TRELEGY ELLIPTA ) 100-62.5-25 MCG/ACT AEPB, Inhale 1 puff into the lungs daily in the afternoon., Disp: 60 each, Rfl: 5   furosemide  (LASIX ) 40 MG tablet, Take 1 tablet (40 mg total) by mouth daily., Disp: 30 tablet, Rfl: 0   HYDROcodone -acetaminophen  (NORCO) 7.5-325 MG tablet, Take 1 tablet by mouth every 4 (four) hours as needed for moderate pain (pain score 4-6) (cough)., Disp: 40 tablet, Rfl: 0   levothyroxine  (SYNTHROID ) 175 MCG tablet, Take 1 tablet (175 mcg total) by mouth daily before breakfast., Disp: 90 tablet, Rfl: 1   Melatonin 10 MG TABS, Take 10 mg by mouth at bedtime., Disp: , Rfl:    midodrine  (PROAMATINE ) 10 MG tablet, Take 10 mg by mouth every Monday, Wednesday, and Friday. Before dialysis, Disp: , Rfl:    pantoprazole  (PROTONIX )  40 MG tablet, Take 1 tablet (40 mg total) by mouth daily., Disp: 30 tablet, Rfl: 1  Allergies:  Allergies  Allergen Reactions   Pregabalin Other (See Comments)    Elevated LFTs   Ferrous Sulfate  Nausea And Vomiting, Nausea Only and Other (See Comments)    325 mg once a day caused EXTREME NAUSEA   Lisinopril Nausea Only   Merbromin Hives   Thimerosal (Thiomersal) Hives    Past Medical History, Surgical history, Social history, and Family History were reviewed and updated.  Review of Systems: Review of Systems   Constitutional: Negative.   HENT:  Negative.    Eyes: Negative.   Respiratory: Negative.    Cardiovascular: Negative.   Gastrointestinal: Negative.   Endocrine: Negative.   Genitourinary: Negative.    Musculoskeletal:  Positive for back pain (chronic unchanged).  Skin: Negative.   Neurological: Negative.   Hematological: Negative.   Psychiatric/Behavioral: Negative.      Physical Exam:  height is 5' 8 (1.727 m) and weight is 152 lb 1.3 oz (69 kg). His oral temperature is 98.7 F (37.1 C). His blood pressure is 135/59 (abnormal) and his pulse is 76. His respiration is 18 and oxygen saturation is 99%.   Wt Readings from Last 3 Encounters:  09/19/23 152 lb 1.3 oz (69 kg)  09/05/23 152 lb (68.9 kg)  08/29/23 153 lb 0.6 oz (69.4 kg)    Physical Exam Vitals reviewed.  Constitutional:      General: He is not in acute distress. HENT:     Head: Normocephalic and atraumatic.  Eyes:     Pupils: Pupils are equal, round, and reactive to light.  Cardiovascular:     Rate and Rhythm: Normal rate and regular rhythm.     Heart sounds: Normal heart sounds.     Comments: Cardiac exam is regular rate and rhythm.  He has no murmurs, rubs or bruits. Pulmonary:     Effort: Pulmonary effort is normal. No respiratory distress.     Breath sounds: Normal breath sounds. No stridor. No wheezing, rhonchi or rales.     Comments: Some LL rhonchi Abdominal:     General: Bowel sounds are normal.     Palpations: Abdomen is soft.  Musculoskeletal:        General: No tenderness or deformity. Normal range of motion.     Cervical back: Normal range of motion.  Lymphadenopathy:     Cervical: No cervical adenopathy.  Skin:    General: Skin is warm and dry.     Findings: No erythema or rash.  Neurological:     Mental Status: He is alert and oriented to person, place, and time.  Psychiatric:        Behavior: Behavior normal.        Thought Content: Thought content normal.        Judgment: Judgment  normal.     Lab Results  Component Value Date   WBC 8.0 09/19/2023   HGB 8.9 (L) 09/19/2023   HCT 28.0 (L) 09/19/2023   MCV 91.8 09/19/2023   PLT 297 09/19/2023     Chemistry      Component Value Date/Time   NA 138 08/29/2023 1030   NA 132 (L) 07/11/2023 0000   K 4.0 08/29/2023 1030   CL 95 (L) 08/29/2023 1030   CO2 30 08/29/2023 1030   BUN 13 08/29/2023 1030   BUN 77 (HH) 07/11/2023 0000   CREATININE 3.47 (H) 08/29/2023 1030   CREATININE 1.44 (H)  01/11/2022 0945   GLU 101 04/01/2013 0000      Component Value Date/Time   CALCIUM  9.7 08/29/2023 1030   ALKPHOS 69 08/29/2023 1030   AST 13 (L) 08/29/2023 1030   ALT 5 08/29/2023 1030   BILITOT 0.3 08/29/2023 1030     Encounter Diagnoses  Name Primary?   Symptomatic anemia Yes   Erythropoietin  deficiency anemia    Iron  deficiency anemia, unspecified iron  deficiency anemia type    Nutritional deficiency    Iron  deficiency anemia due to chronic blood loss    Thrombocytopenia (HCC)    Impression and Plan: Mr. Barrell is a very nice 73 year old white male with symptomatic anemia. Multifactorial anemia. He has Crescentic Glomerulonephritis, Pauci-Immune Type. He is now on dialysis who, per patient, is managing his Aranesp . He gets PRBC here PRN. His last transfusion was on 08/03/2023. 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 Fannie at [facility]    Hgb 8.9 today which is stable. No blood needed today. He has been doing well with more consistent injections at Nephrology. We will move follow up from Q2 weeks to monthly. Labs checked at dialysis. He will call if earlier appointment needed.   RTC 1 month for APP, labs(CBC, sample) (Day 1), 1 unit of blood (Day 3)  Lauraine CHRISTELLA Dais, PA-C 9/9/202511:59 AM

## 2023-09-20 ENCOUNTER — Inpatient Hospital Stay

## 2023-09-20 DIAGNOSIS — D509 Iron deficiency anemia, unspecified: Secondary | ICD-10-CM | POA: Diagnosis not present

## 2023-09-20 DIAGNOSIS — D631 Anemia in chronic kidney disease: Secondary | ICD-10-CM | POA: Diagnosis not present

## 2023-09-20 DIAGNOSIS — Z23 Encounter for immunization: Secondary | ICD-10-CM | POA: Diagnosis not present

## 2023-09-20 DIAGNOSIS — N186 End stage renal disease: Secondary | ICD-10-CM | POA: Diagnosis not present

## 2023-09-20 DIAGNOSIS — E211 Secondary hyperparathyroidism, not elsewhere classified: Secondary | ICD-10-CM | POA: Diagnosis not present

## 2023-09-20 DIAGNOSIS — D689 Coagulation defect, unspecified: Secondary | ICD-10-CM | POA: Diagnosis not present

## 2023-09-20 DIAGNOSIS — Z992 Dependence on renal dialysis: Secondary | ICD-10-CM | POA: Diagnosis not present

## 2023-09-22 DIAGNOSIS — N186 End stage renal disease: Secondary | ICD-10-CM | POA: Diagnosis not present

## 2023-09-22 DIAGNOSIS — Z23 Encounter for immunization: Secondary | ICD-10-CM | POA: Diagnosis not present

## 2023-09-22 DIAGNOSIS — Z992 Dependence on renal dialysis: Secondary | ICD-10-CM | POA: Diagnosis not present

## 2023-09-22 DIAGNOSIS — D689 Coagulation defect, unspecified: Secondary | ICD-10-CM | POA: Diagnosis not present

## 2023-09-22 DIAGNOSIS — D631 Anemia in chronic kidney disease: Secondary | ICD-10-CM | POA: Diagnosis not present

## 2023-09-22 DIAGNOSIS — E211 Secondary hyperparathyroidism, not elsewhere classified: Secondary | ICD-10-CM | POA: Diagnosis not present

## 2023-09-22 DIAGNOSIS — D509 Iron deficiency anemia, unspecified: Secondary | ICD-10-CM | POA: Diagnosis not present

## 2023-09-25 DIAGNOSIS — D631 Anemia in chronic kidney disease: Secondary | ICD-10-CM | POA: Diagnosis not present

## 2023-09-25 DIAGNOSIS — E211 Secondary hyperparathyroidism, not elsewhere classified: Secondary | ICD-10-CM | POA: Diagnosis not present

## 2023-09-25 DIAGNOSIS — N186 End stage renal disease: Secondary | ICD-10-CM | POA: Diagnosis not present

## 2023-09-25 DIAGNOSIS — D509 Iron deficiency anemia, unspecified: Secondary | ICD-10-CM | POA: Diagnosis not present

## 2023-09-25 DIAGNOSIS — D689 Coagulation defect, unspecified: Secondary | ICD-10-CM | POA: Diagnosis not present

## 2023-09-25 DIAGNOSIS — Z992 Dependence on renal dialysis: Secondary | ICD-10-CM | POA: Diagnosis not present

## 2023-09-25 DIAGNOSIS — Z23 Encounter for immunization: Secondary | ICD-10-CM | POA: Diagnosis not present

## 2023-09-26 ENCOUNTER — Other Ambulatory Visit (HOSPITAL_COMMUNITY): Payer: Self-pay | Admitting: Nephrology

## 2023-09-26 DIAGNOSIS — N057 Unspecified nephritic syndrome with diffuse crescentic glomerulonephritis: Secondary | ICD-10-CM

## 2023-09-27 DIAGNOSIS — Z23 Encounter for immunization: Secondary | ICD-10-CM | POA: Diagnosis not present

## 2023-09-27 DIAGNOSIS — D689 Coagulation defect, unspecified: Secondary | ICD-10-CM | POA: Diagnosis not present

## 2023-09-27 DIAGNOSIS — D509 Iron deficiency anemia, unspecified: Secondary | ICD-10-CM | POA: Diagnosis not present

## 2023-09-27 DIAGNOSIS — E211 Secondary hyperparathyroidism, not elsewhere classified: Secondary | ICD-10-CM | POA: Diagnosis not present

## 2023-09-27 DIAGNOSIS — D631 Anemia in chronic kidney disease: Secondary | ICD-10-CM | POA: Diagnosis not present

## 2023-09-27 DIAGNOSIS — N186 End stage renal disease: Secondary | ICD-10-CM | POA: Diagnosis not present

## 2023-09-27 DIAGNOSIS — Z992 Dependence on renal dialysis: Secondary | ICD-10-CM | POA: Diagnosis not present

## 2023-09-28 ENCOUNTER — Ambulatory Visit (HOSPITAL_COMMUNITY)
Admission: RE | Admit: 2023-09-28 | Discharge: 2023-09-28 | Disposition: A | Discharge status: Home / Self Care | Source: Ambulatory Visit | Attending: Vascular Surgery | Admitting: Vascular Surgery

## 2023-09-28 ENCOUNTER — Ambulatory Visit (INDEPENDENT_AMBULATORY_CARE_PROVIDER_SITE_OTHER): Admitting: Physician Assistant

## 2023-09-28 VITALS — BP 175/65 | HR 74 | Temp 97.7°F | Wt 154.2 lb

## 2023-09-28 DIAGNOSIS — N186 End stage renal disease: Secondary | ICD-10-CM | POA: Insufficient documentation

## 2023-09-28 NOTE — Progress Notes (Signed)
 POST OPERATIVE OFFICE NOTE    CC:  F/u for surgery  HPI:  This is a 73 y.o. 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 Lanis.  He has a TDC that was placed by Dr. Lanis on 06/27/2023  Pt states he does  have pain/numbness in the left hand.   He did have pain prior to the surgery that is really not much different.  He does get intermittent numbness in the left hand that is new since the surgery.  He states it is tolerable.  It does not happen all the time.  Sometimes happen during dialysis other times not.    The pt is on dialysis M/W/F at [address] location.  He plans on doing home dialysis at the beginning of the year.     Allergies  Allergen Reactions   Pregabalin Other (See Comments)    Elevated LFTs   Ferrous Sulfate  Nausea And Vomiting, Nausea Only and Other (See Comments)    325 mg once a day caused EXTREME NAUSEA   Lisinopril Nausea Only   Merbromin Hives   Thimerosal (Thiomersal) Hives    Current Outpatient Medications  Medication Sig Dispense Refill   aspirin  EC 81 MG tablet Take 81 mg by mouth daily.     atorvastatin  (LIPITOR) 40 MG tablet Take 1 tablet (40 mg total) by mouth daily. 90 tablet 3   clindamycin (CLEOCIN T) 1 % external solution Apply 1 Application topically 2 (two) times daily as needed (for scalp irritation).     clobetasol cream (TEMOVATE) 0.05 % Apply 1 Application topically 2 (two) times daily as needed (for scalp irritation).     Continuous Glucose Sensor (FREESTYLE LIBRE 3 SENSOR) MISC 1 each by Does not apply route every 14 (fourteen) days. Please apply for 14 days and then switch to new sensor 2 each 11   cyanocobalamin  (VITAMIN B12) 1000 MCG tablet Take 1,000 mcg by mouth daily.     DULoxetine  (CYMBALTA ) 30 MG capsule Take 1 capsule (30 mg total) by mouth daily. 90 capsule 1   Fluticasone -Umeclidin-Vilant (TRELEGY ELLIPTA ) 100-62.5-25 MCG/ACT AEPB Inhale 1 puff into the lungs daily in the afternoon. 60 each 5   furosemide  (LASIX ) 40 MG  tablet Take 1 tablet (40 mg total) by mouth daily. 30 tablet 0   HYDROcodone -acetaminophen  (NORCO) 7.5-325 MG tablet Take 1 tablet by mouth every 4 (four) hours as needed for moderate pain (pain score 4-6) (cough). 40 tablet 0   levothyroxine  (SYNTHROID ) 175 MCG tablet Take 1 tablet (175 mcg total) by mouth daily before breakfast. 90 tablet 1   Melatonin 10 MG TABS Take 10 mg by mouth at bedtime.     midodrine  (PROAMATINE ) 10 MG tablet Take 10 mg by mouth every Monday, Wednesday, and Friday. Before dialysis     pantoprazole  (PROTONIX ) 40 MG tablet Take 1 tablet (40 mg total) by mouth daily. 30 tablet 1   No current facility-administered medications for this visit.     ROS:  See HPI  Physical Exam:  Today's Vitals   09/28/23 1410  BP: (!) 175/65  Pulse: 74  Temp: 97.7 F (36.5 C)  TempSrc: Temporal  Weight: 154 lb 3.2 oz (69.9 kg)  PainSc: 0-No pain   Body mass index is 23.45 kg/m.   Incision:  well healed Extremities:   There is a palpable left radial pulse.   Motor and sensory are in tact.   There is a thrill/bruit present.  Access is  easily  palpable   Dialysis Duplex on 09/28/2023: Diameter:  0.48-1.07cm Depth:  0.46-0.56cm Flow Volume:  865 ml/min   Assessment/Plan:  This is a 73 y.o. male who is s/p: Left 1st stage BVT 08/14/2023 by Dr. Lanis  -the pt does have evidence of mild steal in the left hand with intermittent numbness that is new.  He states this is tolerable and does not want to get rid of fistula.  He knows that if this gets worse or he loses function of his hand, we would need to ligate the fistula.  Discussed we could go in the right arm, but he would be high risk on that side as well.  Given that is his dominant arm, he does not want to do this.  -his fistula is maturing nicely.  Will set him up for left 2nd stage BVT with Dr. Lanis most likely on a Tuesday morning.  -if pt has tunneled catheter, this can be removed at the discretion of the dialysis  center once the pt's access has been successfully cannulated to their satisfaction.  -discussed with pt that access does not last forever and will need intervention or even new access at some point.     Lucie Apt, [facility] [phone]  Clinic MD:  Lanis

## 2023-09-29 ENCOUNTER — Other Ambulatory Visit (HOSPITAL_COMMUNITY): Payer: Self-pay

## 2023-09-29 DIAGNOSIS — E211 Secondary hyperparathyroidism, not elsewhere classified: Secondary | ICD-10-CM | POA: Diagnosis not present

## 2023-09-29 DIAGNOSIS — D631 Anemia in chronic kidney disease: Secondary | ICD-10-CM | POA: Diagnosis not present

## 2023-09-29 DIAGNOSIS — N186 End stage renal disease: Secondary | ICD-10-CM | POA: Diagnosis not present

## 2023-09-29 DIAGNOSIS — Z992 Dependence on renal dialysis: Secondary | ICD-10-CM | POA: Diagnosis not present

## 2023-09-29 DIAGNOSIS — D689 Coagulation defect, unspecified: Secondary | ICD-10-CM | POA: Diagnosis not present

## 2023-09-29 DIAGNOSIS — Z23 Encounter for immunization: Secondary | ICD-10-CM | POA: Diagnosis not present

## 2023-09-29 DIAGNOSIS — D509 Iron deficiency anemia, unspecified: Secondary | ICD-10-CM | POA: Diagnosis not present

## 2023-10-02 ENCOUNTER — Other Ambulatory Visit: Payer: Self-pay

## 2023-10-02 DIAGNOSIS — D689 Coagulation defect, unspecified: Secondary | ICD-10-CM | POA: Diagnosis not present

## 2023-10-02 DIAGNOSIS — D631 Anemia in chronic kidney disease: Secondary | ICD-10-CM | POA: Diagnosis not present

## 2023-10-02 DIAGNOSIS — D509 Iron deficiency anemia, unspecified: Secondary | ICD-10-CM | POA: Diagnosis not present

## 2023-10-02 DIAGNOSIS — Z23 Encounter for immunization: Secondary | ICD-10-CM | POA: Diagnosis not present

## 2023-10-02 DIAGNOSIS — N186 End stage renal disease: Secondary | ICD-10-CM

## 2023-10-02 DIAGNOSIS — Z992 Dependence on renal dialysis: Secondary | ICD-10-CM | POA: Diagnosis not present

## 2023-10-02 DIAGNOSIS — E211 Secondary hyperparathyroidism, not elsewhere classified: Secondary | ICD-10-CM | POA: Diagnosis not present

## 2023-10-03 ENCOUNTER — Telehealth: Payer: Self-pay

## 2023-10-03 DIAGNOSIS — N186 End stage renal disease: Secondary | ICD-10-CM | POA: Diagnosis not present

## 2023-10-03 DIAGNOSIS — D689 Coagulation defect, unspecified: Secondary | ICD-10-CM | POA: Diagnosis not present

## 2023-10-03 DIAGNOSIS — Z23 Encounter for immunization: Secondary | ICD-10-CM | POA: Diagnosis not present

## 2023-10-03 DIAGNOSIS — E211 Secondary hyperparathyroidism, not elsewhere classified: Secondary | ICD-10-CM | POA: Diagnosis not present

## 2023-10-03 DIAGNOSIS — D631 Anemia in chronic kidney disease: Secondary | ICD-10-CM | POA: Diagnosis not present

## 2023-10-03 DIAGNOSIS — Z992 Dependence on renal dialysis: Secondary | ICD-10-CM | POA: Diagnosis not present

## 2023-10-03 DIAGNOSIS — D509 Iron deficiency anemia, unspecified: Secondary | ICD-10-CM | POA: Diagnosis not present

## 2023-10-03 NOTE — Telephone Encounter (Signed)
 Copied from CRM #[id number]. Topic: Clinical - Medication Question >> Oct 03, 2023 12:14 PM Walter Wilkerson wrote: Reason for CRM: Patient called in stating that Dr. Antoniette prescribed him HYDROcodone -acetaminophen  (NORCO) 7.5-325 MG tablet a few weeks ago. Patient states that it's not working. He wants to know if she can prescribe him something stronger, if possible? Please give him a call back. CB #: [phone].

## 2023-10-04 DIAGNOSIS — N186 End stage renal disease: Secondary | ICD-10-CM | POA: Diagnosis not present

## 2023-10-04 DIAGNOSIS — Z992 Dependence on renal dialysis: Secondary | ICD-10-CM | POA: Diagnosis not present

## 2023-10-04 DIAGNOSIS — D509 Iron deficiency anemia, unspecified: Secondary | ICD-10-CM | POA: Diagnosis not present

## 2023-10-04 DIAGNOSIS — E211 Secondary hyperparathyroidism, not elsewhere classified: Secondary | ICD-10-CM | POA: Diagnosis not present

## 2023-10-04 DIAGNOSIS — Z23 Encounter for immunization: Secondary | ICD-10-CM | POA: Diagnosis not present

## 2023-10-04 DIAGNOSIS — D631 Anemia in chronic kidney disease: Secondary | ICD-10-CM | POA: Diagnosis not present

## 2023-10-04 DIAGNOSIS — D689 Coagulation defect, unspecified: Secondary | ICD-10-CM | POA: Diagnosis not present

## 2023-10-05 ENCOUNTER — Ambulatory Visit (INDEPENDENT_AMBULATORY_CARE_PROVIDER_SITE_OTHER): Admitting: Pulmonary Disease

## 2023-10-05 ENCOUNTER — Encounter: Payer: Self-pay | Admitting: Pulmonary Disease

## 2023-10-05 VITALS — BP 140/70 | HR 74 | Temp 98.2°F | Ht 68.0 in | Wt 153.0 lb

## 2023-10-05 DIAGNOSIS — J432 Centrilobular emphysema: Secondary | ICD-10-CM | POA: Diagnosis not present

## 2023-10-05 DIAGNOSIS — G4733 Obstructive sleep apnea (adult) (pediatric): Secondary | ICD-10-CM | POA: Diagnosis not present

## 2023-10-05 NOTE — Progress Notes (Signed)
 Walter Wilkerson    [id number]    [birth year]  Primary Care Physician:Breeback, Vermell CROME, PA-C  Referring Physician: Tonette Lauraine HERO, PA-C [address] [place],  [place] [address]  Chief complaint: Follow-up for emphysema, lung cancer, OSA  HPI: 73 y.o. 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   Discussed the use of AI scribe software for clinical note transcription with the patient, who gave verbal consent to proceed.  History of Present Illness Walter Wilkerson is a 73 year old male with lung cancer who presents with shortness of breath. He was referred by PA Lauraine Tonette for evaluation of his lung condition.  Dyspnea - Chronic shortness of breath since right lower lobectomy in 2008 for lung cancer - Dyspnea is exertional and resolves with rest  Obstructive sleep apnea - Diagnosed in 2024 - Uses auto set CPAP machine nightly with good tolerance - Overdue for new supplies including hosing, water well, and filters . 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  Decision made not to continue with Rituxan     Relevant Pulmonary history: Pets: 2 dogs Occupation: Used to work in Estate manager/land agent Exposures: No mold, hot tub, Financial controller.  No feather pillows or comforters No h/o chemo/XRT/amiodarone/macrodantin/MTX  No  exposure to asbestos, silica or other organic allergens  Smoking history: 30-pack-year smoker.  Quit in 2006 Travel history: Previously lived in [place]  and [place] .  No significant recent travel Family history: No family history of lung disease   Outpatient Encounter Medications as of 10/05/2023  Medication Sig   aspirin  EC 81 MG tablet Take 81 mg by mouth daily.   atorvastatin  (LIPITOR) 40 MG tablet Take 1 tablet (40 mg total) by mouth daily.   clindamycin (CLEOCIN T) 1 % external solution Apply 1 Application topically 2 (two) times daily as needed (for scalp irritation).   clobetasol cream (TEMOVATE) 0.05 % Apply 1 Application topically 2 (two) times daily as needed (for scalp irritation).   cyanocobalamin  (VITAMIN B12) 1000 MCG tablet Take 1,000 mcg by mouth daily.   DULoxetine  (CYMBALTA ) 30 MG capsule Take 1 capsule (30 mg total) by mouth daily.   Fluticasone -Umeclidin-Vilant (TRELEGY ELLIPTA ) 100-62.5-25 MCG/ACT AEPB Inhale 1 puff into the lungs daily in the afternoon.   furosemide  (LASIX ) 40 MG tablet Take 1 tablet (40 mg total) by mouth daily.   HYDROcodone -acetaminophen  (NORCO) 7.5-325 MG tablet Take 1 tablet by mouth every 4 (four) hours as needed for moderate pain (pain score 4-6) (cough).   levothyroxine  (SYNTHROID ) 175 MCG tablet Take 1 tablet (175 mcg total) by mouth daily before breakfast.   Melatonin 10 MG TABS Take 10 mg by mouth at bedtime.   pantoprazole  (PROTONIX ) 40 MG tablet Take 1 tablet (40 mg total) by mouth daily.   Continuous Glucose Sensor (FREESTYLE LIBRE 3 SENSOR) MISC 1 each by Does not apply route every 14 (fourteen) days. Please apply for 14 days and then switch to new sensor (Patient not taking: Reported on 10/05/2023)   midodrine  (PROAMATINE ) 10 MG  tablet Take 10 mg by mouth every Monday, Wednesday, and Friday. Before dialysis (Patient not taking: Reported on 10/05/2023)   No facility-administered encounter medications on file as of 10/05/2023.     Physical  Exam: Today's Vitals   10/05/23 1041  BP: (!) 140/70  Pulse: 74  Temp: 98.2 F (36.8 C)  TempSrc: Oral  SpO2: 97%  Weight: 153 lb (69.4 kg)  Height: 5' 8 (1.727 m)   Body mass index is 23.26 kg/m.  Physical Exam GEN: No acute distress CV: Regular rate and rhythm with a heart murmur present LUNGS: Clear to auscultation bilaterally with normal respiratory effort SKIN JOINTS: Warm and dry no rash    Data Reviewed: Imaging: CTA 06/29/2023-no pulmonary embolism, dense lower lobe consolidation with air bronchograms, minimal bilateral pleural fluid, extensive bilateral emphysema  PFTs: 05/05/2022 FVC 2.72 [69%], FEV1 1.99 [67%], F/F73, TLC 5.03 [95%], DLCO 15.78 [65%] Mild restriction, mild diffusion defect  Labs:  Sleep: Home sleep study 04/11/2022 severe OSA, AHI 36.6, low O2 sat of 76% CPAP download 10/04/2023-100% compliance, residual AHI 1.1 Assessment & Plan Emphysema with restrictive lung defect, post-lobectomy for lung cancer Emphysema with restrictive lung defect secondary to right lower lobectomy performed in 2008 for lung cancer. Lung function tests show some restriction but no significant obstruction, indicating emphysema rather than significant COPD. Lung capacity and diffusion capacity are reduced, likely due to the lobectomy. He is currently on Trelegy and reports doing well with it. Smoking history of at least a pack a day, quit in 2006. - Continue Trelegy. - Advise to contact the clinic if experiencing exacerbations.  Obstructive sleep apnea on CPAP therapy Obstructive sleep apnea diagnosed in 2024, currently managed with CPAP therapy. He reports good compliance with nightly use of the CPAP machine and is doing well on the current auto set CPAP settings [5-15 cm]. Requires new supplies for the CPAP machine, including hosing, water well, and filters. - Order CPAP supplies including hosing, water well, and filters. - Review CPAP download to document compliance. -  Schedule annual follow-up for sleep apnea management.  Recommendations: Continue Trelegy Continue CPAP for OSA.  Send order for renewal of CPAP supplies  Lonna Coder MD [facility] 10/05/2023, 11:14 AM  CC: Tonette Lauraine HERO, PA-C

## 2023-10-05 NOTE — Patient Instructions (Signed)
  VISIT SUMMARY: Today, you were seen for shortness of breath related to your history of lung cancer and emphysema. We also reviewed your obstructive sleep apnea management and your need for new CPAP supplies.  YOUR PLAN: EMPHYSEMA WITH RESTRICTIVE LUNG DEFECT, POST-LOBECTOMY: You have emphysema and a restrictive lung defect due to your previous lung surgery for cancer. Your lung function tests show some restriction but no significant obstruction. -Continue using your Trelegy inhaler as prescribed. -Contact the clinic if you experience any worsening of your symptoms.  OBSTRUCTIVE SLEEP APNEA ON CPAP THERAPY: You have obstructive sleep apnea, which is being managed with a CPAP machine. You are using the machine nightly and doing well with it. -We will order new supplies for your CPAP machine, including hosing, water well, and filters. -We will review your CPAP download to document your compliance. -Schedule your annual follow-up for sleep apnea management.

## 2023-10-05 NOTE — Telephone Encounter (Signed)
 Copied from CRM [id number]. Topic: Clinical - Medication Question >> Oct 05, 2023 12:43 PM Anairis L wrote: Reason for CRM: Patient is calling in regarding a msg he sent on 10/03/23 regarding his medication(Norco) . He has not received a call from anyone in the clinic and he would like to talk to [facility] Nurse/CMA.

## 2023-10-06 DIAGNOSIS — D689 Coagulation defect, unspecified: Secondary | ICD-10-CM | POA: Diagnosis not present

## 2023-10-06 DIAGNOSIS — D631 Anemia in chronic kidney disease: Secondary | ICD-10-CM | POA: Diagnosis not present

## 2023-10-06 DIAGNOSIS — N186 End stage renal disease: Secondary | ICD-10-CM | POA: Diagnosis not present

## 2023-10-06 DIAGNOSIS — Z992 Dependence on renal dialysis: Secondary | ICD-10-CM | POA: Diagnosis not present

## 2023-10-06 DIAGNOSIS — E211 Secondary hyperparathyroidism, not elsewhere classified: Secondary | ICD-10-CM | POA: Diagnosis not present

## 2023-10-06 DIAGNOSIS — Z23 Encounter for immunization: Secondary | ICD-10-CM | POA: Diagnosis not present

## 2023-10-06 DIAGNOSIS — D509 Iron deficiency anemia, unspecified: Secondary | ICD-10-CM | POA: Diagnosis not present

## 2023-10-09 ENCOUNTER — Encounter (HOSPITAL_COMMUNITY): Payer: Self-pay | Admitting: Vascular Surgery

## 2023-10-09 ENCOUNTER — Other Ambulatory Visit: Payer: Self-pay

## 2023-10-09 DIAGNOSIS — D509 Iron deficiency anemia, unspecified: Secondary | ICD-10-CM | POA: Diagnosis not present

## 2023-10-09 DIAGNOSIS — Z992 Dependence on renal dialysis: Secondary | ICD-10-CM | POA: Diagnosis not present

## 2023-10-09 DIAGNOSIS — N186 End stage renal disease: Secondary | ICD-10-CM | POA: Diagnosis not present

## 2023-10-09 DIAGNOSIS — D689 Coagulation defect, unspecified: Secondary | ICD-10-CM | POA: Diagnosis not present

## 2023-10-09 DIAGNOSIS — Z23 Encounter for immunization: Secondary | ICD-10-CM | POA: Diagnosis not present

## 2023-10-09 DIAGNOSIS — E211 Secondary hyperparathyroidism, not elsewhere classified: Secondary | ICD-10-CM | POA: Diagnosis not present

## 2023-10-09 DIAGNOSIS — D631 Anemia in chronic kidney disease: Secondary | ICD-10-CM | POA: Diagnosis not present

## 2023-10-09 MED ORDER — HYDROCODONE-ACETAMINOPHEN 10-325 MG PO TABS: ORAL_TABLET | ORAL | 0 refills | Status: DC

## 2023-10-09 NOTE — Anesthesia Preprocedure Evaluation (Signed)
 Anesthesia Evaluation  Patient identified by MRN, date of birth, ID band Patient awake    Reviewed: Allergy & Precautions, NPO status , Patient's Chart, lab work & pertinent test results, reviewed documented beta blocker date and time   History of Anesthesia Complications Negative for: history of anesthetic complications  Airway Mallampati: II  TM Distance: >3 FB     Dental no notable dental hx.    Pulmonary shortness of breath, sleep apnea , pneumonia, COPD,  COPD inhaler, former smoker   breath sounds clear to auscultation       Cardiovascular hypertension, + Valvular Problems/Murmurs  Rhythm:Regular Rate:Normal  IMPRESSIONS     1. Left ventricular ejection fraction, by estimation, is 65 to 70%. The  left ventricle has normal function. The left ventricle has no regional  wall motion abnormalities. There is mild left ventricular hypertrophy.  Left ventricular diastolic parameters  were normal.   2. Right ventricular systolic function is normal. The right ventricular  size is normal.   3. The mitral valve is normal in structure. No evidence of mitral valve  regurgitation.   4. The aortic valve is tricuspid. Aortic valve regurgitation is not  visualized.     Neuro/Psych neg Seizures    GI/Hepatic ,neg GERD  ,,(+) Hepatitis -  Endo/Other  Hypothyroidism    Renal/GU ESRF and DialysisRenal disease     Musculoskeletal   Abdominal   Peds  Hematology  (+) Blood dyscrasia, anemia   Anesthesia Other Findings   Reproductive/Obstetrics                              Anesthesia Physical Anesthesia Plan  ASA: 4  Anesthesia Plan: General   Post-op Pain Management:    Induction: Intravenous  PONV Risk Score and Plan: 2 and Ondansetron , Midazolam  and Treatment may vary due to age or medical condition  Airway Management Planned: LMA  Additional Equipment:   Intra-op Plan:    Post-operative Plan: Extubation in OR  Informed Consent: I have reviewed the patients History and Physical, chart, labs and discussed the procedure including the risks, benefits and alternatives for the proposed anesthesia with the patient or authorized representative who has indicated his/her understanding and acceptance.     Dental advisory given  Plan Discussed with: CRNA  Anesthesia Plan Comments: (PAT note written 10/09/2023 by Rylee Nuzum, PA-C.  )         Anesthesia Quick Evaluation

## 2023-10-09 NOTE — Progress Notes (Signed)
 Pt's wife aware of new arrival time 16.

## 2023-10-09 NOTE — Progress Notes (Signed)
 PCP - Vermell Bologna, PA-C Cardiologist - Dr Lamar Fitch Pulmonary - Dr Deward Coder  Chest x-ray - 07/03/23 EKG - 06/29/23 Stress Test - 09/08/21 ECHO - 04/28/23 Cardiac Cath - n/a  ICD Pacemaker/Loop - n/a  Sleep Study -  Yes (04/11/22) CPAP - uses CPAP nightly   Diabetes - n/a  Blood Thinner Instructions:  n/a  Aspirin  Instructions: Continue per MD  NPO  Anesthesia review: Yes  STOP now taking any Aspirin  (unless otherwise instructed by your surgeon), Aleve, Naproxen, Ibuprofen , Motrin , Advil , Goody's, BC's, all herbal medications, fish oil, and all vitamins.   Coronavirus Screening Do you have any of the following symptoms:  Cough yes/no: No Fever (>100.58F)  yes/no: No Runny nose yes/no: No Sore throat yes/no: No Difficulty breathing/shortness of breath  yes/no: No  Have you traveled in the last 14 days and where? yes/no: No  Patient verbalized understanding of instructions that were given via phone.

## 2023-10-09 NOTE — Progress Notes (Signed)
 Anesthesia Chart Review: Walter Wilkerson  Case: [id number] Date/Time: 10/10/23 9077   Procedure: TRANSPOSITION, VEIN, BASILIC (Left)   Anesthesia type: Monitor Anesthesia Care   Diagnosis: ESRD (end stage renal disease) (HCC) [N18.6]   Pre-op  diagnosis: ESRD   [facility] ROOM 12 / [facility]   Surgeons: Lanis Fonda BRAVO, MD       DISCUSSION: Is a 73 year old male scheduled for the above procedure.  He is s/p left brachiobasiliac AVF creation, first stage on 08/14/2023. S/p [facility] 06/27/2023.  As on 09/28/2023, he was doing HD MWF at [address] location with plans to start home dialysis next year.   History includes former smoker (quit 08/10/2004), HTN, murmur (not significant valvular disease 04/2023), ESRD (secondary to ANCA vasculitis; HD initiated 06/29/2023), OSA (uses CPAP), COPD, non-small cell lung cancer (s/p right lower and middle lobectomy in 03/2006 with adjuvant chemotherapy by Dr. Fannie at [facility]), dyspnea, hypothyroidism, neuropathy, drug-induced hyperglycemia, community acquired PNA (11/2022, 06/2023), osteoarthritis (right TKA 09/15/2021).   Last pulmonology follow-up was on 9/25/205 with Dr. Theophilus. He wrote, Emphysema with restrictive lung defect secondary to right lower lobectomy performed in 2008 for lung cancer. Lung function tests show some restriction but no significant obstruction, indicating emphysema rather than significant COPD. Lung capacity and diffusion capacity are reduced, likely due to the lobectomy. He is currently on Trelegy and reports doing well with it. Smoking history of at least a pack a day, quit in 2006.  Patient had good compliance with CPAP.  1 year follow-up planned.  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 He has received IV iron  (12/28/2022) and Aranesp  (per nephrology). Last PRBC 08/03/2023. HGB stable at last visit  at 8.9.   Nonischemic stress test 11/2022. Last TTE on 04/28/2023 showed LVEF 65 to 70%, no regional wall motion abnormalities, mild LVH, normal diastolic parameters, normal RV systolic function, trivial TR.  Anesthesia team to evaluate on the day of surgery.    VS:  Wt Readings from Last 3 Encounters:  10/05/23 69.4 kg  09/28/23 69.9 kg  09/19/23 69 kg   BP Readings from Last 3 Encounters:  10/05/23 (!) 140/70  09/28/23 (!) 175/65  09/19/23 (!) 135/59   Pulse Readings from Last 3 Encounters:  10/05/23 74  09/28/23 74  09/19/23 76    PROVIDERS: Antoniette Vermell CROME, PA-C is PCP  Mannam, Praveen, MD is pulmonologist [name], MD is HEM-ONC. Per notes, bone marrow biopsy on 12/09/2022 showed a hypercellular marrow with trilineage hematopoiesis.  There is no evidence of a hematologic malignancy.  There was some moderate poikilocytosis.  There were abundant megakaryocytes.  His cytogenetics were all normal and per 02/08/2023 note, NGS studies look okay. GLENWOOD Dennise Hoes, MD is nephrologist - Bernie Charleston, MD is cardiologist, although last seen by cardiology as inpatient by [facility] cardiologist during 12/2022 admission for dyspnea, acute respiratory failure with hypoxia, volume overload. He had a non-ischemic stress test and echo showed normal LVEF, trace TR, trivial pericardial effusion.    LABS: For day of surgery. Last labs in [facility] include: Lab Results  Component Value Date   WBC 8.0 09/19/2023   HGB 8.9 (L) 09/19/2023   HCT 28.0 (L) 09/19/2023   PLT 297 09/19/2023   GLUCOSE 91 08/29/2023   ALT 5 08/29/2023   AST 13 (L) 08/29/2023   NA 138 08/29/2023   K 4.0 08/29/2023   CL 95 (L)  08/29/2023   CREATININE 3.47 (H) 08/29/2023   BUN 13 08/29/2023   CO2 30 08/29/2023   INR 1.1 05/02/2023   HGBA1C 5.2 09/05/2023    OTHER: PFTs 05/05/2022: FVC 2.72 [69%], FEV1 1.99 [67%], F/F73, TLC 5.03 [95%], DLCO 15.78 [65%] Mild restriction, mild diffusion defect  Home Sleep  Study 04/11/2022: severe OSA, AHI 36.6, low O2 sat of 76% CPAP download 10/04/2023-100% compliance, residual AHI 1.1   IMAGES: CT Head 6/24/205: IMPRESSION: Normal brain.  CTA Chest & CT abd/pelvis 06/29/2023: IMPRESSION: 1. No pulmonary emboli. 2. Dense left lower lobe consolidation with air bronchograms, compatible with severe pneumonia or consolidative atelectasis. 3. Minimal bilateral pleural fluid with minimal diffuse bilateral ground glass opacity, compatible with pulmonary edema. 4. Stable mild cardiomegaly. 5. Prominent central pulmonary arteries, suggesting pulmonary arterial hypertension. 6. Anasarca with a small amount of ascites, increased perinephric fluid bilaterally and diffuse subcutaneous edema. 7.  Calcific coronary artery and aortic atherosclerosis. 8. Extensive bilateral emphysema. 9. Mildly enlarged prostate. - Aortic Atherosclerosis (ICD10-I70.0) and Emphysema (ICD10-J43.9).  CT Head/C-spine 06/29/2023: IMPRESSION: 1. No evidence of acute intracranial abnormality or cervical spine fracture. 2. Cervical disc and facet degeneration as above.    EKG: 06/29/2023: Sinus rhythm No significant change since last tracing Confirmed by Ellouise Fine ([id number]) on 06/29/2023 1:47:56 PM   CV: Echo 04/28/2023: Impressions:  1. Left ventricular ejection fraction, by estimation, is 65 to 70%. The  left ventricle has normal function. The left ventricle has no regional  wall motion abnormalities. There is mild left ventricular hypertrophy.  Left ventricular diastolic parameters  were normal.   2. Right ventricular systolic function is normal. The right ventricular  size is normal.   3. The mitral valve is normal in structure. No evidence of mitral valve  regurgitation.   4. The aortic valve is tricuspid. Aortic valve regurgitation is not  visualized.    Nuclear stress test 12/15/2022 ([facility]): IMPRESSION:  No evidence of inducible ischemia.   US  Abd Aorta  05/06/2022: Summary:  Abdominal Aorta: No evidence of an abdominal aortic aneurysm was  visualized. The largest aortic measurement is 2.4 cm. Near ectasia of the  right mid common iliac artery, measuring 1.5 cm. No previous exam  available for comparison.    Long-term event monitor 04/20/2022 - 05/04/2022: Patient had a min HR of 59 bpm, max HR of 179 bpm, and avg HR of 81 bpm. Predominant underlying rhythm was Sinus Rhythm. 12 Supraventricular Tachycardia runs occurred, the run with the fastest interval lasting 6 beats with a max rate of 179 bpm, the  longest lasting 13 beats with an avg rate of 137 bpm. Some episodes of Supraventricular Tachycardia may be possible Atrial Tachycardia with variable block. Isolated SVEs were rare (<1.0%), SVE Couplets were rare (<1.0%), and SVE Triplets were rare  (<1.0%). Isolated VEs were rare (<1.0%), VE Couplets were rare (<1.0%), and no VE Triplets were present. Ventricular Trigeminy was present. Summary and conclusions: 12 episode of supraventricular tachycardia longest episode 13 beats at a rate of 137  Nuclear stress test 09/08/2021:   The study is normal. The study is low risk.   No ST deviation was noted.   LV perfusion is normal. There is no evidence of ischemia. There is no evidence of infarction.   Left ventricular function is normal. Nuclear stress EF: 69 %. The left ventricular ejection fraction is hyperdynamic (>65%). End diastolic cavity size is normal. End systolic cavity size is normal.  Past Medical History:  Diagnosis Date  AKI (acute kidney injury) 04/27/2023   Bilateral lower extremity edema 04/21/2023   Community acquired pneumonia of left upper lobe of lung 11/21/2022   COPD (chronic obstructive pulmonary disease) (HCC)    Drug-induced hyperglycemia 05/19/2023   Elevated d-dimer 11/22/2022   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   ESRD (end stage renal disease) on dialysis (HCC)    Mon Wed Fri   Heart murmur    Hepatitis     Hypertension    Hypothyroidism    Lung cancer (HCC)    Neuropathy    Sleep apnea    uses CPAP nightly - auto set   Thyroid  disease     Past Surgical History:  Procedure Laterality Date   ANKLE FRACTURE SURGERY Right    AV FISTULA PLACEMENT Left 08/14/2023   Procedure: ARTERIOVENOUS (AV) FISTULA CREATION;  Surgeon: Lanis Fonda BRAVO, MD;  Location: [facility] OR;  Service: Vascular;  Laterality: Left;   COLONOSCOPY  05/2020   in [facility]   INSERTION OF DIALYSIS CATHETER N/A 06/27/2023   Procedure: INSERTION OF DIALYSIS CATHETER;  Surgeon: Lanis Fonda BRAVO, MD;  Location: [facility] OR;  Service: Vascular;  Laterality: N/A;   LUNG LOBECTOMY Right 2008   RLL resection   R knee arthroscopy     REPLACEMENT TOTAL KNEE Right 09/15/2021   Dr. Norleen Gavel   UPPER GI ENDOSCOPY  08/2022   in [facility]   VASECTOMY      MEDICATIONS: No current facility-administered medications for this encounter.    albuterol  (VENTOLIN  HFA) 108 (90 Base) MCG/ACT inhaler   aspirin  EC 81 MG tablet   atorvastatin  (LIPITOR) 40 MG tablet   clindamycin (CLEOCIN T) 1 % external solution   clobetasol cream (TEMOVATE) 0.05 %   cyanocobalamin  (VITAMIN B12) 1000 MCG tablet   DULoxetine  (CYMBALTA ) 30 MG capsule   Fluticasone -Umeclidin-Vilant (TRELEGY ELLIPTA ) 100-62.5-25 MCG/ACT AEPB   furosemide  (LASIX ) 40 MG tablet   levothyroxine  (SYNTHROID ) 150 MCG tablet   Melatonin 10 MG TABS   NON FORMULARY   pantoprazole  (PROTONIX ) 40 MG tablet   PREVIDENT 0.2 % SOLN   zinc gluconate 50 MG tablet   Continuous Glucose Sensor (FREESTYLE LIBRE 3 SENSOR) MISC   HYDROcodone -acetaminophen  (NORCO) 7.5-325 MG tablet   levothyroxine  (SYNTHROID ) 175 MCG tablet   sevelamer carbonate (RENVELA) 800 MG tablet    Walter Ruder, PA-C Surgical Short Stay/Anesthesiology [facility] Phone [phone] [facility] Phone [phone] 10/09/2023 1:44 PM

## 2023-10-10 ENCOUNTER — Ambulatory Visit (HOSPITAL_COMMUNITY): Admitting: Vascular Surgery

## 2023-10-10 ENCOUNTER — Ambulatory Visit (HOSPITAL_COMMUNITY)
Admission: RE | Admit: 2023-10-10 | Discharge: 2023-10-10 | Disposition: A | Discharge status: Home / Self Care | Attending: Vascular Surgery | Admitting: Vascular Surgery

## 2023-10-10 ENCOUNTER — Other Ambulatory Visit: Payer: Self-pay

## 2023-10-10 ENCOUNTER — Encounter (HOSPITAL_COMMUNITY)
Admission: RE | Disposition: A | Discharge status: Home / Self Care | Payer: Self-pay | Source: Home / Self Care | Attending: Vascular Surgery

## 2023-10-10 ENCOUNTER — Encounter (HOSPITAL_COMMUNITY): Payer: Self-pay | Admitting: Vascular Surgery

## 2023-10-10 DIAGNOSIS — I700 Atherosclerosis of aorta: Secondary | ICD-10-CM | POA: Diagnosis not present

## 2023-10-10 DIAGNOSIS — I1311 Hypertensive heart and chronic kidney disease without heart failure, with stage 5 chronic kidney disease, or end stage renal disease: Secondary | ICD-10-CM | POA: Diagnosis not present

## 2023-10-10 DIAGNOSIS — R0602 Shortness of breath: Secondary | ICD-10-CM | POA: Diagnosis not present

## 2023-10-10 DIAGNOSIS — J449 Chronic obstructive pulmonary disease, unspecified: Secondary | ICD-10-CM | POA: Diagnosis not present

## 2023-10-10 DIAGNOSIS — I120 Hypertensive chronic kidney disease with stage 5 chronic kidney disease or end stage renal disease: Secondary | ICD-10-CM

## 2023-10-10 DIAGNOSIS — E039 Hypothyroidism, unspecified: Secondary | ICD-10-CM | POA: Insufficient documentation

## 2023-10-10 DIAGNOSIS — N186 End stage renal disease: Secondary | ICD-10-CM | POA: Insufficient documentation

## 2023-10-10 DIAGNOSIS — Z992 Dependence on renal dialysis: Secondary | ICD-10-CM | POA: Insufficient documentation

## 2023-10-10 DIAGNOSIS — J439 Emphysema, unspecified: Secondary | ICD-10-CM | POA: Insufficient documentation

## 2023-10-10 DIAGNOSIS — D631 Anemia in chronic kidney disease: Secondary | ICD-10-CM | POA: Diagnosis not present

## 2023-10-10 DIAGNOSIS — Z87891 Personal history of nicotine dependence: Secondary | ICD-10-CM | POA: Insufficient documentation

## 2023-10-10 DIAGNOSIS — T82898A Other specified complication of vascular prosthetic devices, implants and grafts, initial encounter: Secondary | ICD-10-CM | POA: Insufficient documentation

## 2023-10-10 DIAGNOSIS — G4730 Sleep apnea, unspecified: Secondary | ICD-10-CM | POA: Diagnosis not present

## 2023-10-10 DIAGNOSIS — Z85118 Personal history of other malignant neoplasm of bronchus and lung: Secondary | ICD-10-CM | POA: Insufficient documentation

## 2023-10-10 DIAGNOSIS — R188 Other ascites: Secondary | ICD-10-CM | POA: Insufficient documentation

## 2023-10-10 DIAGNOSIS — X58XXXA Exposure to other specified factors, initial encounter: Secondary | ICD-10-CM | POA: Insufficient documentation

## 2023-10-10 DIAGNOSIS — N037 Chronic nephritic syndrome with diffuse crescentic glomerulonephritis: Secondary | ICD-10-CM | POA: Diagnosis not present

## 2023-10-10 HISTORY — DX: End stage renal disease: N18.6

## 2023-10-10 HISTORY — PX: BASCILIC VEIN TRANSPOSITION: SHX5742

## 2023-10-10 LAB — POCT I-STAT, CHEM 8
BUN: 18 mg/dL (ref 8–23)
Calcium, Ion: 1.12 mmol/L — ABNORMAL LOW (ref 1.15–1.40)
Chloride: 97 mmol/L — ABNORMAL LOW (ref 98–111)
Creatinine, Ser: 4.1 mg/dL — ABNORMAL HIGH (ref 0.61–1.24)
Glucose, Bld: 85 mg/dL (ref 70–99)
HCT: 31 % — ABNORMAL LOW (ref 39.0–52.0)
Hemoglobin: 10.5 g/dL — ABNORMAL LOW (ref 13.0–17.0)
Potassium: 3.9 mmol/L (ref 3.5–5.1)
Sodium: 134 mmol/L — ABNORMAL LOW (ref 135–145)
TCO2: 26 mmol/L (ref 22–32)

## 2023-10-10 SURGERY — TRANSPOSITION, VEIN, BASILIC
Anesthesia: General | Site: Arm Upper | Laterality: Left

## 2023-10-10 MED ORDER — HEPARIN 6000 UNIT IRRIGATION SOLUTION
Status: DC | PRN
  Administered 2023-10-10: 1

## 2023-10-10 MED ORDER — ONDANSETRON HCL 4 MG/2ML IJ SOLN
INTRAMUSCULAR | Status: DC | PRN
  Administered 2023-10-10: 4 mg via INTRAVENOUS

## 2023-10-10 MED ORDER — SODIUM CHLORIDE 0.9 % IV SOLN: 12.5000 mg | INTRAVENOUS | Status: DC | PRN

## 2023-10-10 MED ORDER — CHLORHEXIDINE GLUCONATE 4 % EX SOLN: 60.0000 mL | Freq: Once | CUTANEOUS | Status: DC

## 2023-10-10 MED ORDER — SODIUM CHLORIDE 0.9 % IV SOLN: INTRAVENOUS | Status: DC

## 2023-10-10 MED ORDER — 0.9 % SODIUM CHLORIDE (POUR BTL) OPTIME
TOPICAL | Status: DC | PRN
  Administered 2023-10-10: 1000 mL

## 2023-10-10 MED ORDER — PROTAMINE SULFATE 10 MG/ML IV SOLN
INTRAVENOUS | Status: DC | PRN
  Administered 2023-10-10: 20 mg via INTRAVENOUS

## 2023-10-10 MED ORDER — FENTANYL CITRATE (PF) 250 MCG/5ML IJ SOLN
INTRAMUSCULAR | Status: AC
  Filled 2023-10-10: qty 5

## 2023-10-10 MED ORDER — LIDOCAINE-EPINEPHRINE (PF) 1 %-1:200000 IJ SOLN
INTRAMUSCULAR | Status: AC
  Filled 2023-10-10: qty 30

## 2023-10-10 MED ORDER — HEMOSTATIC AGENTS (NO CHARGE) OPTIME
TOPICAL | Status: DC | PRN
  Administered 2023-10-10: 1 via TOPICAL

## 2023-10-10 MED ORDER — PHENYLEPHRINE HCL-NACL 20-0.9 MG/250ML-% IV SOLN
INTRAVENOUS | Status: AC
  Filled 2023-10-10: qty 500

## 2023-10-10 MED ORDER — HYDROMORPHONE HCL 1 MG/ML IJ SOLN
0.2500 mg | INTRAMUSCULAR | Status: DC | PRN
  Administered 2023-10-10: 0.25 mg via INTRAVENOUS
  Administered 2023-10-10: 0.5 mg via INTRAVENOUS

## 2023-10-10 MED ORDER — CHLORHEXIDINE GLUCONATE 0.12 % MT SOLN
15.0000 mL | Freq: Once | OROMUCOSAL | Status: AC
  Administered 2023-10-10: 15 mL via OROMUCOSAL
  Filled 2023-10-10: qty 15

## 2023-10-10 MED ORDER — HYDROMORPHONE HCL 1 MG/ML IJ SOLN
INTRAMUSCULAR | Status: AC
  Filled 2023-10-10: qty 1

## 2023-10-10 MED ORDER — FENTANYL CITRATE (PF) 100 MCG/2ML IJ SOLN: INTRAMUSCULAR | Status: DC | PRN

## 2023-10-10 MED ORDER — CEFAZOLIN SODIUM-DEXTROSE 2-4 GM/100ML-% IV SOLN
2.0000 g | INTRAVENOUS | Status: AC
  Administered 2023-10-10: 2 g via INTRAVENOUS
  Filled 2023-10-10: qty 100

## 2023-10-10 MED ORDER — PHENYLEPHRINE HCL-NACL 20-0.9 MG/250ML-% IV SOLN
INTRAVENOUS | Status: DC | PRN
  Administered 2023-10-10: 40 ug via INTRAVENOUS
  Administered 2023-10-10: 30 ug/min via INTRAVENOUS

## 2023-10-10 MED ORDER — HEPARIN 6000 UNIT IRRIGATION SOLUTION
Status: AC
  Filled 2023-10-10: qty 500

## 2023-10-10 MED ORDER — OXYCODONE HCL 5 MG/5ML PO SOLN: 5.0000 mg | Freq: Once | ORAL | Status: DC | PRN

## 2023-10-10 MED ORDER — DEXAMETHASONE SODIUM PHOSPHATE 10 MG/ML IJ SOLN
INTRAMUSCULAR | Status: DC | PRN
  Administered 2023-10-10: 10 mg via INTRAVENOUS

## 2023-10-10 MED ORDER — FENTANYL CITRATE (PF) 250 MCG/5ML IJ SOLN
INTRAMUSCULAR | Status: DC | PRN
  Administered 2023-10-10: 50 ug via INTRAVENOUS
  Administered 2023-10-10: 100 ug via INTRAVENOUS

## 2023-10-10 MED ORDER — LIDOCAINE 2% (20 MG/ML) 5 ML SYRINGE
INTRAMUSCULAR | Status: DC | PRN
  Administered 2023-10-10: 60 mg via INTRAVENOUS

## 2023-10-10 MED ORDER — PROPOFOL 500 MG/50ML IV EMUL
INTRAVENOUS | Status: DC | PRN
  Administered 2023-10-10: 125 ug/kg/min via INTRAVENOUS
  Administered 2023-10-10: 200 mg via INTRAVENOUS

## 2023-10-10 MED ORDER — ORAL CARE MOUTH RINSE: 15.0000 mL | Freq: Once | OROMUCOSAL | Status: AC

## 2023-10-10 MED ORDER — AMISULPRIDE (ANTIEMETIC) 5 MG/2ML IV SOLN: 10.0000 mg | Freq: Once | INTRAVENOUS | Status: DC | PRN

## 2023-10-10 MED ORDER — OXYCODONE HCL 5 MG PO TABS: 5.0000 mg | ORAL_TABLET | Freq: Once | ORAL | Status: DC | PRN

## 2023-10-10 MED ORDER — PROPOFOL 10 MG/ML IV BOLUS
INTRAVENOUS | Status: AC
  Filled 2023-10-10: qty 20

## 2023-10-10 MED ORDER — HEPARIN SODIUM (PORCINE) 1000 UNIT/ML IJ SOLN
INTRAMUSCULAR | Status: DC | PRN
Start: 2023-10-10 — End: 2023-10-10
  Administered 2023-10-10: 3000 [IU] via INTRAVENOUS

## 2023-10-10 MED ORDER — LIDOCAINE-EPINEPHRINE (PF) 1 %-1:200000 IJ SOLN
INTRAMUSCULAR | Status: DC | PRN
  Administered 2023-10-10: 4 mL

## 2023-10-10 SURGICAL SUPPLY — 36 items
ARMBAND PINK RESTRICT EXTREMIT (MISCELLANEOUS) ×1 IMPLANT
BAG COUNTER SPONGE SURGICOUNT (BAG) ×1 IMPLANT
BNDG ELASTIC 4X5.8 VLCR STR LF (GAUZE/BANDAGES/DRESSINGS) ×1 IMPLANT
CANISTER SUCTION 3000ML PPV (SUCTIONS) ×1 IMPLANT
CLIP TI MEDIUM 24 (CLIP) ×1 IMPLANT
CLIP TI MEDIUM 6 (CLIP) ×1 IMPLANT
CLIP TI WIDE RED SMALL 24 (CLIP) ×1 IMPLANT
CLIP TI WIDE RED SMALL 6 (CLIP) ×1 IMPLANT
COVER PROBE W GEL 5X96 (DRAPES) ×1 IMPLANT
DERMABOND ADVANCED .7 DNX12 (GAUZE/BANDAGES/DRESSINGS) ×1 IMPLANT
ELECTRODE REM PT RTRN 9FT ADLT (ELECTROSURGICAL) ×1 IMPLANT
GLOVE BIOGEL PI IND STRL 8 (GLOVE) ×1 IMPLANT
GOWN STRL REUS W/ TWL LRG LVL3 (GOWN DISPOSABLE) ×2 IMPLANT
GOWN STRL REUS W/TWL 2XL LVL3 (GOWN DISPOSABLE) ×2 IMPLANT
HEMOSTAT SNOW SURGICEL 2X4 (HEMOSTASIS) IMPLANT
KIT BASIN OR (CUSTOM PROCEDURE TRAY) ×1 IMPLANT
KIT TURNOVER KIT B (KITS) ×1 IMPLANT
NDL HYPO 25GX1X1/2 BEV (NEEDLE) ×1 IMPLANT
NEEDLE HYPO 25GX1X1/2 BEV (NEEDLE) ×1 IMPLANT
PACK CV ACCESS (CUSTOM PROCEDURE TRAY) ×1 IMPLANT
PAD ARMBOARD POSITIONER FOAM (MISCELLANEOUS) ×2 IMPLANT
SLING ARM FOAM STRAP LRG (SOFTGOODS) IMPLANT
SOLN 0.9% NACL 1000 ML (IV SOLUTION) ×1 IMPLANT
SOLN 0.9% NACL POUR BTL 1000ML (IV SOLUTION) ×1 IMPLANT
SOLN STERILE WATER 1000 ML (IV SOLUTION) ×1 IMPLANT
SOLN STERILE WATER BTL 1000 ML (IV SOLUTION) ×1 IMPLANT
SPIKE FLUID TRANSFER (MISCELLANEOUS) ×1 IMPLANT
SUT MNCRL AB 4-0 PS2 18 (SUTURE) ×1 IMPLANT
SUT PROLENE 6 0 BV (SUTURE) ×1 IMPLANT
SUT PROLENE 7 0 BV 1 (SUTURE) IMPLANT
SUT SILK 2 0 SH (SUTURE) IMPLANT
SUT SILK 2 0 SH CR/8 (SUTURE) ×1 IMPLANT
SUT VIC AB 2-0 CT1 TAPERPNT 27 (SUTURE) ×1 IMPLANT
SUT VIC AB 3-0 SH 27X BRD (SUTURE) ×2 IMPLANT
TOWEL GREEN STERILE (TOWEL DISPOSABLE) ×1 IMPLANT
UNDERPAD 30X36 HEAVY ABSORB (UNDERPADS AND DIAPERS) ×1 IMPLANT

## 2023-10-10 NOTE — Anesthesia Procedure Notes (Addendum)
 Procedure Name: LMA Insertion Date/Time: 10/10/2023 7:49 AM  Performed by: Vanice Search, RNPre-anesthesia Checklist: Patient identified, Emergency Drugs available, Suction available and Patient being monitored Patient Re-evaluated:Patient Re-evaluated prior to induction Oxygen Delivery Method: Circle System Utilized Preoxygenation: Pre-oxygenation with 100% oxygen Induction Type: IV induction Ventilation: Mask ventilation without difficulty LMA: LMA inserted LMA Size: 5.0 Number of attempts: 1 Placement Confirmation: positive ETCO2 Tube secured with: Tape Dental Injury: Teeth and Oropharynx as per pre-operative assessment

## 2023-10-10 NOTE — Transfer of Care (Signed)
 Immediate Anesthesia Transfer of Care Note  Patient: Walter Wilkerson  Procedure(s) Performed: TRANSPOSITION, VEIN, BASILIC (Left: Arm Upper)  Patient Location: PACU  Anesthesia Type:General  Level of Consciousness: awake, alert , and oriented  Airway & Oxygen Therapy: Patient Spontanous Breathing and Patient connected to face mask oxygen  Post-op Assessment: Report given to RN, Post -op Vital signs reviewed and stable, and Patient moving all extremities X 4  Post vital signs: Reviewed and stable  Last Vitals:  Vitals Value Taken Time  BP 121/63 10/10/23 09:13  Temp    Pulse 66 10/10/23 09:14  Resp 14 10/10/23 09:14  SpO2 97 % 10/10/23 09:14  Vitals shown include unfiled device data.  Last Pain:  Vitals:   10/10/23 0605  TempSrc:   PainSc: 5       Patients Stated Pain Goal: 2 (10/10/23 9394)  Complications: No notable events documented.

## 2023-10-10 NOTE — H&P (Signed)
 POST OPERATIVE OFFICE NOTE   Patient seen and examined in preop holding.  No complaints. No changes to medication history or physical exam since last seen in clinic. After discussing the risks and benefits of left arm fistula transposition, Walter Wilkerson elected to proceed.   Walter FORBES Rim MD  CC:  F/u for surgery  HPI:  This is a 73 y.o. 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 Rim.  He has a TDC that was placed by Dr. Rim on 06/27/2023  Pt states he does  have pain/numbness in the left hand.   He did have pain prior to the surgery that is really not much different.  He does get intermittent numbness in the left hand that is new since the surgery.  He states it is tolerable.  It does not happen all the time.  Sometimes happen during dialysis other times not.    The pt is on dialysis M/W/F at [facility] [address] location.  He plans on doing home dialysis at the beginning of the year.     Allergies  Allergen Reactions   Pregabalin Other (See Comments)    Elevated LFTs   Ferrous Sulfate  Nausea And Vomiting, Nausea Only and Other (See Comments)    325 mg once a day caused EXTREME NAUSEA   Lisinopril Nausea Only   Merbromin Hives   Thimerosal (Thiomersal) Hives    Current Facility-Administered Medications  Medication Dose Route Frequency Provider Last Rate Last Admin   0.9 %  sodium chloride  infusion   Intravenous Continuous Jachai Okazaki E, MD       0.9 %  sodium chloride  infusion   Intravenous Continuous Cleotilde Butler Dade, MD       ceFAZolin  (ANCEF ) IVPB 2g/100 mL premix  2 g Intravenous 30 min Pre-Op  Sire Poet E, MD       chlorhexidine  (HIBICLENS ) 4 % liquid 4 Application  60 mL Topical Once Doreatha Offer E, MD       And   chlorhexidine  (HIBICLENS ) 4 % liquid 4 Application  60 mL Topical Once Imojean Yoshino E, MD         ROS:  See HPI  Physical Exam:  Today's Vitals   10/10/23 0553 10/10/23 0605  BP: 129/77   Pulse: 74   Temp: 98.4 F (36.9 C)    TempSrc: Oral   SpO2: 95%   Weight: 68.5 kg   Height: 5' 8 (1.727 m)   PainSc:  5    Body mass index is 22.96 kg/m.   Incision:  well healed Extremities:   There is a palpable left radial pulse.   Motor and sensory are in tact.   There is a thrill/bruit present.  Access is  easily palpable   Dialysis Duplex on 09/28/2023: Diameter:  0.48-1.07cm Depth:  0.46-0.56cm Flow Volume:  865 ml/min   Assessment/Plan:  This is a 73 y.o. male who is s/p: Left 1st stage BVT 08/14/2023 by Dr. Rim  -the pt does have evidence of mild steal in the left hand with intermittent numbness that is new.  He states this is tolerable and does not want to get rid of fistula.  He knows that if this gets worse or he loses function of his hand, we would need to ligate the fistula.  Discussed we could go in the right arm, but he would be high risk on that side as well.  Given that is his dominant arm, he does not want to do this.  -  his fistula is maturing nicely.  Will set him up for left 2nd stage BVT with Dr. Lanis most likely on a Tuesday morning.  -if pt has tunneled catheter, this can be removed at the discretion of the dialysis center once the pt's access has been successfully cannulated to their satisfaction.  -discussed with pt that access does not last forever and will need intervention or even new access at some point.     Lucie Apt, [facility] [phone]  Clinic MD:  Lanis

## 2023-10-10 NOTE — Discharge Instructions (Signed)
   [facility]  Discharge Instructions  AV Fistula or Graft Surgery for Dialysis Access  Please refer to the following instructions for your post-procedure care. Your surgeon or physician assistant will discuss any changes with you.  Activity  You may drive the day following your surgery, if you are comfortable and no longer taking prescription pain medication. Resume full activity as the soreness in your incision resolves.  Bathing/Showering  You may shower after you go home. Keep your incision dry for 48 hours. Do not soak in a bathtub, hot tub, or swim until the incision heals completely. You may not shower if you have a hemodialysis catheter.  Incision Care  Clean your incision with mild soap and water after 48 hours. Pat the area dry with a clean towel. You do not need a bandage unless otherwise instructed. Do not apply any ointments or creams to your incision. You may have skin glue on your incision. Do not peel it off. It will come off on its own in about one week. Your arm may swell a bit after surgery. To reduce swelling use pillows to elevate your arm so it is above your heart. Your doctor will tell you if you need to lightly wrap your arm with an ACE bandage.  Diet  Resume your normal diet. There are not special food restrictions following this procedure. In order to heal from your surgery, it is CRITICAL to get adequate nutrition. Your body requires vitamins, minerals, and protein. Vegetables are the best source of vitamins and minerals. Vegetables also provide the perfect balance of protein. Processed food has little nutritional value, so try to avoid this.  Medications  Resume taking all of your medications. If your incision is causing pain, you may take over-the counter pain relievers such as acetaminophen  (Tylenol ). If you were prescribed a stronger pain medication, please be aware these medications can cause nausea and constipation. Prevent  nausea by taking the medication with a snack or meal. Avoid constipation by drinking plenty of fluids and eating foods with high amount of fiber, such as fruits, vegetables, and grains.  Do not take Tylenol  if you are taking prescription pain medications.  Follow up Your surgeon may want to see you in the office following your access surgery. If so, this will be arranged at the time of your surgery.  Please call us  immediately for any of the following conditions:  Increased pain, redness, drainage (pus) from your incision site Fever of 101 degrees or higher Severe or worsening pain at your incision site Hand pain or numbness.  Reduce your risk of vascular disease:  Stop smoking. If you would like help, call [facility] at 1-800-QUIT-NOW (718 363 3358) or  at (443)002-9735  Manage your cholesterol Maintain a desired weight Control your diabetes Keep your blood pressure down  Dialysis  It will take several weeks to several months for your new dialysis access to be ready for use. Your surgeon will determine when it is okay to use it. Your nephrologist will continue to direct your dialysis. You can continue to use your Permcath until your new access is ready for use.   10/10/2023 Andyn Sales [id number] 1950/10/24  Surgeon(s): Lanis Fonda BRAVO, MD  Procedure(s): TRANSPOSITION, VEIN, BASILIC   May stick graft immediately   May stick graft on designated area only:   X Do not stick left AV fistula for 6 weeks    If you have any questions, please call the office at [phone].

## 2023-10-10 NOTE — Op Note (Signed)
    NAME: Walter Wilkerson    MRN: [id number] DOB: 06/23/1950    DATE OF OPERATION: 10/10/2023  PREOP DIAGNOSIS:    End stage renal disease requiring dialysis  POSTOP DIAGNOSIS:    Same  PROCEDURE:    Left arm brachiobasilic fistula revision, transposition.   SURGEON: Fonda FORBES Rim  ASSIST: Curry Damme PA  ANESTHESIA: General   EBL: 15ml  INDICATIONS:    Walter Wilkerson is a 73 y.o. male with end stage renal disease and prior history of left arm brachiobasilic fistula creation. The fistula is now sizeable,and needs superficialization. After discussion the risks and benefits of the above, [name] elected to proceed.   FINDINGS:    6mm brachiobasilic fistula with multiple branches.   TECHNIQUE:   Patient was brought to the OR and laid in supine position.  Moderate anesthesia was induced. The patient was prepped and draped in standard fashion.  Lidocaine  was brought to the field and a local block was performed.   The case began with left arm ultrasound fistula mapping.  Multiple branches were noted and marked.  Three longitudinal skip incisions were made along the course of the basilic vein with 5 cm bridges.  This was carried through the subcutaneous fat to the brachiobasilic fistula.  The fistula was mobilized and multiple branches ligated using 2-0 silk and clips.  Once mobilized, a gore tunneler was brought on to the field, and a subcutaneous tunnel was made along the medial aspect of the bicep. Next, the patient as heparinized, fistula marked, clamped and transected. The vein was pulled through the tunnel tract and reanastomosed using 6.0 prolene suture in running fashion. The wound bed was irrigated with saline, hemostasis achieved with suture and cautery. The wounds were closed with layers of vicryl suture with monocryl and dermabond at the skin. There was a palpable thrill in the fistula at case completion with excellent pulse in the wrist.    Given the complexity of the case,   the assistant was necessary in order to expedient the procedure and safely perform the technical aspects of the operation.  The assistant provided traction and countertraction to assist with exposure of the artery and vein.  They also assisted with suture ligation of multiple venous branches.  They played a critical role in the anastomosis. These skills, especially following the Prolene suture for the anastomosis, could not have been adequately performed by a scrub tech assistant.   Fonda FORBES Rim, MD [facility] DATE OF DICTATION:   10/10/2023

## 2023-10-10 NOTE — Anesthesia Postprocedure Evaluation (Signed)
 Anesthesia Post Note  Patient: Walter Wilkerson  Procedure(s) Performed: TRANSPOSITION, VEIN, BASILIC (Left: Arm Upper)     Patient location during evaluation: PACU Anesthesia Type: General Level of consciousness: awake and alert Pain management: pain level controlled Vital Signs Assessment: post-procedure vital signs reviewed and stable Respiratory status: spontaneous breathing, nonlabored ventilation and respiratory function stable Cardiovascular status: blood pressure returned to baseline and stable Postop Assessment: no apparent nausea or vomiting Anesthetic complications: no   No notable events documented.  Last Vitals:  Vitals:   10/10/23 0945 10/10/23 0956  BP: 139/74 137/70  Pulse: 67 67  Resp: 17 18  Temp:  36.6 C  SpO2: 96% 100%    Last Pain:  Vitals:   10/10/23 0956  TempSrc:   PainSc: 2                  Butler Levander Pinal

## 2023-10-11 ENCOUNTER — Encounter (HOSPITAL_COMMUNITY): Payer: Self-pay | Admitting: Vascular Surgery

## 2023-10-12 NOTE — Telephone Encounter (Signed)
 VM received from pt returning call to txp coordinator. TC to pt to notify him that pt is NAC related to pulmonary function. Pt has COPD and per pulmonary note on 10/05/23, pt has chronic SOB since right lower lobectomy in 2008. Pt informed that they may be referred to another transplant center by their nephrologist and verbalized understanding of the above.

## 2023-10-18 ENCOUNTER — Other Ambulatory Visit: Payer: Self-pay | Admitting: Radiology

## 2023-10-18 DIAGNOSIS — N059 Unspecified nephritic syndrome with unspecified morphologic changes: Secondary | ICD-10-CM

## 2023-10-18 NOTE — Progress Notes (Signed)
 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 Patient reports no concerns today.   Referring Physician(s): Singh,Vikas  Supervising Physician: Jennefer Rover   Patient Status: [facility] - Out-pt  History of Present Illness: Walter Wilkerson is a 73 y.o. male with medical history significant for end stage renal disease and prior history of left arm brachiobasilic fistula creation, COPD, and HTN.  He is s/p left 1st stage BVT on 08/14/2023 by Dr. Lanis. He has a TDC that was placed by Dr. Lanis on 06/27/2023. He had a left arm brachiobasilic fistula revision, transposition on 10/10/2023 by Dr Lanis. Pt not a candidate for transplant due to pulmonary disease per note by transplant RN on 10/10/2023. He goes to HD on M/W/F. Pt reports he has been on a new medication prescribed by Dr Dennise in hopes of getting off of dialysis.   Received request for Interventional Radiology to perform kidney biopsy.   Reports he is not on any blood thinning medications. Confirms he has been NPO since midnight and has a ride available along with supervision at home for the next 24 hours.   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   ESRD (end stage renal disease) on dialysis (HCC)    Mon Wed Fri   Heart murmur    Hepatitis    Hypertension    Hypothyroidism    Lung cancer (HCC)    Neuropathy    Sleep apnea    uses CPAP nightly - auto set   Thyroid  disease     Past Surgical History:  Procedure Laterality Date   ANKLE FRACTURE SURGERY Right    AV FISTULA PLACEMENT Left 08/14/2023   Procedure:  ARTERIOVENOUS (AV) FISTULA CREATION;  Surgeon: Lanis Fonda BRAVO, MD;  Location: [facility] OR;  Service: Vascular;  Laterality: Left;   BASCILIC VEIN TRANSPOSITION Left 10/10/2023   Procedure: TRANSPOSITION, VEIN, BASILIC;  Surgeon: Lanis Fonda BRAVO, MD;  Location: [facility] OR;  Service: Vascular;  Laterality: Left;   COLONOSCOPY  05/2020   in [place]   INSERTION OF DIALYSIS CATHETER N/A 06/27/2023   Procedure: INSERTION OF DIALYSIS CATHETER;  Surgeon: Lanis Fonda BRAVO, MD;  Location: [facility] OR;  Service: Vascular;  Laterality: N/A;   LUNG LOBECTOMY Right 2008   RLL resection   R knee arthroscopy     REPLACEMENT TOTAL KNEE Right 09/15/2021   Dr. Norleen Gavel   UPPER GI ENDOSCOPY  08/2022   in [place]   VASECTOMY      Allergies: Pregabalin, Ferrous sulfate , Lisinopril, Merbromin, and Thimerosal (thiomersal)  Medications: Prior to Admission medications   Medication Sig Start Date End Date Taking? Authorizing Provider  albuterol  (VENTOLIN  HFA) 108 (90 Base) MCG/ACT inhaler Inhale 1-2 puffs into the lungs every 6 (six) hours as needed for wheezing or shortness of breath.    [provider]  aspirin  EC 81 MG tablet Take 81 mg by mouth daily.    [provider]  atorvastatin  (LIPITOR) 40 MG tablet Take 1 tablet (40 mg total) by mouth daily. 11/29/22   Breeback, Jade L, PA-C  clindamycin (CLEOCIN T) 1 % external solution Apply 1 Application topically 2 (  two) times daily as needed (for scalp irritation).    [provider]  clobetasol cream (TEMOVATE) 0.05 % Apply 1 Application topically 2 (two) times daily as needed (for scalp irritation).    [provider]  Continuous Glucose Sensor (FREESTYLE LIBRE 3 SENSOR) MISC 1 each by Does not apply route every 14 (fourteen) days. Please apply for 14 days and then switch to new sensor Patient not taking: Reported on 10/05/2023 06/02/23   Antoniette Rasher L, PA-C  cyanocobalamin  (VITAMIN B12) 1000 MCG tablet Take 1,000 mcg by mouth daily.    [provider]  DULoxetine  (CYMBALTA ) 30 MG capsule Take 1 capsule (30 mg total) by mouth daily. 06/07/23   Breeback, Jade L, PA-C  Fluticasone -Umeclidin-Vilant (TRELEGY ELLIPTA ) 100-62.5-25 MCG/ACT AEPB Inhale 1 puff into the lungs daily in the afternoon. 04/05/22   Sood, Vineet, MD  furosemide  (LASIX ) 40 MG tablet Take 1 tablet (40 mg total) by mouth daily. 07/07/23   Gonfa, Taye T, MD  HYDROcodone -acetaminophen  (NORCO) 10-325 MG tablet Take one tablet twice a day for pain no more than every 8 hours. 10/09/23   Breeback, Jade L, PA-C  levothyroxine  (SYNTHROID ) 150 MCG tablet Take 150 mcg by mouth daily before breakfast.    [provider]  levothyroxine  (SYNTHROID ) 175 MCG tablet Take 1 tablet (175 mcg total) by mouth daily before breakfast. Patient not taking: Reported on 10/06/2023 06/13/23   Breeback, Jade L, PA-C  Melatonin 10 MG TABS Take 10 mg by mouth at bedtime.    [provider]  NON FORMULARY Pt uses a cpap nightly    [provider]  pantoprazole  (PROTONIX ) 40 MG tablet Take 1 tablet (40 mg total) by mouth daily. 05/11/23 10/11/23  Perri DELENA Meliton Mickey., MD  PREVIDENT 0.2 % SOLN Take 5 mLs by mouth in the morning and at bedtime. 09/07/23   [provider]  sevelamer carbonate (RENVELA) 800 MG tablet Take 800 mg by mouth 3 (three) times daily. 10/03/23   [provider]  zinc gluconate 50 MG tablet Take 50 mg by mouth daily.    [provider]     Family History  Problem Relation Age of Onset   Heart attack Father    Hyperlipidemia Father    Hypertension Father    Heart disease Father    Diabetes Other        grandmother     Social History   Socioeconomic History   Marital status: Married    Spouse name: [name]   Number of children: 3   Years of education: 12   Highest education level: 12th grade  Occupational History   Occupation: [employer]    Comment: part time  Tobacco Use   Smoking status: Former    Current  packs/day: 0.00    Average packs/day: 1 pack/day for 30.0 years (30.0 ttl pk-yrs)    Types: Cigarettes, Cigars    Start date: 08/11/1974    Quit date: 08/10/2004    Years since quitting: 19.2    Passive exposure: Past   Smokeless tobacco: Never  Vaping Use   Vaping status: Never Used  Substance and Sexual Activity   Alcohol use: Not Currently    Alcohol/week: 21.0 standard drinks of alcohol    Types: 21 Shots of liquor per week   Drug use: Yes    Types: Marijuana    Comment: Last use 09/16/23 , edibles with THC - informed pt to withhold 24 hrs prior to surgery  Sexual activity: Not Currently    Partners: Female  Other Topics Concern   Not on file  Social History Narrative   Lives with his wife and son. 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   Social Drivers of Corporate investment banker Strain: Low Risk  (04/19/2023)   Overall Financial Resource Strain (CARDIA)    Difficulty of Paying Living Expenses: Not hard at all  Food Insecurity: No Food Insecurity (07/01/2023)   Hunger Vital Sign    Worried About Running Out of Food in the Last Year: Never true    Ran Out of Food in the Last Year: Never true  Transportation Needs: No Transportation Needs (07/01/2023)   PRAPARE - Administrator, Civil Service (Medical): No    Lack of Transportation (Non-Medical): No  Physical Activity: Insufficiently Active (04/19/2023)   Exercise Vital Sign    Days of Exercise per Week: 4 days    Minutes of Exercise per Session: 20 min  Stress: No Stress Concern Present (04/19/2023)   [employer] of Occupational Health - Occupational Stress Questionnaire    Feeling of Stress : Not at all  Social Connections: Unknown (07/01/2023)   Social Connection and Isolation Panel    Frequency of Communication with Friends and Family: Three times a week    Frequency of Social Gatherings with Friends and Family: Three times a week    Attends Religious Services: Not on file    Active Member of Clubs  or Organizations: Yes    Attends Banker Meetings: More than 4 times per year    Marital Status: Married    Review of Systems: A 12 point ROS discussed and pertinent positives are indicated in the HPI above.  All other systems are negative.  Review of Systems  Constitutional:  Negative for fatigue and fever.  Respiratory:  Negative for shortness of breath.   Cardiovascular:  Negative for chest pain.  Gastrointestinal:  Negative for abdominal pain, diarrhea, nausea and vomiting.  Neurological:  Negative for headaches.    Vital Signs: BP 133/78   Pulse 66   Temp 97.7 F (36.5 C) (Oral)   Resp 17   Ht 5' 8 (1.727 m)   Wt 151 lb (68.5 kg)   SpO2 93%   BMI 22.96 kg/m   Advance Care Plan: no documents on file   Physical Exam Cardiovascular:     Rate and Rhythm: Normal rate and regular rhythm.  Pulmonary:     Effort: Pulmonary effort is normal. No respiratory distress.     Breath sounds: Normal breath sounds.  Abdominal:     Palpations: Abdomen is soft.  Skin:    General: Skin is warm and dry.  Neurological:     Mental Status: He is alert and oriented to person, place, and time.  Psychiatric:        Mood and Affect: Mood normal.        Behavior: Behavior normal.     Imaging: VAS US  DUPLEX DIALYSIS ACCESS (AVF, AVG) Result Date: 09/28/2023 DIALYSIS ACCESS Patient Name:  Walter Wilkerson  Date of Exam:   09/28/2023 Medical Rec #: [id number]       Accession #:    [id number] Date of Birth: 07-10-1950       Patient Gender: M Patient Age:   8 years Exam Location:  [address] Procedure:      VAS US  DUPLEX DIALYSIS ACCESS (AVF, AVG) Referring Phys: FONDA ROBINS --------------------------------------------------------------------------------  Reason for Exam: ESRD.  Access Site: Left Upper Extremity. Access Type: Brachial-basilic AVF. Comparison Study: N/A Performing Technologist: Dena Pane  Examination Guidelines: A complete evaluation includes B-mode imaging,  spectral Doppler, color Doppler, and power Doppler as needed of all accessible portions of each vessel. Unilateral testing is considered an integral part of a complete examination. Limited examinations for reoccurring indications may be performed as noted.  Findings: +--------------------+----------+-----------------+--------+ AVF                 PSV (cm/s)Flow Vol (mL/min)Comments +--------------------+----------+-----------------+--------+ Native artery inflow   142           865                +--------------------+----------+-----------------+--------+ AVF Anastomosis        441                              +--------------------+----------+-----------------+--------+  +------------+----------+-------------+----------+-----------------------------+ OUTFLOW VEINPSV (cm/s)Diameter (cm)Depth (cm)          Describe            +------------+----------+-------------+----------+-----------------------------+ Prox UA         47        1.07        0.56                                 +------------+----------+-------------+----------+-----------------------------+ Mid UA          94        0.79        0.52                                 +------------+----------+-------------+----------+-----------------------------+ Dist UA        576        0.48        0.46   branch is seen in the distal                                                         upper arm           +------------+----------+-------------+----------+-----------------------------+  Summary: Patent left brachial-basilic AVF. There is no evidence of stenosis.  *See table(s) above for measurements and observations.  Diagnosing physician: Fonda Rim Electronically signed by Fonda Rim on 09/28/2023 at 2:32:47 PM.    --------------------------------------------------------------------------------   Final     Labs:  CBC: Recent Labs    08/17/23 1023 08/29/23 1030 09/19/23 1126 10/10/23 0619 10/19/23 0701   WBC 10.5 9.6 8.0  --  8.4  HGB 8.6* 8.7* 8.9* 10.5* 9.9*  HCT 26.9* 27.1* 28.0* 31.0* 30.3*  PLT 314 291 297  --  295    COAGS: Recent Labs    05/02/23 0941 10/19/23 0701  INR 1.1 1.0    BMP: Recent Labs    07/05/23 0614 07/06/23 0559 07/11/23 0000 08/01/23 1344 08/14/23 0905 08/29/23 1030 10/10/23 0619  NA 135 132* 132* 135 129* 138 134*  K 4.3 3.8 4.7 3.9 4.1 4.0 3.9  CL 98 98 94* 94* 94* 95* 97*  CO2 21* 19* 20 25  --  30  --   GLUCOSE 90 91 166* 122* 82 91 85  BUN 68* 78* 77* 29* 36* 13 18  CALCIUM  8.5* 8.3* 8.4* 9.6  --  9.7  --   CREATININE 4.78* 4.83* 4.29* 4.63* 4.90* 3.47* 4.10*  GFRNONAA 12* 12*  --  13*  --  18*  --     LIVER FUNCTION TESTS: Recent Labs    07/05/23 0614 07/06/23 0559 07/11/23 0000 08/01/23 1344 08/29/23 1030  BILITOT 0.6  --  <0.2 0.3 0.3  AST 10*  --  11 12* 13*  ALT 10  --  9 7 5   ALKPHOS 207*  --  170* 81 69  PROT 5.9*  --  5.7* 6.8 6.9  ALBUMIN  2.4* 2.2* 3.3* 3.6 4.0    TUMOR MARKERS: No results for input(s): AFPTM, CEA, CA199, CHROMGRNA in the last 8760 hours.  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 Lanis -[facility] on 06/27/2023 by Dr. Lanis -left arm brachiobasilic fistula revision, transposition on 10/10/2023 by Dr Lanis -left kidney biopsy done in 04/2023  -BP 133/78 today  Risks and benefits of kidney biopsy was discussed with the patient and/or patient's family including, but not limited to bleeding, infection, damage to adjacent structures or low yield requiring additional tests.  All of the questions were answered and there is agreement to proceed.  Consent signed and in chart.    Thank you for this interesting consult.  I greatly enjoyed meeting Walter Wilkerson and look forward to participating in their care.  A copy of this report was sent to the requesting provider on this date.  Electronically Signed: Sabas Frett B Annelie Boak,  NP 10/19/2023, 8:12 AM   I spent a total of 30 Minutes  in face to face in clinical consultation, greater than 50% of which was counseling/coordinating care for crescentic glomerulonephritis.

## 2023-10-19 ENCOUNTER — Ambulatory Visit (HOSPITAL_COMMUNITY)
Admission: RE | Admit: 2023-10-19 | Discharge: 2023-10-19 | Disposition: A | Discharge status: Home / Self Care | Source: Ambulatory Visit | Attending: Nephrology | Admitting: Nephrology

## 2023-10-19 ENCOUNTER — Other Ambulatory Visit: Payer: Self-pay

## 2023-10-19 DIAGNOSIS — N186 End stage renal disease: Secondary | ICD-10-CM | POA: Insufficient documentation

## 2023-10-19 DIAGNOSIS — J449 Chronic obstructive pulmonary disease, unspecified: Secondary | ICD-10-CM | POA: Insufficient documentation

## 2023-10-19 DIAGNOSIS — N059 Unspecified nephritic syndrome with unspecified morphologic changes: Secondary | ICD-10-CM

## 2023-10-19 DIAGNOSIS — I120 Hypertensive chronic kidney disease with stage 5 chronic kidney disease or end stage renal disease: Secondary | ICD-10-CM | POA: Insufficient documentation

## 2023-10-19 DIAGNOSIS — Z87891 Personal history of nicotine dependence: Secondary | ICD-10-CM | POA: Diagnosis not present

## 2023-10-19 DIAGNOSIS — N057 Unspecified nephritic syndrome with diffuse crescentic glomerulonephritis: Secondary | ICD-10-CM

## 2023-10-19 DIAGNOSIS — Z992 Dependence on renal dialysis: Secondary | ICD-10-CM | POA: Insufficient documentation

## 2023-10-19 DIAGNOSIS — Z8701 Personal history of pneumonia (recurrent): Secondary | ICD-10-CM | POA: Diagnosis not present

## 2023-10-19 LAB — PROTIME-INR
INR: 1 (ref 0.8–1.2)
Prothrombin Time: 13.5 s (ref 11.4–15.2)

## 2023-10-19 LAB — CBC
HCT: 30.3 % — ABNORMAL LOW (ref 39.0–52.0)
Hemoglobin: 9.9 g/dL — ABNORMAL LOW (ref 13.0–17.0)
MCH: 28.4 pg (ref 26.0–34.0)
MCHC: 32.7 g/dL (ref 30.0–36.0)
MCV: 86.8 fL (ref 80.0–100.0)
Platelets: 295 K/uL (ref 150–400)
RBC: 3.49 MIL/uL — ABNORMAL LOW (ref 4.22–5.81)
RDW: 14.3 % (ref 11.5–15.5)
WBC: 8.4 K/uL (ref 4.0–10.5)
nRBC: 0 % (ref 0.0–0.2)

## 2023-10-19 MED ORDER — GELATIN ABSORBABLE 12-7 MM EX MISC
1.0000 | Freq: Once | CUTANEOUS | Status: AC
  Administered 2023-10-19: 1 via TOPICAL

## 2023-10-19 MED ORDER — LIDOCAINE HCL (PF) 1 % IJ SOLN
10.0000 mL | Freq: Once | INTRAMUSCULAR | Status: AC
Start: 2023-10-19 — End: 2023-10-19
  Administered 2023-10-19: 10 mL via INTRADERMAL

## 2023-10-19 MED ORDER — SODIUM CHLORIDE 0.9 % IV SOLN: INTRAVENOUS | Status: DC

## 2023-10-19 MED ORDER — FENTANYL CITRATE (PF) 100 MCG/2ML IJ SOLN
INTRAMUSCULAR | Status: AC | PRN
  Administered 2023-10-19 (×2): 50 ug via INTRAVENOUS

## 2023-10-19 MED ORDER — MIDAZOLAM HCL 2 MG/2ML IJ SOLN
INTRAMUSCULAR | Status: AC
  Filled 2023-10-19: qty 4

## 2023-10-19 MED ORDER — MIDAZOLAM HCL 2 MG/2ML IJ SOLN
INTRAMUSCULAR | Status: AC | PRN
  Administered 2023-10-19: 1 mg via INTRAVENOUS
  Administered 2023-10-19 (×2): .5 mg via INTRAVENOUS

## 2023-10-19 MED ORDER — FENTANYL CITRATE (PF) 100 MCG/2ML IJ SOLN
INTRAMUSCULAR | Status: AC
  Filled 2023-10-19: qty 4

## 2023-10-19 NOTE — Procedures (Signed)
 Interventional Radiology Procedure Note  Procedure: Ultrasound guided left renal biopsy   Findings: Please refer to procedural dictation for full description. 18 ga core x2 from inferior pole.  Gelfoam slurry needle track embolization.  Complications: None immediate  Estimated Blood Loss: < 5 ml  Recommendations: Strict 3 hour bedrest. Follow Pathology results.   Ester Sides, MD

## 2023-10-19 NOTE — Progress Notes (Signed)
 No s/s of complications at incision site. Discharge instructions reviewed with patient and son Kennyth at bedside. Verbalized understanding denies questions or concerns. PT tolerated PO intake, able to void in the bathroom, ambulate in the hallway  without difficulty. PT was escorted from the unit via wheel cahir to personal vehicle.

## 2023-10-23 ENCOUNTER — Encounter (HOSPITAL_COMMUNITY): Payer: Self-pay

## 2023-10-24 ENCOUNTER — Inpatient Hospital Stay: Admitting: Medical Oncology

## 2023-10-24 ENCOUNTER — Inpatient Hospital Stay

## 2023-10-24 LAB — SURGICAL PATHOLOGY

## 2023-10-30 ENCOUNTER — Other Ambulatory Visit: Payer: Self-pay | Admitting: Medical Oncology

## 2023-10-30 DIAGNOSIS — D631 Anemia in chronic kidney disease: Secondary | ICD-10-CM

## 2023-10-30 DIAGNOSIS — N183 Chronic kidney disease, stage 3 unspecified: Secondary | ICD-10-CM

## 2023-10-30 DIAGNOSIS — D696 Thrombocytopenia, unspecified: Secondary | ICD-10-CM

## 2023-10-30 DIAGNOSIS — D649 Anemia, unspecified: Secondary | ICD-10-CM

## 2023-10-30 DIAGNOSIS — E639 Nutritional deficiency, unspecified: Secondary | ICD-10-CM

## 2023-10-30 DIAGNOSIS — N186 End stage renal disease: Secondary | ICD-10-CM | POA: Diagnosis not present

## 2023-10-30 DIAGNOSIS — D500 Iron deficiency anemia secondary to blood loss (chronic): Secondary | ICD-10-CM

## 2023-10-30 DIAGNOSIS — D509 Iron deficiency anemia, unspecified: Secondary | ICD-10-CM

## 2023-10-31 ENCOUNTER — Inpatient Hospital Stay: Attending: Hematology & Oncology

## 2023-10-31 ENCOUNTER — Inpatient Hospital Stay

## 2023-10-31 ENCOUNTER — Inpatient Hospital Stay: Admitting: Medical Oncology

## 2023-11-01 ENCOUNTER — Encounter: Payer: Self-pay | Admitting: Nephrology

## 2023-11-02 DIAGNOSIS — N2581 Secondary hyperparathyroidism of renal origin: Secondary | ICD-10-CM | POA: Diagnosis not present

## 2023-11-02 DIAGNOSIS — N057 Unspecified nephritic syndrome with diffuse crescentic glomerulonephritis: Secondary | ICD-10-CM | POA: Diagnosis not present

## 2023-11-02 DIAGNOSIS — N185 Chronic kidney disease, stage 5: Secondary | ICD-10-CM | POA: Diagnosis not present

## 2023-11-02 DIAGNOSIS — I120 Hypertensive chronic kidney disease with stage 5 chronic kidney disease or end stage renal disease: Secondary | ICD-10-CM | POA: Diagnosis not present

## 2023-11-02 DIAGNOSIS — N186 End stage renal disease: Secondary | ICD-10-CM | POA: Diagnosis not present

## 2023-11-02 DIAGNOSIS — E875 Hyperkalemia: Secondary | ICD-10-CM | POA: Diagnosis not present

## 2023-11-02 DIAGNOSIS — D631 Anemia in chronic kidney disease: Secondary | ICD-10-CM | POA: Diagnosis not present

## 2023-11-06 DIAGNOSIS — N185 Chronic kidney disease, stage 5: Secondary | ICD-10-CM | POA: Diagnosis not present

## 2023-11-08 ENCOUNTER — Other Ambulatory Visit: Payer: Self-pay | Admitting: Physician Assistant

## 2023-11-08 NOTE — Progress Notes (Unsigned)
 POST OPERATIVE OFFICE NOTE    CC:  F/u for surgery  HPI:  This is a 73 y.o. male who is s/p left 1st stage BVT on 08/14/2023 and left 2nd stage BVT on 10/10/2023 by Dr. Lanis.  He has TDC that was placed 06/27/2023 by Dr. Lanis.  Pt states he does not have pain/numbness in the left hand.     He states he has been off HD for 15 days.  He has been started on a medication and he is hopeful he will stay off of dialysis.    He inquires about TDC removal.    Allergies  Allergen Reactions   Pregabalin Other (See Comments)    Elevated LFTs   Ferrous Sulfate  Nausea And Vomiting, Nausea Only and Other (See Comments)    325 mg once a day caused EXTREME NAUSEA   Lisinopril Nausea Only   Merbromin Hives   Thimerosal (Thiomersal) Hives    Current Outpatient Medications  Medication Sig Dispense Refill   albuterol  (VENTOLIN  HFA) 108 (90 Base) MCG/ACT inhaler Inhale 1-2 puffs into the lungs every 6 (six) hours as needed for wheezing or shortness of breath.     aspirin  EC 81 MG tablet Take 81 mg by mouth daily.     atorvastatin  (LIPITOR) 40 MG tablet Take 1 tablet (40 mg total) by mouth daily. 90 tablet 3   clindamycin (CLEOCIN T) 1 % external solution Apply 1 Application topically 2 (two) times daily as needed (for scalp irritation).     clobetasol cream (TEMOVATE) 0.05 % Apply 1 Application topically 2 (two) times daily as needed (for scalp irritation).     Continuous Glucose Sensor (FREESTYLE LIBRE 3 SENSOR) MISC 1 each by Does not apply route every 14 (fourteen) days. Please apply for 14 days and then switch to new sensor (Patient not taking: Reported on 10/05/2023) 2 each 11   cyanocobalamin  (VITAMIN B12) 1000 MCG tablet Take 1,000 mcg by mouth daily.     DULoxetine  (CYMBALTA ) 30 MG capsule Take 1 capsule (30 mg total) by mouth daily. 90 capsule 1   Fluticasone -Umeclidin-Vilant (TRELEGY ELLIPTA ) 100-62.5-25 MCG/ACT AEPB Inhale 1 puff into the lungs daily in the afternoon. 60 each 5    furosemide  (LASIX ) 40 MG tablet Take 1 tablet (40 mg total) by mouth daily. 30 tablet 0   HYDROcodone -acetaminophen  (NORCO) 10-325 MG tablet Take one tablet twice a day for pain no more than every 8 hours. 60 tablet 0   levothyroxine  (SYNTHROID ) 150 MCG tablet Take 150 mcg by mouth daily before breakfast.     levothyroxine  (SYNTHROID ) 175 MCG tablet Take 1 tablet (175 mcg total) by mouth daily before breakfast. (Patient not taking: Reported on 10/06/2023) 90 tablet 1   Melatonin 10 MG TABS Take 10 mg by mouth at bedtime.     NON FORMULARY Pt uses a cpap nightly     pantoprazole  (PROTONIX ) 40 MG tablet Take 1 tablet (40 mg total) by mouth daily. 30 tablet 1   PREVIDENT 0.2 % SOLN Take 5 mLs by mouth in the morning and at bedtime.     sevelamer carbonate (RENVELA) 800 MG tablet Take 800 mg by mouth 3 (three) times daily.     zinc gluconate 50 MG tablet Take 50 mg by mouth daily.     No current facility-administered medications for this visit.     ROS:  See HPI  Physical Exam:   Incision:  all incisions have healed nicely Extremities:   There is a palpable left  radial pulse.   Motor and sensory are in tact.   There is a thrill/bruit present.  Access is  easily palpable    Assessment/Plan:  This is a 73 y.o. male who is s/p:  left 1st stage BVT on 08/14/2023 and left 2nd stage BVT on 10/10/2023 by Dr. Lanis   -the pt does not have evidence of steal. -pt has been off of dialysis for 15 days.  He inquires about TDC removal.  I have asked him to contact Dr. Dennise for him to call us  to get this scheduled if he is ok with the catheter removal.  Discussed with pt about removing the catheter at the hospital.  Discussed that it is usually scheduled at 11am but sometimes, we are in OR and he may have to wait.  He expressed good understanding.   -the pt will follow up as needed.    Lucie Apt, [facility] [phone]  Clinic MD:  Lanis

## 2023-11-08 NOTE — Telephone Encounter (Signed)
 HYDROCODONE -ACE Last OV: 09/05/23 Next OV: 03/05/24 Last RF: 10/09/23

## 2023-11-09 ENCOUNTER — Ambulatory Visit: Attending: Vascular Surgery | Admitting: Physician Assistant

## 2023-11-09 DIAGNOSIS — N185 Chronic kidney disease, stage 5: Secondary | ICD-10-CM

## 2023-11-20 ENCOUNTER — Encounter: Payer: Self-pay | Admitting: Hematology & Oncology

## 2023-11-20 NOTE — Telephone Encounter (Signed)
 Walter Wilkerson

## 2023-11-29 ENCOUNTER — Telehealth: Payer: Self-pay | Admitting: Pulmonary Disease

## 2023-11-29 NOTE — Telephone Encounter (Signed)
 Patient was called back regarding CRM below. He sounded very upset, stating no one called him about his CPAP supplies. Therefore he reached out to [employer] and they reassured him this would be taking care of. However patient was still upset stating he's not coming back to clinic.     Reason for CRM:    Pt is contacting clinic regarding the order placed for CPAP supplies by [facility]. Reviewed chart and advised order was sent and verified received by [employer] on 9/25. He has not heard from [employer] concerning the order   Requested call back with status update, did also advised pt reach out to [employer]

## 2023-11-29 NOTE — Telephone Encounter (Signed)
 Copied from CRM [id number]. Topic: Clinical - Order For Equipment >> Nov 29, 2023  9:34 AM Walter Wilkerson wrote: Reason for CRM:   Pt is contacting clinic regarding the order placed for CPAP supplies by [facility]. Reviewed chart and advised order was sent and verified received by [employer] on 9/25. He has not heard from [employer] concerning the order  Requested call back with status update, did also advised pt reach out to [employer]   CB#  [phone]

## 2023-12-11 ENCOUNTER — Ambulatory Visit: Payer: Self-pay | Admitting: Physician Assistant

## 2023-12-11 ENCOUNTER — Ambulatory Visit

## 2023-12-11 ENCOUNTER — Ambulatory Visit: Admitting: Physician Assistant

## 2023-12-11 ENCOUNTER — Telehealth: Payer: Self-pay

## 2023-12-11 ENCOUNTER — Other Ambulatory Visit: Payer: Self-pay | Admitting: Physician Assistant

## 2023-12-11 ENCOUNTER — Ambulatory Visit: Payer: Self-pay | Admitting: *Deleted

## 2023-12-11 VITALS — BP 141/79 | HR 77 | Ht 68.0 in | Wt 149.0 lb

## 2023-12-11 DIAGNOSIS — N186 End stage renal disease: Secondary | ICD-10-CM

## 2023-12-11 DIAGNOSIS — E039 Hypothyroidism, unspecified: Secondary | ICD-10-CM

## 2023-12-11 DIAGNOSIS — M25562 Pain in left knee: Secondary | ICD-10-CM | POA: Diagnosis not present

## 2023-12-11 DIAGNOSIS — W19XXXA Unspecified fall, initial encounter: Secondary | ICD-10-CM

## 2023-12-11 DIAGNOSIS — E782 Mixed hyperlipidemia: Secondary | ICD-10-CM

## 2023-12-11 DIAGNOSIS — G894 Chronic pain syndrome: Secondary | ICD-10-CM | POA: Diagnosis not present

## 2023-12-11 DIAGNOSIS — S82035A Nondisplaced transverse fracture of left patella, initial encounter for closed fracture: Secondary | ICD-10-CM

## 2023-12-11 DIAGNOSIS — I10 Essential (primary) hypertension: Secondary | ICD-10-CM | POA: Diagnosis not present

## 2023-12-11 DIAGNOSIS — D631 Anemia in chronic kidney disease: Secondary | ICD-10-CM

## 2023-12-11 MED ORDER — HYDROCODONE-ACETAMINOPHEN 10-325 MG PO TABS: ORAL_TABLET | ORAL | 0 refills | Status: DC

## 2023-12-11 MED ORDER — LEVOTHYROXINE SODIUM 150 MCG PO TABS: 150.0000 ug | ORAL_TABLET | Freq: Every day | ORAL | 1 refills | Status: AC

## 2023-12-11 MED ORDER — ATORVASTATIN CALCIUM 40 MG PO TABS: 40.0000 mg | ORAL_TABLET | Freq: Every day | ORAL | 3 refills | Status: AC

## 2023-12-11 MED ORDER — DULOXETINE HCL 30 MG PO CPEP: 30.0000 mg | ORAL_CAPSULE | Freq: Every day | ORAL | 1 refills | Status: AC

## 2023-12-11 NOTE — Telephone Encounter (Signed)
 FYI Only or Action Required?: FYI only for provider: appointment scheduled on 12/1.  Patient was last seen in primary care on 09/05/2023 by Antoniette Vermell CROME, PA-C.  Called Nurse Triage reporting Knee Injury.  Symptoms began a week ago.  Interventions attempted: Rest, hydration, or home remedies.  Symptoms are: unchanged.  Triage Disposition: See Physician Within 24 Hours  Patient/caregiver understands and will follow disposition?: Yes  Copied from CRM #[id number]. Topic: Clinical - Red Word Triage >> Dec 11, 2023  9:57 AM Mercer PEDLAR wrote: Red Word that prompted transfer to Nurse Triage: Patient had a fall a week and a half ago and would like to schedule an appointment to be seen. Reason for Disposition  [1] MODERATE pain (e.g., interferes with normal activities, limping) AND [2] high-risk adult (e.g., age > 60 years, osteoporosis, chronic steroid use)  Answer Assessment - Initial Assessment Questions 1. MECHANISM: How did the injury happen? (e.g., twisting injury, direct blow)      Tripped while getting Christmas down 2. ONSET: When did the injury happen? (e.g., minutes, hours ago)      1 week ago 3. LOCATION: Where is the injury located?      Fell and hit L knee, shoulder  5. SEVERITY: Can you put weight on that leg? Can you walk?      Yes- does want to give  at times  7. PAIN: Is there pain? If Yes, ask: How bad is the pain?   What does it keep you from doing? (Scale 0-10; or none, mild, moderate, severe)     Mild/moderate- can be sharp at times  Patient was frustrated with long hold-  just needs appointment- did gather some information while scheduling .  Protocols used: Knee Injury-A-AH

## 2023-12-11 NOTE — Progress Notes (Unsigned)
   Acute Office Visit  Subjective:     Patient ID: Walter Wilkerson, male    DOB: 1950/05/13, 73 y.o.   MRN: [id number]  Chief Complaint  Patient presents with   Knee Injury    Clemens on left knee after  getting ornaments from the attic, pt c/o weakness and sharp pain that radiates to the hips  x 1 week     HPI Patient is in today for ***  ROS      Objective:    BP (!) 141/79   Pulse (!) 1   Ht 5' 8 (1.727 m)   Wt 149 lb (67.6 kg)   SpO2 99%   BMI 22.66 kg/m  {Vitals History (Optional):23777}  Physical Exam  No results found for any visits on 12/11/23.      Assessment & Plan:   Problem List Items Addressed This Visit   None   No orders of the defined types were placed in this encounter.   No follow-ups on file.  Meilah Delrosario, PA-C

## 2023-12-11 NOTE — Telephone Encounter (Signed)
 Spoke with Brad at [employer]- at the time order was placed it was too soon to get supplies but he is good to go now and [employer] will be reaching out to pt to see what is needed. NFN

## 2023-12-11 NOTE — Telephone Encounter (Signed)
 Pt called and stated that he saw Dr Theophilus in September 2025 and we had ordered CPAP supplies but pt stated he never received them. Pt states he contacted [employer] [facility] and they told him they dont have an order for him however, [employer] told us  they did receive the order.   PCC's, can you urgently find out what the problem is with [employer] so pt can urgently receive his supplies.    Please give pt an updated call regarding his order.

## 2023-12-11 NOTE — Progress Notes (Signed)
 Xray confirms a patellar fracture, non-displaced. You do need to come back tomorrow for immobilizer brace for 4-6 weeks. Will follow up in 4 weeks with xray.

## 2023-12-11 NOTE — Patient Instructions (Signed)
 Get labs when fasting.  Hydrocodone  refilled for pain control.  Get knee sleeve brace.  Ice 15 minutes twice a day.  Get xray.  Knee xrays to start after 1 more week.   Knee Exercises Ask your health care provider which exercises are safe for you. Do exercises exactly as told by your health care provider and adjust them as directed. It is normal to feel mild stretching, pulling, tightness, or discomfort as you do these exercises. Stop right away if you feel sudden pain or your pain gets worse. Do not begin these exercises until told by your health care provider. Stretching and range-of-motion exercises These exercises warm up your muscles and joints and improve the movement and flexibility of your knee. These exercises also help to relieve pain and swelling. Knee extension, prone  Lie on your abdomen (prone position) on a bed. Place your left / right knee just beyond the edge of the surface so your knee is not on the bed. You can put a towel under your left / right thigh just above your kneecap for comfort. Relax your leg muscles and allow gravity to straighten your knee (extension). You should feel a stretch behind your left / right knee. Hold this position for __________ seconds. Scoot up so your knee is supported between repetitions. Repeat __________ times. Complete this exercise __________ times a day. Knee flexion, active  Lie on your back with both legs straight. If this causes back discomfort, bend your left / right knee so your foot is flat on the floor. Slowly slide your left / right heel back toward your buttocks. Stop when you feel a gentle stretch in the front of your knee or thigh (flexion). Hold this position for __________ seconds. Slowly slide your left / right heel back to the starting position. Repeat __________ times. Complete this exercise __________ times a day. Quadriceps stretch, prone  Lie on your abdomen on a firm surface, such as a bed or padded floor. Bend  your left / right knee and hold your ankle. If you cannot reach your ankle or pant leg, loop a belt around your foot and grab the belt instead. Gently pull your heel toward your buttocks. Your knee should not slide out to the side. You should feel a stretch in the front of your thigh and knee (quadriceps). Hold this position for __________ seconds. Repeat __________ times. Complete this exercise __________ times a day. Hamstring, supine  Lie on your back (supine position). Loop a belt or towel over the ball of your left / right foot. The ball of your foot is on the walking surface, right under your toes. Straighten your left / right knee and slowly pull on the belt to raise your leg until you feel a gentle stretch behind your knee (hamstring). Do not let your knee bend while you do this. Keep your other leg flat on the floor. Hold this position for __________ seconds. Repeat __________ times. Complete this exercise __________ times a day. Strengthening exercises These exercises build strength and endurance in your knee. Endurance is the ability to use your muscles for a long time, even after they get tired. Quadriceps, isometric This exercise strengthens the muscles in front of your thigh (quadriceps) without moving your knee joint (isometric). Lie on your back with your left / right leg extended and your other knee bent. Put a rolled towel or small pillow under your knee if told by your health care provider. Slowly tense the muscles in the front  of your left / right thigh. You should see your kneecap slide up toward your hip or see increased dimpling just above the knee. This motion will push the back of the knee toward the floor. For __________ seconds, hold the muscle as tight as you can without increasing your pain. Relax the muscles slowly and completely. Repeat __________ times. Complete this exercise __________ times a day. Straight leg raises This exercise strengthens the muscles in  front of your thigh (quadriceps) and the muscles that move your hips (hip flexors). Lie on your back with your left / right leg extended and your other knee bent. Tense the muscles in the front of your left / right thigh. You should see your kneecap slide up or see increased dimpling just above the knee. Your thigh may even shake a bit. Keep these muscles tight as you raise your leg 4-6 inches (10-15 cm) off the floor. Do not let your knee bend. Hold this position for __________ seconds. Keep these muscles tense as you lower your leg. Relax your muscles slowly and completely after each repetition. Repeat __________ times. Complete this exercise __________ times a day. Hamstring, isometric  Lie on your back on a firm surface. Bend your left / right knee about __________ degrees. Dig your left / right heel into the surface as if you are trying to pull it toward your buttocks. Tighten the muscles in the back of your thighs (hamstring) to dig as hard as you can without increasing any pain. Hold this position for __________ seconds. Release the tension gradually and allow your muscles to relax completely for __________ seconds after each repetition. Repeat __________ times. Complete this exercise __________ times a day. Hamstring curls If told by your health care provider, do this exercise while wearing ankle weights. Begin with __________lb / kg weights. Then increase the weight by 1 lb (0.5 kg) increments. Do not wear ankle weights that are more than __________lb / kg. Lie on your abdomen with your legs straight. Bend your left / right knee as far as you can without feeling pain. Keep your hips flat against the floor. Hold this position for __________ seconds. Slowly lower your leg to the starting position. Repeat __________ times. Complete this exercise __________ times a day. Squats This exercise strengthens the muscles in front of your thigh and knee (quadriceps). Stand in front of a  table, with your feet and knees pointing straight ahead. You may rest your hands on the table for balance but not for support. Slowly bend your knees and lower your hips like you are going to sit in a chair. Keep your weight over your heels, not over your toes. Keep your lower legs upright so they are parallel with the table legs. Do not let your hips go lower than your knees. Do not bend lower than told by your health care provider. If your knee pain increases, do not bend as low. Hold the squat position for __________ seconds. Slowly push with your legs to return to standing. Do not use your hands to pull yourself to standing. Repeat __________ times. Complete this exercise __________ times a day. Wall slides This exercise strengthens the muscles in front of your thigh and knee (quadriceps). Lean your back against a smooth wall or door, and walk your feet out 18-24 inches (46-61 cm) from it. Place your feet hip-width apart. Slowly slide down the wall or door until your knees bend __________ degrees. Keep your knees over your heels, not over your  toes. Keep your knees in line with your hips. Hold this position for __________ seconds. Repeat __________ times. Complete this exercise __________ times a day. Straight leg raises, side-lying This exercise strengthens the muscles that rotate the leg at the hip and move it away from your body (hip abductors). Lie on your side with your left / right leg in the top position. Lie so your head, shoulder, knee, and hip line up. You may bend your bottom knee to help you keep your balance. Roll your hips slightly forward so your hips are stacked directly over each other and your left / right knee is facing forward. Leading with your heel, lift your top leg 4-6 inches (10-15 cm). You should feel the muscles in your outer hip lifting. Do not let your foot drift forward. Do not let your knee roll toward the ceiling. Hold this position for __________  seconds. Slowly return your leg to the starting position. Let your muscles relax completely after each repetition. Repeat __________ times. Complete this exercise __________ times a day. Straight leg raises, prone This exercise stretches the muscles that move your hips away from the front of the pelvis (hip extensors). Lie on your abdomen on a firm surface. You can put a pillow under your hips if that is more comfortable. Tense the muscles in your buttocks and lift your left / right leg about 4-6 inches (10-15 cm). Keep your knee straight as you lift your leg. Hold this position for __________ seconds. Slowly lower your leg to the starting position. Let your leg relax completely after each repetition. Repeat __________ times. Complete this exercise __________ times a day. This information is not intended to replace advice given to you by your health care provider. Make sure you discuss any questions you have with your health care provider. Document Revised: 09/08/2020 Document Reviewed: 09/08/2020 [employer] Patient Education  2024 [employer].

## 2023-12-11 NOTE — Telephone Encounter (Signed)
 Patient scheduled today 12/11/2023 with Vermell Bologna, PA

## 2023-12-11 NOTE — Telephone Encounter (Signed)
 Brad called and stated order has been sent to [facility] and was being resent to be filled. I called pt and LVM with [employer] number

## 2023-12-12 ENCOUNTER — Encounter: Payer: Self-pay | Admitting: Physician Assistant

## 2023-12-12 ENCOUNTER — Ambulatory Visit (INDEPENDENT_AMBULATORY_CARE_PROVIDER_SITE_OTHER)

## 2023-12-12 DIAGNOSIS — M25562 Pain in left knee: Secondary | ICD-10-CM | POA: Insufficient documentation

## 2023-12-12 DIAGNOSIS — E782 Mixed hyperlipidemia: Secondary | ICD-10-CM | POA: Insufficient documentation

## 2023-12-12 NOTE — Progress Notes (Signed)
 Patient informed of results and scheduled today for immobilzer brace placement by nurse.

## 2023-12-12 NOTE — Progress Notes (Signed)
   Subjective:    Patient ID: Walter Wilkerson, male    DOB: 1950/12/04, 73 y.o.   MRN: [id number]  HPI  Patient is here today for a left knee mobilizer per Vermell Bologna, PA-C.  Review of Systems     Objective:   Physical Exam        Assessment & Plan:   Patient given a left knee immobilizer

## 2023-12-13 ENCOUNTER — Ambulatory Visit

## 2023-12-18 ENCOUNTER — Telehealth: Payer: Self-pay | Admitting: Physician Assistant

## 2023-12-18 DIAGNOSIS — Z85118 Personal history of other malignant neoplasm of bronchus and lung: Secondary | ICD-10-CM

## 2023-12-18 DIAGNOSIS — R918 Other nonspecific abnormal finding of lung field: Secondary | ICD-10-CM

## 2023-12-18 DIAGNOSIS — J432 Centrilobular emphysema: Secondary | ICD-10-CM

## 2023-12-18 DIAGNOSIS — Z902 Acquired absence of lung [part of]: Secondary | ICD-10-CM

## 2023-12-18 NOTE — Telephone Encounter (Signed)
 Patient called stated he was not happy with Dr. Avis he prefers to see someone else please advise

## 2023-12-18 NOTE — Telephone Encounter (Signed)
 For Pulmonary Function

## 2023-12-18 NOTE — Telephone Encounter (Signed)
Another referral was placed

## 2023-12-19 ENCOUNTER — Other Ambulatory Visit: Payer: Self-pay | Admitting: Physician Assistant

## 2024-01-12 IMAGING — US US ABDOMEN COMPLETE
1 series · 14 of 25 positions shown · non-contrast
Comparison: CT 06/01/2009.

CLINICAL DATA: RUQ and RLQ pain, intractable nausea

EXAM:
ABDOMEN ULTRASOUND COMPLETE

[Series 1: us abdomen complete · 90 acquisitions, 14 frames shown]
[im 1/90]
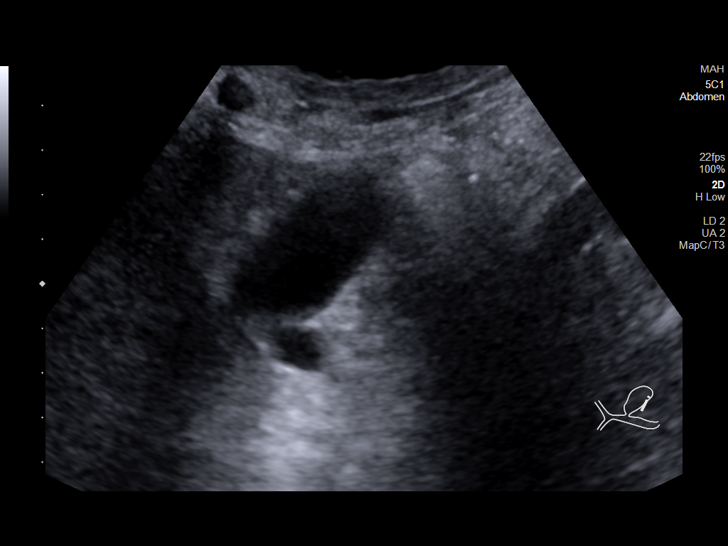
[im 8/90]
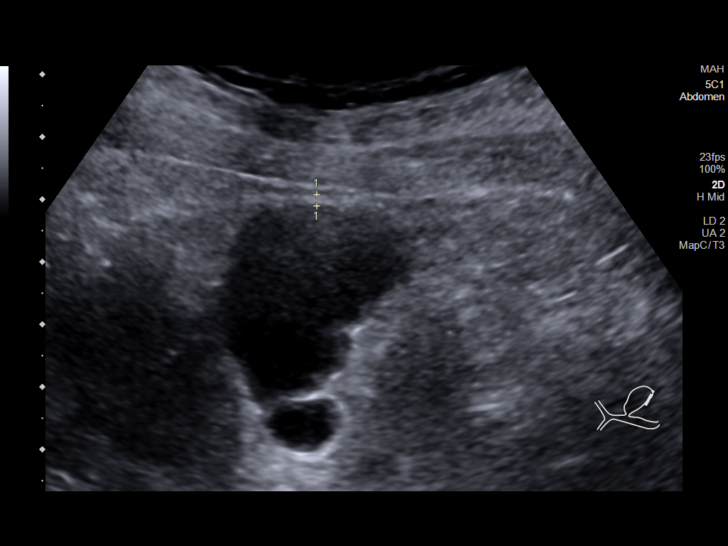
[im 15/90]
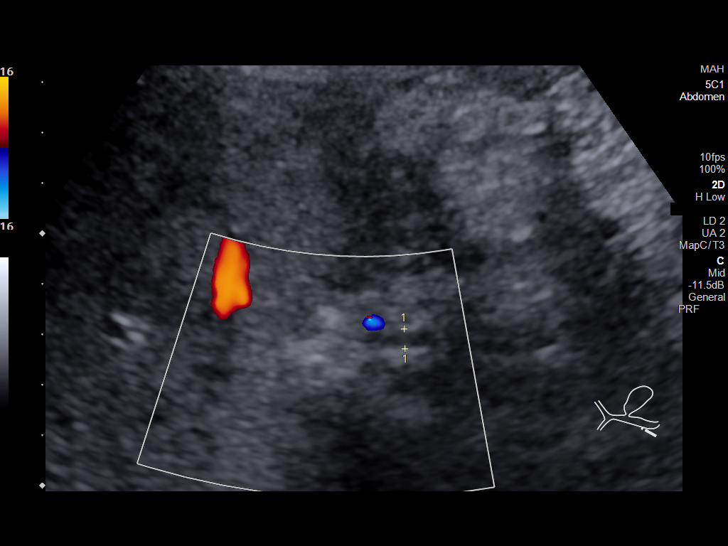
[im 23/90]
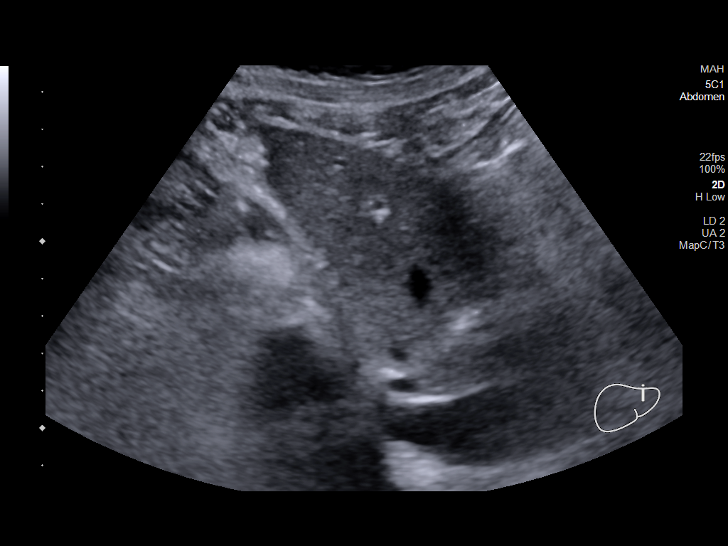
[im 30/90]
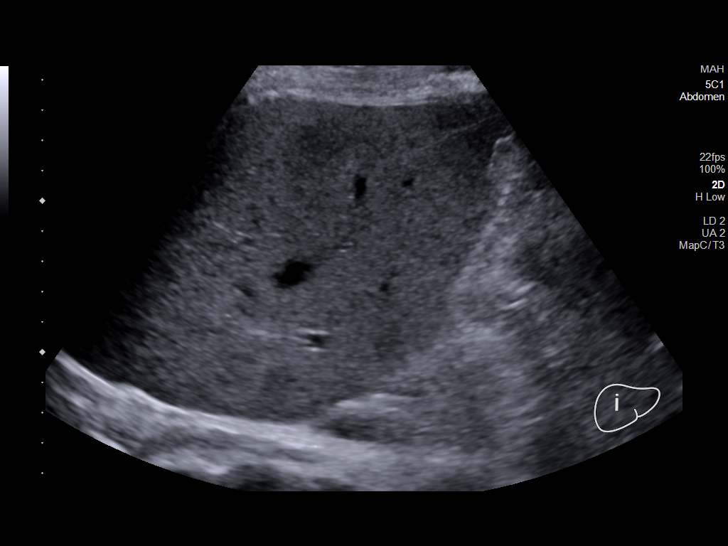
[im 34/90]
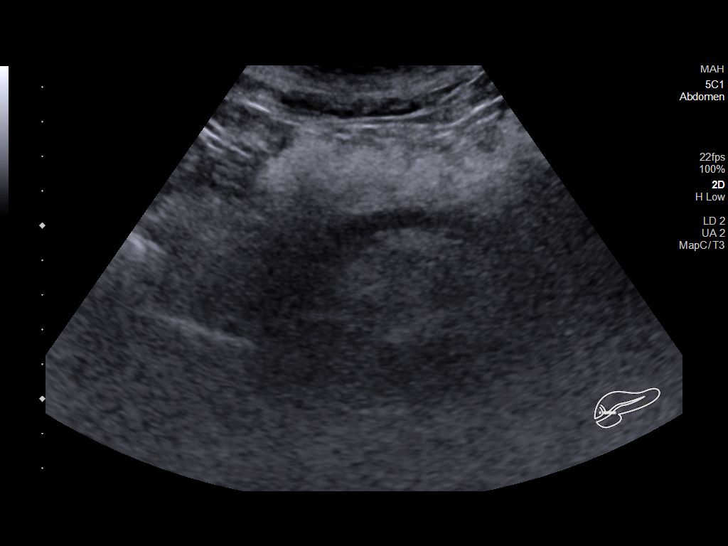
[im 41/90]
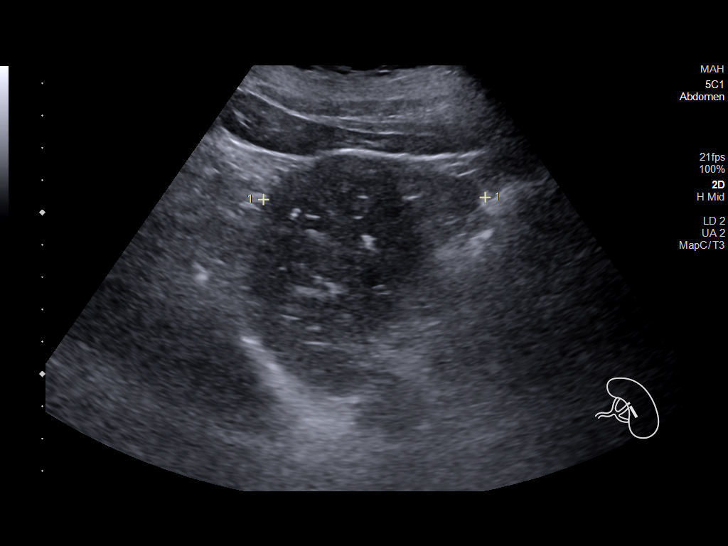
[im 49/90]
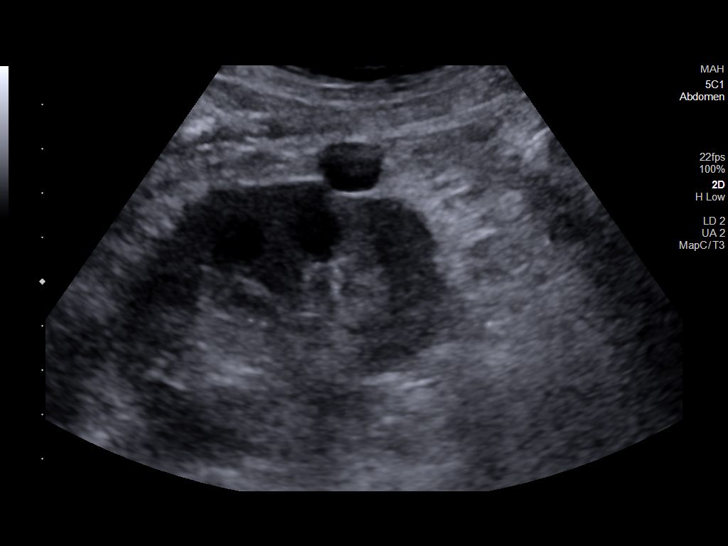
[im 56/90]
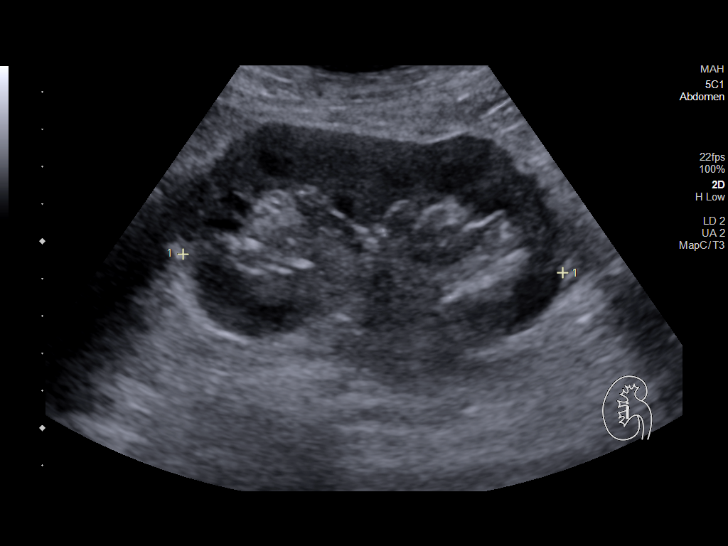
[im 60/90]
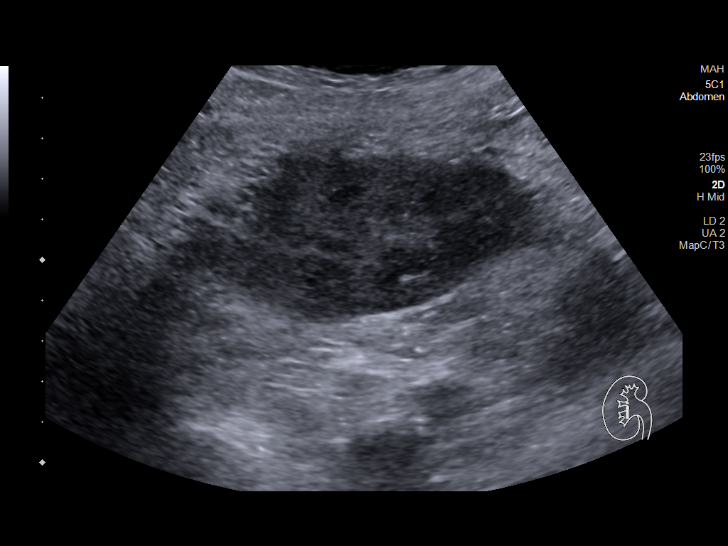
[im 67/90]
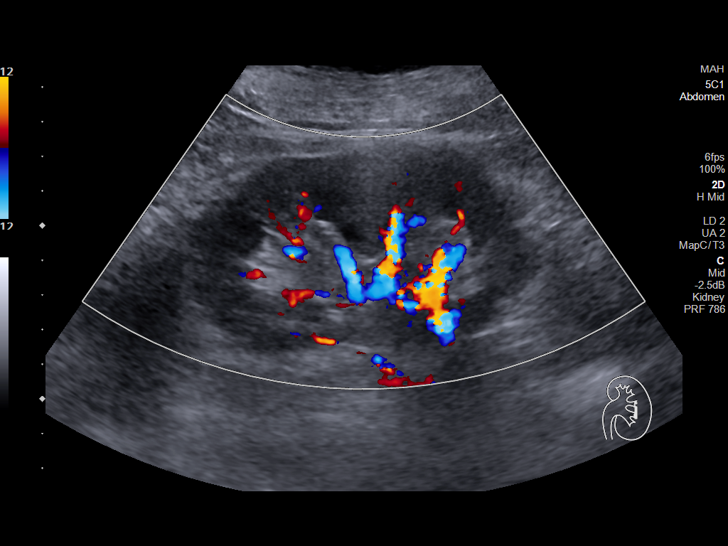
[im 75/90]
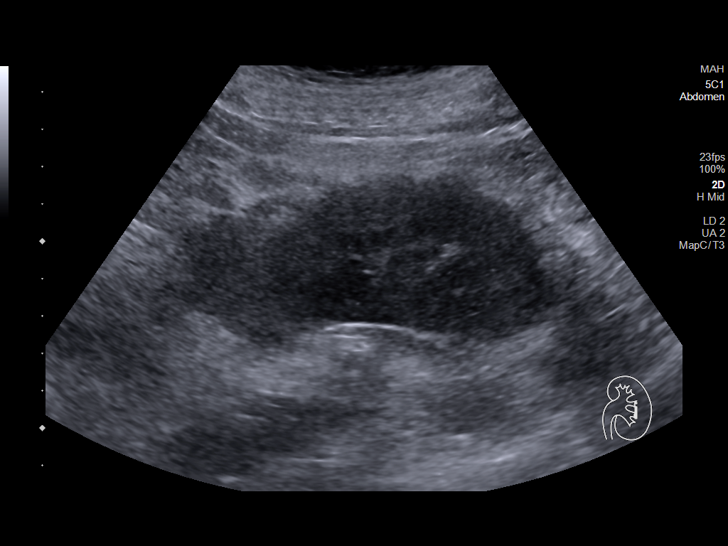
[im 82/90]
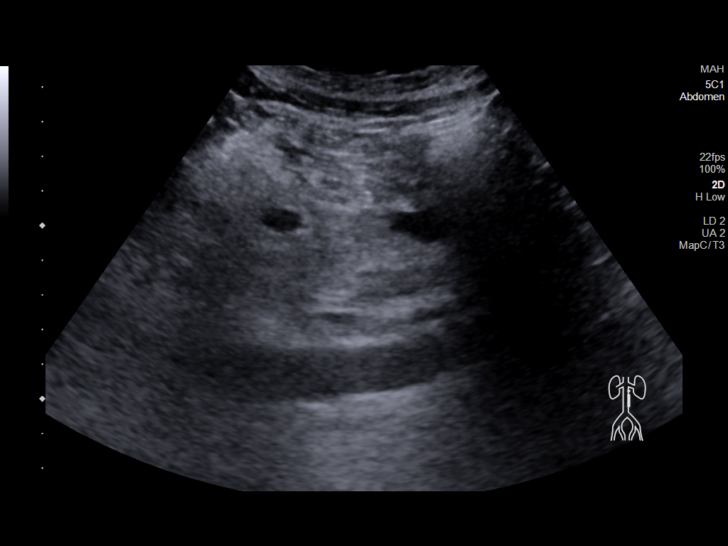
[im 90/90]
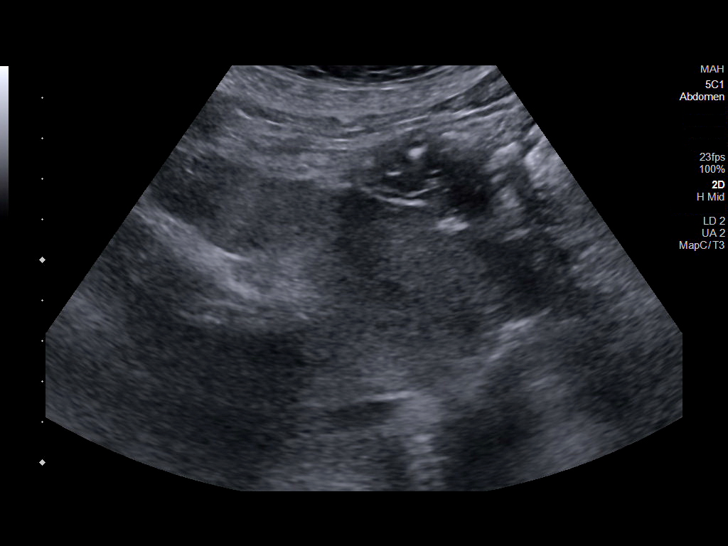

[14 of 25 positions shown; findings below may reference images not displayed]

FINDINGS: Gallbladder: No gallstones or wall thickening visualized. No
sonographic Murphy sign noted by sonographer.

Common bile duct: Diameter: 4.0 mm

Liver: No focal lesion identified. Within normal limits in
parenchymal echogenicity. Portal vein is patent on color Doppler
imaging with normal direction of blood flow towards the liver.

IVC: No abnormality visualized.

Pancreas: Poorly visualized.

Spleen: Multiple calcified granulomas in the spleen.

Right Kidney: Length: 10.2 cm. There are 2 simple renal cysts,
cm in the lower pole and exophytic cyst measuring 1.5 cm.

Left Kidney: Length: There is a 1.7 cm simple cyst in the upper
pole. Echogenicity within normal limits. No mass or hydronephrosis
visualized.

Abdominal aorta: Atherosclerosis without aneurysm visualized.

Other findings: Ingested contents noted in the stomach.
IMPRESSION: No acute sonographic abnormality in the abdomen.

## 2024-01-15 ENCOUNTER — Encounter: Payer: Self-pay | Admitting: Hematology & Oncology

## 2024-01-18 ENCOUNTER — Other Ambulatory Visit: Payer: Self-pay | Admitting: Physician Assistant

## 2024-01-18 DIAGNOSIS — W19XXXA Unspecified fall, initial encounter: Secondary | ICD-10-CM

## 2024-01-18 DIAGNOSIS — G894 Chronic pain syndrome: Secondary | ICD-10-CM

## 2024-01-18 DIAGNOSIS — M25562 Pain in left knee: Secondary | ICD-10-CM

## 2024-01-19 NOTE — Telephone Encounter (Signed)
 Appt UTD Pain contract UTD .[id number] reviewed during this encounter. No concerns Refilled.

## 2024-03-05 ENCOUNTER — Ambulatory Visit: Admitting: Physician Assistant

## 2024-04-23 ENCOUNTER — Ambulatory Visit
